# Patient Record
Sex: Female | Born: 1940
Health system: Southern US, Community
[De-identification: ages and names within clinical notes are randomized; demographics above are authoritative.]

## PROBLEM LIST (undated history)

## (undated) DIAGNOSIS — N952 Postmenopausal atrophic vaginitis: Secondary | ICD-10-CM

## (undated) DIAGNOSIS — C801 Malignant (primary) neoplasm, unspecified: Secondary | ICD-10-CM

## (undated) DIAGNOSIS — I341 Nonrheumatic mitral (valve) prolapse: Secondary | ICD-10-CM

## (undated) DIAGNOSIS — F419 Anxiety disorder, unspecified: Secondary | ICD-10-CM

## (undated) DIAGNOSIS — N816 Rectocele: Secondary | ICD-10-CM

## (undated) DIAGNOSIS — G43909 Migraine, unspecified, not intractable, without status migrainosus: Secondary | ICD-10-CM

## (undated) DIAGNOSIS — K2970 Gastritis, unspecified, without bleeding: Secondary | ICD-10-CM

## (undated) DIAGNOSIS — N6012 Diffuse cystic mastopathy of left breast: Secondary | ICD-10-CM

## (undated) DIAGNOSIS — K589 Irritable bowel syndrome without diarrhea: Secondary | ICD-10-CM

## (undated) DIAGNOSIS — D649 Anemia, unspecified: Secondary | ICD-10-CM

## (undated) DIAGNOSIS — N763 Subacute and chronic vulvitis: Secondary | ICD-10-CM

## (undated) DIAGNOSIS — Z8719 Personal history of other diseases of the digestive system: Secondary | ICD-10-CM

## (undated) DIAGNOSIS — K649 Unspecified hemorrhoids: Secondary | ICD-10-CM

## (undated) DIAGNOSIS — R0600 Dyspnea, unspecified: Secondary | ICD-10-CM

## (undated) DIAGNOSIS — Z923 Personal history of irradiation: Secondary | ICD-10-CM

## (undated) DIAGNOSIS — N6011 Diffuse cystic mastopathy of right breast: Secondary | ICD-10-CM

## (undated) DIAGNOSIS — M810 Age-related osteoporosis without current pathological fracture: Secondary | ICD-10-CM

## (undated) DIAGNOSIS — IMO0002 Reserved for concepts with insufficient information to code with codable children: Secondary | ICD-10-CM

## (undated) DIAGNOSIS — N941 Unspecified dyspareunia: Secondary | ICD-10-CM

## (undated) DIAGNOSIS — K5790 Diverticulosis of intestine, part unspecified, without perforation or abscess without bleeding: Secondary | ICD-10-CM

## (undated) DIAGNOSIS — I10 Essential (primary) hypertension: Secondary | ICD-10-CM

## (undated) DIAGNOSIS — N3010 Interstitial cystitis (chronic) without hematuria: Secondary | ICD-10-CM

## (undated) DIAGNOSIS — Z9221 Personal history of antineoplastic chemotherapy: Secondary | ICD-10-CM

## (undated) DIAGNOSIS — I499 Cardiac arrhythmia, unspecified: Secondary | ICD-10-CM

## (undated) DIAGNOSIS — E7800 Pure hypercholesterolemia, unspecified: Secondary | ICD-10-CM

## (undated) DIAGNOSIS — K5792 Diverticulitis of intestine, part unspecified, without perforation or abscess without bleeding: Secondary | ICD-10-CM

## (undated) DIAGNOSIS — C50919 Malignant neoplasm of unspecified site of unspecified female breast: Secondary | ICD-10-CM

## (undated) DIAGNOSIS — I639 Cerebral infarction, unspecified: Secondary | ICD-10-CM

## (undated) DIAGNOSIS — K635 Polyp of colon: Secondary | ICD-10-CM

## (undated) DIAGNOSIS — J45909 Unspecified asthma, uncomplicated: Secondary | ICD-10-CM

## (undated) DIAGNOSIS — K219 Gastro-esophageal reflux disease without esophagitis: Secondary | ICD-10-CM

## (undated) HISTORY — DX: Unspecified dyspareunia: N94.10

## (undated) HISTORY — DX: Interstitial cystitis (chronic) without hematuria: N30.10

## (undated) HISTORY — PX: VAGINAL HYSTERECTOMY: SUR661

## (undated) HISTORY — DX: Malignant neoplasm of unspecified site of unspecified female breast: C50.919

## (undated) HISTORY — DX: Postmenopausal atrophic vaginitis: N95.2

## (undated) HISTORY — DX: Reserved for concepts with insufficient information to code with codable children: IMO0002

## (undated) HISTORY — DX: Irritable bowel syndrome, unspecified: K58.9

## (undated) HISTORY — DX: Age-related osteoporosis without current pathological fracture: M81.0

## (undated) HISTORY — PX: TONSILLECTOMY: SUR1361

## (undated) HISTORY — PX: DILATION AND CURETTAGE OF UTERUS: SHX78

## (undated) HISTORY — PX: COLON SURGERY: SHX602

## (undated) HISTORY — PX: APPENDECTOMY: SHX54

## (undated) HISTORY — DX: Subacute and chronic vulvitis: N76.3

## (undated) HISTORY — PX: OOPHORECTOMY: SHX86

## (undated) HISTORY — PX: CORONARY ANGIOPLASTY: SHX604

## (undated) HISTORY — DX: Pure hypercholesterolemia, unspecified: E78.00

## (undated) HISTORY — DX: Gastro-esophageal reflux disease without esophagitis: K21.9

## (undated) HISTORY — PX: BREAST EXCISIONAL BIOPSY: SUR124

## (undated) HISTORY — DX: Rectocele: N81.6

---

## 1996-03-27 HISTORY — PX: BREAST BIOPSY: SHX20

## 2004-07-15 ENCOUNTER — Ambulatory Visit: Payer: Self-pay | Admitting: Surgery

## 2004-07-29 ENCOUNTER — Ambulatory Visit: Payer: Self-pay | Admitting: Internal Medicine

## 2004-07-29 ENCOUNTER — Ambulatory Visit: Payer: Self-pay | Admitting: Urology

## 2004-08-15 ENCOUNTER — Ambulatory Visit: Payer: Self-pay | Admitting: Internal Medicine

## 2004-08-25 ENCOUNTER — Ambulatory Visit: Payer: Self-pay | Admitting: Internal Medicine

## 2005-03-01 ENCOUNTER — Ambulatory Visit: Payer: Self-pay | Admitting: Surgery

## 2005-03-27 HISTORY — PX: BREAST BIOPSY: SHX20

## 2005-08-22 ENCOUNTER — Ambulatory Visit: Payer: Self-pay | Admitting: Internal Medicine

## 2005-08-25 ENCOUNTER — Ambulatory Visit: Payer: Self-pay | Admitting: Internal Medicine

## 2005-09-18 ENCOUNTER — Ambulatory Visit: Payer: Self-pay | Admitting: Surgery

## 2005-09-21 ENCOUNTER — Other Ambulatory Visit: Payer: Self-pay

## 2005-09-26 ENCOUNTER — Ambulatory Visit: Payer: Self-pay | Admitting: Surgery

## 2006-04-03 ENCOUNTER — Ambulatory Visit: Payer: Self-pay | Admitting: Cardiology

## 2006-08-24 ENCOUNTER — Ambulatory Visit: Payer: Self-pay | Admitting: Internal Medicine

## 2007-01-21 ENCOUNTER — Ambulatory Visit: Payer: Self-pay | Admitting: Surgery

## 2007-08-26 ENCOUNTER — Ambulatory Visit: Payer: Self-pay | Admitting: Surgery

## 2007-12-09 ENCOUNTER — Ambulatory Visit: Payer: Self-pay | Admitting: Internal Medicine

## 2008-07-21 ENCOUNTER — Ambulatory Visit: Payer: Self-pay | Admitting: Internal Medicine

## 2008-08-26 ENCOUNTER — Ambulatory Visit: Payer: Self-pay | Admitting: Surgery

## 2009-07-30 ENCOUNTER — Ambulatory Visit: Payer: Self-pay | Admitting: Internal Medicine

## 2009-09-06 ENCOUNTER — Ambulatory Visit: Payer: Self-pay | Admitting: Internal Medicine

## 2010-04-29 ENCOUNTER — Ambulatory Visit: Payer: Self-pay | Admitting: Surgery

## 2010-05-02 LAB — PATHOLOGY REPORT

## 2010-09-08 ENCOUNTER — Ambulatory Visit: Payer: Self-pay | Admitting: Internal Medicine

## 2011-06-12 ENCOUNTER — Observation Stay: Payer: Self-pay | Admitting: Internal Medicine

## 2011-06-12 LAB — URINALYSIS, COMPLETE
Bilirubin,UR: NEGATIVE
Glucose,UR: NEGATIVE mg/dL (ref 0–75)
Ketone: NEGATIVE
Leukocyte Esterase: NEGATIVE
Nitrite: NEGATIVE
Ph: 7 (ref 4.5–8.0)
Protein: NEGATIVE
RBC,UR: 1 /HPF (ref 0–5)
Specific Gravity: 1.004 (ref 1.003–1.030)
Squamous Epithelial: 2
WBC UR: <1 /HPF (ref 0–5)

## 2011-06-12 LAB — PROTIME-INR
INR: 0.9
Prothrombin Time: 12.4 secs (ref 11.5–14.7)

## 2011-06-12 LAB — COMPREHENSIVE METABOLIC PANEL
Albumin: 3.8 g/dL (ref 3.4–5.0)
Alkaline Phosphatase: 75 U/L (ref 50–136)
Anion Gap: 11 (ref 7–16)
BUN: 11 mg/dL (ref 7–18)
Bilirubin,Total: 0.4 mg/dL (ref 0.2–1.0)
Calcium, Total: 9 mg/dL (ref 8.5–10.1)
Chloride: 99 mmol/L (ref 98–107)
Co2: 28 mmol/L (ref 21–32)
Creatinine: 0.68 mg/dL (ref 0.60–1.30)
EGFR (African American): >60
EGFR (Non-African Amer.): >60
Glucose: 160 mg/dL — ABNORMAL HIGH (ref 65–99)
Osmolality: 278 (ref 275–301)
Potassium: 3.7 mmol/L (ref 3.5–5.1)
SGOT(AST): 47 U/L — ABNORMAL HIGH (ref 15–37)
SGPT (ALT): 40 U/L
Sodium: 138 mmol/L (ref 136–145)
Total Protein: 7.8 g/dL (ref 6.4–8.2)

## 2011-06-12 LAB — HEMOGLOBIN A1C: Hemoglobin A1C: 6.7 % — ABNORMAL HIGH (ref 4.2–6.3)

## 2011-06-12 LAB — CBC
HCT: 38.7 % (ref 35.0–47.0)
HGB: 12.9 g/dL (ref 12.0–16.0)
MCH: 28.5 pg (ref 26.0–34.0)
MCHC: 33.2 g/dL (ref 32.0–36.0)
MCV: 86 fL (ref 80–100)
Platelet: 236 10*3/uL (ref 150–440)
RBC: 4.51 10*6/uL (ref 3.80–5.20)
RDW: 14.2 % (ref 11.5–14.5)
WBC: 5.4 10*3/uL (ref 3.6–11.0)

## 2011-06-12 LAB — TROPONIN I
Troponin-I: <0.02 ng/mL
Troponin-I: <0.02 ng/mL

## 2011-06-12 LAB — CK TOTAL AND CKMB (NOT AT ARMC)
CK, Total: 98 U/L (ref 21–215)
CK, Total: 99 U/L (ref 21–215)
CK-MB: 1.5 ng/mL (ref 0.5–3.6)
CK-MB: 1.6 ng/mL (ref 0.5–3.6)

## 2011-06-13 LAB — CBC WITH DIFFERENTIAL/PLATELET
Basophil #: 0 10*3/uL (ref 0.0–0.1)
Basophil %: 0.5 %
Eosinophil #: 0.2 10*3/uL (ref 0.0–0.7)
Eosinophil %: 2.7 %
HCT: 38.4 % (ref 35.0–47.0)
HGB: 12.7 g/dL (ref 12.0–16.0)
Lymphocyte #: 2.2 10*3/uL (ref 1.0–3.6)
Lymphocyte %: 33.9 %
MCH: 28.5 pg (ref 26.0–34.0)
MCHC: 33 g/dL (ref 32.0–36.0)
MCV: 86 fL (ref 80–100)
Monocyte #: 0.6 10*3/uL (ref 0.0–0.7)
Monocyte %: 8.7 %
Neutrophil #: 3.6 10*3/uL (ref 1.4–6.5)
Neutrophil %: 54.2 %
Platelet: 226 10*3/uL (ref 150–440)
RBC: 4.45 10*6/uL (ref 3.80–5.20)
RDW: 14.3 % (ref 11.5–14.5)
WBC: 6.6 10*3/uL (ref 3.6–11.0)

## 2011-06-13 LAB — TSH: Thyroid Stimulating Horm: 2.46 u[IU]/mL

## 2011-06-13 LAB — BASIC METABOLIC PANEL
Anion Gap: 13 (ref 7–16)
BUN: 11 mg/dL (ref 7–18)
Calcium, Total: 9.2 mg/dL (ref 8.5–10.1)
Chloride: 101 mmol/L (ref 98–107)
Co2: 30 mmol/L (ref 21–32)
Creatinine: 0.7 mg/dL (ref 0.60–1.30)
EGFR (African American): >60
EGFR (Non-African Amer.): >60
Glucose: 97 mg/dL (ref 65–99)
Osmolality: 286 (ref 275–301)
Potassium: 4.1 mmol/L (ref 3.5–5.1)
Sodium: 144 mmol/L (ref 136–145)

## 2011-06-13 LAB — LIPID PANEL
Cholesterol: 196 mg/dL (ref 0–200)
HDL Cholesterol: 34 mg/dL — ABNORMAL LOW (ref 40–60)
Ldl Cholesterol, Calc: 109 mg/dL — ABNORMAL HIGH (ref 0–100)
Triglycerides: 265 mg/dL — ABNORMAL HIGH (ref 0–200)
VLDL Cholesterol, Calc: 53 mg/dL — ABNORMAL HIGH (ref 5–40)

## 2011-06-13 LAB — HEMOGLOBIN A1C: Hemoglobin A1C: 6.8 % — ABNORMAL HIGH (ref 4.2–6.3)

## 2011-06-13 LAB — MAGNESIUM: Magnesium: 1.9 mg/dL

## 2011-06-13 LAB — TROPONIN I: Troponin-I: <0.02 ng/mL

## 2011-06-13 LAB — CK TOTAL AND CKMB (NOT AT ARMC)
CK, Total: 94 U/L (ref 21–215)
CK-MB: 1.7 ng/mL (ref 0.5–3.6)

## 2011-09-19 ENCOUNTER — Ambulatory Visit: Payer: Self-pay | Admitting: Internal Medicine

## 2011-11-11 ENCOUNTER — Inpatient Hospital Stay: Payer: Self-pay | Admitting: Internal Medicine

## 2011-11-11 ENCOUNTER — Ambulatory Visit: Payer: Self-pay | Admitting: Neurology

## 2011-11-11 LAB — TROPONIN I: Troponin-I: <0.02 ng/mL

## 2011-11-11 LAB — SEDIMENTATION RATE: Erythrocyte Sed Rate: 19 mm/hr (ref 0–30)

## 2011-11-11 LAB — URINALYSIS, COMPLETE
Bacteria: NONE SEEN
Bilirubin,UR: NEGATIVE
Blood: NEGATIVE
Glucose,UR: NEGATIVE mg/dL (ref 0–75)
Ketone: NEGATIVE
Leukocyte Esterase: NEGATIVE
Nitrite: NEGATIVE
Ph: 7 (ref 4.5–8.0)
Protein: NEGATIVE
RBC,UR: 2 /HPF (ref 0–5)
Specific Gravity: 1.012 (ref 1.003–1.030)
Squamous Epithelial: 1
WBC UR: 1 /HPF (ref 0–5)

## 2011-11-11 LAB — COMPREHENSIVE METABOLIC PANEL
Albumin: 3.6 g/dL (ref 3.4–5.0)
Alkaline Phosphatase: 90 U/L (ref 50–136)
Anion Gap: 6 — ABNORMAL LOW (ref 7–16)
BUN: 11 mg/dL (ref 7–18)
Bilirubin,Total: 0.4 mg/dL (ref 0.2–1.0)
Calcium, Total: 9.2 mg/dL (ref 8.5–10.1)
Chloride: 103 mmol/L (ref 98–107)
Co2: 30 mmol/L (ref 21–32)
Creatinine: 0.79 mg/dL (ref 0.60–1.30)
EGFR (African American): >60
EGFR (Non-African Amer.): >60
Glucose: 119 mg/dL — ABNORMAL HIGH (ref 65–99)
Osmolality: 278 (ref 275–301)
Potassium: 3.9 mmol/L (ref 3.5–5.1)
SGOT(AST): 62 U/L — ABNORMAL HIGH (ref 15–37)
SGPT (ALT): 48 U/L (ref 12–78)
Sodium: 139 mmol/L (ref 136–145)
Total Protein: 7.7 g/dL (ref 6.4–8.2)

## 2011-11-11 LAB — CBC
HCT: 38.5 % (ref 35.0–47.0)
HGB: 12.7 g/dL (ref 12.0–16.0)
MCH: 27.7 pg (ref 26.0–34.0)
MCHC: 32.9 g/dL (ref 32.0–36.0)
MCV: 84 fL (ref 80–100)
Platelet: 244 10*3/uL (ref 150–440)
RBC: 4.57 10*6/uL (ref 3.80–5.20)
RDW: 14.3 % (ref 11.5–14.5)
WBC: 5.5 10*3/uL (ref 3.6–11.0)

## 2011-11-11 LAB — PROTIME-INR
INR: 0.9
Prothrombin Time: 12.3 secs (ref 11.5–14.7)

## 2011-11-11 LAB — TSH: Thyroid Stimulating Horm: 0.684 u[IU]/mL

## 2011-11-11 LAB — MAGNESIUM: Magnesium: 2 mg/dL

## 2011-11-11 LAB — APTT: Activated PTT: 31.2 secs (ref 23.6–35.9)

## 2011-11-12 LAB — CBC WITH DIFFERENTIAL/PLATELET
Basophil #: 0 10*3/uL (ref 0.0–0.1)
Basophil %: 0.3 %
Eosinophil #: 0.1 10*3/uL (ref 0.0–0.7)
Eosinophil %: 2.9 %
HCT: 34.3 % — ABNORMAL LOW (ref 35.0–47.0)
HGB: 11.1 g/dL — ABNORMAL LOW (ref 12.0–16.0)
Lymphocyte #: 1.6 10*3/uL (ref 1.0–3.6)
Lymphocyte %: 33.8 %
MCH: 27.6 pg (ref 26.0–34.0)
MCHC: 32.4 g/dL (ref 32.0–36.0)
MCV: 85 fL (ref 80–100)
Monocyte #: 0.4 x10 3/mm (ref 0.2–0.9)
Monocyte %: 8.9 %
Neutrophil #: 2.6 10*3/uL (ref 1.4–6.5)
Neutrophil %: 54.1 %
Platelet: 189 10*3/uL (ref 150–440)
RBC: 4.03 10*6/uL (ref 3.80–5.20)
RDW: 14 % (ref 11.5–14.5)
WBC: 4.7 10*3/uL (ref 3.6–11.0)

## 2011-11-12 LAB — BASIC METABOLIC PANEL
Anion Gap: 5 — ABNORMAL LOW (ref 7–16)
BUN: 12 mg/dL (ref 7–18)
Calcium, Total: 8.3 mg/dL — ABNORMAL LOW (ref 8.5–10.1)
Chloride: 109 mmol/L — ABNORMAL HIGH (ref 98–107)
Co2: 29 mmol/L (ref 21–32)
Creatinine: 0.86 mg/dL (ref 0.60–1.30)
EGFR (African American): >60
EGFR (Non-African Amer.): >60
Glucose: 112 mg/dL — ABNORMAL HIGH (ref 65–99)
Osmolality: 285 (ref 275–301)
Potassium: 3.9 mmol/L (ref 3.5–5.1)
Sodium: 143 mmol/L (ref 136–145)

## 2011-11-12 LAB — HEPATIC FUNCTION PANEL A (ARMC)
Albumin: 2.9 g/dL — ABNORMAL LOW (ref 3.4–5.0)
Alkaline Phosphatase: 78 U/L (ref 50–136)
Bilirubin, Direct: <0.1 mg/dL (ref 0.00–0.20)
Bilirubin,Total: 0.2 mg/dL (ref 0.2–1.0)
SGOT(AST): 39 U/L — ABNORMAL HIGH (ref 15–37)
SGPT (ALT): 36 U/L (ref 12–78)
Total Protein: 6.2 g/dL — ABNORMAL LOW (ref 6.4–8.2)

## 2011-11-12 LAB — LIPID PANEL
Cholesterol: 162 mg/dL (ref 0–200)
HDL Cholesterol: 34 mg/dL — ABNORMAL LOW (ref 40–60)
Ldl Cholesterol, Calc: 98 mg/dL (ref 0–100)
Triglycerides: 149 mg/dL (ref 0–200)
VLDL Cholesterol, Calc: 30 mg/dL (ref 5–40)

## 2011-11-23 ENCOUNTER — Inpatient Hospital Stay: Payer: Self-pay

## 2011-11-23 LAB — URINALYSIS, COMPLETE
Bilirubin,UR: NEGATIVE
Blood: NEGATIVE
Glucose,UR: NEGATIVE mg/dL (ref 0–75)
Ketone: NEGATIVE
Leukocyte Esterase: NEGATIVE
Nitrite: NEGATIVE
Ph: 8 (ref 4.5–8.0)
Protein: NEGATIVE
RBC,UR: 3 /HPF (ref 0–5)
Specific Gravity: 1.013 (ref 1.003–1.030)
Squamous Epithelial: 7
WBC UR: 4 /HPF (ref 0–5)

## 2011-11-23 LAB — CBC WITH DIFFERENTIAL/PLATELET
Basophil #: 0 10*3/uL (ref 0.0–0.1)
Basophil %: 0.3 %
Eosinophil #: 0.2 10*3/uL (ref 0.0–0.7)
Eosinophil %: 1.4 %
HCT: 37.4 % (ref 35.0–47.0)
HGB: 12.6 g/dL (ref 12.0–16.0)
Lymphocyte #: 1.7 10*3/uL (ref 1.0–3.6)
Lymphocyte %: 12.9 %
MCH: 28.1 pg (ref 26.0–34.0)
MCHC: 33.6 g/dL (ref 32.0–36.0)
MCV: 84 fL (ref 80–100)
Monocyte #: 1.1 x10 3/mm — ABNORMAL HIGH (ref 0.2–0.9)
Monocyte %: 8.3 %
Neutrophil #: 10.1 10*3/uL — ABNORMAL HIGH (ref 1.4–6.5)
Neutrophil %: 77.1 %
Platelet: 249 10*3/uL (ref 150–440)
RBC: 4.48 10*6/uL (ref 3.80–5.20)
RDW: 14.5 % (ref 11.5–14.5)
WBC: 13.1 10*3/uL — ABNORMAL HIGH (ref 3.6–11.0)

## 2011-11-23 LAB — COMPREHENSIVE METABOLIC PANEL
Albumin: 3.7 g/dL (ref 3.4–5.0)
Alkaline Phosphatase: 97 U/L (ref 50–136)
Anion Gap: 10 (ref 7–16)
BUN: 10 mg/dL (ref 7–18)
Bilirubin,Total: 0.5 mg/dL (ref 0.2–1.0)
Calcium, Total: 9.3 mg/dL (ref 8.5–10.1)
Chloride: 98 mmol/L (ref 98–107)
Co2: 27 mmol/L (ref 21–32)
Creatinine: 0.81 mg/dL (ref 0.60–1.30)
EGFR (African American): >60
EGFR (Non-African Amer.): >60
Glucose: 124 mg/dL — ABNORMAL HIGH (ref 65–99)
Osmolality: 271 (ref 275–301)
Potassium: 3.9 mmol/L (ref 3.5–5.1)
SGOT(AST): 31 U/L (ref 15–37)
SGPT (ALT): 37 U/L (ref 12–78)
Sodium: 135 mmol/L — ABNORMAL LOW (ref 136–145)
Total Protein: 7.7 g/dL (ref 6.4–8.2)

## 2011-11-23 LAB — LIPASE, BLOOD: Lipase: 171 U/L (ref 73–393)

## 2011-11-24 LAB — CBC WITH DIFFERENTIAL/PLATELET
Basophil #: 0 10*3/uL (ref 0.0–0.1)
Basophil %: 0.3 %
Eosinophil #: 0.2 10*3/uL (ref 0.0–0.7)
Eosinophil %: 2.4 %
HCT: 34.4 % — ABNORMAL LOW (ref 35.0–47.0)
HGB: 11.1 g/dL — ABNORMAL LOW (ref 12.0–16.0)
Lymphocyte #: 2 10*3/uL (ref 1.0–3.6)
Lymphocyte %: 25.5 %
MCH: 27.5 pg (ref 26.0–34.0)
MCHC: 32.3 g/dL (ref 32.0–36.0)
MCV: 85 fL (ref 80–100)
Monocyte #: 0.6 x10 3/mm (ref 0.2–0.9)
Monocyte %: 8.2 %
Neutrophil #: 5 10*3/uL (ref 1.4–6.5)
Neutrophil %: 63.6 %
Platelet: 187 10*3/uL (ref 150–440)
RBC: 4.04 10*6/uL (ref 3.80–5.20)
RDW: 14.2 % (ref 11.5–14.5)
WBC: 7.9 10*3/uL (ref 3.6–11.0)

## 2011-11-29 LAB — CULTURE, BLOOD (SINGLE)

## 2012-01-18 ENCOUNTER — Ambulatory Visit: Payer: Self-pay | Admitting: Cardiothoracic Surgery

## 2012-02-28 ENCOUNTER — Other Ambulatory Visit: Payer: Self-pay | Admitting: Gastroenterology

## 2012-02-28 LAB — CLOSTRIDIUM DIFFICILE BY PCR

## 2012-03-27 ENCOUNTER — Ambulatory Visit: Payer: Self-pay | Admitting: Cardiothoracic Surgery

## 2012-04-18 ENCOUNTER — Ambulatory Visit: Payer: Self-pay | Admitting: Cardiothoracic Surgery

## 2012-04-27 ENCOUNTER — Ambulatory Visit: Payer: Self-pay | Admitting: Cardiothoracic Surgery

## 2012-09-19 ENCOUNTER — Ambulatory Visit: Payer: Self-pay | Admitting: Internal Medicine

## 2012-10-10 ENCOUNTER — Ambulatory Visit: Payer: Self-pay | Admitting: Cardiothoracic Surgery

## 2012-10-15 ENCOUNTER — Ambulatory Visit: Payer: Self-pay | Admitting: Cardiothoracic Surgery

## 2013-04-10 ENCOUNTER — Ambulatory Visit: Payer: Self-pay | Admitting: Cardiothoracic Surgery

## 2013-09-24 ENCOUNTER — Ambulatory Visit: Payer: Self-pay | Admitting: Internal Medicine

## 2013-10-14 ENCOUNTER — Ambulatory Visit: Payer: Self-pay | Admitting: Internal Medicine

## 2014-02-02 DIAGNOSIS — K589 Irritable bowel syndrome without diarrhea: Secondary | ICD-10-CM | POA: Insufficient documentation

## 2014-05-05 DIAGNOSIS — I341 Nonrheumatic mitral (valve) prolapse: Secondary | ICD-10-CM | POA: Diagnosis not present

## 2014-05-05 DIAGNOSIS — M542 Cervicalgia: Secondary | ICD-10-CM | POA: Diagnosis not present

## 2014-05-05 DIAGNOSIS — E782 Mixed hyperlipidemia: Secondary | ICD-10-CM | POA: Diagnosis not present

## 2014-05-05 DIAGNOSIS — N816 Rectocele: Secondary | ICD-10-CM | POA: Diagnosis not present

## 2014-05-26 DIAGNOSIS — N8110 Cystocele, unspecified: Secondary | ICD-10-CM | POA: Diagnosis not present

## 2014-05-26 DIAGNOSIS — N3946 Mixed incontinence: Secondary | ICD-10-CM | POA: Diagnosis not present

## 2014-05-26 DIAGNOSIS — N816 Rectocele: Secondary | ICD-10-CM | POA: Diagnosis not present

## 2014-05-26 DIAGNOSIS — N941 Dyspareunia: Secondary | ICD-10-CM | POA: Diagnosis not present

## 2014-06-09 DIAGNOSIS — M62838 Other muscle spasm: Secondary | ICD-10-CM | POA: Diagnosis not present

## 2014-06-09 DIAGNOSIS — N952 Postmenopausal atrophic vaginitis: Secondary | ICD-10-CM | POA: Diagnosis not present

## 2014-06-09 DIAGNOSIS — N941 Dyspareunia: Secondary | ICD-10-CM | POA: Diagnosis not present

## 2014-06-15 DIAGNOSIS — Z79899 Other long term (current) drug therapy: Secondary | ICD-10-CM | POA: Diagnosis not present

## 2014-06-15 DIAGNOSIS — M47817 Spondylosis without myelopathy or radiculopathy, lumbosacral region: Secondary | ICD-10-CM | POA: Diagnosis not present

## 2014-06-15 DIAGNOSIS — M545 Low back pain: Secondary | ICD-10-CM | POA: Diagnosis not present

## 2014-06-15 DIAGNOSIS — E782 Mixed hyperlipidemia: Secondary | ICD-10-CM | POA: Diagnosis not present

## 2014-06-15 DIAGNOSIS — M533 Sacrococcygeal disorders, not elsewhere classified: Secondary | ICD-10-CM | POA: Diagnosis not present

## 2014-06-23 DIAGNOSIS — M62838 Other muscle spasm: Secondary | ICD-10-CM | POA: Diagnosis not present

## 2014-06-23 DIAGNOSIS — N941 Dyspareunia: Secondary | ICD-10-CM | POA: Diagnosis not present

## 2014-06-23 DIAGNOSIS — N952 Postmenopausal atrophic vaginitis: Secondary | ICD-10-CM | POA: Diagnosis not present

## 2014-06-23 DIAGNOSIS — M791 Myalgia: Secondary | ICD-10-CM | POA: Diagnosis not present

## 2014-07-14 NOTE — Discharge Summary (Signed)
PATIENT NAME:  Doris Barnes, Doris Barnes MR#:  [id number] DATE OF BIRTH:  [birth year]  DATE OF ADMISSION:  11/11/2011 DATE OF DISCHARGE:  11/12/2011  FINAL DIAGNOSES:  1. Benign paroxysmal positional vertigo.  2. Hypertension.  3. Gastroesophageal reflux disease.  4. Hyperlipidemia.  5. Anxiety.   HISTORY AND PHYSICAL: Please see dictated admission history and physical.   [place]: The patient was admitted with findings of vertigo and evidence on CT scan of right cerebellar cerebrovascular infarction. She had had evaluation including echocardiogram, carotid Doppler's, and MRA as well as MRI of the brain in March 2013 when she came in with similar symptoms, and these were all negative. She underwent MRI to further investigate the new findings on CT, and there was no evidence of acute stroke on the MRI. There was also no significant evidence of small vessel disease or any other potential transient ischemic attacks or stroke etiology. MRA of the neck was also performed, which showed no significant lesions in the carotids. She was followed on monitor, and no significant rhythm problems were noted.   Neurology saw the patient and re-evaluated her CT scan. They felt that this was likely artifact and that she had not had a stroke which was discussed further with the patient. Physical therapy saw the patient and did perform Dix-Hallpike maneuvers versus potential benign paroxysmal positional vertigo, and these were positive for vertiginous findings. The patient was ambulating with a walker and felt significantly improved with the use of meclizine as needed. We will plan for her to follow-up with physical therapy as an outpatient to do vestibular rehabilitation. At this time she is discharged home in stable condition with physical activity to be up with a walker as needed and as tolerated. She will follow up with Dr. Ginette Pitman within the next one week. Her diet can be regular, eventually will reduce this to  low sodium once it is clear that she is taking oral intake better.   DISCHARGE MEDICATIONS:  1. Aspirin 81 mg p.o. daily.  2. Atenolol 50 mg p.o. daily.  3. Simvastatin 40 mg p.o. at bedtime.  4. Prevacid 30 mg p.o. daily.     ____________________________ Adin Hector, MD bjk:ap D: 11/12/2011 15:35:22 ET T: 11/13/2011 11:32:10 ET JOB#: [id number]  cc: Tama High III, MD, <Dictator> Ramonita Lab MD ELECTRONICALLY SIGNED 11/14/2011 13:23

## 2014-07-14 NOTE — Consult Note (Signed)
PATIENT NAME:  AMANDEEP, HOGSTON MR#:  [id number] DATE OF BIRTH:  [birth year]  DATE OF CONSULTATION:  11/11/2011  REFERRING PHYSICIAN:   Dr. Tracie Harrier CONSULTING PHYSICIAN:  Vitor Overbaugh B. Jamari Moten, MD  REASON FOR CONSULTATION: Stroke.    HISTORY OF PRESENT ILLNESS: Ms. Kary is a 74 year old, right-handed female with a past medical history significant for hypertension and hyperlipidemia who presents this morning with the sudden onset of vertigo, nausea, and gait instability. She has had several episodes similar to this in the past. The episodes in the past were shorter and not as severe as the one today. Her first episode was in March 2013 and she was admitted here for work-up for that. At that time she had an MRI of her brain, MRA of the head, carotid Dopplers and echocardiogram, which were all unremarkable. Since that time she has had a few other episodes, but they lasted only a matter of minutes. The episode today was much more severe and has lasted all day. In the Emergency Department she received antiemetic and Antivert and this has been helpful. She denies any recent trauma or falls. Of note, she does experience frequent heart palpitations and heart fluttering. She did have an outpatient Holter monitor few months ago. The result of this is unclear. She said that she did not experience any of the palpitations during that recording period. Head CT in the Emergency Department showed a right cerebellar hypodensity and she was admitted for further work-up.   REVIEW OF SYSTEMS: A complete 14-point review of systems is negative other than what is mentioned in the history of present illness.   PAST MEDICAL HISTORY: Hypertension, hyperlipidemia, irritable bowel syndrome, interstitial cystitis, migraines, history of squamous cell carcinoma of the nose, history of osteoporosis, status post hysterectomy with bilateral salpingo-oophorectomy, history of appendectomy.   ALLERGIES: Sulfa, imipramine?, Lipitor,  Keflex, codeine, epinephrine, Reglan.   HOME MEDICATIONS:  1. Aspirin 81 mg  daily.  2. Calcium.  3. Atenolol.  4. Simvastatin 20 mg at bedtime.  5. Prevacid.   SOCIAL HISTORY: She quit smoking in 1989. She denies alcohol or illicits. She lives with her husband.   FAMILY HISTORY: Diabetes, colon cancer, and heart failure.   PHYSICAL EXAMINATION:  VITAL SIGNS: Temperature 96, pulse 81, blood pressure 160/85, weight 58.5 kg.   GENERAL: Well, no apparent distress.   CARDIOVASCULAR: Regular rate and rhythm.   RESPIRATORY: Clear to auscultation anteriorly.   ABDOMEN: Soft.   SKIN: No rashes or lesions.   EXTREMITIES: No edema. 2+ pulses throughout.   MENTAL STATUS: . She is alert and oriented times four, name repetition intact, speech is fluent, no dysarthria.   Cranial nerves: Pupils equal, round, and reactive to light. Full visual fields. Extraocular movements are intact. No nystagmus. No facial sensory deficits. Facial expressions are symmetric. Shoulder shrug is five out of five bilaterally. Tongue midline.   Motor: No pronator drift. Strength is five out of five throughout.   Deep tendon reflexes:  2+/4 throughout with toes downgoing bilaterally.   Coordination: Very mild ataxia with finger-to-nose on the right. Left finger-to-nose is normal. Heel-to-shin is normal bilaterally.  Sensation: No deficits to light touch or temperature throughout.   Gait: Her gait is mildly ataxic and unsteady at times. She is able to ambulate unassisted.   IMAGING: Noncontrast head CT shows two small right cerebellar hypodensities.   LABORATORY DATA:  BMP, LFTs, and CBC within normal limits.  Troponin is negative.   ASSESSMENT AND PLAN: Ms. Tanveer Dobberstein  is a 74 year old female with a past medical history significant for hypertension and hyperlipidemia who presents with an acute right cerebellar ischemic stroke. Her risk factors for stroke include hypertension and hyperlipidemia.    RECOMMENDATIONS:   1. Obtain MRI of the brain without contrast.  2. Obtain MRA of the neck with fat suppression to rule out dissection.  3. Check a lipid panel, homocystine, and lipoprotein A.  4. Continue on telemetry while an inpatient.  5. Recommend repeat outpatient Holter monitor since the last episode of Holter monitoring did not  include her episodes of palpitations or fluttering.  6. Physical and occupational therapy.  7. Bedside swallow evaluation.  8. Continue Antivert p.r.n.   9. She is probably at low risk to develop cerebral edema. However, patients with cerebellar strokes can decline rapidly if cerebral edema does develop. Recommend frequent neuro checks. Obtain stat head CT and page neurology for any change in mental status or any change in neuro exam.  10. Agree with changing aspirin to Plavix.  11. Increase simvastatin to 40 mg daily.   Thank you for this consultation. We will continue to follow.  ____________________________ Micheline Maze Aalaysia Liggins, MD cbs:bjt D: 11/11/2011 13:52:24 ET T: 11/11/2011 14:53:00 ET JOB#: [id number]  cc: Jiles Goya B. Steele Berg, MD, <Dictator> Arelia Sneddon MD ELECTRONICALLY SIGNED 11/12/2011 12:23

## 2014-07-14 NOTE — Discharge Summary (Signed)
PATIENT NAME:  Doris Barnes, Doris Barnes MR#:  [id number] DATE OF BIRTH:  09/09/1940  DATE OF ADMISSION:  11/23/2011 DATE OF DISCHARGE:  11/25/2011  PRIMARY CARE PHYSICIAN: Dr. Tracie Harrier    DISCHARGE DIAGNOSES:  1. Acute diverticulitis.  2. BPPV.  3. Pulmonary nodules 4. Hyperlipidemia.  5. History of irritable bowel syndrome.  6. Interstitial cystitis.  7. Migraine headache.  8. Osteoporosis.  9. Hypertension.  PAST SURGICAL HISTORY: Hysterectomy.  DISCHARGE MEDICATIONS:  1. Metronidazole 500 mg t.i.d.  2. Ciprofloxacin 500 mg b.i.d.  3. Zofran 4 mg q. 6 hours p.r.n.  4. Xanax 0.25 mg at bedtime p.r.n.  5. Atenolol 50 mg daily.  6. Fluoxetine 20 mg daily.  7. HCTZ 12.5 mg daily.  8. Aspirin 81 mg daily.  9. Simvastatin 40 mg daily.  10. Prevacid 30 mg daily.   HISTORY OF PRESENT ILLNESS: This is a 74 year old female who presented to the ED with lower abdominal pain for the prior 24 hours with associated nausea. She had had no fever or diarrhea. CT scan performed in the ED showed acute diverticulitis.   HOSPITAL COURSE:  1. Acute diverticulitis. The patient was admitted and started on IV antibiotics, IV fluids, and given as needed antiemetics and analgesics. Her pain improved in the first 24 hours and nausea also improved. IV antibiotics were stopped and she was placed on oral antibiotics and her diet was advanced. She tolerated this as well.  2. Dizziness. She had chronic dizziness with a history of BPPV. She had been following with neurology and ENT for this problem. She had ongoing dizziness throughout the admission. She expressed concern that she would not be able to return to work due to her ongoing dizziness and nausea. She had otherwise uneventful course.  3. Pulmonary nodules. Her CT scan showed some incidental pulmonary nodules, the largest of which measured 4 millimeters. It is recommended a repeat imaging study be performed in 6 to 12 months.     RADIOLOGY/LABORATORY  DATA:  CT abdomen/pelvis on 11/23/2011 showed incidental small 2 to 3-mm nodules in the right lower lobe of the lung as well as a 4-mm nodule in the lingula. Mild thickening of the gastric wall was likely secondary to underdistention. Diverticulosis was present in the sigmoid colon with mild adjacent inflammatory changes. Mild bowel wall thickening of the sigmoid colon was present. Findings were consistent with acute sigmoid diverticulitis.     08/30/32013 laboratory:  WBC 7.9, hemoglobin 11.1, hematocrit 34.4. Blood cultures negative times two. Urinalysis negative.    DISCHARGE INSTRUCTIONS:  1. Take antibiotics as written for the next nine days to complete a total of a 10-day course.  2. Zofran to be used intermittently as needed for nausea.  3. Xanax to be used intermittently as needed for poor sleep and anxiety.  4. Follow up with primary care provider for pulmonary nodules and ongoing dizziness as well as potential need for repeat colonoscopy. It should be noted that she had a colonoscopy within the last year by Dr. Tamala Julian, which did show diverticulosis.  5. Follow up with ENT as scheduled on 11/28/2011 for further treatment of BPPV.  6. Recommend she not return to work until after her next evaluation on 11/28/2011 with ENT.  ____________________________ A. Lavone Orn, MD ams:bjt D:  11/25/2011 11:05:40 ET          T: 11/28/2011 10:43:11 ET        JOB#: [id number]  cc: [facility] Tracie Harrier, MD Sherlon Handing MD ELECTRONICALLY  SIGNED 11/29/2011 17:22

## 2014-07-14 NOTE — H&P (Signed)
PATIENT NAME:  Doris Barnes, Doris Barnes MR#:  [id number] DATE OF BIRTH:  12-Jun-1940  DATE OF ADMISSION:  11/23/2011  REFERRING PHYSICIAN: ER physician, Dr. Owens Shark   PRIMARY CARE PHYSICIAN: Dr. Ginette Pitman   CHIEF COMPLAINT: Nausea, abdominal pain.   HISTORY OF PRESENT ILLNESS: The patient is a 74 year old female with past medical history of hypertension, hyperlipidemia, and IBS who had a colonoscopy about a year ago by Dr. Rochel Brome which showed diverticulosis. She recently had some nuts and popcorn and subsequently developed lower abdominal pain which got really worse last night described as stabbing pain in her left lower quadrant associated with some nausea. She denied any fever or diarrhea. She came to the ER for evaluation and was found to have acute diverticulitis on CAT scan. She is being admitted to the hospital for further management.   PAST MEDICAL HISTORY:  1. Hypertension. 2. Hyperlipidemia. 3. Irritable bowel syndrome.   4. Interstitial cystitis. 5. Migraine.  6. Squamous cell cancer of the nose.  7. Osteoporosis.  8. Diverticulosis per colonoscopy done a year ago by Dr. Tamala Julian.   PAST SURGICAL HISTORY:  1. Hysterectomy with bilateral salpingo-oophorectomy. 2. Appendectomy.   ALLERGIES: Sulfa, imipramine, Lipitor, Keflex, codeine, epinephrine, Reglan.   CURRENT MEDICATIONS:  1. Aspirin 81 mg daily.  2. Melatonin 2 to 3 mg at bedtime for insomnia.  3. Fluoxetine 20 mg daily.  4. HCTZ 12.5 mg daily. 5. Calcium 500 mg daily.  6. Atenolol 50 mg daily.  7. Simvastatin 20 mg daily.  8. Prevacid 30 mg daily.   SOCIAL HISTORY: The patient quit smoking several years ago. There is no history of alcohol or drug abuse. She is married, lives with her husband.   FAMILY HISTORY: Positive for diabetes, colon cancer, and heart failure.   REVIEW OF SYSTEMS: CONSTITUTIONAL: Denies any fever, fatigue, weakness. EYES: Denies any blurred or double vision. ENT: Denies any tinnitus, ear pain.  RESPIRATORY: Denies any cough, wheezing. CARDIOVASCULAR: Denies any chest pain, palpitations. GI: Reports nausea and left lower quadrant abdominal pain. Denies any diarrhea. GU: Denies any dysuria or hematuria. ENDOCRINE: Denies any polyuria or nocturia. HEME/LYMPH: Denies any anemia or easy bruisability. MUSCULOSKELETAL: Denies any swelling, gout. NEUROLOGICAL: Denies any numbness, weakness. PSYCH: Has history of anxiety, depression.   PHYSICAL EXAMINATION:   VITAL SIGNS: Temperature 97.8, heart rate 90, respiratory rate 18, blood pressure 134/69, pulse oximetry 95% on room air.   GENERAL: The patient is a 74 year old Caucasian female who is sitting in bed not in acute distress.   HEAD: Atraumatic, normocephalic.   EYES: There is no pallor, icterus, or cyanosis. Pupils equal, round, and reactive to light and accommodation. Extraocular movements intact.    ENT: Wet mucous membranes. No oropharyngeal erythema or thrush.   NECK: Supple. No masses. No JVD. No thyromegaly or lymphadenopathy.   CHEST WALL: No tenderness to palpation. Not using accessory muscles of respiration. No intercostal muscle retractions.   LUNGS: Bilaterally clear to auscultation. No wheezing, rales, or rhonchi.   CARDIOVASCULAR: S1, S2 regular. No murmurs, rubs, or gallops.    ABDOMEN: Soft with no guarding or rigidity. Normal bowel sounds. The patient has tenderness to palpation in her left lower quadrant.   SKIN: No rashes or lesions.   PERIPHERIES: No pedal edema. 2+ pedal pulses.   MUSCULOSKELETAL: No cyanosis, no clubbing.   NEUROLOGIC: Awake, alert, oriented x3. Nonfocal neurological exam. Cranial nerves grossly intact.   PSYCH: Normal mood and affect.   LABORATORY, DIAGNOSTIC, AND RADIOLOGICAL DATA: CT of  the abdomen shows acute sigmoid diverticulitis, multiple indeterminate pulmonary nodules the largest of which measures 4 mm.  Urinalysis shows no evidence of infection. White count of 13.1, hemoglobin  12.6, hematocrit 37.4, platelet count 249. Lipase 171. Glucose 124, BUN 10, creatinine 0.81, sodium 135, potassium 3.9. Rest of CMP is normal.   ASSESSMENT AND PLAN: This is a 74 year old female with past medical history of hypertension, hyperlipidemia, and migraines who presents with left lower quadrant pain and nausea.  1. Acute sigmoid diverticulosis colitis possibly precipitated by the patient's recent intake of popcorn and nuts. Will admit the patient to the hospital. Start her on a clear liquid diet. IV fluids provided with p.r.n. antiemetics and analgesic medications. Will start her on empiric antibiotics of Cipro and Flagyl. If the patient's condition does not improve, will consider GI or surgical evaluation.  2. Leukocytosis possibly due to above. Will monitor closely.  3. Hyperglycemia, possibly mild and reactive.  4. Lung nodules on CT of the abdomen. The patient will benefit from outpatient surveillance CT in 3 to 6 months.  5. Hypertension, appears to be controlled at present. Will continue the patient's HCTZ and atenolol.  6. Hyperlipidemia. Continue statin therapy.  7. History of gastroesophageal reflux disease. Continue PPI.  8. History of depression. Continue fluoxetine.   Reviewed all medical records, discussed with the patient the plan of care and management.   TIME SPENT: 75 minutes.   ____________________________ Cherre Huger, MD sp:drc D: 11/23/2011 11:21:33 ET T: 11/23/2011 11:41:43 ET JOB#: [id number]  cc: Cherre Huger, MD, <Dictator> Tracie Harrier, MD  Cherre Huger MD ELECTRONICALLY SIGNED 11/23/2011 12:19

## 2014-07-14 NOTE — H&P (Signed)
PATIENT NAME:  Doris Barnes, Doris Barnes MR#:  [id number] DATE OF BIRTH:  [birth year]  DATE OF ADMISSION:  11/11/2011  CHIEF COMPLAINT: Dizziness and vertigo.   HISTORY OF PRESENT ILLNESS: 74 year old female with a history of hypertension and hyperlipidemia who was hospitalized in March 2013 with complaints of dizziness. At that time she underwent MRI, MRA, carotid Dopplers, and echocardiogram, all of which were unremarkable. Over the last couple of days she has had some episodes of dizziness, worse when she bends forward, usually worse in the morning, which then improve over the course of the day. This morning she had severe dizziness which was associated with nausea, vomiting, vertigo, and presented to the Emergency Room. She underwent CT scan which reveals a new right cerebellar low attenuation lesion, consistent with acute versus subacute cerebral infarction. She was given Zofran and Antivert, with some improvement. She still feels somewhat dizzy. She is admitted now with acute cerebrovascular accident in a patient already on aspirin who has had extensive evaluation for prior transient ischemic attack symptoms.   PAST MEDICAL HISTORY:  1. Hypertension.  2. Hyperlipidemia.  3. Irritable bowel syndrome.  4. Interstitial cystitis.  5. Migraines.  6. History of squamous cell carcinoma of the nose.  7. History of osteoporosis.  8. Status post hysterectomy with bilateral salpingo-oophorectomy.  9. History of appendectomy.   ALLERGIES: Sulfa, imipramine, Lipitor, Keflex, codeine, epinephrine, Reglan.   MEDICATIONS:  1. Aspirin 81 mg p.o. daily.  2. Calcium 500 mg p.o. daily. 3. Atenolol 50 mg p.o. daily.  4. Simvastatin 20 mg p.o. at bedtime.  5. Prevacid 30 mg p.o. daily.   SOCIAL HISTORY: Remote tobacco. No alcohol.   FAMILY HISTORY: Diabetes, colon cancer, heart failure.   REVIEW OF SYSTEMS: Please see history of present illness. Felt chilled. No fevers. Generalized malaise. Some headache, better  now. No neck stiffness. Denies chest pain. Occasional palpitations. Remainder of complete review of systems is negative.   PHYSICAL EXAMINATION:  VITAL SIGNS: Temperature 96, pulse 81, blood pressure 160/85,  weight 58.5 kg.   GENERAL: Thin female, in no distress.   EYES: Pupils round and reactive to light. Lids and lids conjunctivae unremarkable. Extraocular motion is intact, without nystagmus.   EAR, NOSE, AND THROAT: TMs clear bilaterally. Oropharynx moist without lesions. No facial droop.   NECK: Supple. Trachea midline. No thyromegaly.   CARDIOVASCULAR: Regular rate and rhythm without murmurs, gallops, or rubs. Carotid and radial pulses 2+.   LUNGS: Clear bilaterally. No retractions.   ABDOMEN: Soft, nontender, nondistended. Positive bowel sounds. No guard or rebound.   SKIN: No significant rashes or nodules.   LYMPH NODES: No cervical or supraclavicular nodes.   MUSCULOSKELETAL: No clubbing or cyanosis. No edema.   NEUROLOGIC: Cranial nerves appear to be intact. May be minimal left pronator drift. Finger-to-nose intact. Heel-to-shin is also intact, but somewhat more unsteady with right heel to left leg. Motor strength appears symmetrical otherwise and gait is not tested.  LABORATORY, DIAGNOSTIC, AND RADIOLOGICAL DATA: Cardiac enzymes negative. CT reveals a low attenuation lesion within the right cerebellum, as noted above. Glucose 119, creatinine 0.79. AST 62. Remainder of liver enzymes normal. White count normal.   IMPRESSION AND PLAN:  1. Acute cerebellar stroke. Would not repeat her MRA, carotid Dopplers, or echocardiogram, given these were recently done. However, we will repeat MRI to further evaluate the lesion. I will ask neurology to see the patient as well.  Change aspirin to Plavix. Attempt to increase simvastatin to 40 mg to further lower  LDL, which has remained greater than 100, and discussed potential side effects similar to what she had with Lipitor, which was  gastrointestinal in nature, and she is comfortable trying this. Allow blood pressure to run slightly high for now. Continue her home atenolol dose. We will send TSH, ESR, and phospholipid syndrome looking for any other potential sources for her higher blood pressure, hypercoagulable state, etc.  2. Hyperglycemia. Following this for now. No history of diabetes.  3. Elevated liver enzymes. We will repeat in the a.m. and determine whether further work-up is needed.     ____________________________ Adin Hector, MD bjk:bjt D: 11/11/2011 11:26:33 ET T: 11/11/2011 11:58:27 ET JOB#: [id number]  cc: Adin Hector, MD, <Dictator> Ramonita Lab MD ELECTRONICALLY SIGNED 11/14/2011 13:23

## 2014-07-19 NOTE — H&P (Signed)
PATIENT NAME:  Doris Barnes, BOUTIN MR#:  [id number] DATE OF BIRTH:  [birth year]  DATE OF ADMISSION:  06/12/2011  PRIMARY CARE PHYSICIAN:  Dr Ginette Pitman   REFERRING PHYSICIAN: ER physician,  Dr Randie Heinz  CHIEF COMPLAINT: Difficulty walking.   HISTORY OF PRESENT ILLNESS: The patient is a 74 year old female with past medical history of hypertension, hyperlipidemia, gastroesophageal reflux disease, irritable bowel syndrome and occasional benign positional vertigo who was in her usual state of health until yesterday when she got up to get ready to go to church and felt funny. She reports that she does not feel funny but every time she tries to try to walk her head feel funny. She has been staggering. She did not fall because she uses the assistance of all wall. Her husband noticed that she was stumbling to the left side. She reports problems with coordination and balance. She denies any visual changes or physical upper extremity and lower extremity weakness. She does report increasing fatigue in the last few days. She also reports left-sided neck pain for the past few days.   ALLERGIES: Epinephrine. Reglan, sulfa.   PAST MEDICAL HISTORY:  1. Hypertension.  2. Hyperlipidemia.  3. Gastroesophageal reflux disease. 4. Irritable bowel syndrome. 5. Benign positional vertigo. 6. History of diverticulosis, internal hemorrhoids.   PAST SURGICAL HISTORY:  1. Hysterectomy.  2. Appendectomy. 3. Left breast mass status post excision. 4. Colonoscopy.  MEDICATIONS:  Aspirin 81 mg every day, atenolol 50 mg daily, Calcium with vitamin D 1 tablet b.i.d., fluoxetine 20 mg daily. HCTZ 12.5 mg daily. Prevacid 30 mg daily. Refresh  two drops each eye as needed. Simvastatin 20 mg daily. Tylenol p.r.n.   SOCIAL HISTORY: The patient quit smoking more than 20 years ago. She denies alcohol or drug abuse. She works as a [employer]arry manager. She is married and lives with her husband.   FAMILY HISTORY: Mother had some kind of interstitial  lung disease. Father had emphysema and died of an MI.   REVIEW OF SYSTEMS.   CONSTITUTIONAL: Patient reports fatigue and weakness. Denies any fever.   EYES: Denies any blurred or double vision.   ENT: Denies any tinnitus, ear pain.   RESPIRATORY: Denies any cough, painful respiration.   CARDIOVASCULAR:  Denies chest pain, palpitations.   GI: Denies any nausea, vomiting, diarrhea, or abdominal pain.   GU: Denies any dysuria or hematuria.  ENDOCRINE: Denies any polyuria or nocturia.   HEME/LYMPH: Denies any anemia or easy bruisability.   INTEGUMENT: Denies any acne, rash.   MUSCULOSKELETAL: Denies any swelling, gout, reports some musculoskeletal SI and neck pain.  NEUROLOGICAL: Denies any numbness, weakness, reports ataxia, difficulty with gait.  PSYCH:  Denies  history of anxiety or depression.   PHYSICAL EXAMINATION:  VITAL SIGNS: Temperature 98, heart rate 82, respiratory rate 18, blood pressure 177/85, pulse oximetry 100%  on room air.   GENERAL: The patient is a 74 year old Caucasian female thin built lying comfortably in bed, not in acute distress.   HEAD: Atraumatic, normocephalic.   EYES: No pallor, icterus, or cyanosis. PERRLA. Extraocular movements intact.   ENT: Wet mucous membranes. No oropharyngeal erythema or thrush.   NECK: Supple. No masses. No JVD. No thyromegaly or lymphadenopathy.   Chest wall: No tenderness to palpation. Not using accessory muscles of respiration, intercostal muscle retractions.   LUNGS: Clear to auscultation. No wheezing, rales, or rhonchi.  CARDIOVASCULAR:  S1, S1 regular. No murmur, rubs, or gallops.   ABDOMEN: Soft, nontender, nondistended. No guarding or rigidity. No  organomegaly. Normal bowel sounds.   SKIN: No rashes or lesions.   PERIPHERIES: No pedal edema. 2+ pedal pulses.   MUSCULOSKELETAL: No cyanosis or clubbing. The patient reports tenderness to palpation on the left side of her neck posteriorly.    MUSCULOSKELETAL: No cyanosis or clubbing.    PSYCH: Normal mood and affect   NEUROLOGIC:  Awake, alert, oriented x3. Motor strength five out of five in all extremities. Sensory grossly intact. No dysdidokinesia    LABORATORY, DIAGNOSTIC, AND RADIOLOGICAL DATA: Urinalysis showed no evidence of infection. CAT scan of the head showed no abnormalities CMP normal other than an glucose 160 and AST of 47. Normal CBC, normal cardiac enzymes.   ASSESSMENT AND PLAN: A 74 year old female with past medical history of hypertension, hyperlipidemia presents with sudden onset of ataxia.  1. Possible cerebrovascular accident/transient ischemic attack. The patient reports sudden onset of  staggering problems with balance and coordination. She reports that she has benign positional vertigo and occasionally when she gets up suddenly from lying to a sitting position she has not feel dizziness, however, the presenting symptoms are different from her BPD symptoms. Initial CAT scan of the head shows no abnormality. Will get MRI, MRA, to detect any problems with posterior circulation/cerebellum. We will also get a carotid ultrasound, echo a PT consult. PT evaluation and aspirin..  2. Hyperglycemia: The patient has no history of diabetes. Her glucose is 160, which will check a hemoglobin A1c.  3. Elevated AST, unclear etiology at present. Will monitor.  4. Hypertension. Appears to be slightly elevated at present. Possibly due to acute stress will continue atenolol and HCTZ for the time being and adjust medications as needed to achieve good hypertensive control.   5. Hyperlipidemia. Will continue statin therapy and check a fasting lipid profile.  6. History of gastroesophageal reflux disease/irritable, bowel: We will continue PPI and fluoxetine. Will place GI and  deep vein thrombosis prophylaxis.  Reviewed old medical records, discussed with the ED physician, discussed with the patient and husband the plan of care and  management. Spent 75 minutes.   ____________________________ Cherre Huger, MD [id number] D: 06/12/2011 11:34:00 ET T: 06/12/2011 12:37:11 ET JOB#: [id number]  cc: Cherre Huger, MD, <Dictator> Cherre Huger MD ELECTRONICALLY SIGNED 06/12/2011 14:40

## 2014-07-19 NOTE — Discharge Summary (Signed)
PATIENT NAME:  Doris Barnes, Doris Barnes MR#:  [id number] DATE OF BIRTH:  1941/02/10  DATE OF ADMISSION:  06/12/2011 DATE OF DISCHARGE:    DISCHARGE DIAGNOSES: 1. Transient ischemic attack.  2. Positional vertigo. 3. Hypertension.  4. Hyperlipidemia.   CHIEF COMPLAINT: Difficulty walking, unsteadiness, headache.   HISTORY OF PRESENT ILLNESS: Doris Barnes is a 74 year old female with a history of hypertension, hyperlipidemia, gastroesophageal reflux disease, irritable bowel syndrome, and positional vertigo who presents to the Emergency Room complaining of a sense of heaviness in her head associated with a tendency to stagger and fall to the left side. The patient denied visual changes. No weakness in her upper or lower body, or limbs. The patient has been complaining of left-sided neck pain and left posterior scapular pain for the last few days.   PAST MEDICAL HISTORY: Significant for hypertension, hyperlipidemia, gastroesophageal reflux disease, irritable bowel syndrome, benign positional vertigo, history of diverticulosis.   PAST SURGICAL HISTORY: Significant for hysterectomy, appendectomy, left breast mass status post excision, and colonoscopy.   PHYSICAL EXAMINATION: VITAL SIGNS: Temperature 98, heart rate 82, respirations 18, blood pressure 177/85, pulse oximetry 100% on room air.   GENERAL: She was not in distress.   HEENT: Normocephalic, atraumatic.   NECK: Supple.   HEART: S1, S2.   LUNGS: Clear.   ABDOMEN: Soft, nontender.   EXTREMITIES: No edema.   NEUROLOGIC: Alert and oriented x3. No obvious focal deficits.   LABS/STUDIES: Hemoglobin 12.9, WBC count 5.4, hematocrit 38.7, platelet 236,000. Magnesium 1.9. A1c 6.8. Cholesterol 196, triglycerides 265. TSH 2.46. Glucose 97, BUN 11, creatinine 0.7, sodium 144, potassium 4.1, chloride 101, CO2 of 30, calcium 9.2. CPK 94, MB 1.7. Troponin less than 0.02.  CT of the head without contrast did not show any acute intracranial process.  Chest x-ray did not show evidence of acute cardiopulmonary disease. MRI of the brain did not show any acute intracranial pathology. MRA did not show any intracranial aneurysm or stenosis. Ultrasound of the carotids did not show any evidence of hemodynamically significant stenosis in the right or left carotid systems. There was asymmetric calcific plaque in the carotid bulb demonstrating less than 50% visual stenosis.  During her stay in the hospital, the patient gradually improved, and she was seen by physical therapy. She did have a slightly elevated AST of 47 but essentially ruled out for a myocardial infarction and was ambulated. Felt well and was stable at the time of discharge. The patient was discharged on the following medications.   DISCHARGE MEDICATIONS: Atenolol 50 mg once a day. Simvastatin 20 mg once a day. Fluoxetine 20 mg once a day. Lansoprazole 30 mg once a day. Hydrochlorothiazide 12.5 mg a day. Aspirin 81 mg a day. Tylenol 1 to 2 tablets as needed. Refresh ophthalmic solution two drops into each eye as needed. Caltrate plus D 600 one tablet p.o. b.i.d.  The patient is advised a low-sodium diet and to call us or return to the clinic if she has any         further symptoms. The patient was also given a prescription for meclizine 12.5 mg p.o. b.i.d. p.r.n.     ____________________________ Tracie Harrier, MD vh:kma D: 06/13/2011 13:04:52 ET T: 06/13/2011 13:36:07 ET JOB#: [id number]  cc: Tracie Harrier, MD, <Dictator> Tracie Harrier MD ELECTRONICALLY SIGNED 06/16/2011 13:11

## 2014-09-17 ENCOUNTER — Emergency Department: Payer: Commercial Managed Care - HMO

## 2014-09-17 ENCOUNTER — Observation Stay
Admission: EM | Admit: 2014-09-17 | Discharge: 2014-09-21 | Disposition: A | Discharge status: Home / Self Care | Payer: Commercial Managed Care - HMO | Attending: Internal Medicine | Admitting: Internal Medicine

## 2014-09-17 ENCOUNTER — Encounter: Payer: Self-pay | Admitting: Emergency Medicine

## 2014-09-17 DIAGNOSIS — R531 Weakness: Secondary | ICD-10-CM | POA: Diagnosis not present

## 2014-09-17 DIAGNOSIS — R413 Other amnesia: Secondary | ICD-10-CM | POA: Insufficient documentation

## 2014-09-17 DIAGNOSIS — F419 Anxiety disorder, unspecified: Secondary | ICD-10-CM | POA: Insufficient documentation

## 2014-09-17 DIAGNOSIS — K219 Gastro-esophageal reflux disease without esophagitis: Secondary | ICD-10-CM | POA: Diagnosis not present

## 2014-09-17 DIAGNOSIS — H5500 Unspecified nystagmus: Secondary | ICD-10-CM | POA: Insufficient documentation

## 2014-09-17 DIAGNOSIS — R51 Headache: Secondary | ICD-10-CM | POA: Diagnosis not present

## 2014-09-17 DIAGNOSIS — M542 Cervicalgia: Secondary | ICD-10-CM

## 2014-09-17 DIAGNOSIS — E785 Hyperlipidemia, unspecified: Secondary | ICD-10-CM | POA: Insufficient documentation

## 2014-09-17 DIAGNOSIS — R270 Ataxia, unspecified: Principal | ICD-10-CM

## 2014-09-17 DIAGNOSIS — M5030 Other cervical disc degeneration, unspecified cervical region: Secondary | ICD-10-CM | POA: Diagnosis present

## 2014-09-17 DIAGNOSIS — Z87891 Personal history of nicotine dependence: Secondary | ICD-10-CM | POA: Diagnosis not present

## 2014-09-17 DIAGNOSIS — I341 Nonrheumatic mitral (valve) prolapse: Secondary | ICD-10-CM | POA: Insufficient documentation

## 2014-09-17 DIAGNOSIS — I10 Essential (primary) hypertension: Secondary | ICD-10-CM | POA: Diagnosis present

## 2014-09-17 DIAGNOSIS — I639 Cerebral infarction, unspecified: Secondary | ICD-10-CM

## 2014-09-17 DIAGNOSIS — F329 Major depressive disorder, single episode, unspecified: Secondary | ICD-10-CM | POA: Diagnosis not present

## 2014-09-17 HISTORY — DX: Essential (primary) hypertension: I10

## 2014-09-17 HISTORY — DX: Nonrheumatic mitral (valve) prolapse: I34.1

## 2014-09-17 LAB — CBC
HCT: 44.2 % (ref 35.0–47.0)
HCT: 41.5 % (ref 35.0–47.0)
Hemoglobin: 14.6 g/dL (ref 12.0–16.0)
Hemoglobin: 13.9 g/dL (ref 12.0–16.0)
MCH: 30.2 pg (ref 26.0–34.0)
MCH: 30.3 pg (ref 26.0–34.0)
MCHC: 33.1 g/dL (ref 32.0–36.0)
MCHC: 33.4 g/dL (ref 32.0–36.0)
MCV: 91.2 fL (ref 80.0–100.0)
MCV: 90.7 fL (ref 80.0–100.0)
Platelets: 220 10*3/uL (ref 150–440)
Platelets: 208 10*3/uL (ref 150–440)
RBC: 4.84 MIL/uL (ref 3.80–5.20)
RBC: 4.57 MIL/uL (ref 3.80–5.20)
RDW: 12.8 % (ref 11.5–14.5)
RDW: 12.9 % (ref 11.5–14.5)
WBC: 6.5 10*3/uL (ref 3.6–11.0)
WBC: 7 10*3/uL (ref 3.6–11.0)

## 2014-09-17 LAB — BASIC METABOLIC PANEL
Anion gap: 8 (ref 5–15)
BUN: 11 mg/dL (ref 6–20)
CO2: 30 mmol/L (ref 22–32)
Calcium: 10 mg/dL (ref 8.9–10.3)
Chloride: 101 mmol/L (ref 101–111)
Creatinine, Ser: 0.72 mg/dL (ref 0.44–1.00)
GFR calc Af Amer: >60 mL/min (ref >60)
GFR calc non Af Amer: >60 mL/min (ref >60)
Glucose, Bld: 85 mg/dL (ref 65–99)
Potassium: 4.2 mmol/L (ref 3.5–5.1)
Sodium: 139 mmol/L (ref 135–145)

## 2014-09-17 LAB — LIPID PANEL
Cholesterol: 183 mg/dL (ref 0–200)
HDL: 47 mg/dL (ref >40)
LDL Cholesterol: 103 mg/dL — ABNORMAL HIGH (ref 0–99)
Total CHOL/HDL Ratio: 3.9 RATIO
Triglycerides: 167 mg/dL — ABNORMAL HIGH (ref <150)
VLDL: 33 mg/dL (ref 0–40)

## 2014-09-17 LAB — CREATININE, SERUM
Creatinine, Ser: 0.72 mg/dL (ref 0.44–1.00)
GFR calc Af Amer: >60 mL/min (ref >60)
GFR calc non Af Amer: >60 mL/min (ref >60)

## 2014-09-17 LAB — TROPONIN I: Troponin I: <0.03 ng/mL (ref <0.031)

## 2014-09-17 LAB — GLUCOSE, CAPILLARY: Glucose-Capillary: 71 mg/dL (ref 65–99)

## 2014-09-17 MED ORDER — ONDANSETRON HCL 4 MG/2ML IJ SOLN: 4.0000 mg | Freq: Four times a day (QID) | INTRAMUSCULAR | Status: DC | PRN

## 2014-09-17 MED ORDER — FLUOXETINE HCL 20 MG PO CAPS
20.0000 mg | ORAL_CAPSULE | Freq: Every day | ORAL | Status: DC
  Administered 2014-09-17 – 2014-09-21 (×5): 20 mg via ORAL
  Filled 2014-09-17 (×5): qty 1

## 2014-09-17 MED ORDER — PANTOPRAZOLE SODIUM 40 MG PO TBEC
40.0000 mg | DELAYED_RELEASE_TABLET | Freq: Every day | ORAL | Status: DC
  Administered 2014-09-18 – 2014-09-21 (×4): 40 mg via ORAL
  Filled 2014-09-17 (×4): qty 1

## 2014-09-17 MED ORDER — ASPIRIN 81 MG PO CHEW
CHEWABLE_TABLET | ORAL | Status: AC
  Administered 2014-09-17: 324 mg via ORAL
  Filled 2014-09-17: qty 4

## 2014-09-17 MED ORDER — ONDANSETRON HCL 4 MG PO TABS: 4.0000 mg | ORAL_TABLET | Freq: Four times a day (QID) | ORAL | Status: DC | PRN

## 2014-09-17 MED ORDER — ASPIRIN 81 MG PO CHEW
324.0000 mg | CHEWABLE_TABLET | Freq: Once | ORAL | Status: AC
  Administered 2014-09-17: 324 mg via ORAL

## 2014-09-17 MED ORDER — LORATADINE 10 MG PO TABS
10.0000 mg | ORAL_TABLET | Freq: Every day | ORAL | Status: DC
  Administered 2014-09-17 – 2014-09-21 (×5): 10 mg via ORAL
  Filled 2014-09-17 (×5): qty 1

## 2014-09-17 MED ORDER — CLOPIDOGREL BISULFATE 75 MG PO TABS
75.0000 mg | ORAL_TABLET | Freq: Every day | ORAL | Status: DC
  Administered 2014-09-17 – 2014-09-21 (×5): 75 mg via ORAL
  Filled 2014-09-17 (×5): qty 1

## 2014-09-17 MED ORDER — HYDRALAZINE HCL 20 MG/ML IJ SOLN: 10.0000 mg | Freq: Four times a day (QID) | INTRAMUSCULAR | Status: DC | PRN

## 2014-09-17 MED ORDER — SENNOSIDES-DOCUSATE SODIUM 8.6-50 MG PO TABS: 1.0000 | ORAL_TABLET | Freq: Every evening | ORAL | Status: DC | PRN

## 2014-09-17 MED ORDER — ALPRAZOLAM 0.25 MG PO TABS
0.2500 mg | ORAL_TABLET | Freq: Every day | ORAL | Status: DC
  Administered 2014-09-17 – 2014-09-18 (×2): 0.25 mg via ORAL
  Filled 2014-09-17 (×2): qty 1

## 2014-09-17 MED ORDER — ACETAMINOPHEN 650 MG RE SUPP: 650.0000 mg | Freq: Four times a day (QID) | RECTAL | Status: DC | PRN

## 2014-09-17 MED ORDER — SODIUM CHLORIDE 0.9 % IV SOLN
INTRAVENOUS | Status: DC
  Administered 2014-09-17 – 2014-09-18 (×2): via INTRAVENOUS

## 2014-09-17 MED ORDER — ACETAMINOPHEN 325 MG PO TABS
650.0000 mg | ORAL_TABLET | Freq: Four times a day (QID) | ORAL | Status: DC | PRN
  Administered 2014-09-17 – 2014-09-19 (×4): 650 mg via ORAL
  Filled 2014-09-17 (×4): qty 2

## 2014-09-17 MED ORDER — ALUM & MAG HYDROXIDE-SIMETH 200-200-20 MG/5ML PO SUSP: 30.0000 mL | Freq: Four times a day (QID) | ORAL | Status: DC | PRN

## 2014-09-17 MED ORDER — HEPARIN SODIUM (PORCINE) 5000 UNIT/ML IJ SOLN
5000.0000 [IU] | Freq: Three times a day (TID) | INTRAMUSCULAR | Status: DC
  Administered 2014-09-17 – 2014-09-21 (×12): 5000 [IU] via SUBCUTANEOUS
  Filled 2014-09-17 (×12): qty 1

## 2014-09-17 MED ORDER — KETOROLAC TROMETHAMINE 30 MG/ML IJ SOLN
30.0000 mg | Freq: Once | INTRAMUSCULAR | Status: AC
  Administered 2014-09-17: 30 mg via INTRAVENOUS
  Filled 2014-09-17: qty 1

## 2014-09-17 MED ORDER — SIMVASTATIN 20 MG PO TABS
20.0000 mg | ORAL_TABLET | Freq: Every day | ORAL | Status: DC
  Administered 2014-09-17 – 2014-09-20 (×4): 20 mg via ORAL
  Filled 2014-09-17 (×4): qty 1

## 2014-09-17 NOTE — H&P (Signed)
[facility] NAME: Doris Barnes    MR#:  [id number]  DATE OF BIRTH:  September 18, 1940  DATE OF ADMISSION:  09/17/2014  PRIMARY CARE PHYSICIAN: Rusty Aus., MD   REQUESTING/REFERRING PHYSICIAN: Dr. Jimmye Norman  CHIEF COMPLAINT:  Ataxia HISTORY OF PRESENT ILLNESS:  Doris Barnes  is a 74 y.o. female with a known history of present general mitral valve prolapse who presents today with above complaint. Patient reports that she was at the beauty salon this morning. When she got up she was unable to walk. She felt like she was losing her balance and almost fell. She called her husband who brought her to the emergency department. In the emergency department she demonstrates ataxia. She denies dizziness, ringing in the ear, or any other neurological deficits. Her husband also notes that she is having difficulty finding her words. A CT head was performed in the emergency department was negative for an acute CVA. She was given aspirin.  PAST MEDICAL HISTORY:   Past Medical History  Diagnosis Date  . Hypertension   . Mitral valve prolapse     PAST SURGICAL HISTORY:  Appendectomy Hysterectomy  SOCIAL HISTORY:   History  Substance Use Topics  . Smoking status: Former Research scientist (life sciences)  . Smokeless tobacco: Not on file  . Alcohol Use: No    FAMILY HISTORY:  Positive for CVA and diabetes  DRUG ALLERGIES:   Allergies  Allergen Reactions  . Sulfa Antibiotics Rash     REVIEW OF SYSTEMS:  CONSTITUTIONAL: No fever, fatigue or weakness.  EYES: No blurred or double vision.  EARS, NOSE, AND THROAT: No tinnitus or ear pain.  RESPIRATORY: No cough, shortness of breath, wheezing or hemoptysis.  CARDIOVASCULAR: No chest pain, orthopnea, edema.  GASTROINTESTINAL: No nausea, vomiting, diarrhea or abdominal pain.  GENITOURINARY: No dysuria, hematuria.  ENDOCRINE: No polyuria, nocturia,  HEMATOLOGY: No anemia, easy bruising or bleeding SKIN: No  rash or lesion. MUSCULOSKELETAL: No joint pain or arthritis.   NEUROLOGIC: No tingling, numbness, positive loss of balance/ataxia  PSYCHIATRY: Positive for anxiety and depression  MEDICATIONS AT HOME:    Xanax 0.25 mg at bedtime Aspirin 81 mg daily Atenolol 50 mg daily Premarin daily Prozac 20 Mg daily HCTZ 12.5 mm daily Prevacid 30 daily next line  Mobile 15 mg daily Zocor 20 mg at bedtime Claritin 10 daily  VITAL SIGNS:  Blood pressure 161/81, pulse 74, temperature 98 F (36.7 C), temperature source Oral, resp. rate 23, height 5\' 3"  (1.6 m), weight 55.339 kg (122 lb), SpO2 97 %.  PHYSICAL EXAMINATION:  GENERAL:  74 y.o.-year-old patient lying in the bed with no acute distress.  EYES: Pupils equal, round, reactive to light and accommodation. No scleral icterus. Extraocular muscles intact.  HEENT: Head atraumatic, normocephalic. Oropharynx and nasopharynx clear.  NECK:  Supple, no jugular venous distention. No thyroid enlargement, no tenderness.  LUNGS: Normal breath sounds bilaterally, no wheezing, rales,rhonchi or crepitation. No use of accessory muscles of respiration.  CARDIOVASCULAR: S1, S2 normal. No murmurs, rubs, or gallops.  ABDOMEN: Soft, nontender, nondistended. Bowel sounds present. No organomegaly or mass.  EXTREMITIES: No pedal edema, cyanosis, or clubbing.  NEUROLOGIC: Patient has left-sided nystagmus and clonus. Patient has ataxia upon ambulation PSYCHIATRIC: The patient is alert and oriented x 3.  SKIN: No obvious rash, lesion, or ulcer.   LABORATORY PANEL:   CBC  Recent Labs Lab 09/17/14 1345  WBC 6.5  HGB 14.6  HCT 44.2  PLT 220   ------------------------------------------------------------------------------------------------------------------  Chemistries   Recent Labs Lab 09/17/14 1345  NA 139  K 4.2  CL 101  CO2 30  GLUCOSE 85  BUN 11  CREATININE 0.72  CALCIUM 10.0    ------------------------------------------------------------------------------------------------------------------  Cardiac Enzymes No results for input(s): TROPONINI in the last 168 hours. ------------------------------------------------------------------------------------------------------------------  RADIOLOGY:  CT head: No acute intracranial hemorrhage or CVA EKG:  Sinus rhythm no ST elevation or depression  IMPRESSION AND PLAN:  This is a 74 year old female with a history of hypertension and mitral valve prolapse who presents with ataxia.  1. Ataxia: Patient also has nystagmus and clonus which may be concerning for stroke. She demonstrates no other neurological deficits. Patient will undergo stroke workup including MRI of the brain, echocardiogram and carotid Doppler. Neuro checks have been ordered every 4 hours. Patient is already on aspirin to have changes to Plavix. Patient will continue on statin. Lipid panel will be checked. Patient will need physical therapy consultation as well. She is on Premarin I will hold this for now.   2. Hypertension: We will hold outpatient medications and allow brain perfusion due to the possibility of an acute stroke. 3. Depression/anxiety: I will continue Prozac and Xanax.  4. GERD: Patient will continue on PPI.  I. Hyperlipidemia: Patient is on Zocor which I'll continue. I will check fasting lipids.    All the records are reviewed and case discussed with ED provider. Management plans discussed with the patient and she is in agreement.  CODE STATUS: FULL  TOTAL TIME TAKING CARE OF THIS PATIENT: 50 minutes.    Dennise Bamber M.D on 09/17/2014 at 3:32 PM  Between 7am to 6pm - Pager - [phone] After 6pm go to [email or web address] - password [email or web address] [facility]  [facility]  Office  [phone]  CC: Primary care physician; Rusty Aus., MD

## 2014-09-17 NOTE — ED Provider Notes (Signed)
[facility] Emergency Department Provider Note     Time seen: ----------------------------------------- 2:31 PM on 09/17/2014 -----------------------------------------    I have reviewed the triage vital signs and the nursing notes.   HISTORY  Chief Complaint Blurred Vision; Weakness; and Headache    HPI Doris Barnes is a 74 y.o. female who presents ER for balance trouble. Patient states she's also had a left-sided headache, states she had the same type of episode happened earlier in the week and her symptoms improved but today she has not improved. She denies any vision trouble, any speech trouble, any swallowing trouble. Patient states she mainly just off balance, she does have history of vertigo but does not have room spinning sensation. Symptoms are moderate to severe this time, ambulation of any sort makes her symptoms worse   Past Medical History  Diagnosis Date  . Hypertension   . Mitral valve prolapse     There are no active problems to display for this patient.   No past surgical history on file.  Allergies Sulfa antibiotics  Social History History  Substance Use Topics  . Smoking status: Former Research scientist (life sciences)  . Smokeless tobacco: Not on file  . Alcohol Use: No   Review of Systems Constitutional: Negative for fever. Eyes: Negative for visual changes. ENT: Negative for sore throat. Cardiovascular: Negative for chest pain. Respiratory: Negative for shortness of breath. Gastrointestinal: Negative for abdominal pain, vomiting and diarrhea. Genitourinary: Negative for dysuria. Musculoskeletal: Negative for back pain. Skin: Negative for rash. Neurological: Positive for headache and balance trouble Psychiatric: Positive for anxiety, patient states is not out of the ordinary for her.  10-point ROS otherwise negative.  ____________________________________________   PHYSICAL EXAM:  VITAL SIGNS: ED Triage Vitals  Enc Vitals Group     BP 09/17/14 1334 171/92 mmHg     Pulse Rate 09/17/14 1334 68     Resp 09/17/14 1334 18     Temp 09/17/14 1334 98 F (36.7 C)     Temp Source 09/17/14 1334 Oral     SpO2 09/17/14 1334 97 %     Weight 09/17/14 1334 122 lb (55.339 kg)     Height 09/17/14 1334 5\' 3"  (1.6 m)     Head Cir --      Peak Flow --      Pain Score 09/17/14 1334 5     Pain Loc --      Pain Edu? --      Excl. in [place]? --     Constitutional: Alert and oriented. Well appearing and in no distress. Eyes: Conjunctivae are normal. PERRL. Normal extraocular movements. ENT   Head: Normocephalic and atraumatic.   Nose: No congestion/rhinnorhea.   Mouth/Throat: Mucous membranes are moist.   Neck: No stridor. Hematological/Lymphatic/Immunilogical: No cervical lymphadenopathy. Cardiovascular: Normal rate, regular rhythm. Normal and symmetric distal pulses are present in all extremities. No murmurs, rubs, or gallops. Respiratory: Normal respiratory effort without tachypnea nor retractions. Breath sounds are clear and equal bilaterally. No wheezes/rales/rhonchi. Gastrointestinal: Soft and nontender. No distention. No abdominal bruits. There is no CVA tenderness. Musculoskeletal: Nontender with normal range of motion in all extremities. No joint effusions.  No lower extremity tenderness nor edema. Neurologic:  Normal speech and language.  Speech is normal. There is markedly gait instability, she cannot walk on her heels or toes she cannot perform a tandem gait to the point where I'm having to catch her to keep from falling. Finger to nose is shaky but satisfactory. Main issue  seems to be ataxia Skin:  Skin is warm, dry and intact. No rash noted. Psychiatric: Mood and affect are normal. Speech and behavior are normal. Patient exhibits appropriate insight and judgment. ____________________________________________  EKG: Interpreted by me. Normal sinus rhythm, with LVH, normal axis, no evidence of acute infarction. Rate  is 75  ____________________________________________  ED COURSE:  Pertinent labs & imaging results that were available during my care of the patient were reviewed by me and considered in my medical decision making (see chart for details). Patient with markedly ataxia here in the ER, we'll need CT and labs. ____________________________________________    LABS (pertinent positives/negatives)  Labs Reviewed  CBC  BASIC METABOLIC PANEL  GLUCOSE, CAPILLARY  CBG MONITORING, ED    RADIOLOGY Images were viewed by me CT head  IMPRESSION: No evidence of acute intracranial abnormality.  ____________________________________________  FINAL ASSESSMENT AND PLAN  Ataxia  Plan: Patient with markedly ataxia here. He is a significant fall risk. We'll need MRI. We will give an adult aspirin and possibly have neurology see the patient in the hospital.   Earleen Newport, MD   Earleen Newport, MD 09/17/14 [phone]

## 2014-09-17 NOTE — ED Notes (Signed)
Pt states she was getting her hair done and when she got up from the chair she was unable to keep her balance and she started having a headache, states she "feels funny in my head", states she had the same type of episode earlier in the week and the symptoms improved but she felt tired for a couple of days

## 2014-09-18 ENCOUNTER — Inpatient Hospital Stay: Payer: Commercial Managed Care - HMO

## 2014-09-18 ENCOUNTER — Inpatient Hospital Stay
Admit: 2014-09-18 | Discharge: 2014-09-18 | Disposition: A | Discharge status: Home / Self Care | Payer: Commercial Managed Care - HMO | Attending: Internal Medicine | Admitting: Internal Medicine

## 2014-09-18 DIAGNOSIS — I639 Cerebral infarction, unspecified: Secondary | ICD-10-CM | POA: Diagnosis not present

## 2014-09-18 DIAGNOSIS — I638 Other cerebral infarction: Secondary | ICD-10-CM | POA: Diagnosis not present

## 2014-09-18 DIAGNOSIS — H5500 Unspecified nystagmus: Secondary | ICD-10-CM | POA: Diagnosis not present

## 2014-09-18 DIAGNOSIS — K219 Gastro-esophageal reflux disease without esophagitis: Secondary | ICD-10-CM | POA: Diagnosis not present

## 2014-09-18 DIAGNOSIS — R270 Ataxia, unspecified: Secondary | ICD-10-CM | POA: Diagnosis not present

## 2014-09-18 DIAGNOSIS — E785 Hyperlipidemia, unspecified: Secondary | ICD-10-CM | POA: Diagnosis not present

## 2014-09-18 DIAGNOSIS — R531 Weakness: Secondary | ICD-10-CM | POA: Diagnosis not present

## 2014-09-18 DIAGNOSIS — F419 Anxiety disorder, unspecified: Secondary | ICD-10-CM | POA: Diagnosis not present

## 2014-09-18 DIAGNOSIS — R413 Other amnesia: Secondary | ICD-10-CM | POA: Diagnosis not present

## 2014-09-18 DIAGNOSIS — I10 Essential (primary) hypertension: Secondary | ICD-10-CM | POA: Diagnosis not present

## 2014-09-18 DIAGNOSIS — M5030 Other cervical disc degeneration, unspecified cervical region: Secondary | ICD-10-CM | POA: Diagnosis present

## 2014-09-18 DIAGNOSIS — I6523 Occlusion and stenosis of bilateral carotid arteries: Secondary | ICD-10-CM | POA: Diagnosis not present

## 2014-09-18 DIAGNOSIS — I341 Nonrheumatic mitral (valve) prolapse: Secondary | ICD-10-CM | POA: Diagnosis not present

## 2014-09-18 LAB — TSH: TSH: 1.608 u[IU]/mL (ref 0.350–4.500)

## 2014-09-18 LAB — FOLATE: Folate: 25 ng/mL (ref >5.9)

## 2014-09-18 LAB — SEDIMENTATION RATE: Sed Rate: 12 mm/hr (ref 0–30)

## 2014-09-18 MED ORDER — LORAZEPAM 2 MG/ML IJ SOLN
INTRAMUSCULAR | Status: AC
  Filled 2014-09-18: qty 1

## 2014-09-18 MED ORDER — VALPROATE SODIUM 500 MG/5ML IV SOLN
500.0000 mg | Freq: Once | INTRAVENOUS | Status: AC
  Administered 2014-09-18: 500 mg via INTRAVENOUS
  Filled 2014-09-18: qty 5

## 2014-09-18 MED ORDER — LORAZEPAM 2 MG/ML IJ SOLN
1.0000 mg | INTRAMUSCULAR | Status: AC
  Administered 2014-09-18: 1 mg via INTRAVENOUS

## 2014-09-18 MED ORDER — SODIUM CHLORIDE 0.9 % IJ SOLN
INTRAMUSCULAR | Status: AC
  Administered 2014-09-18: 08:00:00
  Filled 2014-09-18: qty 10

## 2014-09-18 NOTE — Consult Note (Signed)
Reason for Consult: ataxia Referring Physician: [name] is an 74 y.o. female.  HPI: seen at request of Dr. Caryl Comes for possible ataxia;  74 yo RHD F who went to bathroom 5 days ago and presents with ataxia.  She states that she does not feel dizzy or have any N/V.  She reports having BPPV in the past and that this is different.  She did not come in for two days because she just did not feel right.  She reports having memory loss for the past 10 years but reports multiple episodes of forgetting things with headaches.  Pt denies focal weakness or numbness or vision changes.  She does have some neck pain.  She seems to fall more to the right now.  Past Medical History  Diagnosis Date  . Hypertension   . Mitral valve prolapse     No past surgical history on file.  No family history on file.  Social History:  reports that she has quit smoking. She does not have any smokeless tobacco history on file. She reports that she does not drink alcohol. Her drug history is not on file.  Allergies:  Allergies  Allergen Reactions  . Sulfa Antibiotics Rash    Medications: personally reviewed by me as per chart  Results for orders placed or performed during the hospital encounter of 09/17/14 (from the past 48 hour(s))  CBC     Status: None   Collection Time: 09/17/14  1:45 PM  Result Value Ref Range   WBC 6.5 3.6 - 11.0 K/uL   RBC 4.84 3.80 - 5.20 MIL/uL   Hemoglobin 14.6 12.0 - 16.0 g/dL   HCT 44.2 35.0 - 47.0 %   MCV 91.2 80.0 - 100.0 fL   MCH 30.2 26.0 - 34.0 pg   MCHC 33.1 32.0 - 36.0 g/dL   RDW 12.8 11.5 - 14.5 %   Platelets 220 150 - 440 K/uL  Basic metabolic panel     Status: None   Collection Time: 09/17/14  1:45 PM  Result Value Ref Range   Sodium 139 135 - 145 mmol/L   Potassium 4.2 3.5 - 5.1 mmol/L   Chloride 101 101 - 111 mmol/L   CO2 30 22 - 32 mmol/L   Glucose, Bld 85 65 - 99 mg/dL   BUN 11 6 - 20 mg/dL   Creatinine, Ser 0.72 0.44 - 1.00 mg/dL   Calcium  10.0 8.9 - 10.3 mg/dL   GFR calc non Af Amer >60 >60 mL/min   GFR calc Af Amer >60 >60 mL/min    Comment: (NOTE) The eGFR has been calculated using the CKD EPI equation. This calculation has not been validated in all clinical situations. eGFR's persistently <60 mL/min signify possible Chronic Kidney Disease.    Anion gap 8 5 - 15  Glucose, capillary     Status: None   Collection Time: 09/17/14  1:45 PM  Result Value Ref Range   Glucose-Capillary 71 65 - 99 mg/dL  Troponin I     Status: None   Collection Time: 09/17/14  5:18 PM  Result Value Ref Range   Troponin I <0.03 <0.031 ng/mL    Comment:        NO INDICATION OF MYOCARDIAL INJURY.   Lipid panel     Status: Abnormal   Collection Time: 09/17/14  5:18 PM  Result Value Ref Range   Cholesterol 183 0 - 200 mg/dL   Triglycerides 167 (H) <150 mg/dL  HDL 47 >40 mg/dL   Total CHOL/HDL Ratio 3.9 RATIO   VLDL 33 0 - 40 mg/dL   LDL Cholesterol 103 (H) 0 - 99 mg/dL    Comment:        Total Cholesterol/HDL:CHD Risk Coronary Heart Disease Risk Table                     Men   Women  1/2 Average Risk   3.4   3.3  Average Risk       5.0   4.4  2 X Average Risk   9.6   7.1  3 X Average Risk  23.4   11.0        Use the calculated Patient Ratio above and the CHD Risk Table to determine the patient's CHD Risk.        ATP III CLASSIFICATION (LDL):  <100     mg/dL   Optimal  100-129  mg/dL   Near or Above                    Optimal  130-159  mg/dL   Borderline  160-189  mg/dL   High  >190     mg/dL   Very High   CBC     Status: None   Collection Time: 09/17/14  5:18 PM  Result Value Ref Range   WBC 7.0 3.6 - 11.0 K/uL   RBC 4.57 3.80 - 5.20 MIL/uL   Hemoglobin 13.9 12.0 - 16.0 g/dL   HCT 41.5 35.0 - 47.0 %   MCV 90.7 80.0 - 100.0 fL   MCH 30.3 26.0 - 34.0 pg   MCHC 33.4 32.0 - 36.0 g/dL   RDW 12.9 11.5 - 14.5 %   Platelets 208 150 - 440 K/uL  Creatinine, serum     Status: None   Collection Time: 09/17/14  5:18 PM   Result Value Ref Range   Creatinine, Ser 0.72 0.44 - 1.00 mg/dL   GFR calc non Af Amer >60 >60 mL/min   GFR calc Af Amer >60 >60 mL/min    Comment: (NOTE) The eGFR has been calculated using the CKD EPI equation. This calculation has not been validated in all clinical situations. eGFR's persistently <60 mL/min signify possible Chronic Kidney Disease.     Ct Head Wo Contrast  09/17/2014   CLINICAL DATA:  Headache, blurred vision, and weakness.  EXAM: CT HEAD WITHOUT CONTRAST  TECHNIQUE: Contiguous axial images were obtained from the base of the skull through the vertex without intravenous contrast.  COMPARISON:  Brain MRI 11/11/2011.  Head CT 06/12/2011.  FINDINGS: There is mild generalized cerebral atrophy. There is no evidence of acute cortical infarct, intracranial hemorrhage, mass, midline shift, or extra-axial fluid collection.  Visualized orbits are unremarkable. Mastoid air cells are clear. Minimal left sphenoid sinus mucosal thickening is noted.  IMPRESSION: No evidence of acute intracranial abnormality.   Electronically Signed   By: Logan Bores   On: 09/17/2014 14:25   Mr Brain Wo Contrast  09/18/2014   CLINICAL DATA:  Stroke.  Headache.  EXAM: MRI HEAD WITHOUT CONTRAST  TECHNIQUE: Multiplanar, multiecho pulse sequences of the brain and surrounding structures were obtained without intravenous contrast.  COMPARISON:  CT head 09/17/2014  FINDINGS: Mild cortical atrophy, typical for age.  Negative for hydrocephalus.  Pituitary not enlarged. Cervical medullary junction normal. Calvarium intact. Normal orbit.  Paranasal sinuses clear.  Negative for acute infarct. Scattered small hyperintensities in the deep white  matter bilaterally consistent with mild chronic microvascular ischemia. Brainstem and cerebellum intact  Negative for hemorrhage or mass lesion.  IMPRESSION: Atrophy and mild chronic microvascular ischemic changes, typical for age. No acute abnormality.   Electronically Signed   By:  Franchot Gallo M.D.   On: 09/18/2014 09:45   US Carotid Bilateral  09/18/2014   CLINICAL DATA:  CVA.  EXAM: BILATERAL CAROTID DUPLEX ULTRASOUND  TECHNIQUE: Pearline Cables scale imaging, color Doppler and duplex ultrasound were performed of bilateral carotid and vertebral arteries in the neck.  COMPARISON:  None.  FINDINGS: Criteria: Quantification of carotid stenosis is based on velocity parameters that correlate the residual internal carotid diameter with NASCET-based stenosis levels, using the diameter of the distal internal carotid lumen as the denominator for stenosis measurement.  The following velocity measurements were obtained:  RIGHT  ICA:  72/26 cm/sec  CCA:  95/62 cm/sec  SYSTOLIC ICA/CCA RATIO:  1.1  DIASTOLIC ICA/CCA RATIO:  1.5  ECA:  67 cm/sec  LEFT  ICA:  80/27 cm/sec  CCA:  13/08 cm/sec  SYSTOLIC ICA/CCA RATIO:  1.7  DIASTOLIC ICA/CCA RATIO:  1.9  ECA:   cm/sec  RIGHT CAROTID ARTERY: Mild plaque, no flow limiting stenosis. Waveforms are unremarkable.  RIGHT VERTEBRAL ARTERY:  Patent antegrade flow.  LEFT CAROTID ARTERY: Mild plaque, no flow limiting stenosis. Waveforms are unremarkable.  LEFT VERTEBRAL ARTERY:  Patent antegrade flow.  IMPRESSION: 1. Mild bilateral carotid atherosclerotic vascular disease. No flow limiting stenosis.  2.  Vertebral arteries are patent with antegrade flow.   Electronically Signed   By: Marcello Moores  Register   On: 09/18/2014 11:04    Review of Systems  Constitutional: Negative.   Eyes: Negative.   Respiratory: Negative.   Cardiovascular: Negative.   Gastrointestinal: Negative.   Musculoskeletal: Positive for falls and neck pain. Negative for myalgias, back pain and joint pain.  Skin: Negative.   Neurological: Positive for dizziness and focal weakness. Negative for tingling, tremors, sensory change, speech change, seizures and loss of consciousness.  Psychiatric/Behavioral: Negative.    Blood pressure 144/70, pulse 71, temperature 97.5 F (36.4 C), temperature source  Oral, resp. rate 18, height '5\' 3"'  (1.6 m), weight 55.021 kg (121 lb 4.8 oz), SpO2 95 %. Physical Exam  Constitutional: She appears well-developed and well-nourished. No distress.  HENT:  Head: Normocephalic and atraumatic.  Nose: Nose normal.  Mouth/Throat: Oropharynx is clear and moist.  Eyes: Conjunctivae and EOM are normal. Pupils are equal, round, and reactive to light.  Neck: Normal range of motion. Neck supple.  Cardiovascular: Normal rate, regular rhythm, normal heart sounds and intact distal pulses.   No murmur heard. Respiratory: Effort normal and breath sounds normal.  GI: Soft. Bowel sounds are normal.  Musculoskeletal: Normal range of motion.  Lymphadenopathy:    She has no cervical adenopathy.  Neurological:  A+Ox3, nl speech and language PERRLA, EOMI with nystagmus on lateral gaze, nl VF, face symmetric, tongue midline 5/5 B, nl tone, mild tremor FTN and HTS WNL, nl RAM, pt falls to R with gait 3+/4 B UE, 2+/4 B LE, mild R hoffmann, - Plantars B NL temp and vibration   MRI of brain personally reviewed by me and shows moderate atrophy and no white matter changes  Labs personally reviewed by me  Assessment/Plan: 1.  Ataxia-  Pt physically shows this on exam but origin is difficult to figure out the source;  Due to memory loss and intermittent dizziness there is concern for epileptic phenomena;  Pt could  have a cervical myelopathy that could do this as well.  This is different from her previous episodes of benign positional vertigo but could be like Menieres or labyrithitis as well. 2.  Probable cervical myelopathy-  Symptomatic with pain but could be contributing to ataxia -  MRI of c-spine w/wo contrast -  MRI of brain w/ contrast -  Check B12/folate, ESR, TSH, Vit A and E -  Will give Depacon 521m IV x 1 to see if it helps with ataxia -  Continue PT -  Will follow  Doris Barnes 09/18/2014, 7:56 PM

## 2014-09-18 NOTE — Progress Notes (Signed)
*  PRELIMINARY RESULTS* Echocardiogram 2D Echocardiogram has been performed.  Doris Barnes 09/18/2014, 10:36 AM

## 2014-09-18 NOTE — Progress Notes (Signed)
PT Cancellation Note  Patient Details Name: Doris Barnes MRN: [id number] DOB: 1940-04-09   Cancelled Treatment:    Reason Eval/Treat Not Completed:  (See PT note for further details. ) PT attempted consultation this AM, however pt was not available due to medical testing. Will attempt later depending on time/schedule.    Janyth Contes 09/18/2014, 10:24 AM Janyth Contes, SPT. [phone]

## 2014-09-18 NOTE — Progress Notes (Signed)
Patient ID: Doris Barnes, female   DOB: 05-10-1940, 74 y.o.   MRN: [id number] SUBJECTIVE:  Pt with h/o htn, hld, cervical DDD, anxiety, presented after noting ataxia when getting up from beauty parlor.  No focal weakness noted.  Husband reported word finding difficulty, but pt reports some element of this for some time.  Has had prior injection for neck stiffness; noted left posterior neck pain at time of onset of symptoms as well  ______________________________________________________________________  ROS: Please see HPI; remainder of complete 10 point ROS is negative   Past Medical History  Diagnosis Date  . Hypertension   . Mitral valve prolapse     No past surgical history on file.   Current facility-administered medications:  .  acetaminophen (TYLENOL) tablet 650 mg, 650 mg, Oral, Q6H PRN, 650 mg at 09/18/14 0815 **OR** acetaminophen (TYLENOL) suppository 650 mg, 650 mg, Rectal, Q6H PRN, Bettey Costa, MD .  ALPRAZolam Duanne Moron) tablet 0.25 mg, 0.25 mg, Oral, QHS, Sital Mody, MD, 0.25 mg at 09/17/14 2200 .  alum & mag hydroxide-simeth (MAALOX/MYLANTA) 200-200-20 MG/5ML suspension 30 mL, 30 mL, Oral, Q6H PRN, Bettey Costa, MD .  clopidogrel (PLAVIX) tablet 75 mg, 75 mg, Oral, Daily, Bettey Costa, MD, 75 mg at 09/17/14 1746 .  FLUoxetine (PROZAC) capsule 20 mg, 20 mg, Oral, Daily, Sital Mody, MD, 20 mg at 09/17/14 1746 .  heparin injection 5,000 Units, 5,000 Units, Subcutaneous, 3 times per day, Bettey Costa, MD, 5,000 Units at 09/18/14 0525 .  hydrALAZINE (APRESOLINE) injection 10 mg, 10 mg, Intravenous, Q6H PRN, Bettey Costa, MD .  loratadine (CLARITIN) tablet 10 mg, 10 mg, Oral, Daily, Bettey Costa, MD, 10 mg at 09/17/14 1746 .  LORazepam (ATIVAN) 2 MG/ML injection, , , ,  .  ondansetron (ZOFRAN) tablet 4 mg, 4 mg, Oral, Q6H PRN **OR** ondansetron (ZOFRAN) injection 4 mg, 4 mg, Intravenous, Q6H PRN, Sital Mody, MD .  pantoprazole (PROTONIX) EC tablet 40 mg, 40 mg, Oral, Daily, Bettey Costa, MD,  40 mg at 09/17/14 1737 .  senna-docusate (Senokot-S) tablet 1 tablet, 1 tablet, Oral, QHS PRN, Bettey Costa, MD .  simvastatin (ZOCOR) tablet 20 mg, 20 mg, Oral, q1800, Bettey Costa, MD, 20 mg at 09/17/14 1746  PHYSICAL EXAM:  BP 161/63 mmHg  Pulse 78  Temp(Src) 97.6 F (36.4 C) (Oral)  Resp 18  Ht 5\' 3"  (1.6 m)  Wt 55.021 kg (121 lb 4.8 oz)  BMI 21.49 kg/m2  SpO2 95%  General: Well developed, well nourished female, in NAD HEENT: PERRL; OP moist without lesions. Neck: increased tension, trachea midline, no thyromegaly Chest: normal to palpation Lungs: clear bilaterally without retractions or wheezes Cardiovascular: RRR, no gallop; distal pulses 2+ Abdomen: soft, nontender, nondistended, positive bowel sounds Extremities: no clubbing, cyanosis, edema Neuro: alert and oriented, moves all extremities. No pronator drift.  FNF intact.  Gait not tested Derm: no significant rashes or nodules; good skin turgor Lymph: no cervical or supraclavicular lymphadenopathy  Labs and imaging studies were reviewed  ASSESSMENT/PLAN:   1. Ataxia- MRI, carotids, echo planned; neuro to see.  May be from cervical DDD with pt having neck hyperextension for extended time prior to onset of symptoms; carotid doppler to exclude dissection, given pain.  Kpad to area 2. HTN.  Allowing BP to run slightly high in face of possible acute neurologic event 3 . Anxiety- cont home meds 4. HLD- cont statin therapy

## 2014-09-18 NOTE — Care Management (Signed)
Presents from home with sx concerning for cva.  Brain MRI is negative. PT consult is pending.  Has health insurance.  No issues accessing medical care, obtaining medications, maintaining housing, utilities and food.

## 2014-09-18 NOTE — Evaluation (Signed)
Physical Therapy Evaluation Patient Details Name: Doris Barnes MRN: [id number] DOB: 03-18-1941 Today's Date: 09/18/2014   History of Present Illness  Pt has been having ataxia and staggering along with some word finding issues  Clinical Impression  Pt with new onset ataxia and unsteadiness along with some word finding/memory issues.  MRI appears to be (-) for CVA, but eager to figure it out and get home.  She is able to do some CGA ambulation w/o AD but would be much safer and steadier with a wheeled walker (has a non-wheeled walker at home).      Follow Up Recommendations Home health PT    Equipment Recommendations  Rolling walker with 5" wheels    Recommendations for Other Services       Precautions / Restrictions Precautions Precautions: Fall Restrictions Weight Bearing Restrictions: No      Mobility  Bed Mobility Overal bed mobility: Independent                Transfers Overall transfer level: Modified independent Equipment used: None             General transfer comment: Pt has some minimal unsteadiness on first standing w/o UEs, but is able to maintain balance w/o issue  Ambulation/Gait Ambulation/Gait assistance: Min guard;Min assist Ambulation Distance (Feet): 75 Feet Assistive device: None       General Gait Details: Pt has regular stagger steps (appears to be more to the L than R) but she is able to self arrest and has no signficant LOBs.  She will benefit from using a walker howerver, as she was inconsistent, ataxic and unsteady  Science writer    Modified Rankin (Stroke Patients Only)       Balance Overall balance assessment:  (able to maintain balance but shows inconsistent unsteadiness)                                           Pertinent Vitals/Pain      Home Living Family/patient expects to be discharged to:: Private residence Living Arrangements: Spouse/significant  other Available Help at Discharge: Family Type of Home: House Home Access: Stairs to enter   Technical brewer of Steps: 3          Prior Function Level of Independence: Independent         Comments: PT has not had any falls, but reports that she has had balance issues in the past     Hand Dominance        Extremity/Trunk Assessment   Upper Extremity Assessment: Overall WFL for tasks assessed           Lower Extremity Assessment: Overall WFL for tasks assessed         Communication      Cognition Arousal/Alertness: Awake/alert Behavior During Therapy: WFL for tasks assessed/performed Overall Cognitive Status: Impaired/Different from baseline (Pt has some minimal baseline expressive aphasia, now worse)                      General Comments      Exercises        Assessment/Plan    PT Assessment    PT Diagnosis Abnormality of gait   PT Problem List    PT Treatment Interventions     PT Goals (Current goals can  be found in the Care Plan section) Acute Rehab PT Goals Patient Stated Goal: "I want to get back home" PT Goal Formulation: With patient Time For Goal Achievement: 10/02/14 Potential to Achieve Goals: Good    Frequency     Barriers to discharge        Co-evaluation               End of Session Equipment Utilized During Treatment: Gait belt Activity Tolerance: Patient tolerated treatment well Patient left: with bed alarm set           Time: 7824-2353 PT Time Calculation (min) (ACUTE ONLY): 21 min   Charges:   PT Evaluation $Initial PT Evaluation Tier I: 1 Procedure     PT G Codes:       Doris Barnes, PT, DPT [phone]  Doris Barnes 09/18/2014, 5:58 PM

## 2014-09-19 ENCOUNTER — Observation Stay: Payer: Commercial Managed Care - HMO

## 2014-09-19 DIAGNOSIS — I10 Essential (primary) hypertension: Secondary | ICD-10-CM | POA: Diagnosis not present

## 2014-09-19 DIAGNOSIS — E785 Hyperlipidemia, unspecified: Secondary | ICD-10-CM | POA: Diagnosis not present

## 2014-09-19 DIAGNOSIS — K219 Gastro-esophageal reflux disease without esophagitis: Secondary | ICD-10-CM | POA: Diagnosis not present

## 2014-09-19 DIAGNOSIS — R531 Weakness: Secondary | ICD-10-CM | POA: Diagnosis not present

## 2014-09-19 DIAGNOSIS — I341 Nonrheumatic mitral (valve) prolapse: Secondary | ICD-10-CM | POA: Diagnosis not present

## 2014-09-19 DIAGNOSIS — M4802 Spinal stenosis, cervical region: Secondary | ICD-10-CM | POA: Diagnosis not present

## 2014-09-19 DIAGNOSIS — R51 Headache: Secondary | ICD-10-CM | POA: Diagnosis not present

## 2014-09-19 DIAGNOSIS — H5500 Unspecified nystagmus: Secondary | ICD-10-CM | POA: Diagnosis not present

## 2014-09-19 DIAGNOSIS — F419 Anxiety disorder, unspecified: Secondary | ICD-10-CM | POA: Diagnosis not present

## 2014-09-19 DIAGNOSIS — R413 Other amnesia: Secondary | ICD-10-CM | POA: Diagnosis not present

## 2014-09-19 DIAGNOSIS — R270 Ataxia, unspecified: Secondary | ICD-10-CM | POA: Diagnosis not present

## 2014-09-19 DIAGNOSIS — M5033 Other cervical disc degeneration, cervicothoracic region: Secondary | ICD-10-CM | POA: Diagnosis not present

## 2014-09-19 LAB — BASIC METABOLIC PANEL
Anion gap: 5 (ref 5–15)
BUN: 13 mg/dL (ref 6–20)
CO2: 29 mmol/L (ref 22–32)
Calcium: 8.9 mg/dL (ref 8.9–10.3)
Chloride: 106 mmol/L (ref 101–111)
Creatinine, Ser: 0.71 mg/dL (ref 0.44–1.00)
GFR calc Af Amer: >60 mL/min (ref >60)
GFR calc non Af Amer: >60 mL/min (ref >60)
Glucose, Bld: 102 mg/dL — ABNORMAL HIGH (ref 65–99)
Potassium: 3.8 mmol/L (ref 3.5–5.1)
Sodium: 140 mmol/L (ref 135–145)

## 2014-09-19 LAB — VITAMIN B12: Vitamin B-12: 315 pg/mL (ref 180–914)

## 2014-09-19 MED ORDER — CLONAZEPAM 0.5 MG PO TABS
0.5000 mg | ORAL_TABLET | Freq: Two times a day (BID) | ORAL | Status: DC
  Administered 2014-09-20 – 2014-09-21 (×3): 0.5 mg via ORAL
  Filled 2014-09-19 (×3): qty 1

## 2014-09-19 MED ORDER — GADOBENATE DIMEGLUMINE 529 MG/ML IV SOLN
15.0000 mL | Freq: Once | INTRAVENOUS | Status: AC | PRN
  Administered 2014-09-19: 11 mL via INTRAVENOUS

## 2014-09-19 MED ORDER — LORAZEPAM 2 MG/ML IJ SOLN
1.0000 mg | Freq: Once | INTRAMUSCULAR | Status: AC
  Administered 2014-09-19: 1 mg via INTRAVENOUS
  Filled 2014-09-19: qty 1

## 2014-09-19 NOTE — Progress Notes (Signed)
Neurology Note  S: Pt states that her dizziness is a little worse today especially when she gets up.  She feels like her head is full.  There is questionable hearing loss.  ROS neg x 8 systems except for neck pain  O: AFVSS NL weight, no distress Normocephalic, oropharynx clear, ears without effusion CTA B, no wheezing RRR, no murmur No C/C/E  A+Ox3, nl speech and langauge PERRLA, EOMI with mild nystagmus on lateral gaze, face symmetric;  Weber localizes to [employer] all ext equally 5/5, mild tremor FTN and HTS WNL, + Dix Hallpike  MRI of brain and c-spine personally reviewed by me and show only mild cervical stenosis  A/P: 1. Probable peripheral vertigo-  Pt is not the best historian but suspect possible labyrithitis or even Menieres as she appears to have some hearing loss.  No response to Depakote goes against epileptic phenomena 2.  Probable dementia-  Not sure which type at this time -  Start clonazepam 0.5mg  BID -  Continue PT -  Needs neuropsychiatric testing as outpatient -  Ok to d/c if pt feels better but pt will need to f/u with [facility] Neuro in 4-6 weeks

## 2014-09-19 NOTE — Progress Notes (Signed)
Spoke with Dr Tamala Julian confirming MRI of brain today.  MD confirmed wanting test done. Lynnda Shields, RN

## 2014-09-19 NOTE — Progress Notes (Signed)
PT Cancellation Note  Patient Details Name: VITA CURRIN MRN: [id number] DOB: November 18, 1940   Cancelled Treatment:    Reason Eval/Treat Not Completed:  (pt feeling v. dizzy today, wants to wait until tomorrow)   Kreg Shropshire 09/19/2014, 2:45 PM

## 2014-09-19 NOTE — Progress Notes (Signed)
Patient ID: Doris Barnes, female   DOB: 08-26-1940, 74 y.o.   MRN: [id number] SUBJECTIVE:  Pt with h/o htn, hld, cervical DDD, anxiety, presented after noting ataxia when getting up from beauty parlor.  No focal weakness noted.  Husband reported word finding difficulty, but pt reports some element of this for some time.  Has had prior injection for neck stiffness; noted left posterior neck pain at time of onset of symptoms as well Since admit still with ataxia but a little better. No HA, no fevers. No dizzyness  ______________________________________________________________________  ROS: Please see HPI; remainder of complete 10 point ROS is negative   Past Medical History  Diagnosis Date  . Hypertension   . Mitral valve prolapse     No past surgical history on file.   Current facility-administered medications:  .  acetaminophen (TYLENOL) tablet 650 mg, 650 mg, Oral, Q6H PRN, 650 mg at 09/18/14 0815 **OR** acetaminophen (TYLENOL) suppository 650 mg, 650 mg, Rectal, Q6H PRN, Bettey Costa, MD .  ALPRAZolam Duanne Moron) tablet 0.25 mg, 0.25 mg, Oral, QHS, Sital Mody, MD, 0.25 mg at 09/18/14 2120 .  alum & mag hydroxide-simeth (MAALOX/MYLANTA) 200-200-20 MG/5ML suspension 30 mL, 30 mL, Oral, Q6H PRN, Bettey Costa, MD .  clopidogrel (PLAVIX) tablet 75 mg, 75 mg, Oral, Daily, Sital Mody, MD, 75 mg at 09/19/14 1041 .  FLUoxetine (PROZAC) capsule 20 mg, 20 mg, Oral, Daily, Sital Mody, MD, 20 mg at 09/19/14 1041 .  heparin injection 5,000 Units, 5,000 Units, Subcutaneous, 3 times per day, Bettey Costa, MD, 5,000 Units at 09/19/14 0543 .  hydrALAZINE (APRESOLINE) injection 10 mg, 10 mg, Intravenous, Q6H PRN, Bettey Costa, MD .  loratadine (CLARITIN) tablet 10 mg, 10 mg, Oral, Daily, Sital Mody, MD, 10 mg at 09/19/14 1041 .  ondansetron (ZOFRAN) tablet 4 mg, 4 mg, Oral, Q6H PRN **OR** ondansetron (ZOFRAN) injection 4 mg, 4 mg, Intravenous, Q6H PRN, Sital Mody, MD .  pantoprazole (PROTONIX) EC tablet 40 mg, 40  mg, Oral, Daily, Sital Mody, MD, 40 mg at 09/19/14 1041 .  senna-docusate (Senokot-S) tablet 1 tablet, 1 tablet, Oral, QHS PRN, Bettey Costa, MD .  simvastatin (ZOCOR) tablet 20 mg, 20 mg, Oral, q1800, Bettey Costa, MD, 20 mg at 09/18/14 1715  PHYSICAL EXAM:  BP 162/72 mmHg  Pulse 78  Temp(Src) 98.5 F (36.9 C) (Oral)  Resp 18  Ht 5\' 3"  (1.6 m)  Wt 55.43 kg (122 lb 3.2 oz)  BMI 21.65 kg/m2  SpO2 96%  General: Well developed, well nourished female, in NAD HEENT: PERRL; OP moist without lesions. Neck: increased tension, trachea midline, no thyromegaly Chest: normal to palpation Lungs: clear bilaterally without retractions or wheezes Cardiovascular: RRR, no gallop; distal pulses 2+ Abdomen: soft, nontender, nondistended, positive bowel sounds Extremities: no clubbing, cyanosis, edema Neuro: alert and oriented, moves all extremities. No pronator drift.  FNF intact.  Gait still very unsteady Derm: no significant rashes or nodules; good skin turgor Lymph: no cervical or supraclavicular lymphadenopathy  Labs and imaging studies were reviewed Study Conclusions  - Left ventricle: There was moderate concentric hypertrophy. Systolic function was normal. The estimated ejection fraction was in the range of 55% to 65%. - Aortic valve: Valve area (Vmax): 1.46 cm^2. - Mitral valve: There was mild to moderate regurgitation. ASSESSMENT/PLAN:  1. Ataxia- MRI brain unimpressive except old CVID, carotids unimpressive, echo relatively nml -neuro consult appreciated.   Getting MRI c spine and MRI brain with contrast 2. HTN.  Allowing BP to run slightly high  in face of possible acute neurologic event 3 . Anxiety- cont home meds 4. HLD- cont statin therapy

## 2014-09-20 DIAGNOSIS — F419 Anxiety disorder, unspecified: Secondary | ICD-10-CM | POA: Diagnosis not present

## 2014-09-20 DIAGNOSIS — H5500 Unspecified nystagmus: Secondary | ICD-10-CM | POA: Diagnosis not present

## 2014-09-20 DIAGNOSIS — I341 Nonrheumatic mitral (valve) prolapse: Secondary | ICD-10-CM | POA: Diagnosis not present

## 2014-09-20 DIAGNOSIS — R531 Weakness: Secondary | ICD-10-CM | POA: Diagnosis not present

## 2014-09-20 DIAGNOSIS — E785 Hyperlipidemia, unspecified: Secondary | ICD-10-CM | POA: Diagnosis not present

## 2014-09-20 DIAGNOSIS — R413 Other amnesia: Secondary | ICD-10-CM | POA: Diagnosis not present

## 2014-09-20 DIAGNOSIS — K219 Gastro-esophageal reflux disease without esophagitis: Secondary | ICD-10-CM | POA: Diagnosis not present

## 2014-09-20 DIAGNOSIS — R270 Ataxia, unspecified: Secondary | ICD-10-CM | POA: Diagnosis not present

## 2014-09-20 DIAGNOSIS — I10 Essential (primary) hypertension: Secondary | ICD-10-CM | POA: Diagnosis not present

## 2014-09-20 MED ORDER — HYDROCHLOROTHIAZIDE 25 MG PO TABS
25.0000 mg | ORAL_TABLET | Freq: Every day | ORAL | Status: DC
  Administered 2014-09-20 – 2014-09-21 (×2): 25 mg via ORAL
  Filled 2014-09-20 (×2): qty 1

## 2014-09-20 MED ORDER — ATENOLOL 25 MG PO TABS
50.0000 mg | ORAL_TABLET | Freq: Every day | ORAL | Status: DC
  Administered 2014-09-20 – 2014-09-21 (×2): 50 mg via ORAL
  Filled 2014-09-20 (×2): qty 2

## 2014-09-20 NOTE — Progress Notes (Signed)
Patient ID: Doris Barnes, female   DOB: April 12, 1940, 74 y.o.   MRN: [id number] SUBJECTIVE:  Pt with h/o htn, hld, cervical DDD, anxiety, presented after noting ataxia when getting up from beauty parlor.  No focal weakness noted.  Husband reported word finding difficulty, but pt reports some element of this for some time.  Has had prior injection for neck stiffness; noted left posterior neck pain at time of onset of symptoms as well Since admit still with ataxia but a little better. MRIs unimpressive  ______________________________________________________________________  ROS: Please see HPI; remainder of complete 10 point ROS is negative   Past Medical History  Diagnosis Date  . Hypertension   . Mitral valve prolapse     No past surgical history on file.   Current facility-administered medications:  .  acetaminophen (TYLENOL) tablet 650 mg, 650 mg, Oral, Q6H PRN, 650 mg at 09/19/14 1422 **OR** acetaminophen (TYLENOL) suppository 650 mg, 650 mg, Rectal, Q6H PRN, Bettey Costa, MD .  alum & mag hydroxide-simeth (MAALOX/MYLANTA) 200-200-20 MG/5ML suspension 30 mL, 30 mL, Oral, Q6H PRN, Bettey Costa, MD .  clonazePAM (KLONOPIN) tablet 0.5 mg, 0.5 mg, Oral, BID, Valora Corporal, MD, 0.5 mg at 09/20/14 0906 .  clopidogrel (PLAVIX) tablet 75 mg, 75 mg, Oral, Daily, Bettey Costa, MD, 75 mg at 09/20/14 0906 .  FLUoxetine (PROZAC) capsule 20 mg, 20 mg, Oral, Daily, Sital Mody, MD, 20 mg at 09/20/14 0906 .  heparin injection 5,000 Units, 5,000 Units, Subcutaneous, 3 times per day, Bettey Costa, MD, 5,000 Units at 09/20/14 0545 .  hydrALAZINE (APRESOLINE) injection 10 mg, 10 mg, Intravenous, Q6H PRN, Bettey Costa, MD .  loratadine (CLARITIN) tablet 10 mg, 10 mg, Oral, Daily, Bettey Costa, MD, 10 mg at 09/20/14 0906 .  ondansetron (ZOFRAN) tablet 4 mg, 4 mg, Oral, Q6H PRN **OR** ondansetron (ZOFRAN) injection 4 mg, 4 mg, Intravenous, Q6H PRN, Sital Mody, MD .  pantoprazole (PROTONIX) EC tablet 40 mg, 40 mg, Oral,  Daily, Bettey Costa, MD, 40 mg at 09/20/14 0906 .  senna-docusate (Senokot-S) tablet 1 tablet, 1 tablet, Oral, QHS PRN, Bettey Costa, MD .  simvastatin (ZOCOR) tablet 20 mg, 20 mg, Oral, q1800, Bettey Costa, MD, 20 mg at 09/19/14 1742  PHYSICAL EXAM:  BP 161/83 mmHg  Pulse 83  Temp(Src) 97.7 F (36.5 C) (Oral)  Resp 18  Ht 5\' 3"  (1.6 m)  Wt 55.43 kg (122 lb 3.2 oz)  BMI 21.65 kg/m2  SpO2 94%  General: Well developed, well nourished female, in NAD HEENT: PERRL; OP moist without lesions. Neck: increased tension, trachea midline, no thyromegaly Chest: normal to palpation Lungs: clear bilaterally without retractions or wheezes Cardiovascular: RRR, no gallop; distal pulses 2+ Abdomen: soft, nontender, nondistended, positive bowel sounds Extremities: no clubbing, cyanosis, edema Neuro: alert and oriented, moves all extremities. No pronator drift.  FNF intact.  Gait still very unsteady Derm: no significant rashes or nodules; good skin turgor Lymph: no cervical or supraclavicular lymphadenopathy  Labs and imaging studies were reviewed Study Conclusions  - Left ventricle: There was moderate concentric hypertrophy. Systolic function was normal. The estimated ejection fraction was in the range of 55% to 65%. - Aortic valve: Valve area (Vmax): 1.46 cm^2. - Mitral valve: There was mild to moderate regurgitation. ASSESSMENT/PLAN:  1. Ataxia- MRI brain and C spine  unimpressive except old CVID, carotids unimpressive, echo relatively nml -neuro consult appreciated.   Likely peripheral vertigo  2. HTN.  Restart hctz and atenolol 3 . Anxiety- cont home meds -  neuro added clonazepam 4. HLD- cont statin therapy 5. Possible dementia - will need otpt Neuro eval  Dispo - needs PT eval for decision but was too dizzy yesterday to participate per note

## 2014-09-20 NOTE — Care Management Note (Addendum)
Case Management Note  Patient Details  Name: Doris Barnes MRN: [id number] Date of Birth: [birth year]  Subjective/Objective:                   Spoke with patient by phone to assist with discharge planning. Patient anticipates discharge back to home with her husband. She does not own a rolling walker. Her PCP is Dr. Emily Filbert. She agrees to home health and would like to use [facility].   Action/Plan:  List of home health providers shared with patient that is in-network with her insurance- she picked [facility]. She will need a rolling walker prior to discharge. Patient is unsure of discharge date. RNCM to continue to follow. MD please write home health PT orders along with Face-to-Face to assist with discharge needs.   Expected Discharge Date:                  Expected Discharge Plan:     In-House Referral:     Discharge planning Services  CM Consult  Post Acute Care Choice:    Choice offered to:  Patient  DME Arranged:  [facility] DME Agency:  [place]:  PT [facility] Agency:  [facility]  Status of Service:     Medicare Important Message Given:  Yes Date Medicare IM Given:  09/18/14 Medicare IM give by:  Joni Reining Date Additional Medicare IM Given:    Additional Medicare Important Message give by:     If discussed at [place] of Stay Meetings, dates discussed:    Additional Comments:  Marshell Garfinkel, RN 09/20/2014, 12:59 PM

## 2014-09-20 NOTE — Progress Notes (Signed)
VSS. Up to BR and tolerated it well. NSR. Room air. Takes meds ok. Pt has not reported any pain. A & O. Husband at the bedside. No seizure activity. Pt has no further concerns at this time.

## 2014-09-21 DIAGNOSIS — R413 Other amnesia: Secondary | ICD-10-CM | POA: Diagnosis not present

## 2014-09-21 DIAGNOSIS — E785 Hyperlipidemia, unspecified: Secondary | ICD-10-CM | POA: Diagnosis not present

## 2014-09-21 DIAGNOSIS — K219 Gastro-esophageal reflux disease without esophagitis: Secondary | ICD-10-CM | POA: Diagnosis not present

## 2014-09-21 DIAGNOSIS — H5500 Unspecified nystagmus: Secondary | ICD-10-CM | POA: Diagnosis not present

## 2014-09-21 DIAGNOSIS — I10 Essential (primary) hypertension: Secondary | ICD-10-CM | POA: Diagnosis not present

## 2014-09-21 DIAGNOSIS — R270 Ataxia, unspecified: Secondary | ICD-10-CM | POA: Diagnosis not present

## 2014-09-21 DIAGNOSIS — F419 Anxiety disorder, unspecified: Secondary | ICD-10-CM | POA: Diagnosis not present

## 2014-09-21 DIAGNOSIS — I341 Nonrheumatic mitral (valve) prolapse: Secondary | ICD-10-CM | POA: Diagnosis not present

## 2014-09-21 DIAGNOSIS — R531 Weakness: Secondary | ICD-10-CM | POA: Diagnosis not present

## 2014-09-21 MED ORDER — CLONAZEPAM 0.5 MG PO TABS: 0.5000 mg | ORAL_TABLET | Freq: Two times a day (BID) | ORAL | Status: DC

## 2014-09-21 MED ORDER — FLUTICASONE PROPIONATE 50 MCG/ACT NA SUSP
2.0000 | Freq: Every day | NASAL | Status: DC
  Administered 2014-09-21: 2 via NASAL
  Filled 2014-09-21: qty 16

## 2014-09-21 MED ORDER — MECLIZINE HCL 12.5 MG PO TABS
12.5000 mg | ORAL_TABLET | Freq: Three times a day (TID) | ORAL | Status: DC
  Filled 2014-09-21 (×2): qty 1

## 2014-09-21 MED ORDER — MECLIZINE HCL 12.5 MG PO TABS: 12.5000 mg | ORAL_TABLET | Freq: Three times a day (TID) | ORAL | Status: DC

## 2014-09-21 NOTE — Progress Notes (Signed)
Patient ID: Doris Barnes, female   DOB: 1940-11-26, 74 y.o.   MRN: [id number] Patient ID: Doris Barnes, female   DOB: [birth year], 74 y.o.   MRN: [id number] SUBJECTIVE:  Pt with h/o htn, hld, cervical DDD, anxiety, presented after noting ataxia when getting up from beauty parlor.  No focal weakness noted.  Husband reported word finding difficulty, but pt reports some element of this for some time. The brain MRI with contrast unremarkable, cervical MRI shows some arthritis but nothing of significance. Patient feels some off-balance feelings with still ataxia, slightly better since admission ______________________________________________________________________  ROS: Please see HPI; remainder of complete 10 point ROS is negative   Past Medical History  Diagnosis Date  . Hypertension   . Mitral valve prolapse     No past surgical history on file.   Current facility-administered medications:  .  acetaminophen (TYLENOL) tablet 650 mg, 650 mg, Oral, Q6H PRN, 650 mg at 09/19/14 1422 **OR** acetaminophen (TYLENOL) suppository 650 mg, 650 mg, Rectal, Q6H PRN, Bettey Costa, MD .  alum & mag hydroxide-simeth (MAALOX/MYLANTA) 200-200-20 MG/5ML suspension 30 mL, 30 mL, Oral, Q6H PRN, Bettey Costa, MD .  atenolol (TENORMIN) tablet 50 mg, 50 mg, Oral, Daily, Adrian Prows, MD, 50 mg at 09/20/14 1103 .  clonazePAM (KLONOPIN) tablet 0.5 mg, 0.5 mg, Oral, BID, Valora Corporal, MD, 0.5 mg at 09/20/14 2301 .  clopidogrel (PLAVIX) tablet 75 mg, 75 mg, Oral, Daily, Bettey Costa, MD, 75 mg at 09/20/14 0906 .  FLUoxetine (PROZAC) capsule 20 mg, 20 mg, Oral, Daily, Bettey Costa, MD, 20 mg at 09/20/14 0906 .  fluticasone (FLONASE) 50 MCG/ACT nasal spray 2 spray, 2 spray, Each Nare, Daily, Rusty Aus, MD .  heparin injection 5,000 Units, 5,000 Units, Subcutaneous, 3 times per day, Bettey Costa, MD, 5,000 Units at 09/21/14 0544 .  hydrALAZINE (APRESOLINE) injection 10 mg, 10 mg, Intravenous, Q6H PRN, Bettey Costa, MD .   hydrochlorothiazide (HYDRODIURIL) tablet 25 mg, 25 mg, Oral, Daily, Adrian Prows, MD, 25 mg at 09/20/14 1103 .  loratadine (CLARITIN) tablet 10 mg, 10 mg, Oral, Daily, Bettey Costa, MD, 10 mg at 09/20/14 0906 .  ondansetron (ZOFRAN) tablet 4 mg, 4 mg, Oral, Q6H PRN **OR** ondansetron (ZOFRAN) injection 4 mg, 4 mg, Intravenous, Q6H PRN, Sital Mody, MD .  pantoprazole (PROTONIX) EC tablet 40 mg, 40 mg, Oral, Daily, Bettey Costa, MD, 40 mg at 09/20/14 0906 .  senna-docusate (Senokot-S) tablet 1 tablet, 1 tablet, Oral, QHS PRN, Bettey Costa, MD .  simvastatin (ZOCOR) tablet 20 mg, 20 mg, Oral, q1800, Bettey Costa, MD, 20 mg at 09/20/14 1640  PHYSICAL EXAM:  BP 136/60 mmHg  Pulse 80  Temp(Src) 97.8 F (36.6 C) (Oral)  Resp 18  Ht 5\' 3"  (1.6 m)  Wt 55.43 kg (122 lb 3.2 oz)  BMI 21.65 kg/m2  SpO2 98%  General: Well developed, well nourished female, in NAD HEENT: PERRL; OP moist without lesions. Neck: increased tension, trachea midline, no thyromegaly Chest: normal to palpation Lungs: clear bilaterally without retractions or wheezes Cardiovascular: RRR, no gallop; distal pulses 2+ Abdomen: soft, nontender, nondistended, positive bowel sounds Extremities: no clubbing, cyanosis, edema Neuro: alert and oriented, moves all extremities. No pronator drift.  FNF intact.  Gait still very unsteady Derm: no significant rashes or nodules; good skin turgor Lymph: no cervical or supraclavicular lymphadenopathy  Labs and imaging studies were reviewed Study Conclusions  - Left ventricle: There was moderate concentric hypertrophy. Systolic function was normal. The estimated ejection fraction  was in the range of 55% to 65%. - Aortic valve: Valve area (Vmax): 1.46 cm^2. - Mitral valve: There was mild to moderate regurgitation. ASSESSMENT/PLAN:  1. Ataxia- MRI brain and C spine  unimpressive except old CVID, carotids unimpressive, echo relatively nml -neuro consult appreciated.   Likely peripheral  vertigo  2. HTN.  Restart hctz and atenolol 3 . Anxiety- cont home meds - neuro added clonazepam 4. HLD- cont statin therapy 5. Possible dementia - will need otpt Neuro eval  ENT evaluation today, and Flonase, plan home tomorrow, likely sinus related, central workup negative thus far

## 2014-09-21 NOTE — Progress Notes (Signed)
   09/18/14 1700  PT Visit Information  Last PT Received On 09/18/14  Assistance Needed +1  History of Present Illness Pt has been having ataxia and staggering along with some word finding issues  Precautions  Precautions Fall  Restrictions  Weight Bearing Restrictions No  Home Living  Family/patient expects to be discharged to: Private residence  Living Arrangements Spouse/significant other  Available Help at Discharge Family  Type of [place] to enter  Entrance Stairs-Number of Steps 3  Prior Function  Level of Independence Independent  Comments PT has not had any falls, but reports that she has had balance issues in the past  Cognition  Arousal/Alertness Awake/alert  Behavior During Therapy [facility] for tasks assessed/performed  Overall Cognitive Status Impaired/Different from baseline (Pt has some minimal baseline expressive aphasia, now worse)  Upper Extremity Assessment  Upper Extremity Assessment Overall WFL for tasks assessed  Lower Extremity Assessment  Lower Extremity Assessment Overall WFL for tasks assessed  Bed Mobility  Overal bed mobility Independent  Transfers  Overall transfer level Modified independent  Equipment used None  General transfer comment Pt has some minimal unsteadiness on first standing w/o UEs, but is able to maintain balance w/o issue  Ambulation/Gait  Ambulation/Gait assistance Min guard;Min assist  Ambulation Distance (Feet) 75 Feet  Assistive device None  General Gait Details Pt has regular stagger steps (appears to be more to the L than R) but she is able to self arrest and has no signficant LOBs.  She will benefit from using a walker howerver, as she was inconsistent, ataxic and unsteady  Balance  Overall balance assessment (able to maintain balance but shows inconsistent unsteadiness)  PT - End of Session  Equipment Utilized During Treatment Gait belt  Activity Tolerance Patient tolerated treatment well  Patient left  with bed alarm set  PT Assessment  PT Therapy Diagnosis  Abnormality of gait  PT Recommendation/Assessment Patient needs continued PT services  PT Problem List Decreased balance;Decreased mobility;Decreased coordination;Decreased cognition;Decreased strength;Decreased safety awareness  PT Plan  PT Frequency (ACUTE ONLY) Min 2X/week  PT Treatment/Interventions (ACUTE ONLY) Therapeutic exercise;Therapeutic activities;Functional mobility training;Balance training;Cognitive remediation  PT Recommendation  Follow Up Recommendations Home health PT  PT equipment Rolling walker with 5" wheels  Individuals Consulted  Consulted and Agree with Results and Recommendations Patient  Acute Rehab PT Goals  Patient Stated Goal "I want to get back home"  PT Goal Formulation With patient  Time For Goal Achievement 10/02/14  Potential to Achieve Goals Good  PT Time Calculation  PT Start Time (ACUTE ONLY) 1605  PT Stop Time (ACUTE ONLY) 1626  PT Time Calculation (min) (ACUTE ONLY) 21 min  PT G-Codes **NOT FOR INPATIENT CLASS**  Functional Assessment Tool Used clinical judgment  Functional Limitation Mobility: Walking and moving around  Mobility: Walking and Moving Around Current Status (O3785) CI  Mobility: Walking and Moving Around Goal Status (Y8502) CI  PT General Charges  $$ ACUTE PT VISIT 1 Procedure  PT Evaluation  $Initial PT Evaluation Tier I 1 Procedure   Late entry for missed G-code. Based on review of the evaluation and goals by Wayne Both, PT, DPT. Lady Deutscher PT, DPT 9:34 AM 09/21/2014

## 2014-09-21 NOTE — Discharge Summary (Signed)
                                                                                    Doris Barnes, is a 74 y.o. female  DOB Jun 28, 1940  MRN [id number].  Admission date:  09/17/2014  Admitting Physician  Bettey Costa, MD  Discharge Date:  09/21/2014    Admission Diagnosis  Ataxia [R27.0]  Discharge Diagnoses   Ataxia Hypertension   Past Medical History  Diagnosis Date  . Hypertension   . Mitral valve prolapse     No past surgical history on file.     History of present illness and  Hospital Course:     Kindly see H&P for history of present illness and admission details, please review complete Labs, Consult reports and Test reports for all details in brief  HPI  from the history and physical done on the day of admission    Hospital Course    Pt admitted and Brain MRI and cervical MRIs were unremarkable. Neurology thought that ataxia was peripheral, not central and added clonazepam and meclizine. Pt reports that she is some better over the weekend but still somewhat symptomatic,leaning to one side predominantly.   Discharge Condition: stable   Follow UP  Dr Josue Hector    Discharge Instructions  and  Discharge Medications  Clonazepam 0.5mg  bid Meclizine 12.5mg  tid Aspirin 81mg  daily Atenolol 50mg  daily Claritin 10mg  daily Prozac 20mg  daily HCTZ 12.5 mg daily Prevacid 30mg  daily Simvastatin 20mg  bedtime    Today   Subjective:   Doris Barnes today still reports some ataxia but moderately better from admission Objective:   Blood pressure 138/72, pulse 65, temperature 98.1 F (36.7 C), temperature source Oral, resp. rate 18, height 5\' 3"  (1.6 m), weight 55.43 kg (122 lb 3.2 oz), SpO2 97 %.    Total Time in preparing paper work, data evaluation and todays exam - 35 minutes  MILLER,MARK F. M.D on 09/21/2014 at 5:03 PM

## 2014-09-21 NOTE — Care Management (Signed)
ENT consult is pending.  Patient remains  Under observation.  Have notified attending of need for home health physical therapy order and script for front wheeled rolling walker

## 2014-09-21 NOTE — Progress Notes (Signed)
Physical Therapy Treatment Patient Details Name: Doris Barnes MRN: [id number] DOB: 08-07-1940 Today's Date: 09/21/2014    History of Present Illness Pt has been having ataxia and staggering along with some word finding issues    PT Comments    Pt progressing towards goals, evident by her self-reported improvement in balance with activities. Pt able to ambulate 300 ft with CGA and occasional LOB requiring min assist. Pt is independent with bed mobility and transfers mod independent with RW for stability. Pt still requires skilled PT for improvement of dynamic balance for prevention of fall. It is the PT's opinion that without the RW, pt would not be able to ambulate without frequent falls. This was discussed with the pt, and although she notes she has improved greatly, she agrees with this.  This entire session was guided, instructed, and directly supervised by Greggory Stallion, DPT.   Follow Up Recommendations  Home health PT     Equipment Recommendations  Rolling walker with 5" wheels    Recommendations for Other Services       Precautions / Restrictions Precautions Precautions: Fall Restrictions Weight Bearing Restrictions: No    Mobility  Bed Mobility Overal bed mobility: Independent             General bed mobility comments: Pt performs all bed mobility without assistance  Transfers Overall transfer level: Modified independent Equipment used: Rolling walker (2 wheeled)             General transfer comment: Pt able to transfer with modified independence with RW and no noted unsteadiness with rise or finish of transfer.   Ambulation/Gait Ambulation/Gait assistance: Min guard Ambulation Distance (Feet): 300 Feet Assistive device: Rolling walker (2 wheeled) Gait Pattern/deviations: Staggering left;Staggering right Gait velocity: decreased   General Gait Details: Pt able to ambulate 300 ft at [facility] for safety within the hospital with no noted feelings of  dizziness. Pt stated that she felt like she was stearing towards the right, however it was noted by PT that pt would tend to deviate to the left. She had lateral LOBs on multiple occasions but was able to self-correct without therapist helping. Pt stated that her walking felt much better this afternoon. She also stated that she felt more stable with the RW.   Stairs            Wheelchair Mobility    Modified Rankin (Stroke Patients Only)       Balance Overall balance assessment: Needs assistance Sitting-balance support: No upper extremity supported Sitting balance-Leahy Scale: Normal     Standing balance support: No upper extremity supported Standing balance-Leahy Scale: Good                 High Level Balance Comments: Balance deficits only apparent during ambulation and dynamic reaching activities    Cognition Arousal/Alertness: Awake/alert Behavior During Therapy: WFL for tasks assessed/performed Overall Cognitive Status: Within Functional Limits for tasks assessed                      Exercises Other Exercises Other Exercises: Pt performed 2 x 10 bilateral reaching with UE and without opposing UE support outside BOS in all planes. She required CGA for safety and only 2 instances of min assist for LOB correction. She appears to have the ability to self-correct herself  with assistance of the RW.   Other Exercises: Pt performed     General Comments        Pertinent Vitals/Pain Pain  Assessment: No/denies pain    Home Living                      Prior Function            PT Goals (current goals can now be found in the care plan section) Acute Rehab PT Goals Patient Stated Goal: Wishes to walk PT Goal Formulation: With patient Time For Goal Achievement: 10/02/14 Potential to Achieve Goals: Good Progress towards PT goals: Progressing toward goals    Frequency  Min 2X/week    PT Plan Current plan remains appropriate     Co-evaluation             End of Session Equipment Utilized During Treatment: Gait belt Activity Tolerance: Patient tolerated treatment well Patient left: in bed;with call bell/phone within reach     Time: 2951-8841 PT Time Calculation (min) (ACUTE ONLY): 23 min  Charges:                       G CodesJanyth Contes 10/06/14, 5:03 PM  Janyth Contes, SPT. [phone]

## 2014-09-21 NOTE — Progress Notes (Addendum)
Room air. A fib. Takes meds ok.. Ambulating in the hallway. Pt has not reported any pain. Neuro saw pt today. Pt has no further concerns at this time. IV and tele removed. Discharge instructions given to pt.

## 2014-09-21 NOTE — Progress Notes (Signed)
Neurology Note  S: Improved dizziness and was able to ambulate to bathroom for the first time today  ROS neg x 8 systems  O: AFVSS NL weight, no distress Normocephalic, oropharynx clear, ears without effusion CTA B, no wheezing RRR, no murmur No C/C/E  A+Ox3, nl speech and langauge PERRLA, EOMI with mild nystagmus on lateral gaze to L only, face symmetric Moves all ext equally 5/5, mild tremor FTN and HTS WNL   A/P: 1. Probable peripheral vertigo- improving, Pt is not the best historian but suspect possible labyrithitis or even [place] as she appears to have some hearing loss. No response to Depakote goes against epileptic phenomena 2. Probable dementia- Not sure which type at this time -  OK to get ENT consult -  Add meclizine 12.5mg  TID - continue clonazepam 0.5mg  BID - Continue PT - Needs neuropsychiatric testing as outpatient - Ok to d/c if pt feels better but pt will need to f/u with [facility] Neuro in 4-6 weeks

## 2014-09-21 NOTE — Progress Notes (Signed)
Pt rested well during evening. Denied pain on assessment. VSS. Ambulates to bathroom. Plan is d/c to home. No acute distress, will cont to monitor.

## 2014-09-23 DIAGNOSIS — R260 Ataxic gait: Secondary | ICD-10-CM | POA: Diagnosis not present

## 2014-09-23 DIAGNOSIS — M5430 Sciatica, unspecified side: Secondary | ICD-10-CM | POA: Diagnosis not present

## 2014-09-30 DIAGNOSIS — R42 Dizziness and giddiness: Secondary | ICD-10-CM | POA: Diagnosis not present

## 2014-09-30 DIAGNOSIS — R200 Anesthesia of skin: Secondary | ICD-10-CM | POA: Diagnosis not present

## 2014-09-30 DIAGNOSIS — R2689 Other abnormalities of gait and mobility: Secondary | ICD-10-CM | POA: Diagnosis not present

## 2014-10-02 DIAGNOSIS — K5792 Diverticulitis of intestine, part unspecified, without perforation or abscess without bleeding: Secondary | ICD-10-CM | POA: Diagnosis not present

## 2014-10-02 DIAGNOSIS — H8110 Benign paroxysmal vertigo, unspecified ear: Secondary | ICD-10-CM | POA: Diagnosis not present

## 2014-10-02 DIAGNOSIS — R270 Ataxia, unspecified: Secondary | ICD-10-CM | POA: Diagnosis not present

## 2014-10-14 ENCOUNTER — Ambulatory Visit: Payer: Commercial Managed Care - HMO | Admitting: Physical Therapy

## 2014-10-14 DIAGNOSIS — M65341 Trigger finger, right ring finger: Secondary | ICD-10-CM | POA: Diagnosis not present

## 2014-10-14 DIAGNOSIS — M79641 Pain in right hand: Secondary | ICD-10-CM | POA: Diagnosis not present

## 2014-10-19 ENCOUNTER — Ambulatory Visit: Payer: Commercial Managed Care - HMO | Admitting: Physical Therapy

## 2014-10-20 DIAGNOSIS — R42 Dizziness and giddiness: Secondary | ICD-10-CM | POA: Diagnosis not present

## 2014-10-20 DIAGNOSIS — H903 Sensorineural hearing loss, bilateral: Secondary | ICD-10-CM | POA: Diagnosis not present

## 2014-10-21 ENCOUNTER — Ambulatory Visit: Payer: Commercial Managed Care - HMO | Admitting: Physical Therapy

## 2014-10-22 DIAGNOSIS — H8149 Vertigo of central origin, unspecified ear: Secondary | ICD-10-CM | POA: Diagnosis not present

## 2014-10-27 ENCOUNTER — Ambulatory Visit: Payer: Commercial Managed Care - HMO | Admitting: Physical Therapy

## 2014-10-27 DIAGNOSIS — H2513 Age-related nuclear cataract, bilateral: Secondary | ICD-10-CM | POA: Diagnosis not present

## 2014-10-29 ENCOUNTER — Ambulatory Visit: Payer: Commercial Managed Care - HMO | Admitting: Physical Therapy

## 2014-11-02 ENCOUNTER — Ambulatory Visit: Payer: Commercial Managed Care - HMO | Admitting: Physical Therapy

## 2014-11-04 ENCOUNTER — Ambulatory Visit: Payer: Commercial Managed Care - HMO | Admitting: Physical Therapy

## 2014-11-09 ENCOUNTER — Ambulatory Visit: Payer: Commercial Managed Care - HMO | Admitting: Physical Therapy

## 2014-11-10 DIAGNOSIS — Z79899 Other long term (current) drug therapy: Secondary | ICD-10-CM | POA: Diagnosis not present

## 2014-11-10 DIAGNOSIS — M545 Low back pain: Secondary | ICD-10-CM | POA: Diagnosis not present

## 2014-11-10 DIAGNOSIS — E782 Mixed hyperlipidemia: Secondary | ICD-10-CM | POA: Diagnosis not present

## 2014-11-17 ENCOUNTER — Other Ambulatory Visit: Payer: Self-pay | Admitting: Internal Medicine

## 2014-11-17 DIAGNOSIS — Z1231 Encounter for screening mammogram for malignant neoplasm of breast: Secondary | ICD-10-CM

## 2014-11-17 DIAGNOSIS — E782 Mixed hyperlipidemia: Secondary | ICD-10-CM | POA: Diagnosis not present

## 2014-11-17 DIAGNOSIS — Z0000 Encounter for general adult medical examination without abnormal findings: Secondary | ICD-10-CM | POA: Diagnosis not present

## 2014-11-17 DIAGNOSIS — I10 Essential (primary) hypertension: Secondary | ICD-10-CM | POA: Diagnosis not present

## 2014-11-24 ENCOUNTER — Other Ambulatory Visit: Payer: Self-pay | Admitting: Internal Medicine

## 2014-11-24 ENCOUNTER — Ambulatory Visit
Admission: RE | Admit: 2014-11-24 | Discharge: 2014-11-24 | Disposition: A | Discharge status: Home / Self Care | Payer: Commercial Managed Care - HMO | Source: Ambulatory Visit | Attending: Internal Medicine | Admitting: Internal Medicine

## 2014-11-24 DIAGNOSIS — Z1231 Encounter for screening mammogram for malignant neoplasm of breast: Secondary | ICD-10-CM | POA: Insufficient documentation

## 2014-11-24 HISTORY — DX: Malignant (primary) neoplasm, unspecified: C80.1

## 2014-12-31 ENCOUNTER — Ambulatory Visit (INDEPENDENT_AMBULATORY_CARE_PROVIDER_SITE_OTHER): Payer: Commercial Managed Care - HMO | Admitting: Obstetrics and Gynecology

## 2014-12-31 ENCOUNTER — Encounter: Payer: Self-pay | Admitting: Obstetrics and Gynecology

## 2014-12-31 VITALS — BP 123/70 | HR 67 | Ht 63.0 in | Wt 125.3 lb

## 2014-12-31 DIAGNOSIS — N763 Subacute and chronic vulvitis: Secondary | ICD-10-CM

## 2014-12-31 DIAGNOSIS — E785 Hyperlipidemia, unspecified: Secondary | ICD-10-CM | POA: Insufficient documentation

## 2014-12-31 DIAGNOSIS — I341 Nonrheumatic mitral (valve) prolapse: Secondary | ICD-10-CM | POA: Insufficient documentation

## 2014-12-31 DIAGNOSIS — G43909 Migraine, unspecified, not intractable, without status migrainosus: Secondary | ICD-10-CM | POA: Insufficient documentation

## 2014-12-31 DIAGNOSIS — N6019 Diffuse cystic mastopathy of unspecified breast: Secondary | ICD-10-CM | POA: Insufficient documentation

## 2014-12-31 DIAGNOSIS — L259 Unspecified contact dermatitis, unspecified cause: Secondary | ICD-10-CM | POA: Diagnosis not present

## 2014-12-31 DIAGNOSIS — IMO0002 Reserved for concepts with insufficient information to code with codable children: Secondary | ICD-10-CM | POA: Insufficient documentation

## 2014-12-31 DIAGNOSIS — M810 Age-related osteoporosis without current pathological fracture: Secondary | ICD-10-CM | POA: Insufficient documentation

## 2014-12-31 MED ORDER — CLOBETASOL PROPIONATE 0.05 % EX OINT: 1.0000 "application " | TOPICAL_OINTMENT | Freq: Two times a day (BID) | CUTANEOUS | Status: DC

## 2014-12-31 MED ORDER — HYDROXYZINE HCL 25 MG PO TABS: 25.0000 mg | ORAL_TABLET | Freq: Every evening | ORAL | Status: DC | PRN

## 2014-12-31 NOTE — Patient Instructions (Signed)
1.  3 mm punch biopsy of vulvar is completed today. 2.  Keep biopsy site clean and dry.  May do sitz baths. 3.  Use hairdryer to blow dry perineum when wet. 4.  Tylenol or Advil as needed for pain. 5.  Return in 1 week for review of pathology and incision check.Marland Kitchen

## 2014-12-31 NOTE — Progress Notes (Signed)
GYN ENCOUNTER NOTE  Subjective:       Doris Barnes is a 74 y.o. No obstetric history on file. female is here for gynecologic evaluation of the following issues:  1. Chronic vulvitis.    Previous biopsy in  2015 was consistent with chronic inflammation; possible early lichen sclerosis.  Initial treatment was partially effective; symptoms, however, have progressively worsened. She is not experiencing any significant vaginal discharge.  She is havinifficulty sleeping at night because of the itching. Gynecologic History No LMP recorded. Patient has had a hysterectomy. Contraception: status post hysterectomy  Obstetric History OB History  No data available    Past Medical History  Diagnosis Date  . Hypertension   . Mitral valve prolapse   . Cancer ([place]) skin  . Hypothyroid   . IC (interstitial cystitis)   . GERD (gastroesophageal reflux disease)   . Dyspareunia, female   . IBS (irritable bowel syndrome)   . OP (osteoporosis)   . Chronic vulvitis   . Hypercholesteremia   . Vaginal atrophy   . Cystocele   . Rectocele     Past Surgical History  Procedure Laterality Date  . Breast biopsy Left 1998    core bx- neg  . Breast biopsy Left 2007    neg  . Appendectomy    . Vaginal hysterectomy    . Dilation and curettage of uterus    . Tonsillectomy    . Oophorectomy Bilateral     Current Outpatient Prescriptions on File Prior to Visit  Medication Sig Dispense Refill  . aspirin EC 81 MG tablet Take 81 mg by mouth daily.    Marland Kitchen atenolol (TENORMIN) 50 MG tablet Take 50 mg by mouth daily.     Marland Kitchen conjugated estrogens (PREMARIN) vaginal cream Place vaginally.    Marland Kitchen FLUoxetine (PROZAC) 20 MG capsule Take 20 mg by mouth daily.     . hydrochlorothiazide (MICROZIDE) 12.5 MG capsule Take 12.5 mg by mouth daily.     . lansoprazole (PREVACID) 30 MG capsule Take 30 mg by mouth daily at 12 noon.     . loratadine (CLARITIN) 10 MG tablet Take 10 mg by mouth daily.     . meclizine (ANTIVERT)  12.5 MG tablet Take 1 tablet (12.5 mg total) by mouth 3 (three) times daily. 30 tablet 0  . simvastatin (ZOCOR) 20 MG tablet Take 20 mg by mouth daily at 6 PM.      No current facility-administered medications on file prior to visit.    Allergies  Allergen Reactions  . Sulfa Antibiotics Rash    Social History   Social History  . Marital Status: Married    Spouse Name: N/A  . Number of Children: N/A  . Years of Education: N/A   Occupational History  . Not on file.   Social History Main Topics  . Smoking status: Former Research scientist (life sciences)  . Smokeless tobacco: Not on file  . Alcohol Use: No  . Drug Use: No  . Sexual Activity: Yes   Other Topics Concern  . Not on file   Social History Narrative    Family History  Problem Relation Age of Onset  . Diabetes Mother   . Diabetes Father   . Colon cancer Sister   . Diabetes Sister   . Diabetes Maternal Aunt   . Colon cancer Paternal Grandfather   . Breast cancer Neg Hx   . Ovarian cancer Neg Hx     The following portions of the patient's history were reviewed  and updated as appropriate: allergies, current medications, past family history, past medical history, past social history, past surgical history and problem list.  Review of Systems Review of Systems - General ROS: negative for - chills, fatigue, fever, hot flashes, malaise or night sweats Hematological and Lymphatic ROS: negative for - bleeding problems or swollen lymph nodes Gastrointestinal ROS: negative for - abdominal pain, blood in stools, change in bowel habits and nausea/vomiting Musculoskeletal ROS: negative for - joint pain, muscle pain or muscular weakness Genito-Urinary ROS: negative for - change in menstrual cycle, dysmenorrhea, dyspareunia, dysuria, genital discharge, genital ulcers, hematuria, incontinence. POSITIVE-vulvar itching  Objective:   BP 123/70 mmHg  Pulse 67  Ht 5\' 3"  (1.6 m)  Wt 125 lb 4.8 oz (56.836 kg)  BMI 22.20 kg/m2 CONSTITUTIONAL:  Well-developed, well-nourished female in no acute distress.  HENT:  Normocephalic, atraumatic.  rm and dry. No rash noted. Not diaphoretic. No erythema. No pallor. [place]: Alert and oriented to person, place, and time. PSYCHIATRIC: Normal mood and affect. Normal behavior. Normal judgment and thought content. CARDIOVASCULAR:Not Examined RESPIRATORY: Not Examined BREASTS: Not Examined ABDOMEN: Soft, non distended; Non tender.  No Organomegaly. PELVIC:  External Genitalia: Bilateral hyperemia of the labia minora and majora, with possible faint leukoplakia in the Periclitoral region; no epithelial skin breakdown  BUS: Normal  Vagina: Normal  RV: Normal External exam  Bladder: Nontender   PROCEDURE:  Vulvar biopsy.   Vulva was prepped with Betadine.  1% lidocaine without epinephrine was used to locally infiltrate left labia majora with 3 cc of anesthetic.  A 3 mm punch biopsy was performed.  Hemostasis was attained using silver nitrate stick.  Procedure was well tolerated.  Blood loss was minimal.  Specimen is sent to pathology for further evaluation.      Assessment:   1. Chronic vulvitis - Pathology     Plan:   1.  Vulvar biopsy. 2.  Wound precautions given. 3.  Start clobetasol 0.05% twice a day 4 weeks; then daily 4 weeks; then every other day 4 weeks. 4.  Return in 1 week for follow-up on biopsy results and wound check.  Brayton Mars, MD

## 2015-01-05 LAB — PATHOLOGY

## 2015-01-05 LAB — PLEASE NOTE

## 2015-01-07 ENCOUNTER — Ambulatory Visit (INDEPENDENT_AMBULATORY_CARE_PROVIDER_SITE_OTHER): Payer: Commercial Managed Care - HMO | Admitting: Obstetrics and Gynecology

## 2015-01-07 ENCOUNTER — Encounter: Payer: Self-pay | Admitting: Obstetrics and Gynecology

## 2015-01-07 ENCOUNTER — Ambulatory Visit: Payer: Commercial Managed Care - HMO | Admitting: Obstetrics and Gynecology

## 2015-01-07 VITALS — BP 128/74 | HR 71 | Ht 63.0 in | Wt 126.2 lb

## 2015-01-07 DIAGNOSIS — L408 Other psoriasis: Secondary | ICD-10-CM | POA: Diagnosis not present

## 2015-01-07 DIAGNOSIS — N763 Subacute and chronic vulvitis: Secondary | ICD-10-CM | POA: Diagnosis not present

## 2015-01-07 DIAGNOSIS — L308 Other specified dermatitis: Secondary | ICD-10-CM | POA: Insufficient documentation

## 2015-01-07 NOTE — Progress Notes (Signed)
Patient ID: Doris Barnes, female   DOB: 03/15/1941, 74 y.o.   MRN: [id number] Vulvar bx results   No issues with bx  using temovate - bid- itching is better  Chief complaint: 1.  Chronic vulvitis. 2.  Follow up on biopsy.  Patient reports no problems since vulvar biopsy.  She is using the Temovate ointment twice a day now for the past week.  Itching symptoms have diminished.  Pathology: (possible early lichen sclerosis) MILD PSORIASIFORM AND SPONGIOTIC DERMATITIS, CONSISTENT WITH MILD  SUBACUTE ECZEMATOUS DERMATITIS   OBJECTIVE: BP 128/74 mmHg  Pulse 71  Ht 5\' 3"  (1.6 m)  Wt 126 lb 3.2 oz (57.244 kg)  BMI 22.36 kg/m2 Pelvic exam: Vulva-left labia majora biopsy site healing without evidence of infection.  Chronic inflammation bilaterally extending from the clitoral hood to the introitus is noted without any epithelial skin breakdown or ulceration.  Perianal region appears unaffected.  IMPRESSION: 1.  Chronic vulvitis. 2.  Pathology demonstrates MILD PSORIASIFORM AND SPONGIOTIC DERMATITIS, CONSISTENT WITH MILD  SUBACUTE ECZEMATOUS DERMATITIS   PLAN: 1.  Local wound care. 2.  Temovate ointment twice a day for 1 month, followed by daily application for 1 month. 3.  Return in 2 months for follow-up.  A total of 15 minutes were spent face-to-face with the patient during this encounter and over half of that time dealt with counseling and coordination of care.  Brayton Mars, MD

## 2015-01-07 NOTE — Patient Instructions (Signed)
1.  The vulvar biopsy was benign.  The technical term is psoriasiform dermatitis. 2.  Continue with the Temovate ointment twice a day for a total of 4 weeks, followed by once a day application for 4 weeks. 3.  Return in 2 months for follow-up.

## 2015-01-08 DIAGNOSIS — R002 Palpitations: Secondary | ICD-10-CM | POA: Diagnosis not present

## 2015-01-08 DIAGNOSIS — R55 Syncope and collapse: Secondary | ICD-10-CM | POA: Diagnosis not present

## 2015-01-08 DIAGNOSIS — Z23 Encounter for immunization: Secondary | ICD-10-CM | POA: Diagnosis not present

## 2015-01-11 DIAGNOSIS — R55 Syncope and collapse: Secondary | ICD-10-CM | POA: Diagnosis not present

## 2015-01-11 DIAGNOSIS — R002 Palpitations: Secondary | ICD-10-CM | POA: Diagnosis not present

## 2015-01-15 ENCOUNTER — Encounter: Payer: Self-pay | Admitting: *Deleted

## 2015-01-15 DIAGNOSIS — K589 Irritable bowel syndrome without diarrhea: Secondary | ICD-10-CM | POA: Diagnosis not present

## 2015-01-15 DIAGNOSIS — I341 Nonrheumatic mitral (valve) prolapse: Secondary | ICD-10-CM | POA: Diagnosis not present

## 2015-01-15 DIAGNOSIS — R002 Palpitations: Secondary | ICD-10-CM | POA: Diagnosis not present

## 2015-01-15 DIAGNOSIS — E782 Mixed hyperlipidemia: Secondary | ICD-10-CM | POA: Diagnosis not present

## 2015-01-15 DIAGNOSIS — I10 Essential (primary) hypertension: Secondary | ICD-10-CM | POA: Diagnosis not present

## 2015-01-15 DIAGNOSIS — R55 Syncope and collapse: Secondary | ICD-10-CM | POA: Diagnosis not present

## 2015-01-18 ENCOUNTER — Encounter: Payer: Self-pay | Admitting: *Deleted

## 2015-01-18 ENCOUNTER — Encounter
Admission: RE | Disposition: A | Discharge status: Home / Self Care | Payer: Self-pay | Source: Ambulatory Visit | Attending: Cardiology

## 2015-01-18 ENCOUNTER — Ambulatory Visit
Admission: RE | Admit: 2015-01-18 | Discharge: 2015-01-18 | Disposition: A | Discharge status: Home / Self Care | Payer: Commercial Managed Care - HMO | Source: Ambulatory Visit | Attending: Cardiology | Admitting: Cardiology

## 2015-01-18 DIAGNOSIS — Z79899 Other long term (current) drug therapy: Secondary | ICD-10-CM | POA: Diagnosis not present

## 2015-01-18 DIAGNOSIS — I341 Nonrheumatic mitral (valve) prolapse: Secondary | ICD-10-CM | POA: Insufficient documentation

## 2015-01-18 DIAGNOSIS — K219 Gastro-esophageal reflux disease without esophagitis: Secondary | ICD-10-CM | POA: Insufficient documentation

## 2015-01-18 DIAGNOSIS — Z833 Family history of diabetes mellitus: Secondary | ICD-10-CM | POA: Insufficient documentation

## 2015-01-18 DIAGNOSIS — E785 Hyperlipidemia, unspecified: Secondary | ICD-10-CM | POA: Insufficient documentation

## 2015-01-18 DIAGNOSIS — R9439 Abnormal result of other cardiovascular function study: Secondary | ICD-10-CM | POA: Insufficient documentation

## 2015-01-18 DIAGNOSIS — I10 Essential (primary) hypertension: Secondary | ICD-10-CM | POA: Diagnosis not present

## 2015-01-18 DIAGNOSIS — Z87891 Personal history of nicotine dependence: Secondary | ICD-10-CM | POA: Insufficient documentation

## 2015-01-18 DIAGNOSIS — M810 Age-related osteoporosis without current pathological fracture: Secondary | ICD-10-CM | POA: Diagnosis not present

## 2015-01-18 DIAGNOSIS — F419 Anxiety disorder, unspecified: Secondary | ICD-10-CM | POA: Insufficient documentation

## 2015-01-18 DIAGNOSIS — Z7982 Long term (current) use of aspirin: Secondary | ICD-10-CM | POA: Insufficient documentation

## 2015-01-18 DIAGNOSIS — R079 Chest pain, unspecified: Secondary | ICD-10-CM | POA: Diagnosis not present

## 2015-01-18 DIAGNOSIS — I2510 Atherosclerotic heart disease of native coronary artery without angina pectoris: Secondary | ICD-10-CM | POA: Insufficient documentation

## 2015-01-18 DIAGNOSIS — Z882 Allergy status to sulfonamides status: Secondary | ICD-10-CM | POA: Insufficient documentation

## 2015-01-18 DIAGNOSIS — K5790 Diverticulosis of intestine, part unspecified, without perforation or abscess without bleeding: Secondary | ICD-10-CM | POA: Insufficient documentation

## 2015-01-18 DIAGNOSIS — G43909 Migraine, unspecified, not intractable, without status migrainosus: Secondary | ICD-10-CM | POA: Diagnosis not present

## 2015-01-18 DIAGNOSIS — Z825 Family history of asthma and other chronic lower respiratory diseases: Secondary | ICD-10-CM | POA: Insufficient documentation

## 2015-01-18 DIAGNOSIS — D649 Anemia, unspecified: Secondary | ICD-10-CM | POA: Insufficient documentation

## 2015-01-18 DIAGNOSIS — H8110 Benign paroxysmal vertigo, unspecified ear: Secondary | ICD-10-CM | POA: Insufficient documentation

## 2015-01-18 DIAGNOSIS — Z90710 Acquired absence of both cervix and uterus: Secondary | ICD-10-CM | POA: Insufficient documentation

## 2015-01-18 DIAGNOSIS — K589 Irritable bowel syndrome without diarrhea: Secondary | ICD-10-CM | POA: Diagnosis not present

## 2015-01-18 DIAGNOSIS — I25719 Atherosclerosis of autologous vein coronary artery bypass graft(s) with unspecified angina pectoris: Secondary | ICD-10-CM | POA: Diagnosis not present

## 2015-01-18 DIAGNOSIS — N6019 Diffuse cystic mastopathy of unspecified breast: Secondary | ICD-10-CM | POA: Insufficient documentation

## 2015-01-18 DIAGNOSIS — Z800 Family history of malignant neoplasm of digestive organs: Secondary | ICD-10-CM | POA: Insufficient documentation

## 2015-01-18 DIAGNOSIS — Z86010 Personal history of colonic polyps: Secondary | ICD-10-CM | POA: Insufficient documentation

## 2015-01-18 DIAGNOSIS — Z8249 Family history of ischemic heart disease and other diseases of the circulatory system: Secondary | ICD-10-CM | POA: Diagnosis not present

## 2015-01-18 DIAGNOSIS — Z85828 Personal history of other malignant neoplasm of skin: Secondary | ICD-10-CM | POA: Insufficient documentation

## 2015-01-18 DIAGNOSIS — Z801 Family history of malignant neoplasm of trachea, bronchus and lung: Secondary | ICD-10-CM | POA: Diagnosis not present

## 2015-01-18 DIAGNOSIS — K2970 Gastritis, unspecified, without bleeding: Secondary | ICD-10-CM | POA: Insufficient documentation

## 2015-01-18 HISTORY — DX: Gastritis, unspecified, without bleeding: K29.70

## 2015-01-18 HISTORY — DX: Diffuse cystic mastopathy of right breast: N60.11

## 2015-01-18 HISTORY — DX: Migraine, unspecified, not intractable, without status migrainosus: G43.909

## 2015-01-18 HISTORY — DX: Diverticulitis of intestine, part unspecified, without perforation or abscess without bleeding: K57.92

## 2015-01-18 HISTORY — DX: Reserved for concepts with insufficient information to code with codable children: IMO0002

## 2015-01-18 HISTORY — DX: Diffuse cystic mastopathy of left breast: N60.12

## 2015-01-18 HISTORY — DX: Unspecified hemorrhoids: K64.9

## 2015-01-18 HISTORY — DX: Diverticulosis of intestine, part unspecified, without perforation or abscess without bleeding: K57.90

## 2015-01-18 HISTORY — DX: Polyp of colon: K63.5

## 2015-01-18 HISTORY — DX: Anxiety disorder, unspecified: F41.9

## 2015-01-18 HISTORY — DX: Anemia, unspecified: D64.9

## 2015-01-18 HISTORY — PX: CARDIAC CATHETERIZATION: SHX172

## 2015-01-18 SURGERY — LEFT HEART CATH AND CORONARY ANGIOGRAPHY

## 2015-01-18 MED ORDER — SODIUM CHLORIDE 0.9 % IJ SOLN: 3.0000 mL | INTRAMUSCULAR | Status: DC | PRN

## 2015-01-18 MED ORDER — MIDAZOLAM HCL 2 MG/2ML IJ SOLN
INTRAMUSCULAR | Status: AC
  Filled 2015-01-18: qty 2

## 2015-01-18 MED ORDER — FENTANYL CITRATE (PF) 100 MCG/2ML IJ SOLN
INTRAMUSCULAR | Status: AC
  Filled 2015-01-18: qty 2

## 2015-01-18 MED ORDER — SODIUM CHLORIDE 0.9 % IJ SOLN: 3.0000 mL | Freq: Two times a day (BID) | INTRAMUSCULAR | Status: DC

## 2015-01-18 MED ORDER — SODIUM CHLORIDE 0.9 % WEIGHT BASED INFUSION: 3.0000 mL/kg/h | INTRAVENOUS | Status: DC

## 2015-01-18 MED ORDER — SODIUM CHLORIDE 0.9 % IV SOLN
INTRAVENOUS | Status: DC
Start: 2015-01-18 — End: 2015-01-18
  Administered 2015-01-18: 13:00:00 via INTRAVENOUS

## 2015-01-18 MED ORDER — FENTANYL CITRATE (PF) 100 MCG/2ML IJ SOLN
INTRAMUSCULAR | Status: DC | PRN
  Administered 2015-01-18: 50 ug via INTRAVENOUS
  Administered 2015-01-18: 25 ug via INTRAVENOUS

## 2015-01-18 MED ORDER — IOHEXOL 300 MG/ML  SOLN
INTRAMUSCULAR | Status: DC | PRN
Start: 2015-01-18 — End: 2015-01-18
  Administered 2015-01-18: 90 mL via INTRAVENOUS

## 2015-01-18 MED ORDER — MIDAZOLAM HCL 2 MG/2ML IJ SOLN
INTRAMUSCULAR | Status: DC | PRN
Start: 2015-01-18 — End: 2015-01-18
  Administered 2015-01-18: 0.5 mg via INTRAVENOUS
  Administered 2015-01-18: 1 mg via INTRAVENOUS

## 2015-01-18 MED ORDER — SODIUM CHLORIDE 0.9 % IV SOLN: 250.0000 mL | INTRAVENOUS | Status: DC | PRN

## 2015-01-18 MED ORDER — HEPARIN (PORCINE) IN NACL 2-0.9 UNIT/ML-% IJ SOLN
INTRAMUSCULAR | Status: AC
  Filled 2015-01-18: qty 1000

## 2015-01-18 SURGICAL SUPPLY — 11 items
CATH INFINITI 5 FR 3DRC (CATHETERS) ×2 IMPLANT
CATH INFINITI 5FR ANG PIGTAIL (CATHETERS) ×3 IMPLANT
CATH INFINITI 5FR JL4 (CATHETERS) ×3 IMPLANT
CATH INFINITI JR4 5F (CATHETERS) ×3 IMPLANT
DEVICE CLOSURE MYNXGRIP 5F (Vascular Products) ×2 IMPLANT
KIT MANI 3VAL PERCEP (MISCELLANEOUS) ×3 IMPLANT
NDL PERC 18GX7CM (NEEDLE) ×1 IMPLANT
NEEDLE PERC 18GX7CM (NEEDLE) ×3 IMPLANT
PACK CARDIAC CATH (CUSTOM PROCEDURE TRAY) ×3 IMPLANT
SHEATH AVANTI 5FR X 11CM (SHEATH) ×3 IMPLANT
WIRE EMERALD 3MM-J .035X150CM (WIRE) ×3 IMPLANT

## 2015-01-18 NOTE — H&P (Signed)
Chief Complaint: Chief Complaint  Patient presents with  . Establish Care  abnormal stress test ( pt was giving nitro down stairs by dr. Sabra Heck)  Date of Service: 01/15/2015 Date of Birth: [birth year] PCP: Rusty Aus, MD  History of Present Illness: Doris Barnes is a 74 y.o.female patient who is referred by her primary care provider after an abnormal stress test. Patient is had some dizziness and lightheadedness as well as some chest discomfort. The she had a cardiac catheterization approximately 9 years ago showing mild disease. She has noted progressive symptoms recently. She underwent an ETT per her primary care provider today which revealed inferior ischemia. Patient had some fatigue and was given nitroglycerin at the end of the study with resolution of her ST changes. Risk factors include hypertension and family history. She is currently treated with aspirin and atenolol. She is on simvastatin for hyperlipidemia.  Past Medical and Surgical History  Past Medical History Past Medical History  Diagnosis Date  . Anemia  . Anxiety  . Colon polyp  . Diverticulitis  . Diverticulosis  . Fibrocystic breast disease  . Gastritis  . GERD (gastroesophageal reflux disease)  . Hemorrhoids  . Hyperlipidemia  . Hypertension  . Irritable bowel syndrome  . Migraine headache  . Mitral valve prolapse  . Osteoporosis, post-menopausal  . Positional vertigo  . Squamous cell carcinoma   Past Surgical History She has a past surgical history that includes Appendectomy; Colonoscopy (03/12/2012); Hysterectomy Vaginal; Colon surgery; Polypectomy; Upper gastrointestinal endoscopy; egd (01/09/2012); Hysterectomy; and Oophorectomy.   Medications and Allergies  Current Medications  Current Outpatient Prescriptions  Medication Sig Dispense Refill  . aspirin 81 MG EC tablet Take 81 mg by mouth once daily.  Marland Kitchen atenolol (TENORMIN) 50 MG tablet Take 1 tablet (50 mg total) by mouth once daily. 90 tablet 1  .  CALCIUM CITRATE-VITAMIN D2 ORAL Take by mouth 2 (two) times daily.  Marland Kitchen CLARITIN 10 mg tablet Take 10 mg by mouth once daily.  . Compound Medication Place 5 mg vaginally nightly as needed (for pelvic pain or pelvic muscle spasm.). Med Name: Diazepam 5mg  vaginal suppository. 30 each 3  . conjugated estrogens (PREMARIN) 0.625 mg/gram vaginal cream Place 0.5 g vaginally twice a week. 42.5 g 12  . FLUoxetine (PROZAC) 20 MG capsule Take 1 capsule (20 mg total) by mouth once daily. 90 capsule 1  . fluticasone (FLONASE) 50 mcg/actuation nasal spray Place 2 sprays into both nostrils once daily.  . hydrochlorothiazide (MICROZIDE) 12.5 mg capsule Take 1 capsule (12.5 mg total) by mouth once daily. 90 capsule 1  . lansoprazole (PREVACID) 30 MG DR capsule Take 1 capsule (30 mg total) by mouth once daily. 30 capsule 11  . simvastatin (ZOCOR) 20 MG tablet Take 1 tablet (20 mg total) by mouth nightly. 90 tablet 1   No current facility-administered medications for this visit.   Allergies: Sulfa (sulfonamide antibiotics)  Social and Family History  Social History reports that she quit smoking about 27 years ago. Her smoking use included Cigarettes. She has never used smokeless tobacco. She reports that she does not drink alcohol or use illicit drugs.  Family History Family History  Problem Relation Age of Onset  . Emphysema Mother  . Lung disease Mother  . Diabetes type II Mother  . Heart failure Mother  . Emphysema Father  . Heart attack Father  . Diabetes type II Father  . COPD Father  . Colon cancer Sister  . COPD Brother  . Lung  cancer Brother  . Colon cancer Brother  . No Known Problems Sister  . Heart disease Sister  . Stomach cancer Maternal Aunt  . Colon cancer Paternal Grandfather   Review of Systems  Review of Systems  Constitutional: Negative for chills, diaphoresis, fever, malaise/fatigue and weight loss.  HENT: Negative for congestion, ear discharge, hearing loss and tinnitus.   Eyes: Negative for blurred vision.  Respiratory: Negative for cough, hemoptysis, sputum production, shortness of breath and wheezing.  Cardiovascular: Positive for chest pain. Negative for palpitations, orthopnea, claudication, leg swelling and PND.  Gastrointestinal: Negative for abdominal pain, blood in stool, constipation, diarrhea, heartburn, melena, nausea and vomiting.  Genitourinary: Negative for dysuria, frequency, hematuria and urgency.  Musculoskeletal: Negative for back pain, falls, joint pain and myalgias.  Skin: Negative for itching and rash.  Neurological: Negative for dizziness, tingling, focal weakness, loss of consciousness, weakness and headaches.  Endo/Heme/Allergies: Negative for polydipsia. Does not bruise/bleed easily.  Psychiatric/Behavioral: Negative for depression, memory loss and substance abuse. The patient is not nervous/anxious.   Physical Examination   Vitals: Visit Vitals  . BP 130/82 (BP Location: Left upper arm, Patient Position: Sitting, BP Cuff Size: Adult)  . Pulse 76  . Ht 160 cm (5\' 3" )  . Wt 56.2 kg (124 lb)  . BMI 21.97 kg/m2   Ht:160 cm (5\' 3" ) Wt:56.2 kg (124 lb) FAO:ZHYQ surface area is 1.58 meters squared. Body mass index is 21.97 kg/(m^2).  Wt Readings from Last 3 Encounters:  01/15/15 56.2 kg (124 lb)  01/08/15 56.2 kg (124 lb)  11/17/14 56.2 kg (124 lb)   BP Readings from Last 3 Encounters:  01/15/15 130/82  01/08/15 134/70  11/17/14 132/68   General appearance appears in no acute distress  Head Mouth and Eye exam Normocephalic, without obvious abnormality, atraumatic Dentition is good Eyes appear anicteric   Neck exam Thyroid: normal  Nodes: no obvious adenopathy  LUNGS Breath Sounds: Normal Percussion: Normal  CARDIOVASCULAR JVP CV wave: no HJR: no Elevation at 90 degrees: None Carotid Pulse: normal pulsation bilaterally Bruit: None Apex: apical impulse normal  Auscultation Rhythm: normal sinus  rhythm S1: normal S2: normal Clicks: no Rub: no Murmurs: no murmurs  Gallop: None ABDOMEN Liver enlargement: no Pulsatile aorta: no Ascites: no Bruits: no  EXTREMITIES Clubbing: no Edema: trace to 1+ bilateral pedal edema Pulses: peripheral pulses symmetrical Femoral Bruits: no Amputation: no SKIN Rash: no Cyanosis: no Embolic phemonenon: no Bruising: no NEURO Alert and Oriented to person, place and time: yes Non focal: yes  PSYCH: Pt appears to have normal affect  LABS REVIEWED Last 3 CBC results: Lab Results  Component Value Date  WBC 6.0 11/10/2014  WBC 6.4 05/05/2014  WBC 7.1 10/16/2013   Lab Results  Component Value Date  HGB 14.3 11/10/2014  HGB 14.0 05/05/2014  HGB 14.0 11/10/2013   Lab Results  Component Value Date  HCT 43.5 11/10/2014  HCT 40.6 05/05/2014  HCT 42.1 10/16/2013   Lab Results  Component Value Date  PLT 223 11/10/2014  PLT 226 05/05/2014  PLT 230 10/16/2013   Lab Results  Component Value Date  CREATININE 0.8 11/10/2014  BUN 12 11/10/2014  NA 141 11/10/2014  K 4.5 11/10/2014  CL 101 11/10/2014  CO2 33.8 (H) 11/10/2014   No results found for: HGBA1C  Lab Results  Component Value Date  HDL 52.6 11/10/2014  HDL 43.0 05/05/2014  HDL 40.8 11/10/2013   Lab Results  Component Value Date  LDLCALC 115 11/10/2014  LDLCALC 76  05/05/2014  LDLCALC 107 11/10/2013   Lab Results  Component Value Date  TRIG 183 11/10/2014  TRIG 272 (H) 05/05/2014  TRIG 172 11/10/2013   Lab Results  Component Value Date  ALT 16 11/10/2014  AST 21 11/10/2014  ALKPHOS 68 11/10/2014   Lab Results  Component Value Date  TSH 1.515 11/10/2014   Diagnostic Studies Reviewed:  EKG EKG demonstrated normal sinus rhythm, nonspecific ST and T waves changes.  Assessment and Plan   74 y.o. female with  ICD-10-CM ICD-9-CM  1. Mitral valve prolapse-currently stable with no significant murmur. I34.1 424.0  2. Essential hypertension-continue  with current regimen for blood pressure control including dash diet and atenolol I10 978.4 Basic Metabolic Panel (BMP)  CBC w/auto Differential (5 Part)  Prothrombin Time (INR)  3. Mixed hyperlipidemia-continue with simvastatin with low-fat diet E78.2 272.2  4. Irritable bowel syndrome without diarrhea K58.9 564.1   5. Chest pain-abnormal exercise treadmill test. Will proceed with cardiac catheterization in a patient with multiple risk factors for heart disease, exertional chest pain and 2-3 mm of ST depression the inferior leads with stress. Further recommendations after catheterization  Return in about 1 week (around 01/22/2015).  These notes generated with voice recognition software. I apologize for typographical errors.  Sydnee Levans, MD

## 2015-01-18 NOTE — Discharge Instructions (Signed)
The drugs you were given will stay in your system until tomorrow, so for the next 24 hours you should not.  Drive an automobile. Make any legal decisions.  Drink any alcoholic beverages.  Today you should start with liquids and gradually work up to solid foods as your are able to tolerate them  Resume your regular medications as prescribed by your doctor.  Avoid aspirin for 24 hours.    Change the Band-Aid or dressing as needed.  After one day no dressing is needed.  Avoid strenuous activity for the remainder of the day.  Please notify your primary physician immediately if you have any unusual bleeding, trouble breathing, fever >100 degrees or pain not relieved by the medication your doctor prescribed for your doctor prescribed for you physician

## 2015-01-25 DIAGNOSIS — E782 Mixed hyperlipidemia: Secondary | ICD-10-CM | POA: Diagnosis not present

## 2015-01-25 DIAGNOSIS — I10 Essential (primary) hypertension: Secondary | ICD-10-CM | POA: Diagnosis not present

## 2015-01-25 DIAGNOSIS — I341 Nonrheumatic mitral (valve) prolapse: Secondary | ICD-10-CM | POA: Diagnosis not present

## 2015-01-25 DIAGNOSIS — I25119 Atherosclerotic heart disease of native coronary artery with unspecified angina pectoris: Secondary | ICD-10-CM | POA: Diagnosis not present

## 2015-01-27 DIAGNOSIS — R002 Palpitations: Secondary | ICD-10-CM | POA: Diagnosis not present

## 2015-01-27 DIAGNOSIS — R5382 Chronic fatigue, unspecified: Secondary | ICD-10-CM | POA: Diagnosis not present

## 2015-02-26 DIAGNOSIS — R42 Dizziness and giddiness: Secondary | ICD-10-CM | POA: Diagnosis not present

## 2015-02-26 DIAGNOSIS — I10 Essential (primary) hypertension: Secondary | ICD-10-CM | POA: Diagnosis not present

## 2015-03-07 DIAGNOSIS — K5289 Other specified noninfective gastroenteritis and colitis: Secondary | ICD-10-CM | POA: Diagnosis not present

## 2015-03-07 DIAGNOSIS — R109 Unspecified abdominal pain: Secondary | ICD-10-CM | POA: Diagnosis not present

## 2015-03-07 DIAGNOSIS — Z882 Allergy status to sulfonamides status: Secondary | ICD-10-CM | POA: Diagnosis not present

## 2015-03-07 DIAGNOSIS — I341 Nonrheumatic mitral (valve) prolapse: Secondary | ICD-10-CM | POA: Diagnosis not present

## 2015-03-07 DIAGNOSIS — I10 Essential (primary) hypertension: Secondary | ICD-10-CM | POA: Diagnosis not present

## 2015-03-07 DIAGNOSIS — Z888 Allergy status to other drugs, medicaments and biological substances status: Secondary | ICD-10-CM | POA: Diagnosis not present

## 2015-03-07 DIAGNOSIS — K119 Disease of salivary gland, unspecified: Secondary | ICD-10-CM | POA: Diagnosis not present

## 2015-03-07 DIAGNOSIS — E785 Hyperlipidemia, unspecified: Secondary | ICD-10-CM | POA: Diagnosis not present

## 2015-03-07 DIAGNOSIS — K921 Melena: Secondary | ICD-10-CM | POA: Diagnosis not present

## 2015-03-07 DIAGNOSIS — K625 Hemorrhage of anus and rectum: Secondary | ICD-10-CM | POA: Diagnosis not present

## 2015-03-07 DIAGNOSIS — K529 Noninfective gastroenteritis and colitis, unspecified: Secondary | ICD-10-CM | POA: Diagnosis not present

## 2015-03-07 DIAGNOSIS — D72829 Elevated white blood cell count, unspecified: Secondary | ICD-10-CM | POA: Diagnosis not present

## 2015-03-07 DIAGNOSIS — Z79899 Other long term (current) drug therapy: Secondary | ICD-10-CM | POA: Diagnosis not present

## 2015-03-07 DIAGNOSIS — R1032 Left lower quadrant pain: Secondary | ICD-10-CM | POA: Diagnosis not present

## 2015-03-07 DIAGNOSIS — K5731 Diverticulosis of large intestine without perforation or abscess with bleeding: Secondary | ICD-10-CM | POA: Diagnosis not present

## 2015-03-07 DIAGNOSIS — K5730 Diverticulosis of large intestine without perforation or abscess without bleeding: Secondary | ICD-10-CM | POA: Diagnosis not present

## 2015-03-07 DIAGNOSIS — K111 Hypertrophy of salivary gland: Secondary | ICD-10-CM | POA: Diagnosis not present

## 2015-03-07 DIAGNOSIS — K5909 Other constipation: Secondary | ICD-10-CM | POA: Diagnosis not present

## 2015-03-09 ENCOUNTER — Ambulatory Visit: Payer: Commercial Managed Care - HMO | Admitting: Obstetrics and Gynecology

## 2015-03-15 DIAGNOSIS — I10 Essential (primary) hypertension: Secondary | ICD-10-CM | POA: Diagnosis not present

## 2015-03-15 DIAGNOSIS — K5792 Diverticulitis of intestine, part unspecified, without perforation or abscess without bleeding: Secondary | ICD-10-CM | POA: Diagnosis not present

## 2015-03-23 DIAGNOSIS — N763 Subacute and chronic vulvitis: Secondary | ICD-10-CM | POA: Diagnosis not present

## 2015-03-23 DIAGNOSIS — N816 Rectocele: Secondary | ICD-10-CM | POA: Diagnosis not present

## 2015-04-07 DIAGNOSIS — I25119 Atherosclerotic heart disease of native coronary artery with unspecified angina pectoris: Secondary | ICD-10-CM | POA: Diagnosis not present

## 2015-04-07 DIAGNOSIS — J40 Bronchitis, not specified as acute or chronic: Secondary | ICD-10-CM | POA: Diagnosis not present

## 2015-04-07 DIAGNOSIS — J029 Acute pharyngitis, unspecified: Secondary | ICD-10-CM | POA: Diagnosis not present

## 2015-04-28 DIAGNOSIS — M65341 Trigger finger, right ring finger: Secondary | ICD-10-CM | POA: Diagnosis not present

## 2015-04-30 DIAGNOSIS — N763 Subacute and chronic vulvitis: Secondary | ICD-10-CM | POA: Diagnosis not present

## 2015-05-14 DIAGNOSIS — Z0000 Encounter for general adult medical examination without abnormal findings: Secondary | ICD-10-CM | POA: Diagnosis not present

## 2015-05-14 DIAGNOSIS — E782 Mixed hyperlipidemia: Secondary | ICD-10-CM | POA: Diagnosis not present

## 2015-05-21 DIAGNOSIS — E782 Mixed hyperlipidemia: Secondary | ICD-10-CM | POA: Diagnosis not present

## 2015-05-21 DIAGNOSIS — M810 Age-related osteoporosis without current pathological fracture: Secondary | ICD-10-CM | POA: Diagnosis not present

## 2015-05-21 DIAGNOSIS — I10 Essential (primary) hypertension: Secondary | ICD-10-CM | POA: Diagnosis not present

## 2015-06-25 DIAGNOSIS — D692 Other nonthrombocytopenic purpura: Secondary | ICD-10-CM | POA: Diagnosis not present

## 2015-06-25 DIAGNOSIS — B359 Dermatophytosis, unspecified: Secondary | ICD-10-CM | POA: Diagnosis not present

## 2015-06-25 DIAGNOSIS — L820 Inflamed seborrheic keratosis: Secondary | ICD-10-CM | POA: Diagnosis not present

## 2015-06-25 DIAGNOSIS — L821 Other seborrheic keratosis: Secondary | ICD-10-CM | POA: Diagnosis not present

## 2015-08-31 DIAGNOSIS — B353 Tinea pedis: Secondary | ICD-10-CM | POA: Diagnosis not present

## 2015-08-31 DIAGNOSIS — L820 Inflamed seborrheic keratosis: Secondary | ICD-10-CM | POA: Diagnosis not present

## 2015-10-01 ENCOUNTER — Other Ambulatory Visit: Payer: Self-pay | Admitting: Internal Medicine

## 2015-10-01 DIAGNOSIS — Z1231 Encounter for screening mammogram for malignant neoplasm of breast: Secondary | ICD-10-CM

## 2015-11-12 DIAGNOSIS — I10 Essential (primary) hypertension: Secondary | ICD-10-CM | POA: Diagnosis not present

## 2015-11-12 DIAGNOSIS — E782 Mixed hyperlipidemia: Secondary | ICD-10-CM | POA: Diagnosis not present

## 2015-11-12 DIAGNOSIS — M810 Age-related osteoporosis without current pathological fracture: Secondary | ICD-10-CM | POA: Diagnosis not present

## 2015-11-19 DIAGNOSIS — K625 Hemorrhage of anus and rectum: Secondary | ICD-10-CM | POA: Diagnosis not present

## 2015-11-19 DIAGNOSIS — Z0000 Encounter for general adult medical examination without abnormal findings: Secondary | ICD-10-CM | POA: Diagnosis not present

## 2015-11-19 DIAGNOSIS — M810 Age-related osteoporosis without current pathological fracture: Secondary | ICD-10-CM | POA: Diagnosis not present

## 2015-11-19 DIAGNOSIS — G5702 Lesion of sciatic nerve, left lower limb: Secondary | ICD-10-CM | POA: Diagnosis not present

## 2015-11-25 DIAGNOSIS — J4522 Mild intermittent asthma with status asthmaticus: Secondary | ICD-10-CM | POA: Diagnosis not present

## 2015-11-26 ENCOUNTER — Other Ambulatory Visit: Payer: Self-pay | Admitting: Internal Medicine

## 2015-11-26 ENCOUNTER — Ambulatory Visit
Admission: RE | Admit: 2015-11-26 | Discharge: 2015-11-26 | Disposition: A | Discharge status: Home / Self Care | Payer: Commercial Managed Care - HMO | Source: Ambulatory Visit | Attending: Internal Medicine | Admitting: Internal Medicine

## 2015-11-26 DIAGNOSIS — Z1231 Encounter for screening mammogram for malignant neoplasm of breast: Secondary | ICD-10-CM | POA: Diagnosis not present

## 2015-12-03 DIAGNOSIS — H353131 Nonexudative age-related macular degeneration, bilateral, early dry stage: Secondary | ICD-10-CM | POA: Diagnosis not present

## 2015-12-03 DIAGNOSIS — H2513 Age-related nuclear cataract, bilateral: Secondary | ICD-10-CM | POA: Diagnosis not present

## 2015-12-06 DIAGNOSIS — Z86010 Personal history of colonic polyps: Secondary | ICD-10-CM | POA: Diagnosis not present

## 2015-12-06 DIAGNOSIS — Z8719 Personal history of other diseases of the digestive system: Secondary | ICD-10-CM | POA: Diagnosis not present

## 2015-12-07 DIAGNOSIS — M810 Age-related osteoporosis without current pathological fracture: Secondary | ICD-10-CM | POA: Diagnosis not present

## 2015-12-07 DIAGNOSIS — I10 Essential (primary) hypertension: Secondary | ICD-10-CM | POA: Diagnosis not present

## 2015-12-07 DIAGNOSIS — R002 Palpitations: Secondary | ICD-10-CM | POA: Diagnosis not present

## 2015-12-09 DIAGNOSIS — H2513 Age-related nuclear cataract, bilateral: Secondary | ICD-10-CM | POA: Diagnosis not present

## 2015-12-13 ENCOUNTER — Encounter: Payer: Self-pay | Admitting: *Deleted

## 2015-12-14 DIAGNOSIS — M810 Age-related osteoporosis without current pathological fracture: Secondary | ICD-10-CM | POA: Diagnosis not present

## 2015-12-14 DIAGNOSIS — I10 Essential (primary) hypertension: Secondary | ICD-10-CM | POA: Diagnosis not present

## 2015-12-14 DIAGNOSIS — Z23 Encounter for immunization: Secondary | ICD-10-CM | POA: Diagnosis not present

## 2015-12-14 NOTE — H&P (Signed)
See scanned note.

## 2015-12-15 ENCOUNTER — Ambulatory Visit
Admission: RE | Admit: 2015-12-15 | Discharge: 2015-12-15 | Disposition: A | Discharge status: Home / Self Care | Payer: Commercial Managed Care - HMO | Source: Ambulatory Visit | Attending: Ophthalmology | Admitting: Ophthalmology

## 2015-12-15 ENCOUNTER — Ambulatory Visit: Payer: Commercial Managed Care - HMO | Admitting: Anesthesiology

## 2015-12-15 ENCOUNTER — Encounter
Admission: RE | Disposition: A | Discharge status: Home / Self Care | Payer: Self-pay | Source: Ambulatory Visit | Attending: Ophthalmology

## 2015-12-15 ENCOUNTER — Encounter: Payer: Self-pay | Admitting: *Deleted

## 2015-12-15 DIAGNOSIS — F419 Anxiety disorder, unspecified: Secondary | ICD-10-CM | POA: Insufficient documentation

## 2015-12-15 DIAGNOSIS — K219 Gastro-esophageal reflux disease without esophagitis: Secondary | ICD-10-CM | POA: Insufficient documentation

## 2015-12-15 DIAGNOSIS — I341 Nonrheumatic mitral (valve) prolapse: Secondary | ICD-10-CM | POA: Insufficient documentation

## 2015-12-15 DIAGNOSIS — E7800 Pure hypercholesterolemia, unspecified: Secondary | ICD-10-CM | POA: Insufficient documentation

## 2015-12-15 DIAGNOSIS — Z90710 Acquired absence of both cervix and uterus: Secondary | ICD-10-CM | POA: Insufficient documentation

## 2015-12-15 DIAGNOSIS — Z8673 Personal history of transient ischemic attack (TIA), and cerebral infarction without residual deficits: Secondary | ICD-10-CM | POA: Insufficient documentation

## 2015-12-15 DIAGNOSIS — K449 Diaphragmatic hernia without obstruction or gangrene: Secondary | ICD-10-CM | POA: Diagnosis not present

## 2015-12-15 DIAGNOSIS — Z7982 Long term (current) use of aspirin: Secondary | ICD-10-CM | POA: Insufficient documentation

## 2015-12-15 DIAGNOSIS — H2511 Age-related nuclear cataract, right eye: Secondary | ICD-10-CM | POA: Insufficient documentation

## 2015-12-15 DIAGNOSIS — E039 Hypothyroidism, unspecified: Secondary | ICD-10-CM | POA: Diagnosis not present

## 2015-12-15 DIAGNOSIS — Z87891 Personal history of nicotine dependence: Secondary | ICD-10-CM | POA: Insufficient documentation

## 2015-12-15 DIAGNOSIS — I10 Essential (primary) hypertension: Secondary | ICD-10-CM | POA: Insufficient documentation

## 2015-12-15 DIAGNOSIS — Z955 Presence of coronary angioplasty implant and graft: Secondary | ICD-10-CM | POA: Diagnosis not present

## 2015-12-15 DIAGNOSIS — Z85828 Personal history of other malignant neoplasm of skin: Secondary | ICD-10-CM | POA: Diagnosis not present

## 2015-12-15 DIAGNOSIS — D649 Anemia, unspecified: Secondary | ICD-10-CM | POA: Insufficient documentation

## 2015-12-15 DIAGNOSIS — J45909 Unspecified asthma, uncomplicated: Secondary | ICD-10-CM | POA: Insufficient documentation

## 2015-12-15 DIAGNOSIS — H2513 Age-related nuclear cataract, bilateral: Secondary | ICD-10-CM | POA: Diagnosis not present

## 2015-12-15 HISTORY — DX: Personal history of other diseases of the digestive system: Z87.19

## 2015-12-15 HISTORY — PX: CATARACT EXTRACTION W/PHACO: SHX586

## 2015-12-15 SURGERY — PHACOEMULSIFICATION, CATARACT, WITH IOL INSERTION
Anesthesia: Monitor Anesthesia Care | Site: Eye | Laterality: Right | Wound class: Clean

## 2015-12-15 MED ORDER — NA CHONDROIT SULF-NA HYALURON 40-17 MG/ML IO SOLN
INTRAOCULAR | Status: AC
  Filled 2015-12-15: qty 1

## 2015-12-15 MED ORDER — MIDAZOLAM HCL 2 MG/2ML IJ SOLN
INTRAMUSCULAR | Status: DC | PRN
  Administered 2015-12-15: 1 mg via INTRAVENOUS

## 2015-12-15 MED ORDER — SODIUM CHLORIDE 0.9 % IV SOLN
INTRAVENOUS | Status: DC
  Administered 2015-12-15 (×2): via INTRAVENOUS

## 2015-12-15 MED ORDER — ONDANSETRON HCL 4 MG/2ML IJ SOLN: 4.0000 mg | Freq: Once | INTRAMUSCULAR | Status: DC | PRN

## 2015-12-15 MED ORDER — ALFENTANIL 500 MCG/ML IJ INJ
INJECTION | INTRAMUSCULAR | Status: DC | PRN
  Administered 2015-12-15: 500 ug via INTRAVENOUS

## 2015-12-15 MED ORDER — ONDANSETRON HCL 4 MG/2ML IJ SOLN: 4.0000 mg | Freq: Once | INTRAMUSCULAR | 0 refills | Status: DC | PRN

## 2015-12-15 MED ORDER — HYALURONIDASE HUMAN 150 UNIT/ML IJ SOLN
INTRAMUSCULAR | Status: AC
  Filled 2015-12-15: qty 1

## 2015-12-15 MED ORDER — FENTANYL CITRATE (PF) 100 MCG/2ML IJ SOLN: 25.0000 ug | INTRAMUSCULAR | Status: DC | PRN

## 2015-12-15 MED ORDER — LIDOCAINE HCL (PF) 4 % IJ SOLN
INTRAMUSCULAR | Status: DC | PRN
  Administered 2015-12-15: 4 mL via OPHTHALMIC

## 2015-12-15 MED ORDER — PHENYLEPHRINE HCL 10 % OP SOLN
1.0000 [drp] | OPHTHALMIC | Status: AC
  Administered 2015-12-15 (×3): 1 [drp] via OPHTHALMIC

## 2015-12-15 MED ORDER — PHENYLEPHRINE HCL 10 % OP SOLN: 1.0000 [drp] | OPHTHALMIC | 0 refills | Status: DC

## 2015-12-15 MED ORDER — EPINEPHRINE HCL 1 MG/ML IJ SOLN
INTRAOCULAR | Status: DC | PRN
  Administered 2015-12-15: 1 mL via OPHTHALMIC

## 2015-12-15 MED ORDER — LIDOCAINE HCL (PF) 4 % IJ SOLN
INTRAOCULAR | Status: DC | PRN
  Administered 2015-12-15: .5 mL via OPHTHALMIC

## 2015-12-15 MED ORDER — MOXIFLOXACIN HCL 0.5 % OP SOLN
OPHTHALMIC | Status: DC | PRN
Start: 2015-12-15 — End: 2015-12-15
  Administered 2015-12-15: 1 [drp] via OPHTHALMIC

## 2015-12-15 MED ORDER — POVIDONE-IODINE 5 % OP SOLN
OPHTHALMIC | Status: DC | PRN
  Administered 2015-12-15: 1 via OPHTHALMIC

## 2015-12-15 MED ORDER — CYCLOPENTOLATE HCL 2 % OP SOLN
1.0000 [drp] | OPHTHALMIC | Status: AC
  Administered 2015-12-15 (×3): 1 [drp] via OPHTHALMIC

## 2015-12-15 MED ORDER — CEFUROXIME OPHTHALMIC INJECTION 1 MG/0.1 ML
INJECTION | OPHTHALMIC | Status: AC
  Filled 2015-12-15: qty 0.1

## 2015-12-15 MED ORDER — TETRACAINE HCL 0.5 % OP SOLN
OPHTHALMIC | Status: AC
  Filled 2015-12-15: qty 2

## 2015-12-15 MED ORDER — EPINEPHRINE HCL 1 MG/ML IJ SOLN
INTRAMUSCULAR | Status: AC
Start: 2015-12-15 — End: 2015-12-15
  Filled 2015-12-15: qty 2

## 2015-12-15 MED ORDER — TETRACAINE HCL 0.5 % OP SOLN
OPHTHALMIC | Status: DC | PRN
  Administered 2015-12-15: 1 [drp] via OPHTHALMIC

## 2015-12-15 MED ORDER — MOXIFLOXACIN HCL 0.5 % OP SOLN
1.0000 [drp] | OPHTHALMIC | Status: AC
  Administered 2015-12-15 (×3): 1 [drp] via OPHTHALMIC

## 2015-12-15 MED ORDER — NA CHONDROIT SULF-NA HYALURON 40-17 MG/ML IO SOLN
INTRAOCULAR | Status: DC | PRN
  Administered 2015-12-15: 1 mL via INTRAOCULAR

## 2015-12-15 MED ORDER — MOXIFLOXACIN HCL 0.5 % OP SOLN
OPHTHALMIC | Status: AC
  Administered 2015-12-15: 1 [drp] via OPHTHALMIC
  Filled 2015-12-15: qty 3

## 2015-12-15 MED ORDER — CARBACHOL 0.01 % IO SOLN
INTRAOCULAR | Status: DC | PRN
  Administered 2015-12-15: .5 mL via INTRAOCULAR

## 2015-12-15 MED ORDER — CYCLOPENTOLATE HCL 2 % OP SOLN
OPHTHALMIC | Status: AC
  Administered 2015-12-15: 1 [drp] via OPHTHALMIC
  Filled 2015-12-15: qty 2

## 2015-12-15 MED ORDER — LIDOCAINE HCL (PF) 4 % IJ SOLN
INTRAMUSCULAR | Status: AC
  Filled 2015-12-15: qty 5

## 2015-12-15 MED ORDER — BUPIVACAINE HCL (PF) 0.75 % IJ SOLN
INTRAMUSCULAR | Status: AC
  Filled 2015-12-15: qty 10

## 2015-12-15 MED ORDER — POVIDONE-IODINE 5 % OP SOLN
OPHTHALMIC | Status: AC
  Filled 2015-12-15: qty 30

## 2015-12-15 MED ORDER — PHENYLEPHRINE HCL 10 % OP SOLN
OPHTHALMIC | Status: AC
  Administered 2015-12-15: 1 [drp] via OPHTHALMIC
  Filled 2015-12-15: qty 5

## 2015-12-15 MED ORDER — CEFUROXIME OPHTHALMIC INJECTION 1 MG/0.1 ML
INJECTION | OPHTHALMIC | Status: DC | PRN
  Administered 2015-12-15: .1 mL via INTRACAMERAL

## 2015-12-15 SURGICAL SUPPLY — 30 items
CANNULA ANT/CHMB 27G (MISCELLANEOUS) ×1 IMPLANT
CANNULA ANT/CHMB 27GA (MISCELLANEOUS) ×3 IMPLANT
CORD BIP STRL DISP 12FT (MISCELLANEOUS) ×3 IMPLANT
CUP MEDICINE 2OZ PLAST GRAD ST (MISCELLANEOUS) ×3 IMPLANT
DRAPE XRAY CASSETTE 23X24 (DRAPES) ×3 IMPLANT
ERASER HMR WETFIELD 18G (MISCELLANEOUS) ×3 IMPLANT
GLOVE BIO SURGEON STRL SZ8 (GLOVE) ×3 IMPLANT
GLOVE SURG LX 6.5 MICRO (GLOVE) ×2
GLOVE SURG LX 8.0 MICRO (GLOVE) ×2
GLOVE SURG LX STRL 6.5 MICRO (GLOVE) ×1 IMPLANT
GLOVE SURG LX STRL 8.0 MICRO (GLOVE) ×1 IMPLANT
GOWN STRL REUS W/ TWL LRG LVL3 (GOWN DISPOSABLE) ×1 IMPLANT
GOWN STRL REUS W/ TWL XL LVL3 (GOWN DISPOSABLE) ×1 IMPLANT
GOWN STRL REUS W/TWL LRG LVL3 (GOWN DISPOSABLE) ×3
GOWN STRL REUS W/TWL XL LVL3 (GOWN DISPOSABLE) ×3
LENS IOL ACRYSOF IQ 21.0 (Intraocular Lens) ×2 IMPLANT
PACK CATARACT (MISCELLANEOUS) ×3 IMPLANT
PACK CATARACT DINGLEDEIN LX (MISCELLANEOUS) ×3 IMPLANT
PACK EYE AFTER SURG (MISCELLANEOUS) ×3 IMPLANT
SHLD EYE VISITEC  UNIV (MISCELLANEOUS) ×3 IMPLANT
SOL BSS BAG (MISCELLANEOUS) ×3
SOL PREP PVP 2OZ (MISCELLANEOUS) ×3
SOLUTION BSS BAG (MISCELLANEOUS) ×1 IMPLANT
SOLUTION PREP PVP 2OZ (MISCELLANEOUS) ×1 IMPLANT
SUT SILK 5-0 (SUTURE) ×3 IMPLANT
SYR 3ML LL SCALE MARK (SYRINGE) ×3 IMPLANT
SYR 5ML LL (SYRINGE) ×3 IMPLANT
SYR TB 1ML 27GX1/2 LL (SYRINGE) ×3 IMPLANT
WATER STERILE IRR 250ML POUR (IV SOLUTION) ×3 IMPLANT
WIPE NON LINTING 3.25X3.25 (MISCELLANEOUS) ×3 IMPLANT

## 2015-12-15 NOTE — Discharge Instructions (Signed)
Eye Surgery Discharge Instructions  Expect mild scratchy sensation or mild soreness. DO NOT RUB YOUR EYE!  The day of surgery:  Minimal physical activity, but bed rest is not required  No reading, computer work, or close hand work  No bending, lifting, or straining.  May watch TV  For 24 hours:  No driving, legal decisions, or alcoholic beverages  Safety precautions  Eat anything you prefer: It is better to start with liquids, then soup then solid foods.  _____ Eye patch should be worn until postoperative exam tomorrow.  ____ Solar shield eyeglasses should be worn for comfort in the sunlight/patch while sleeping  Resume all regular medications including aspirin or Coumadin if these were discontinued prior to surgery. You may shower, bathe, shave, or wash your hair. Tylenol may be taken for mild discomfort.  Call your doctor if you experience significant pain, nausea, or vomiting, fever > 101 or other signs of infection. [phone] or [phone] Specific instructions:  Follow-up Information    Orene Abbasi, MD .   Specialty:  Ophthalmology Why:  September 21 at 9:40am Contact information: [address]   [place] [address] [phone]

## 2015-12-15 NOTE — Anesthesia Postprocedure Evaluation (Signed)
Anesthesia Post Note  Patient: Doris Barnes  Procedure(s) Performed: Procedure(s) (LRB): CATARACT EXTRACTION PHACO AND INTRAOCULAR LENS PLACEMENT (IOC) (Right)  Patient location during evaluation: PACU Anesthesia Type: MAC Level of consciousness: awake Pain management: satisfactory to patient Vital Signs Assessment: post-procedure vital signs reviewed and stable Respiratory status: spontaneous breathing Cardiovascular status: stable Anesthetic complications: no    Last Vitals:  Vitals:   12/15/15 1201 12/15/15 1206  BP: (!) 157/71 (!) 124/47  Pulse: 71   Resp: 18   Temp: 36.9 C     Last Pain:  Vitals:   12/15/15 1014  TempSrc: Oral                 VAN STAVEREN,Bronislaw Switzer

## 2015-12-15 NOTE — Op Note (Signed)
Date of Surgery: 12/15/2015 Date of Dictation: 12/15/2015 12:01 PM Pre-operative Diagnosis:  Nuclear Sclerotic Cataract right Eye Post-operative Diagnosis: same Procedure performed: Extra-capsular Cataract Extraction (ECCE) with placement of a posterior chamber intraocular lens (IOL) right Eye IOL:  Implant Name Type Inv. Item Serial No. Manufacturer Lot No. LRB No. Used  LENS IOL ACRYSOF IQ 21.0 - [id number] Intraocular Lens LENS IOL ACRYSOF IQ 21.0 [id number] ALCON   Right 1   Anesthesia: 2% Lidocaine and 4% Marcaine in a 50/50 mixture with 10 unites/ml of Hylenex given as a peribulbar Anesthesiologist: Anesthesiologist: Gijsbertus Lonia Mad, MD CRNA: Marsh Dolly, CRNA Complications: none Estimated Blood Loss: less than 1 ml  Description of procedure:  The patient was given anesthesia and sedation via intravenous access. The patient was then prepped and draped in the usual fashion. A 25-gauge needle was bent for initiating the capsulorhexis. A 5-0 silk suture was placed through the conjunctiva superior and inferiorly to serve as bridle sutures. Hemostasis was obtained at the superior limbus using an eraser cautery. A partial thickness groove was made at the anterior surgical limbus with a 64 Beaver blade and this was dissected anteriorly with an [employer]aya. The anterior chamber was entered at 10 o'clock with a 1.0 mm paracentesis knife and through the lamellar dissection with a 2.6 mm Alcon keratome. Epi-Shugarcaine 0.5 CC [9 cc BSS Plus (Alcon), 3 cc 4% preservative-free lidocaine (Hospira) and 4 cc 1:1000 preservative-free, bisulfite-free epinephrine] was injected into the anterior chamber via the paracentesis tract. Epi-Shugarcaine 0.5 CC [9 cc BSS Plus (Alcon), 3 cc 4% preservative-free lidocaine (Hospira) and 4 cc 1:1000 preservative-free, bisulfite-free epinephrine] was injected into the anterior chamber via the paracentesis tract. DiscoVisc was injected to replace the  aqueous and a continuous tear curvilinear capsulorhexis was performed using a bent 25-gauge needle.  Balance salt on a syringe was used to perform hydro-dissection and phacoemulsification was carried out using a divide and conquer technique. Procedure(s) with comments: CATARACT EXTRACTION PHACO AND INTRAOCULAR LENS PLACEMENT (IOC) (Right) - [place] 01:20 AP% 23.9 CDE 35.49 Fluid pack lot # [id number]. Irrigation/aspiration was used to remove the residual cortex and the capsular bag was inflated with DiscoVisc. The intraocular lens was inserted into the capsular bag using a pre-loaded UltraSert Delivery System. Irrigation/aspiration was used to remove the residual DiscoVisc. The wound was inflated with balanced salt and checked for leaks. None were found. Miostat was injected via the paracentesis track and 0.1 ml of cefuroxime containing 1 mg of drug  was injected via the paracentesis track. The wound was checked for leaks again and none were found.   The bridal sutures were removed and two drops of Vigamox were placed on the eye. An eye shield was placed to protect the eye and the patient was discharged to the recovery area in good condition.   Lakelyn Straus MD

## 2015-12-15 NOTE — Transfer of Care (Signed)
Immediate Anesthesia Transfer of Care Note  Patient: Doris Barnes  Procedure(s) Performed: Procedure(s) with comments: CATARACT EXTRACTION PHACO AND INTRAOCULAR LENS PLACEMENT ([place]) (Right) - [place] 01:20 AP% 23.9 CDE 35.49 Fluid pack lot # [id number]  Patient Location: PACU  Anesthesia Type:MAC  Level of Consciousness: awake, alert  and oriented  Airway & Oxygen Therapy: Patient Spontanous Breathing  Post-op Assessment: Report given to RN and Post -op Vital signs reviewed and stable  Post vital signs: Reviewed and stable  Last Vitals:  Vitals:   12/15/15 1014  BP: (!) 144/70  Pulse: (!) 59  Resp: 16  Temp: 36.7 C    Last Pain:  Vitals:   12/15/15 1014  TempSrc: Oral         Complications: No apparent anesthesia complications

## 2015-12-15 NOTE — Anesthesia Preprocedure Evaluation (Signed)
Anesthesia Evaluation  Patient identified by MRN, date of birth, ID band Patient awake    Reviewed: Allergy & Precautions, NPO status   Airway Mallampati: II       Dental  (+) Teeth Intact, Upper Dentures   Pulmonary neg pulmonary ROS, former smoker,    breath sounds clear to auscultation       Cardiovascular Exercise Tolerance: Good hypertension, Pt. on home beta blockers  Rhythm:Regular     Neuro/Psych  Headaches, TIA   GI/Hepatic Neg liver ROS, hiatal hernia, GERD  Medicated,  Endo/Other  Hypothyroidism   Renal/GU negative Renal ROS     Musculoskeletal   Abdominal Normal abdominal exam  (+)   Peds  Hematology  (+) anemia ,   Anesthesia Other Findings   Reproductive/Obstetrics                             Anesthesia Physical Anesthesia Plan  ASA: II  Anesthesia Plan: MAC   Post-op Pain Management:    Induction: Intravenous  Airway Management Planned: Natural Airway and Nasal Cannula  Additional Equipment:   Intra-op Plan:   Post-operative Plan:   Informed Consent: I have reviewed the patients History and Physical, chart, labs and discussed the procedure including the risks, benefits and alternatives for the proposed anesthesia with the patient or authorized representative who has indicated his/her understanding and acceptance.     Plan Discussed with: CRNA  Anesthesia Plan Comments:         Anesthesia Quick Evaluation

## 2015-12-15 NOTE — Interval H&P Note (Signed)
History and Physical Interval Note:  12/15/2015 11:18 AM  Doris Barnes  has presented today for surgery, with the diagnosis of CATARACT  The various methods of treatment have been discussed with the patient and family. After consideration of risks, benefits and other options for treatment, the patient has consented to  Procedure(s): CATARACT EXTRACTION PHACO AND INTRAOCULAR LENS PLACEMENT ([place]) (Right) as a surgical intervention .  The patient's history has been reviewed, patient examined, no change in status, stable for surgery.  I have reviewed the patient's chart and labs.  Questions were answered to the patient's satisfaction.     Zavian Slowey

## 2015-12-18 ENCOUNTER — Emergency Department: Payer: Commercial Managed Care - HMO

## 2015-12-18 ENCOUNTER — Encounter: Payer: Self-pay | Admitting: Emergency Medicine

## 2015-12-18 ENCOUNTER — Emergency Department
Admission: EM | Admit: 2015-12-18 | Discharge: 2015-12-18 | Disposition: A | Discharge status: Home / Self Care | Payer: Commercial Managed Care - HMO | Attending: Emergency Medicine | Admitting: Emergency Medicine

## 2015-12-18 DIAGNOSIS — Z79899 Other long term (current) drug therapy: Secondary | ICD-10-CM | POA: Insufficient documentation

## 2015-12-18 DIAGNOSIS — I493 Ventricular premature depolarization: Secondary | ICD-10-CM | POA: Diagnosis not present

## 2015-12-18 DIAGNOSIS — F410 Panic disorder [episodic paroxysmal anxiety] without agoraphobia: Secondary | ICD-10-CM | POA: Diagnosis not present

## 2015-12-18 DIAGNOSIS — I10 Essential (primary) hypertension: Secondary | ICD-10-CM | POA: Diagnosis not present

## 2015-12-18 DIAGNOSIS — Z7982 Long term (current) use of aspirin: Secondary | ICD-10-CM | POA: Insufficient documentation

## 2015-12-18 DIAGNOSIS — E039 Hypothyroidism, unspecified: Secondary | ICD-10-CM | POA: Insufficient documentation

## 2015-12-18 DIAGNOSIS — Z87891 Personal history of nicotine dependence: Secondary | ICD-10-CM | POA: Insufficient documentation

## 2015-12-18 DIAGNOSIS — R0789 Other chest pain: Secondary | ICD-10-CM | POA: Diagnosis not present

## 2015-12-18 DIAGNOSIS — R000 Tachycardia, unspecified: Secondary | ICD-10-CM | POA: Diagnosis not present

## 2015-12-18 DIAGNOSIS — R079 Chest pain, unspecified: Secondary | ICD-10-CM | POA: Diagnosis not present

## 2015-12-18 LAB — URINALYSIS COMPLETE WITH MICROSCOPIC (ARMC ONLY)
Bacteria, UA: NONE SEEN
Bilirubin Urine: NEGATIVE
Glucose, UA: NEGATIVE mg/dL
Hgb urine dipstick: NEGATIVE
Ketones, ur: NEGATIVE mg/dL
Leukocytes, UA: NEGATIVE
Nitrite: NEGATIVE
Protein, ur: NEGATIVE mg/dL
RBC / HPF: NONE SEEN RBC/hpf (ref 0–5)
Specific Gravity, Urine: 1.003 — ABNORMAL LOW (ref 1.005–1.030)
pH: 6 (ref 5.0–8.0)

## 2015-12-18 LAB — CBC WITH DIFFERENTIAL/PLATELET
Basophils Absolute: 0 10*3/uL (ref 0–0.1)
Basophils Relative: 0 %
Eosinophils Absolute: 0.3 10*3/uL (ref 0–0.7)
Eosinophils Relative: 4 %
HCT: 43 % (ref 35.0–47.0)
Hemoglobin: 14.9 g/dL (ref 12.0–16.0)
Lymphocytes Relative: 17 %
Lymphs Abs: 1.2 10*3/uL (ref 1.0–3.6)
MCH: 30 pg (ref 26.0–34.0)
MCHC: 34.6 g/dL (ref 32.0–36.0)
MCV: 86.7 fL (ref 80.0–100.0)
Monocytes Absolute: 0.6 10*3/uL (ref 0.2–0.9)
Monocytes Relative: 8 %
Neutro Abs: 5.2 10*3/uL (ref 1.4–6.5)
Neutrophils Relative %: 71 %
Platelets: 211 10*3/uL (ref 150–440)
RBC: 4.96 MIL/uL (ref 3.80–5.20)
RDW: 14.9 % — ABNORMAL HIGH (ref 11.5–14.5)
WBC: 7.2 10*3/uL (ref 3.6–11.0)

## 2015-12-18 LAB — BASIC METABOLIC PANEL
Anion gap: 6 (ref 5–15)
BUN: 10 mg/dL (ref 6–20)
CO2: 31 mmol/L (ref 22–32)
Calcium: 9.6 mg/dL (ref 8.9–10.3)
Chloride: 97 mmol/L — ABNORMAL LOW (ref 101–111)
Creatinine, Ser: 0.76 mg/dL (ref 0.44–1.00)
GFR calc Af Amer: >60 mL/min (ref >60)
GFR calc non Af Amer: >60 mL/min (ref >60)
Glucose, Bld: 150 mg/dL — ABNORMAL HIGH (ref 65–99)
Potassium: 3.4 mmol/L — ABNORMAL LOW (ref 3.5–5.1)
Sodium: 134 mmol/L — ABNORMAL LOW (ref 135–145)

## 2015-12-18 LAB — TROPONIN I
Troponin I: <0.03 ng/mL (ref <0.03)
Troponin I: <0.03 ng/mL (ref <0.03)

## 2015-12-18 MED ORDER — ALPRAZOLAM 0.5 MG PO TABS: 0.5000 mg | ORAL_TABLET | Freq: Three times a day (TID) | ORAL | 0 refills | Status: DC | PRN

## 2015-12-18 MED ORDER — ATENOLOL 25 MG PO TABS
50.0000 mg | ORAL_TABLET | Freq: Once | ORAL | Status: AC
  Administered 2015-12-18: 50 mg via ORAL
  Filled 2015-12-18: qty 2

## 2015-12-18 MED ORDER — DILTIAZEM HCL 25 MG/5ML IV SOLN
10.0000 mg | Freq: Once | INTRAVENOUS | Status: AC
  Administered 2015-12-18: 10 mg via INTRAVENOUS

## 2015-12-18 MED ORDER — DILTIAZEM HCL 25 MG/5ML IV SOLN
INTRAVENOUS | Status: AC
  Administered 2015-12-18: 10 mg via INTRAVENOUS
  Filled 2015-12-18: qty 5

## 2015-12-18 MED ORDER — IOPAMIDOL (ISOVUE-370) INJECTION 76%
60.0000 mL | Freq: Once | INTRAVENOUS | Status: AC | PRN
  Administered 2015-12-18: 60 mL via INTRAVENOUS

## 2015-12-18 MED ORDER — LORAZEPAM 2 MG/ML IJ SOLN
1.0000 mg | Freq: Once | INTRAMUSCULAR | Status: AC
  Administered 2015-12-18: 1 mg via INTRAVENOUS
  Filled 2015-12-18: qty 1

## 2015-12-18 NOTE — ED Triage Notes (Signed)
Pt presents with tachycardia started this am acute onset. Pt denies any shortness of breath and or chest pain .

## 2015-12-18 NOTE — ED Provider Notes (Signed)
Patient is in no distress, does appear very anxious, this improved after IV Ativan. I will consider increasing her Xanax and having her follow-up as an outpatient with cardiology. She is much improved after the Ativan in stable for discharge at this time. EKG interpreted by me prior to Ativan reveals sinus tachycardia with a rate of 143 bpm, normal PR interval, normal QRS size, normal QT interval. ST depressions are noted.   Earleen Newport, MD 12/18/15 [phone]

## 2015-12-18 NOTE — ED Notes (Signed)
Reports cataract surgery 4 days ago, today after taking eye drops she felt her hear beating "funny".  Skin w/d.  Sinus tach on monitor with PVCs. Right pupil constricted.  States it has been that way since the surgery and was told to expect that.  Small dime sized bruise noted to corner of right eye.  No resp distress.

## 2015-12-18 NOTE — ED Provider Notes (Signed)
[facility] Emergency Department Provider Note ____________________________________________   I have reviewed the triage vital signs and the triage nursing note.  HISTORY  Chief Complaint Tachycardia   Historian Patient  HPI Doris Barnes is a 75 y.o. female with history of anxiety, difficult to control hypertension, and recent right eye cataract surgery on Thursday, presenting today after waking up with chest pressure and heart racing, as well as palpitations. She states that her primary doctor has been changing her blood pressure medications in order to get better control, but she cannot exactly remember what she was taken off of in the last couple weeks. She does take atenolol. She denies leg pain or swelling. She denies shortness of breath or pleuritic chest pain. She denies history of blood clots. She denies recent cough, congestion, or fevers. Symptoms are moderate. Nothing makes it worse or better. Denies new anxiety or trigger symptoms.   Past Medical History:  Diagnosis Date  . Anemia   . Anxiety   . Cancer ([place]) skin  . Chronic vulvitis   . Colon polyp   . Cystocele   . Diverticulitis   . Diverticulosis   . Dyspareunia, female   . Fibrocystic disease of both breasts   . Gastritis   . GERD (gastroesophageal reflux disease)    gastritis also  . Hemorrhoids   . History of hiatal hernia   . Hypercholesteremia   . Hypertension   . Hypothyroid   . IBS (irritable bowel syndrome)   . IC (interstitial cystitis)   . Migraine   . Mitral valve prolapse   . OP (osteoporosis)   . Positional vertigo   . Rectocele   . Squamous cell carcinoma ([place])   . Vaginal atrophy     Patient Active Problem List   Diagnosis Date Noted  . Psoriasiform dermatitis 01/07/2015  . Bloodgood disease 12/31/2014  . HLD (hyperlipidemia) 12/31/2014  . Headache, migraine 12/31/2014  . Billowing mitral valve 12/31/2014  . Osteoporosis, post-menopausal 12/31/2014  .  Squamous cell carcinoma ([place]) 12/31/2014  . Essential hypertension 09/18/2014  . Degeneration of cervical intervertebral disc 09/18/2014  . Ataxia 09/17/2014  . Adaptive colitis 02/02/2014    Past Surgical History:  Procedure Laterality Date  . APPENDECTOMY    . BREAST BIOPSY Left 1998   core bx- neg  . BREAST BIOPSY Left 2007   neg  . CARDIAC CATHETERIZATION N/A 01/18/2015   Procedure: Left Heart Cath and Coronary Angiography;  Surgeon: Teodoro Spray, MD;  Location: [place] [facility];  Service: Cardiovascular;  Laterality: N/A;  . CATARACT EXTRACTION W/PHACO Right 12/15/2015   Procedure: CATARACT EXTRACTION PHACO AND INTRAOCULAR LENS PLACEMENT (I[facility]);  Surgeon: Estill Cotta, MD;  Location: [facility] ORS;  Service: Ophthalmology;  Laterality: Right;  [place] 01:20 AP% 23.9 CDE 35.49 Fluid pack lot # [id number]  . COLON SURGERY    . DILATION AND CURETTAGE OF UTERUS    . OOPHORECTOMY Bilateral   . TONSILLECTOMY    . VAGINAL HYSTERECTOMY      Prior to Admission medications   Medication Sig Start Date End Date Taking? Authorizing Provider  acetaminophen (TYLENOL) 325 MG tablet Take 650 mg by mouth every 6 (six) hours as needed for mild pain.   Yes Historical Provider, MD  ALPRAZolam (XANAX) 0.25 MG tablet 0.25 mg 2 (two) times daily.  11/17/14  Yes Historical Provider, MD  aspirin EC 325 MG tablet Take 325 mg by mouth daily.    Yes Historical Provider, MD  atenolol (  TENORMIN) 50 MG tablet Take 50 mg by mouth daily.  07/24/14  Yes Historical Provider, MD  calcium citrate-vitamin D (CITRACAL+D) 315-200 MG-UNIT tablet Take 1 tablet by mouth 2 (two) times daily.   Yes Historical Provider, MD  Difluprednate (DUREZOL) 0.05 % EMUL Place 1 drop into the right eye 2 (two) times daily. Breakfast, bedtime    Yes Historical Provider, MD  FLUoxetine (PROZAC) 20 MG capsule Take 20 mg by mouth at bedtime.  07/24/14 12/18/15 Yes Historical Provider, MD  hydrochlorothiazide (HYDRODIURIL) 25 MG tablet Take  12.5 mg by mouth daily.  07/24/14  Yes Historical Provider, MD  lansoprazole (PREVACID) 30 MG capsule Take 30 mg by mouth daily.  07/24/14  Yes Historical Provider, MD  moxifloxacin (VIGAMOX) 0.5 % ophthalmic solution Place 1 drop into the right eye 4 (four) times daily. Breakfast,lunch,supper,bedtime    Yes Historical Provider, MD  Multiple Vitamins-Minerals (PRESERVISION AREDS 2) CAPS Take 2 capsules by mouth daily.   Yes Historical Provider, MD  nepafenac (ILEVRO) 0.3 % ophthalmic suspension 1 drop daily. bedtime   Yes Historical Provider, MD  simvastatin (ZOCOR) 20 MG tablet Take 20 mg by mouth daily at 6 PM.  07/24/14  Yes Historical Provider, MD  telmisartan (MICARDIS) 40 MG tablet Take 40 mg by mouth daily.   Yes Historical Provider, MD  clobetasol ointment (TEMOVATE) [id number] % Apply 1 application topically 2 (two) times daily. Use twice a day (morning and night) for 1 month. Then daily. Patient not taking: Reported on 12/15/2015 12/31/14   Alanda Slim Defrancesco, MD  fluticasone (FLONASE) 50 MCG/ACT nasal spray Place 2 sprays into both nostrils daily as needed.     Historical Provider, MD  hydrOXYzine (ATARAX/VISTARIL) 25 MG tablet Take 1 tablet (25 mg total) by mouth at bedtime as needed. Patient not taking: Reported on 12/15/2015 12/31/14   Alanda Slim Defrancesco, MD  loratadine (CLARITIN) 10 MG tablet Take 10 mg by mouth daily.  03/19/14   Historical Provider, MD  meclizine (ANTIVERT) 12.5 MG tablet Take 1 tablet (12.5 mg total) by mouth 3 (three) times daily. Patient not taking: Reported on 12/15/2015 09/21/14   Rusty Aus, MD  ondansetron [facility]) 4 MG/2ML SOLN injection Inject 2 mLs (4 mg total) into the vein once as needed for nausea. Patient not taking: Reported on 12/18/2015 12/15/15   Estill Cotta, MD  phenylephrine (NEO-SYNEPHRINE) 10 % ophthalmic solution Place 1 drop into the right eye every 15 (fifteen) minutes. Patient not taking: Reported on 12/18/2015 12/15/15   Estill Cotta, MD     Allergies  Allergen Reactions  . Epinephrine Hcl   . Imipramine Pamoate   . Sulfa Antibiotics Rash    Face turns red and stings    Family History  Problem Relation Age of Onset  . Diabetes Mother   . Diabetes Father   . Colon cancer Sister   . Diabetes Sister   . Diabetes Maternal Aunt   . Colon cancer Paternal Grandfather   . Breast cancer Neg Hx   . Ovarian cancer Neg Hx     Social History Social History  Substance Use Topics  . Smoking status: Former Smoker    Packs/day: 1.00    Years: 33.00  . Smokeless tobacco: Never Used  . Alcohol use No    Review of Systems  Constitutional: Negative for fever. Eyes: Negative for visual changes. ENT: Negative for sore throat. Cardiovascular: Negative for chest pain.Positive for chest pressure and palpitations. Respiratory: Negative for shortness of breath. Gastrointestinal: Negative  for abdominal pain, vomiting and diarrhea. Genitourinary: Negative for dysuria. Musculoskeletal: Negative for back pain. Skin: Negative for rash. Neurological: Negative for headache. 10 point Review of Systems otherwise negative ____________________________________________   PHYSICAL EXAM:  VITAL SIGNS: ED Triage Vitals [12/18/15 1130]  Enc Vitals Group     BP (!) 167/76     Pulse Rate (!) 103     Resp (!) 21     Temp      Temp src      SpO2      Weight      Height      Head Circumference      Peak Flow      Pain Score      Pain Loc      Pain Edu?      Excl. in [place]?      Constitutional: Alert and oriented. Well appearing and in no distress. Somewhat anxious. HEENT   Head: Normocephalic and atraumatic.      Eyes: Conjunctivae are normal. PERRL. Normal extraocular movements. Right eye periorbital area bruised      Ears:         Nose: No congestion/rhinnorhea.   Mouth/Throat: Mucous membranes are moist.   Neck: No stridor. Cardiovascular/Chest: Tachycardic and irregularly irregular.  No murmurs, rubs, or  gallops. Respiratory: Normal respiratory effort without tachypnea nor retractions. Breath sounds are clear and equal bilaterally. No wheezes/rales/rhonchi. Gastrointestinal: Soft. No distention, no guarding, no rebound. Nontender.    Genitourinary/rectal:Deferred Musculoskeletal: Nontender with normal range of motion in all extremities. No joint effusions.  No lower extremity tenderness.  No edema. Neurologic:  Normal speech and language. No gross or focal neurologic deficits are appreciated. Skin:  Skin is warm, dry and intact. No rash noted. Psychiatric: Mood and affect are normal. Speech and behavior are normal. Patient exhibits appropriate insight and judgment.   ____________________________________________  LABS (pertinent positives/negatives)  Labs Reviewed  BASIC METABOLIC PANEL - Abnormal; Notable for the following:       Result Value   Sodium 134 (*)    Potassium 3.4 (*)    Chloride 97 (*)    Glucose, Bld 150 (*)    All other components within normal limits  CBC WITH DIFFERENTIAL/PLATELET - Abnormal; Notable for the following:    RDW 14.9 (*)    All other components within normal limits  URINALYSIS COMPLETEWITH MICROSCOPIC ([facility] ONLY) - Abnormal; Notable for the following:    Color, Urine STRAW (*)    APPearance CLEAR (*)    Specific Gravity, Urine 1.003 (*)    Squamous Epithelial / LPF 0-5 (*)    All other components within normal limits  TROPONIN I  TROPONIN I    ____________________________________________    EKG I, Lisa Roca, MD, the attending physician have personally viewed and interpreted all ECGs.  126 beats per minute. Sinus tachycardia with occasional PVC. Narrow QRS. Normal axis. Some ST segment depression laterally which appears to be LVH with repolarization changes. ____________________________________________  RADIOLOGY All Xrays were viewed by me. Imaging interpreted by Radiologist.  Chest x-ray two-view:IMPRESSION: 1.  No acute  cardiopulmonary disease. 2. Mild peribronchial thickening which may relate to chronic bronchitis or smoking. __________________________________________  PROCEDURES  Procedure(s) performed: None  Critical Care performed: None  ____________________________________________   ED COURSE / ASSESSMENT AND PLAN  Pertinent labs & imaging results that were available during my care of the patient were reviewed by me and considered in my medical decision making (see chart for details).  Ms. Cericola is here for mostly palpitations and heart racing along with some chest discomfort although it seems like the palpitations is more significant than the chest pain per se.  Initially her heart rate was down into the 90s when I was examining her and then went up into the 1 teens with PVCs.  Laboratory studies are reassuring. I think her symptoms are less likely be related to ACS. She did have a recent cataract surgery, in terms of immobilization and surgery as a risk factor for PE, she does state that she did lay around the house for 2 days. She's not really short of breath, but with the persistent and uncomfortable tachycardia, I discussed with her obtaining CT to rule out PE. I am going to send a repeat troponin.  After few hours her heart rate was remaining elevated around 115 with frequent PVCs. I am going to increase her atenolol, and give her an extra dose of 50 mg atenolol now.  Patient care transferred to Dr. Jimmye Norman 4pm at shift change, pending CT PE, troponin and recheck heart rate.    CONSULTATIONS: None Patient / Family / Caregiver informed of clinical course, medical decision-making process, and agree with plan.   I discussed return precautions, follow-up instructions, and discharge instructions with patient and/or family.   ___________________________________________   FINAL CLINICAL IMPRESSION(S) / ED DIAGNOSES   Final diagnoses:  Frequent PVCs  Sinus tachycardia ([place])               Note: This dictation was prepared with Dragon dictation. Any transcriptional errors that result from this process are unintentional    Lisa Roca, MD 12/18/15 1554

## 2015-12-21 DIAGNOSIS — R0789 Other chest pain: Secondary | ICD-10-CM | POA: Diagnosis not present

## 2015-12-21 DIAGNOSIS — I471 Supraventricular tachycardia: Secondary | ICD-10-CM | POA: Diagnosis not present

## 2015-12-24 DIAGNOSIS — H2512 Age-related nuclear cataract, left eye: Secondary | ICD-10-CM | POA: Diagnosis not present

## 2015-12-27 ENCOUNTER — Encounter: Payer: Self-pay | Admitting: *Deleted

## 2015-12-28 NOTE — H&P (Signed)
See scanned note.

## 2015-12-29 ENCOUNTER — Ambulatory Visit: Payer: Commercial Managed Care - HMO | Admitting: Registered Nurse

## 2015-12-29 ENCOUNTER — Encounter: Payer: Self-pay | Admitting: *Deleted

## 2015-12-29 ENCOUNTER — Encounter
Admission: RE | Disposition: A | Discharge status: Home / Self Care | Payer: Self-pay | Source: Ambulatory Visit | Attending: Ophthalmology

## 2015-12-29 ENCOUNTER — Ambulatory Visit
Admission: RE | Admit: 2015-12-29 | Discharge: 2015-12-29 | Disposition: A | Discharge status: Home / Self Care | Payer: Commercial Managed Care - HMO | Source: Ambulatory Visit | Attending: Ophthalmology | Admitting: Ophthalmology

## 2015-12-29 DIAGNOSIS — I10 Essential (primary) hypertension: Secondary | ICD-10-CM | POA: Diagnosis not present

## 2015-12-29 DIAGNOSIS — D649 Anemia, unspecified: Secondary | ICD-10-CM | POA: Insufficient documentation

## 2015-12-29 DIAGNOSIS — Z7982 Long term (current) use of aspirin: Secondary | ICD-10-CM | POA: Diagnosis not present

## 2015-12-29 DIAGNOSIS — E7800 Pure hypercholesterolemia, unspecified: Secondary | ICD-10-CM | POA: Insufficient documentation

## 2015-12-29 DIAGNOSIS — K449 Diaphragmatic hernia without obstruction or gangrene: Secondary | ICD-10-CM | POA: Diagnosis not present

## 2015-12-29 DIAGNOSIS — Z79899 Other long term (current) drug therapy: Secondary | ICD-10-CM | POA: Insufficient documentation

## 2015-12-29 DIAGNOSIS — K219 Gastro-esophageal reflux disease without esophagitis: Secondary | ICD-10-CM | POA: Diagnosis not present

## 2015-12-29 DIAGNOSIS — F419 Anxiety disorder, unspecified: Secondary | ICD-10-CM | POA: Diagnosis not present

## 2015-12-29 DIAGNOSIS — Z87891 Personal history of nicotine dependence: Secondary | ICD-10-CM | POA: Diagnosis not present

## 2015-12-29 DIAGNOSIS — J45909 Unspecified asthma, uncomplicated: Secondary | ICD-10-CM | POA: Insufficient documentation

## 2015-12-29 DIAGNOSIS — H2512 Age-related nuclear cataract, left eye: Secondary | ICD-10-CM | POA: Diagnosis not present

## 2015-12-29 DIAGNOSIS — E039 Hypothyroidism, unspecified: Secondary | ICD-10-CM | POA: Insufficient documentation

## 2015-12-29 HISTORY — DX: Unspecified asthma, uncomplicated: J45.909

## 2015-12-29 HISTORY — DX: Cerebral infarction, unspecified: I63.9

## 2015-12-29 HISTORY — PX: CATARACT EXTRACTION W/PHACO: SHX586

## 2015-12-29 SURGERY — PHACOEMULSIFICATION, CATARACT, WITH IOL INSERTION
Anesthesia: Monitor Anesthesia Care | Site: Eye | Laterality: Left | Wound class: Clean

## 2015-12-29 MED ORDER — LIDOCAINE HCL (PF) 4 % IJ SOLN
INTRAOCULAR | Status: DC | PRN
  Administered 2015-12-29: .5 mL via OPHTHALMIC

## 2015-12-29 MED ORDER — SODIUM CHLORIDE 0.9 % IV SOLN
INTRAVENOUS | Status: DC
  Administered 2015-12-29: 07:00:00 via INTRAVENOUS

## 2015-12-29 MED ORDER — CEFUROXIME OPHTHALMIC INJECTION 1 MG/0.1 ML
INJECTION | OPHTHALMIC | Status: DC | PRN
  Administered 2015-12-29: .1 mL via INTRACAMERAL

## 2015-12-29 MED ORDER — PHENYLEPHRINE HCL 10 % OP SOLN
1.0000 [drp] | OPHTHALMIC | Status: AC
  Administered 2015-12-29 (×3): 1 [drp] via OPHTHALMIC

## 2015-12-29 MED ORDER — NA CHONDROIT SULF-NA HYALURON 40-17 MG/ML IO SOLN
INTRAOCULAR | Status: DC | PRN
  Administered 2015-12-29: 1 mL via INTRAOCULAR

## 2015-12-29 MED ORDER — BUPIVACAINE HCL (PF) 0.75 % IJ SOLN
INTRAMUSCULAR | Status: DC | PRN
  Administered 2015-12-29: 4 mL via OPHTHALMIC

## 2015-12-29 MED ORDER — CYCLOPENTOLATE HCL 2 % OP SOLN
OPHTHALMIC | Status: AC
  Administered 2015-12-29: 1 [drp] via OPHTHALMIC
  Filled 2015-12-29: qty 2

## 2015-12-29 MED ORDER — PHENYLEPHRINE HCL 10 % OP SOLN
OPHTHALMIC | Status: AC
  Administered 2015-12-29: 1 [drp] via OPHTHALMIC
  Filled 2015-12-29: qty 5

## 2015-12-29 MED ORDER — CYCLOPENTOLATE HCL 2 % OP SOLN
1.0000 [drp] | OPHTHALMIC | Status: DC
  Administered 2015-12-29: 1 [drp] via OPHTHALMIC

## 2015-12-29 MED ORDER — POVIDONE-IODINE 5 % OP SOLN
OPHTHALMIC | Status: AC
  Filled 2015-12-29: qty 30

## 2015-12-29 MED ORDER — EPINEPHRINE HCL 1 MG/ML IJ SOLN
INTRAMUSCULAR | Status: AC
  Filled 2015-12-29: qty 2

## 2015-12-29 MED ORDER — BUPIVACAINE HCL (PF) 0.75 % IJ SOLN
INTRAMUSCULAR | Status: AC
Start: 2015-12-29 — End: 2015-12-29
  Filled 2015-12-29: qty 10

## 2015-12-29 MED ORDER — ONDANSETRON HCL 4 MG/2ML IJ SOLN
INTRAMUSCULAR | Status: DC | PRN
Start: 2015-12-29 — End: 2015-12-29
  Administered 2015-12-29: 4 mg via INTRAVENOUS

## 2015-12-29 MED ORDER — MOXIFLOXACIN HCL 0.5 % OP SOLN
1.0000 [drp] | OPHTHALMIC | Status: AC
  Administered 2015-12-29 (×2): 1 [drp] via OPHTHALMIC

## 2015-12-29 MED ORDER — HYALURONIDASE HUMAN 150 UNIT/ML IJ SOLN
INTRAMUSCULAR | Status: AC
  Filled 2015-12-29: qty 1

## 2015-12-29 MED ORDER — MOXIFLOXACIN HCL 0.5 % OP SOLN
OPHTHALMIC | Status: AC
  Administered 2015-12-29: 1 [drp] via OPHTHALMIC
  Filled 2015-12-29: qty 3

## 2015-12-29 MED ORDER — ALFENTANIL 500 MCG/ML IJ INJ
INJECTION | INTRAMUSCULAR | Status: DC | PRN
  Administered 2015-12-29: 500 ug via INTRAVENOUS

## 2015-12-29 MED ORDER — TETRACAINE HCL 0.5 % OP SOLN
OPHTHALMIC | Status: DC | PRN
  Administered 2015-12-29: 1 [drp] via OPHTHALMIC

## 2015-12-29 MED ORDER — CYCLOPENTOLATE HCL 2 % OP SOLN
1.0000 [drp] | OPHTHALMIC | Status: AC
  Administered 2015-12-29 (×3): 1 [drp] via OPHTHALMIC

## 2015-12-29 MED ORDER — CEFUROXIME OPHTHALMIC INJECTION 1 MG/0.1 ML
INJECTION | OPHTHALMIC | Status: AC
  Filled 2015-12-29: qty 0.1

## 2015-12-29 MED ORDER — CARBACHOL 0.01 % IO SOLN
INTRAOCULAR | Status: DC | PRN
  Administered 2015-12-29: .5 mL via INTRAOCULAR

## 2015-12-29 MED ORDER — POVIDONE-IODINE 5 % OP SOLN
OPHTHALMIC | Status: DC | PRN
  Administered 2015-12-29: 1 via OPHTHALMIC

## 2015-12-29 MED ORDER — MOXIFLOXACIN HCL 0.5 % OP SOLN
OPHTHALMIC | Status: DC | PRN
  Administered 2015-12-29: 1 [drp] via OPHTHALMIC

## 2015-12-29 MED ORDER — MIDAZOLAM HCL 2 MG/2ML IJ SOLN
INTRAMUSCULAR | Status: DC | PRN
  Administered 2015-12-29: 1 mg via INTRAVENOUS

## 2015-12-29 MED ORDER — MOXIFLOXACIN HCL 0.5 % OP SOLN
1.0000 [drp] | OPHTHALMIC | Status: DC
  Administered 2015-12-29: 1 [drp] via OPHTHALMIC

## 2015-12-29 MED ORDER — LIDOCAINE HCL (PF) 4 % IJ SOLN
INTRAMUSCULAR | Status: AC
  Filled 2015-12-29: qty 5

## 2015-12-29 MED ORDER — EPINEPHRINE HCL 1 MG/ML IJ SOLN
INTRAOCULAR | Status: DC | PRN
  Administered 2015-12-29: 1 mL via OPHTHALMIC

## 2015-12-29 MED ORDER — TETRACAINE HCL 0.5 % OP SOLN
OPHTHALMIC | Status: AC
  Filled 2015-12-29: qty 2

## 2015-12-29 MED ORDER — PHENYLEPHRINE HCL 10 % OP SOLN
1.0000 [drp] | OPHTHALMIC | Status: DC
  Administered 2015-12-29: 1 [drp] via OPHTHALMIC

## 2015-12-29 SURGICAL SUPPLY — 30 items
CANNULA ANT/CHMB 27G (MISCELLANEOUS) ×1 IMPLANT
CANNULA ANT/CHMB 27GA (MISCELLANEOUS) ×3 IMPLANT
CORD BIP STRL DISP 12FT (MISCELLANEOUS) ×3 IMPLANT
CUP MEDICINE 2OZ PLAST GRAD ST (MISCELLANEOUS) ×3 IMPLANT
DRAPE XRAY CASSETTE 23X24 (DRAPES) ×3 IMPLANT
ERASER HMR WETFIELD 18G (MISCELLANEOUS) ×3 IMPLANT
GLOVE BIO SURGEON STRL SZ8 (GLOVE) ×3 IMPLANT
GLOVE SURG LX 6.5 MICRO (GLOVE) ×2
GLOVE SURG LX 8.0 MICRO (GLOVE) ×2
GLOVE SURG LX STRL 6.5 MICRO (GLOVE) ×1 IMPLANT
GLOVE SURG LX STRL 8.0 MICRO (GLOVE) ×1 IMPLANT
GOWN STRL REUS W/ TWL LRG LVL3 (GOWN DISPOSABLE) ×1 IMPLANT
GOWN STRL REUS W/ TWL XL LVL3 (GOWN DISPOSABLE) ×1 IMPLANT
GOWN STRL REUS W/TWL LRG LVL3 (GOWN DISPOSABLE) ×3
GOWN STRL REUS W/TWL XL LVL3 (GOWN DISPOSABLE) ×3
LENS IOL ACRYSOF IQ 21.0 (Intraocular Lens) ×2 IMPLANT
PACK CATARACT (MISCELLANEOUS) ×3 IMPLANT
PACK CATARACT DINGLEDEIN LX (MISCELLANEOUS) ×3 IMPLANT
PACK EYE AFTER SURG (MISCELLANEOUS) ×3 IMPLANT
SHLD EYE VISITEC  UNIV (MISCELLANEOUS) ×3 IMPLANT
SOL BSS BAG (MISCELLANEOUS) ×3
SOL PREP PVP 2OZ (MISCELLANEOUS) ×3
SOLUTION BSS BAG (MISCELLANEOUS) ×1 IMPLANT
SOLUTION PREP PVP 2OZ (MISCELLANEOUS) ×1 IMPLANT
SUT SILK 5-0 (SUTURE) ×3 IMPLANT
SYR 3ML LL SCALE MARK (SYRINGE) ×3 IMPLANT
SYR 5ML LL (SYRINGE) ×3 IMPLANT
SYR TB 1ML 27GX1/2 LL (SYRINGE) ×3 IMPLANT
WATER STERILE IRR 250ML POUR (IV SOLUTION) ×3 IMPLANT
WIPE NON LINTING 3.25X3.25 (MISCELLANEOUS) ×3 IMPLANT

## 2015-12-29 NOTE — Interval H&P Note (Signed)
History and Physical Interval Note:  12/29/2015 7:26 AM  [name]  has presented today for surgery, with the diagnosis of cataract  The various methods of treatment have been discussed with the patient and family. After consideration of risks, benefits and other options for treatment, the patient has consented to  Procedure(s): CATARACT EXTRACTION PHACO AND INTRAOCULAR LENS PLACEMENT ([place]) (Left) as a surgical intervention .  The patient's history has been reviewed, patient examined, no change in status, stable for surgery.  I have reviewed the patient's chart and labs.  Questions were answered to the patient's satisfaction.     Doris Barnes

## 2015-12-29 NOTE — Discharge Instructions (Signed)

## 2015-12-29 NOTE — Transfer of Care (Signed)
Immediate Anesthesia Transfer of Care Note  Patient: Doris Barnes  Procedure(s) Performed: Procedure(s) with comments: CATARACT EXTRACTION PHACO AND INTRAOCULAR LENS PLACEMENT ([place]) (Left) - [place]  01:20 AP% 25.1 CDE 32.86 Fluid pack lot # [id number]  Patient Location: PHASE II  Anesthesia Type:MAC  Level of Consciousness: Awake, Alert, Oriented  Airway & Oxygen Therapy: Patient Spontanous Breathing and Patient on room air   Post-op Assessment: Report given to RN and Post -op Vital signs reviewed and stable  Post vital signs: Reviewed and stable  Last Vitals:  Vitals:   12/29/15 0619 12/29/15 0812  BP: (!) 150/61 (!) 156/64  Pulse: (!) 59 65  Resp: 16 16  Temp: (!) 35.8 C [id number] C    Complications: No apparent anesthesia complications

## 2015-12-29 NOTE — Op Note (Signed)
Date of Surgery: 12/29/2015 Date of Dictation: 12/29/2015 8:10 AM Pre-operative Diagnosis:  Nuclear Sclerotic Cataract left Eye Post-operative Diagnosis: same Procedure performed: Extra-capsular Cataract Extraction (ECCE) with placement of a posterior chamber intraocular lens (IOL) left Eye IOL:  Implant Name Type Inv. Item Serial No. Manufacturer Lot No. LRB No. Used  LENS IOL ACRYSOF IQ 21.0 - [id number] Intraocular Lens LENS IOL ACRYSOF IQ 21.0 [id number] ALCON [id number] Left 1   Anesthesia: 2% Lidocaine and 4% Marcaine in a 50/50 mixture with 10 unites/ml of Hylenex given as a peribulbar Anesthesiologist: Anesthesiologist: Alvin Critchley, MD CRNA: Doreen Salvage, CRNA Complications: none Estimated Blood Loss: less than 1 ml  Description of procedure:  The patient was given anesthesia and sedation via intravenous access. The patient was then prepped and draped in the usual fashion. A 25-gauge needle was bent for initiating the capsulorhexis. A 5-0 silk suture was placed through the conjunctiva superior and inferiorly to serve as bridle sutures. Hemostasis was obtained at the superior limbus using an eraser cautery. A partial thickness groove was made at the anterior surgical limbus with a 64 Beaver blade and this was dissected anteriorly with an [employer]. The anterior chamber was entered at 10 o'clock with a 1.0 mm paracentesis knife and through the lamellar dissection with a 2.6 mm Alcon keratome. Epi-Shugarcaine 0.5 CC [9 cc BSS Plus (Alcon), 3 cc 4% preservative-free lidocaine (Hospira) and 4 cc 1:1000 preservative-free, bisulfite-free epinephrine] was injected into the anterior chamber via the paracentesis tract. Epi-Shugarcaine 0.5 CC [9 cc BSS Plus (Alcon), 3 cc 4% preservative-free lidocaine (Hospira) and 4 cc 1:1000 preservative-free, bisulfite-free epinephrine] was injected into the anterior chamber via the paracentesis tract. DiscoVisc was injected to replace the aqueous  and a continuous tear curvilinear capsulorhexis was performed using a bent 25-gauge needle.  Balance salt on a syringe was used to perform hydro-dissection and phacoemulsification was carried out using a divide and conquer technique. Procedure(s) with comments: CATARACT EXTRACTION PHACO AND INTRAOCULAR LENS PLACEMENT ([facility]) (Left) - [place]  01:20 AP% 25.1 CDE 32.86 Fluid pack lot # [id number]. Irrigation/aspiration was used to remove the residual cortex and the capsular bag was inflated with DiscoVisc. The intraocular lens was inserted into the capsular bag using a pre-loaded UltraSert Delivery System. Irrigation/aspiration was used to remove the residual DiscoVisc. The wound was inflated with balanced salt and checked for leaks. None were found. Miostat was injected via the paracentesis track and 0.1 ml of cefuroxime containing 1 mg of drug  was injected via the paracentesis track. The wound was checked for leaks again and none were found.   The bridal sutures were removed and two drops of Vigamox were placed on the eye. An eye shield was placed to protect the eye and the patient was discharged to the recovery area in good condition.   Akaya Proffit MD

## 2015-12-29 NOTE — Anesthesia Procedure Notes (Signed)
Procedure Name: MAC Date/Time: 12/29/2015 7:30 AM Performed by: Doreen Salvage Pre-anesthesia Checklist: Patient identified, Emergency Drugs available, Suction available and Patient being monitored Patient Re-evaluated:Patient Re-evaluated prior to inductionOxygen Delivery Method: Nasal cannula

## 2015-12-29 NOTE — Anesthesia Preprocedure Evaluation (Signed)
Anesthesia Evaluation  Patient identified by MRN, date of birth, ID band Patient awake    Reviewed: Allergy & Precautions, NPO status , Patient's Chart, lab work & pertinent test results  Airway Mallampati: II       Dental  (+) Teeth Intact, Upper Dentures   Pulmonary neg pulmonary ROS, asthma , former smoker,    breath sounds clear to auscultation       Cardiovascular Exercise Tolerance: Good hypertension, Pt. on home beta blockers  Rhythm:Regular     Neuro/Psych  Headaches, Anxiety TIA   GI/Hepatic Neg liver ROS, hiatal hernia, GERD  Medicated,  Endo/Other  Hypothyroidism   Renal/GU negative Renal ROS  negative genitourinary   Musculoskeletal  (+) Arthritis , Osteoarthritis,    Abdominal Normal abdominal exam  (+)   Peds negative pediatric ROS (+)  Hematology  (+) anemia ,   Anesthesia Other Findings   Reproductive/Obstetrics                             Anesthesia Physical  Anesthesia Plan  ASA: III  Anesthesia Plan: MAC and General   Post-op Pain Management:    Induction: Intravenous  Airway Management Planned: Natural Airway and Nasal Cannula  Additional Equipment:   Intra-op Plan:   Post-operative Plan:   Informed Consent: I have reviewed the patients History and Physical, chart, labs and discussed the procedure including the risks, benefits and alternatives for the proposed anesthesia with the patient or authorized representative who has indicated his/her understanding and acceptance.     Plan Discussed with: CRNA and Surgeon  Anesthesia Plan Comments:         Anesthesia Quick Evaluation

## 2015-12-30 NOTE — Anesthesia Postprocedure Evaluation (Signed)
Anesthesia Post Note  Patient: Doris Barnes  Procedure(s) Performed: Procedure(s) (LRB): CATARACT EXTRACTION PHACO AND INTRAOCULAR LENS PLACEMENT ([place]) (Left)  Patient location during evaluation: PACU Anesthesia Type: MAC Level of consciousness: awake and alert and oriented Pain management: pain level controlled Vital Signs Assessment: post-procedure vital signs reviewed and stable Respiratory status: spontaneous breathing Cardiovascular status: blood pressure returned to baseline Anesthetic complications: no    Last Vitals:  Vitals:   12/29/15 0812 12/29/15 0814  BP: (!) 156/64 (!) 142/63  Pulse: 65   Resp: 16   Temp: 36.8 C     Last Pain:  Vitals:   12/30/15 0846  TempSrc:   PainSc: 6                  Catarino Vold

## 2016-01-20 DIAGNOSIS — Z961 Presence of intraocular lens: Secondary | ICD-10-CM | POA: Diagnosis not present

## 2016-02-16 ENCOUNTER — Encounter: Payer: Self-pay | Admitting: *Deleted

## 2016-02-21 ENCOUNTER — Ambulatory Visit
Admission: RE | Admit: 2016-02-21 | Discharge: 2016-02-21 | Disposition: A | Discharge status: Home / Self Care | Payer: Commercial Managed Care - HMO | Source: Ambulatory Visit | Attending: Unknown Physician Specialty | Admitting: Unknown Physician Specialty

## 2016-02-21 ENCOUNTER — Encounter: Payer: Self-pay | Admitting: *Deleted

## 2016-02-21 ENCOUNTER — Encounter
Admission: RE | Disposition: A | Discharge status: Home / Self Care | Payer: Self-pay | Source: Ambulatory Visit | Attending: Unknown Physician Specialty

## 2016-02-21 ENCOUNTER — Ambulatory Visit: Payer: Commercial Managed Care - HMO | Admitting: Anesthesiology

## 2016-02-21 DIAGNOSIS — Z1211 Encounter for screening for malignant neoplasm of colon: Secondary | ICD-10-CM | POA: Diagnosis not present

## 2016-02-21 DIAGNOSIS — E039 Hypothyroidism, unspecified: Secondary | ICD-10-CM | POA: Diagnosis not present

## 2016-02-21 DIAGNOSIS — E7800 Pure hypercholesterolemia, unspecified: Secondary | ICD-10-CM | POA: Insufficient documentation

## 2016-02-21 DIAGNOSIS — K5730 Diverticulosis of large intestine without perforation or abscess without bleeding: Secondary | ICD-10-CM | POA: Diagnosis not present

## 2016-02-21 DIAGNOSIS — K219 Gastro-esophageal reflux disease without esophagitis: Secondary | ICD-10-CM | POA: Insufficient documentation

## 2016-02-21 DIAGNOSIS — Z9049 Acquired absence of other specified parts of digestive tract: Secondary | ICD-10-CM | POA: Diagnosis not present

## 2016-02-21 DIAGNOSIS — K640 First degree hemorrhoids: Secondary | ICD-10-CM | POA: Diagnosis not present

## 2016-02-21 DIAGNOSIS — K635 Polyp of colon: Secondary | ICD-10-CM | POA: Insufficient documentation

## 2016-02-21 DIAGNOSIS — D123 Benign neoplasm of transverse colon: Secondary | ICD-10-CM | POA: Insufficient documentation

## 2016-02-21 DIAGNOSIS — Z86010 Personal history of colonic polyps: Secondary | ICD-10-CM | POA: Insufficient documentation

## 2016-02-21 DIAGNOSIS — I10 Essential (primary) hypertension: Secondary | ICD-10-CM | POA: Diagnosis not present

## 2016-02-21 DIAGNOSIS — Z79899 Other long term (current) drug therapy: Secondary | ICD-10-CM | POA: Diagnosis not present

## 2016-02-21 DIAGNOSIS — D128 Benign neoplasm of rectum: Secondary | ICD-10-CM | POA: Diagnosis not present

## 2016-02-21 DIAGNOSIS — Z7982 Long term (current) use of aspirin: Secondary | ICD-10-CM | POA: Insufficient documentation

## 2016-02-21 DIAGNOSIS — Z8673 Personal history of transient ischemic attack (TIA), and cerebral infarction without residual deficits: Secondary | ICD-10-CM | POA: Insufficient documentation

## 2016-02-21 DIAGNOSIS — K5790 Diverticulosis of intestine, part unspecified, without perforation or abscess without bleeding: Secondary | ICD-10-CM | POA: Diagnosis not present

## 2016-02-21 DIAGNOSIS — K621 Rectal polyp: Secondary | ICD-10-CM | POA: Diagnosis not present

## 2016-02-21 DIAGNOSIS — D122 Benign neoplasm of ascending colon: Secondary | ICD-10-CM | POA: Diagnosis not present

## 2016-02-21 DIAGNOSIS — K649 Unspecified hemorrhoids: Secondary | ICD-10-CM | POA: Diagnosis not present

## 2016-02-21 DIAGNOSIS — F419 Anxiety disorder, unspecified: Secondary | ICD-10-CM | POA: Diagnosis not present

## 2016-02-21 DIAGNOSIS — Z87891 Personal history of nicotine dependence: Secondary | ICD-10-CM | POA: Insufficient documentation

## 2016-02-21 HISTORY — PX: COLONOSCOPY WITH PROPOFOL: SHX5780

## 2016-02-21 SURGERY — COLONOSCOPY WITH PROPOFOL
Anesthesia: General

## 2016-02-21 MED ORDER — LIDOCAINE 2% (20 MG/ML) 5 ML SYRINGE
INTRAMUSCULAR | Status: DC | PRN
  Administered 2016-02-21: 40 mg via INTRAVENOUS

## 2016-02-21 MED ORDER — SODIUM CHLORIDE 0.9 % IV SOLN: INTRAVENOUS | Status: DC

## 2016-02-21 MED ORDER — PROPOFOL 10 MG/ML IV BOLUS
INTRAVENOUS | Status: DC | PRN
  Administered 2016-02-21: 100 mg via INTRAVENOUS

## 2016-02-21 MED ORDER — PHENYLEPHRINE HCL 10 MG/ML IJ SOLN
INTRAMUSCULAR | Status: DC | PRN
  Administered 2016-02-21 (×2): 100 ug via INTRAVENOUS

## 2016-02-21 MED ORDER — FENTANYL CITRATE (PF) 100 MCG/2ML IJ SOLN
INTRAMUSCULAR | Status: DC | PRN
  Administered 2016-02-21: 50 ug via INTRAVENOUS

## 2016-02-21 MED ORDER — SODIUM CHLORIDE 0.9 % IV SOLN
INTRAVENOUS | Status: DC
  Administered 2016-02-21 (×2): via INTRAVENOUS

## 2016-02-21 MED ORDER — PROPOFOL 500 MG/50ML IV EMUL
INTRAVENOUS | Status: DC | PRN
  Administered 2016-02-21: 140 ug/kg/min via INTRAVENOUS

## 2016-02-21 MED ORDER — GLYCOPYRROLATE 0.2 MG/ML IJ SOLN
INTRAMUSCULAR | Status: DC | PRN
  Administered 2016-02-21: 0.1 mg via INTRAVENOUS

## 2016-02-21 MED ORDER — MIDAZOLAM HCL 5 MG/5ML IJ SOLN
INTRAMUSCULAR | Status: DC | PRN
  Administered 2016-02-21: 1 mg via INTRAVENOUS

## 2016-02-21 NOTE — Op Note (Signed)
[facility] Gastroenterology Patient Name: Doris Barnes Procedure Date: 02/21/2016 1:19 PM MRN: [id number] Account #: [id number] Date of Birth: [birth year] Admit Type: Outpatient Age: 75 Room: [facility] ENDO ROOM 4 Gender: Female Note Status: Finalized Procedure:            Colonoscopy Indications:          High risk colon cancer surveillance: Personal history                        of colonic polyps Providers:            Manya Silvas, MD Referring MD:         Rusty Aus, MD (Referring MD) Medicines:            Propofol per Anesthesia Complications:        No immediate complications. Procedure:            Pre-Anesthesia Assessment:                       - After reviewing the risks and benefits, the patient                        was deemed in satisfactory condition to undergo the                        procedure.                       After obtaining informed consent, the colonoscope was                        passed under direct vision. Throughout the procedure,                        the patient's blood pressure, pulse, and oxygen                        saturations were monitored continuously. The                        Colonoscope was introduced through the anus and                        advanced to the the cecum, identified by appendiceal                        orifice and ileocecal valve. The colonoscopy was                        performed without difficulty. The patient tolerated the                        procedure well. The quality of the bowel preparation                        was excellent. Findings:      A small polyp was found in the proximal ascending colon. The polyp was       sessile. The polyp was removed with a hot snare. Resection and retrieval       were complete. To prevent bleeding after the polypectomy, one hemostatic  clip was successfully placed. There was no bleeding during, or at the       end, of the procedure.      A  small polyp was found in the transverse colon. The polyp was sessile.       The polyp was removed with a hot snare. Resection and retrieval were       complete. To prevent bleeding after the polypectomy, one hemostatic clip       was successfully placed. There was no bleeding during, or at the end, of       the procedure.      A small polyp was found in the transverse colon. The polyp was sessile.       The polyp was removed with a hot snare. Resection and retrieval were       complete.      A diminutive polyp was found in the transverse colon. The polyp was       sessile. The polyp was removed with a jumbo cold forceps. Resection and       retrieval were complete.      A diminutive polyp was found in the rectum. The polyp was sessile. The       polyp was removed with a jumbo cold forceps. Resection and retrieval       were complete.      Many small-mouthed diverticula were found in the sigmoid colon and       distal sigmoid colon.      Internal hemorrhoids were found during endoscopy. The hemorrhoids were       small and Grade I (internal hemorrhoids that do not prolapse).      The exam was otherwise without abnormality. Impression:           - One small polyp in the proximal ascending colon,                        removed with a hot snare. Resected and retrieved. Clip                        was placed.                       - One small polyp in the transverse colon, removed with                        a hot snare. Resected and retrieved. Clip was placed.                       - One small polyp in the transverse colon, removed with                        a hot snare. Resected and retrieved.                       - One diminutive polyp in the transverse colon, removed                        with a jumbo cold forceps. Resected and retrieved.                       - One diminutive polyp in the rectum, removed with a  jumbo cold forceps. Resected and retrieved.                        - Diverticulosis in the sigmoid colon and in the distal                        sigmoid colon.                       - Internal hemorrhoids.                       - The examination was otherwise normal. Recommendation:       - Await pathology results. Manya Silvas, MD 02/21/2016 1:56:24 PM This report has been signed electronically. Number of Addenda: 0 Note Initiated On: 02/21/2016 1:19 PM Scope Withdrawal Time: 0 hours 1 minute 45 seconds  Total Procedure Duration: 0 hours 8 minutes 52 seconds       [facility]

## 2016-02-21 NOTE — Transfer of Care (Signed)
Immediate Anesthesia Transfer of Care Note  Patient: Doris Barnes  Procedure(s) Performed: Procedure(s): COLONOSCOPY WITH PROPOFOL (N/A)  Patient Location: PACU and Endoscopy Unit  Anesthesia Type:General  Level of Consciousness: awake, oriented and patient cooperative  Airway & Oxygen Therapy: Patient Spontanous Breathing and Patient connected to nasal cannula oxygen  Post-op Assessment: Report given to RN and Post -op Vital signs reviewed and stable  Post vital signs: Reviewed and stable  Last Vitals:  Vitals:   02/21/16 1217  BP: (!) 146/48  Pulse: (!) 47  Resp: 16  Temp: 36.1 C    Last Pain:  Vitals:   02/21/16 1217  TempSrc: Tympanic         Complications: No apparent anesthesia complications

## 2016-02-21 NOTE — H&P (Signed)
Primary Care Physician:  Rusty Aus, MD Primary Gastroenterologist:  Dr. Vira Agar  Pre-Procedure History & Physical: HPI:  Doris Barnes is a 75 y.o. female is here for an colonoscopy.   Past Medical History:  Diagnosis Date  . Anemia   . Anxiety   . Asthma   . Cancer ([place]) skin  . Chronic vulvitis   . Colon polyp   . Cystocele   . Diverticulitis   . Diverticulitis   . Diverticulosis   . Diverticulosis   . Dyspareunia, female   . Fibrocystic disease of both breasts   . Gastritis   . GERD (gastroesophageal reflux disease)    gastritis also  . Hemorrhoids   . History of hiatal hernia   . Hypercholesteremia   . Hypertension   . Hypothyroid   . IBS (irritable bowel syndrome)   . IC (interstitial cystitis)   . Migraine   . Mitral valve prolapse   . OP (osteoporosis)   . Positional vertigo   . Rectocele   . Squamous cell carcinoma   . Stroke [facility])    TIA  . Vaginal atrophy     Past Surgical History:  Procedure Laterality Date  . APPENDECTOMY    . BREAST BIOPSY Left 1998   core bx- neg  . BREAST BIOPSY Left 2007   neg  . CARDIAC CATHETERIZATION N/A 01/18/2015   Procedure: Left Heart Cath and Coronary Angiography;  Surgeon: Teodoro Spray, MD;  Location: [facility];  Service: Cardiovascular;  Laterality: N/A;  . CATARACT EXTRACTION W/PHACO Right 12/15/2015   Procedure: CATARACT EXTRACTION PHACO AND INTRAOCULAR LENS PLACEMENT (I[facility]);  Surgeon: Estill Cotta, MD;  Location: [facility] ORS;  Service: Ophthalmology;  Laterality: Right;  [place] 01:20 AP% 23.9 CDE 35.49 Fluid pack lot # [id number]  . CATARACT EXTRACTION W/PHACO Left 12/29/2015   Procedure: CATARACT EXTRACTION PHACO AND INTRAOCULAR LENS PLACEMENT ([place]);  Surgeon: Estill Cotta, MD;  Location: [facility] ORS;  Service: Ophthalmology;  Laterality: Left;  [place]  01:20 AP% 25.1 CDE 32.86 Fluid pack lot # [id number]  . COLON SURGERY    . CORONARY ANGIOPLASTY    . DILATION AND CURETTAGE OF UTERUS    .  OOPHORECTOMY Bilateral   . TONSILLECTOMY    . VAGINAL HYSTERECTOMY      Prior to Admission medications   Medication Sig Start Date End Date Taking? Authorizing Provider  ALPRAZolam (XANAX) 0.25 MG tablet 0.25 mg 2 (two) times daily.  11/17/14  Yes Historical Provider, MD  aspirin EC 325 MG tablet Take 325 mg by mouth daily.    Yes Historical Provider, MD  atenolol (TENORMIN) 50 MG tablet Take 50 mg by mouth daily.  07/24/14  Yes Historical Provider, MD  calcium citrate-vitamin D (CITRACAL+D) 315-200 MG-UNIT tablet Take 1 tablet by mouth 2 (two) times daily.   Yes Historical Provider, MD  FLUoxetine (PROZAC) 20 MG capsule Take 20 mg by mouth at bedtime.  07/24/14 02/21/16 Yes Historical Provider, MD  hydrochlorothiazide (HYDRODIURIL) 25 MG tablet Take 12.5 mg by mouth daily.  07/24/14  Yes Historical Provider, MD  lansoprazole (PREVACID) 30 MG capsule Take 30 mg by mouth daily.  07/24/14  Yes Historical Provider, MD  loratadine (CLARITIN) 10 MG tablet Take 10 mg by mouth daily.  03/19/14  Yes Historical Provider, MD  Multiple Vitamins-Minerals (PRESERVISION AREDS 2) CAPS Take 2 capsules by mouth daily.   Yes Historical Provider, MD  simvastatin (ZOCOR) 20 MG tablet Take 20 mg by mouth daily at  6 PM.  07/24/14  Yes Historical Provider, MD  telmisartan (MICARDIS) 40 MG tablet Take 40 mg by mouth daily.   Yes Historical Provider, MD  acetaminophen (TYLENOL) 325 MG tablet Take 650 mg by mouth every 6 (six) hours as needed for mild pain.    Historical Provider, MD  ALPRAZolam Duanne Moron) 0.5 MG tablet Take 1 tablet (0.5 mg total) by mouth 3 (three) times daily as needed for sleep or anxiety. 12/18/15 12/17/16  Earleen Newport, MD  Difluprednate (DUREZOL) 0.05 % EMUL Place 1 drop into the right eye 2 (two) times daily. Breakfast, bedtime     Historical Provider, MD  doxazosin (CARDURA) 2 MG tablet Take 2 mg by mouth daily.    Historical Provider, MD  fluticasone (FLONASE) 50 MCG/ACT nasal spray Place 2 sprays  into both nostrils daily as needed.     Historical Provider, MD  LORazepam (ATIVAN) 0.5 MG tablet Take 0.5 mg by mouth every 8 (eight) hours.    Historical Provider, MD  moxifloxacin (VIGAMOX) 0.5 % ophthalmic solution Place 1 drop into the right eye 4 (four) times daily. Breakfast,lunch,supper,bedtime     Historical Provider, MD  nepafenac (ILEVRO) 0.3 % ophthalmic suspension 1 drop daily. bedtime    Historical Provider, MD    Allergies as of 01/06/2016 - Review Complete 12/29/2015  Allergen Reaction Noted  . Epinephrine  12/13/2015  . Imipramine pamoate  12/13/2015  . Sulfa antibiotics Rash 09/17/2014    Family History  Problem Relation Age of Onset  . Diabetes Mother   . Diabetes Father   . Colon cancer Sister   . Diabetes Sister   . Diabetes Maternal Aunt   . Colon cancer Paternal Grandfather   . Breast cancer Neg Hx   . Ovarian cancer Neg Hx     Social History   Social History  . Marital status: Married    Spouse name: N/A  . Number of children: N/A  . Years of education: N/A   Occupational History  . Not on file.   Social History Main Topics  . Smoking status: Former Smoker    Packs/day: 1.00    Years: 33.00    Quit date: 10/17/1987  . Smokeless tobacco: Never Used  . Alcohol use No  . Drug use: No  . Sexual activity: Yes   Other Topics Concern  . Not on file   Social History Narrative  . No narrative on file    Review of Systems: See HPI, otherwise negative ROS  Physical Exam: BP (!) 146/48   Pulse (!) 47   Temp 97 F (36.1 C) (Tympanic)   Resp 16   Ht 5\' 2"  (1.575 m)   Wt 54 kg (119 lb)   SpO2 98%   BMI 21.77 kg/m  General:   Alert,  pleasant and cooperative in NAD Head:  Normocephalic and atraumatic. Neck:  Supple; no masses or thyromegaly. Lungs:  Clear throughout to auscultation.    Heart:  Regular rate and rhythm. Abdomen:  Soft, nontender and nondistended. Normal bowel sounds, without guarding, and without rebound.   Neurologic:   Alert and  oriented x4;  grossly normal neurologically.  Impression/Plan: Doris Barnes is here for an colonoscopy to be performed for [facility] colon polyps  Risks, benefits, limitations, and alternatives regarding  colonoscopy have been reviewed with the patient.  Questions have been answered.  All parties agreeable.   Gaylyn Cheers, MD  02/21/2016, 1:18 PM

## 2016-02-21 NOTE — Anesthesia Preprocedure Evaluation (Signed)
Anesthesia Evaluation  Patient identified by MRN, date of birth, ID band Patient awake    Reviewed: Allergy & Precautions, NPO status , Patient's Chart, lab work & pertinent test results  History of Anesthesia Complications Negative for: history of anesthetic complications  Airway Mallampati: II       Dental  (+) Upper Dentures   Pulmonary asthma , former smoker,           Cardiovascular hypertension, Pt. on medications      Neuro/Psych Anxiety TIA (dizziness)   GI/Hepatic Neg liver ROS, hiatal hernia, GERD  Medicated,  Endo/Other  negative endocrine ROSHypothyroidism   Renal/GU negative Renal ROS     Musculoskeletal  (+) Arthritis ,   Abdominal   Peds  Hematology  (+) anemia ,   Anesthesia Other Findings   Reproductive/Obstetrics                            Anesthesia Physical Anesthesia Plan  ASA: II  Anesthesia Plan: General   Post-op Pain Management:    Induction: Intravenous  Airway Management Planned: Nasal Cannula  Additional Equipment:   Intra-op Plan:   Post-operative Plan:   Informed Consent: I have reviewed the patients History and Physical, chart, labs and discussed the procedure including the risks, benefits and alternatives for the proposed anesthesia with the patient or authorized representative who has indicated his/her understanding and acceptance.     Plan Discussed with:   Anesthesia Plan Comments:         Anesthesia Quick Evaluation

## 2016-02-21 NOTE — Anesthesia Postprocedure Evaluation (Signed)
Anesthesia Post Note  Patient: Doris Barnes  Procedure(s) Performed: Procedure(s) (LRB): COLONOSCOPY WITH PROPOFOL (N/A)  Patient location during evaluation: Endoscopy Anesthesia Type: General Level of consciousness: awake and alert Pain management: pain level controlled Vital Signs Assessment: post-procedure vital signs reviewed and stable Respiratory status: spontaneous breathing and respiratory function stable Cardiovascular status: stable Anesthetic complications: no    Last Vitals:  Vitals:   02/21/16 1358 02/21/16 1400  BP: (!) 105/50 (!) 106/50  Pulse: 72 73  Resp: 16 14  Temp: (!) 35.9 C (!) 35.8 C    Last Pain:  Vitals:   02/21/16 1400  TempSrc: Tympanic                 KEPHART,WILLIAM K

## 2016-02-22 ENCOUNTER — Encounter (INDEPENDENT_AMBULATORY_CARE_PROVIDER_SITE_OTHER): Payer: Self-pay | Admitting: Vascular Surgery

## 2016-02-22 ENCOUNTER — Ambulatory Visit (INDEPENDENT_AMBULATORY_CARE_PROVIDER_SITE_OTHER): Payer: Commercial Managed Care - HMO | Admitting: Vascular Surgery

## 2016-02-22 VITALS — BP 139/67 | HR 72 | Resp 16 | Ht 62.0 in | Wt 126.0 lb

## 2016-02-22 DIAGNOSIS — K559 Vascular disorder of intestine, unspecified: Secondary | ICD-10-CM | POA: Insufficient documentation

## 2016-02-22 DIAGNOSIS — I10 Essential (primary) hypertension: Secondary | ICD-10-CM

## 2016-02-22 DIAGNOSIS — E785 Hyperlipidemia, unspecified: Secondary | ICD-10-CM

## 2016-02-22 DIAGNOSIS — G459 Transient cerebral ischemic attack, unspecified: Secondary | ICD-10-CM | POA: Diagnosis not present

## 2016-02-22 LAB — SURGICAL PATHOLOGY

## 2016-02-22 NOTE — Progress Notes (Signed)
Patient ID: Doris Barnes, female   DOB: 1940-05-10, 75 y.o.   MRN: [id number]  Chief Complaint  Patient presents with  . New Patient (Initial Visit)    HPI Doris Barnes is a 75 y.o. female.  I am asked to see the patient by Dr. Vira Agar for evaluation of ischemic colitis.  The patient reports admission several months ago while she was in [place] for severe abdominal pain. She reports having problems with constipation and taking MiraLAX and an enema and then developing some of the worst pain she has ever had. She says the pain was very severe and debilitating. She was in the hospital for 3-4 days and received IV antibiotics which improved her situation. She was discharged home on oral antibiotics and did well. Since that time, she has had a colonoscopy with polypectomy but no recurrent ischemic symptoms. She does have multiple atherosclerotic risk factors including hypertension and hyperlipidemia. She had a previous CT scan of the chest which goes down and shows a patent celiac artery, but the SMA and IMA are not evaluated in any detail.   Past Medical History:  Diagnosis Date  . Anemia   . Anxiety   . Asthma   . Cancer ([place]) skin  . Chronic vulvitis   . Colon polyp   . Cystocele   . Diverticulitis   . Diverticulitis   . Diverticulosis   . Diverticulosis   . Dyspareunia, female   . Fibrocystic disease of both breasts   . Gastritis   . GERD (gastroesophageal reflux disease)    gastritis also  . Hemorrhoids   . History of hiatal hernia   . Hypercholesteremia   . Hypertension   . Hypothyroid   . IBS (irritable bowel syndrome)   . IC (interstitial cystitis)   . Migraine   . Mitral valve prolapse   . OP (osteoporosis)   . Positional vertigo   . Rectocele   . Squamous cell carcinoma   . Stroke [facility])    TIA  . Vaginal atrophy     Past Surgical History:  Procedure Laterality Date  . APPENDECTOMY    . BREAST BIOPSY Left 1998   core bx- neg  . BREAST BIOPSY Left 2007    neg  . CARDIAC CATHETERIZATION N/A 01/18/2015   Procedure: Left Heart Cath and Coronary Angiography;  Surgeon: Teodoro Spray, MD;  Location: [place] [facility];  Service: Cardiovascular;  Laterality: N/A;  . CATARACT EXTRACTION W/PHACO Right 12/15/2015   Procedure: CATARACT EXTRACTION PHACO AND INTRAOCULAR LENS PLACEMENT (IOC);  Surgeon: Estill Cotta, MD;  Location: [facility] ORS;  Service: Ophthalmology;  Laterality: Right;  [place] 01:20 AP% 23.9 CDE 35.49 Fluid pack lot # [id number]  . CATARACT EXTRACTION W/PHACO Left 12/29/2015   Procedure: CATARACT EXTRACTION PHACO AND INTRAOCULAR LENS PLACEMENT ([place]);  Surgeon: Estill Cotta, MD;  Location: [facility] ORS;  Service: Ophthalmology;  Laterality: Left;  [place]  01:20 AP% 25.1 CDE 32.86 Fluid pack lot # [id number]  . COLON SURGERY    . CORONARY ANGIOPLASTY    . DILATION AND CURETTAGE OF UTERUS    . OOPHORECTOMY Bilateral   . TONSILLECTOMY    . VAGINAL HYSTERECTOMY      Family History  Problem Relation Age of Onset  . Diabetes Mother   . Diabetes Father   . Colon cancer Sister   . Diabetes Sister   . Diabetes Maternal Aunt   . Colon cancer Paternal Grandfather   . Breast cancer Neg Hx   .  Ovarian cancer Neg Hx     Social History Social History  Substance Use Topics  . Smoking status: Former Smoker    Packs/day: 1.00    Years: 33.00    Quit date: 10/17/1987  . Smokeless tobacco: Never Used  . Alcohol use No   No IV drug use  Allergies  Allergen Reactions  . Epinephrine   . Imipramine Pamoate   . Sulfa Antibiotics Rash    Face turns red and stings    Current Outpatient Prescriptions  Medication Sig Dispense Refill  . acetaminophen (TYLENOL) 325 MG tablet Take 650 mg by mouth every 6 (six) hours as needed for mild pain.    Marland Kitchen ALPRAZolam (XANAX) 0.25 MG tablet 0.25 mg 2 (two) times daily.     Mar[place] Kitchen aspirin EC 325 MG tablet Take 325 mg by mouth daily.     Marland Kitchen atenolol (TENORMIN) 50 MG tablet Take 50 mg by mouth daily.     .  calcium citrate-vitamin D (CITRACAL+D) 315-200 MG-UNIT tablet Take 1 tablet by mouth 2 (two) times daily.    . fluticasone (FLONASE) 50 MCG/ACT nasal spray Place 2 sprays into both nostrils daily as needed.     . hydrochlorothiazide (HYDRODIURIL) 25 MG tablet Take 12.5 mg by mouth daily.     . lansoprazole (PREVACID) 30 MG capsule Take 30 mg by mouth daily.     Marland Kitchen loratadine (CLARITIN) 10 MG tablet Take 10 mg by mouth daily.     Marland Kitchen moxifloxacin (VIGAMOX) 0.5 % ophthalmic solution Place 1 drop into the right eye 4 (four) times daily. Breakfast,lunch,supper,bedtime     . Multiple Vitamins-Minerals (PRESERVISION AREDS 2) CAPS Take 2 capsules by mouth daily.    . simvastatin (ZOCOR) 20 MG tablet Take 20 mg by mouth daily at 6 PM.     . telmisartan (MICARDIS) 40 MG tablet Take 40 mg by mouth daily.    Marland Kitchen ALPRAZolam (XANAX) 0.5 MG tablet Take 1 tablet (0.5 mg total) by mouth 3 (three) times daily as needed for sleep or anxiety. (Patient not taking: Reported on 02/22/2016) 30 tablet 0  . Difluprednate (DUREZOL) 0.05 % EMUL Place 1 drop into the right eye 2 (two) times daily. Breakfast, bedtime     . doxazosin (CARDURA) 2 MG tablet Take 2 mg by mouth daily.    Marland Kitchen FLUoxetine (PROZAC) 20 MG capsule Take 20 mg by mouth at bedtime.     Marland Kitchen LORazepam (ATIVAN) 0.5 MG tablet Take 0.5 mg by mouth every 8 (eight) hours.    . nepafenac (ILEVRO) 0.3 % ophthalmic suspension 1 drop daily. bedtime     No current facility-administered medications for this visit.       REVIEW OF SYSTEMS (Negative unless checked)  Constitutional: '[]' Weight loss  '[]' Fever  '[]' Chills Cardiac: '[]' Chest pain   '[]' Chest pressure   '[]' Palpitations   '[]' Shortness of breath when laying flat   '[]' Shortness of breath at rest   '[]' Shortness of breath with exertion. Vascular:  '[]' Pain in legs with walking   '[]' Pain in legs at rest   '[]' Pain in legs when laying flat   '[]' Claudication   '[]' Pain in feet when walking  '[]' Pain in feet at rest  '[]' Pain in feet when laying  flat   '[]' History of DVT   '[]' Phlebitis   '[]' Swelling in legs   '[]' Varicose veins   '[]' Non-healing ulcers Pulmonary:   '[]' Uses home oxygen   '[]' Productive cough   '[]' Hemoptysis   '[]' Wheeze  '[]' COPD   '[]' Asthma Neurologic:  '[]' Dizziness  '[]'   Blackouts   '[]' Seizures   '[]' History of stroke   '[x]' History of TIA  '[]' Aphasia   '[]' Temporary blindness   '[]' Dysphagia   '[]' Weakness or numbness in arms   '[]' Weakness or numbness in legs Musculoskeletal:  '[x]' Arthritis   '[]' Joint swelling   '[]' Joint pain   '[]' Low back pain Hematologic:  '[]' Easy bruising  '[]' Easy bleeding   '[]' Hypercoagulable state   '[]' Anemic  '[]' Hepatitis Gastrointestinal:  '[]' Blood in stool   '[]' Vomiting blood  '[]' Gastroesophageal reflux/heartburn   '[x]' Abdominal pain Genitourinary:  '[]' Chronic kidney disease   '[]' Difficult urination  '[]' Frequent urination  '[]' Burning with urination   '[]' Hematuria Skin:  '[]' Rashes   '[]' Ulcers   '[]' Wounds Psychological:  '[]' History of anxiety   '[]'  History of major depression.    Physical Exam BP 139/67   Pulse 72   Resp 16   Ht '5\' 2"'  (1.575 m)   Wt 126 lb (57.2 kg)   BMI 23.05 kg/m  Gen:  WD/WN, NAD Head: [place]/AT, No temporalis wasting. Prominent temp pulse not noted. Ear/Nose/Throat: Hearing grossly intact, nares w/o erythema or drainage, oropharynx w/o Erythema/Exudate Eyes: Conjunctiva clear, sclera non-icteric  Neck: trachea midline.  No bruit or JVD.  Pulmonary:  Good air movement, clear to auscultation bilaterally.  Cardiac: RRR, normal S1, S2, no Murmurs, rubs or gallops. Vascular:  Vessel Right Left  Radial Palpable Palpable  Ulnar Palpable Palpable  Brachial Palpable Palpable  Carotid Palpable, without bruit Palpable, without bruit  Aorta Not palpable N/A  Femoral Palpable Palpable  Popliteal Palpable Palpable  PT Palpable Palpable  DP Palpable 1+ Palpable   Gastrointestinal: soft, non-tender/non-distended. No guarding/reflex. No masses, surgical incisions, or scars. Musculoskeletal: M/S 5/5 throughout.  Extremities without  ischemic changes.  No deformity or atrophy. No edema. Neurologic: Sensation grossly intact in extremities.  Symmetrical.  Speech is fluent. Motor exam as listed above. Psychiatric: Judgment intact, Mood & affect appropriate for pt's clinical situation. Dermatologic: No rashes or ulcers noted.  No cellulitis or open wounds. Lymph : No Cervical, Axillary, or Inguinal lymphadenopathy.   Radiology No results found.  Labs Recent Results (from the past 2160 hour(s))  Basic metabolic panel     Status: Abnormal   Collection Time: 12/18/15 11:22 AM  Result Value Ref Range   Sodium 134 (L) 135 - 145 mmol/L   Potassium 3.4 (L) 3.5 - 5.1 mmol/L   Chloride 97 (L) 101 - 111 mmol/L   CO2 31 22 - 32 mmol/L   Glucose, Bld 150 (H) 65 - 99 mg/dL   BUN 10 6 - 20 mg/dL   Creatinine, Ser 0.76 0.44 - 1.00 mg/dL   Calcium 9.6 8.9 - 10.3 mg/dL   GFR calc non Af Amer >60 >60 mL/min   GFR calc Af Amer >60 >60 mL/min    Comment: (NOTE) The eGFR has been calculated using the CKD EPI equation. This calculation has not been validated in all clinical situations. eGFR's persistently <60 mL/min signify possible Chronic Kidney Disease.    Anion gap 6 5 - 15  Troponin I     Status: None   Collection Time: 12/18/15 11:22 AM  Result Value Ref Range   Troponin I <0.03 <0.03 ng/mL  CBC with Differential     Status: Abnormal   Collection Time: 12/18/15 11:22 AM  Result Value Ref Range   WBC 7.2 3.6 - 11.0 K/uL   RBC 4.96 3.80 - 5.20 MIL/uL   Hemoglobin 14.9 12.0 - 16.0 g/dL   HCT 43.0 35.0 - 47.0 %   MCV  86.7 80.0 - 100.0 fL   MCH 30.0 26.0 - 34.0 pg   MCHC 34.6 32.0 - 36.0 g/dL   RDW 14.9 (H) 11.5 - 14.5 %   Platelets 211 150 - 440 K/uL   Neutrophils Relative % 71 %   Neutro Abs 5.2 1.4 - 6.5 K/uL   Lymphocytes Relative 17 %   Lymphs Abs 1.2 1.0 - 3.6 K/uL   Monocytes Relative 8 %   Monocytes Absolute 0.6 0.2 - 0.9 K/uL   Eosinophils Relative 4 %   Eosinophils Absolute 0.3 0 - 0.7 K/uL   Basophils  Relative 0 %   Basophils Absolute 0.0 0 - 0.1 K/uL  Urinalysis complete, with microscopic     Status: Abnormal   Collection Time: 12/18/15  1:54 PM  Result Value Ref Range   Color, Urine STRAW (A) YELLOW   APPearance CLEAR (A) CLEAR   Glucose, UA NEGATIVE NEGATIVE mg/dL   Bilirubin Urine NEGATIVE NEGATIVE   Ketones, ur NEGATIVE NEGATIVE mg/dL   Specific Gravity, Urine 1.003 (L) 1.005 - 1.030   Hgb urine dipstick NEGATIVE NEGATIVE   pH 6.0 5.0 - 8.0   Protein, ur NEGATIVE NEGATIVE mg/dL   Nitrite NEGATIVE NEGATIVE   Leukocytes, UA NEGATIVE NEGATIVE   RBC / HPF NONE SEEN 0 - 5 RBC/hpf   WBC, UA 0-5 0 - 5 WBC/hpf   Bacteria, UA NONE SEEN NONE SEEN   Squamous Epithelial / LPF 0-5 (A) NONE SEEN  Troponin I     Status: None   Collection Time: 12/18/15  3:53 PM  Result Value Ref Range   Troponin I <0.03 <0.03 ng/mL  Surgical pathology     Status: None   Collection Time: 02/21/16  1:35 PM  Result Value Ref Range   SURGICAL PATHOLOGY      Surgical Pathology CASE: [id number] PATIENT: Kalanie Giaimo Surgical Pathology Report     SPECIMEN SUBMITTED: A. Colon polyp, proximal ascending; hot snare B. Colon polyp x2, transverse; hot snare C. Colon polyp, transverse; cbx D. Rectum polyp; cbx  CLINICAL HISTORY: None provided  PRE-OPERATIVE DIAGNOSIS: P HX colon polyp  POST-OPERATIVE DIAGNOSIS: Colon polyps, diverticulosis, hemorrhoids     DIAGNOSIS: A. COLON POLYP, PROXIMAL ASCENDING; HOT SNARE: - SESSILE SERRATED ADENOMA WITH LOW-GRADE CYTOLOGIC AND ARCHITECTURAL DYSPLASIA. - NEGATIVE FOR HIGH-GRADE DYSPLASIA AND MALIGNANCY.  B. COLON POLYP 2, TRANSVERSE; HOT SNARE: - TUBULAR ADENOMA (MULTIPLE FRAGMENTS). - NEGATIVE FOR HIGH-GRADE DYSPLASIA AND MALIGNANCY.  C. COLON POLYP, TRANSVERSE; COLD BIOPSY: - HYPERPLASTIC POLYP. - NEGATIVE FOR DYSPLASIA AND MALIGNANCY.  D. RECTUM POLYP; COLD BIOPSY: - HYPERPLASTIC POLYP. - NEGATIVE FOR DYSPLASIA AND  MALIGNANCY.  Comment: Sessile serrated ad enomas (SSAs) are often difficult to detect endoscopically as they are typically broad flat lesions found in the proximal colon. By morphologic definition, SSAs demonstrate architectural (low grade) dysplasia, with or without concurrent cytologic dysplasia. SSAs are thought to be precursor lesions to a subset of colonic adenocarcinomas that arise through the serrated neoplastic pathway rather than the classical neoplasia pathway. Lesions of the serrated pathway have a high frequency of BRAF mutation and are more likely to be microsatellite unstable compared to tubular / villous adenomas of the classical pathway.  The recommended surveillance interval for a SSA <10 mm with no cytologic dysplasia is 5 years.  A SSA ?10 mm and a sessile serrated polyp with cytological dysplasia should be managed like a high risk adenoma (recommended surveillance interval of 3 years).  Serrated polyposis syndrome, as defined by WHO, should  be considered with one of the following criteri a: (1) at least 5 serrated polyps proximal to sigmoid, with 2 or more ?10 mm; (2) any serrated polyps proximal to sigmoid with family history of serrated polyposis syndrome; and (3) >20 serrated polyps of any size throughout the colon.  References: Guidelines for Colonoscopy Surveillance After Screening and Polypectomy: A Consensus Update by the [place] Multi-Society Task Force on Colorectal Cancer. [place] [facility] [employer], 2012.    GROSS DESCRIPTION:  A. Labeled: proximal ascending colon polyp hot snare  Tissue fragment(s): multiple  Size: aggregate, 0.8 x 0.5 x 0.1 cm  Description: tan fragments and fecal material  Entirely submitted in 1 cassette(s).   B. Labeled: transverse colon polyp hot snare 2  Tissue fragment(s): multiple  Size: aggregate, 0.9 x 0.3 x 0.1 cm  Description:  tan fragments  Entirely submitted in 1 cassette(s).  C. Labeled:  transverse colon polyp C BX  Tissue fragment(s): 3  Size: less than 0.1-0.4 cm  Description : pink-tan  Entirely submitted in 1 cassette(s).  D. Labeled: rectal polyp C BX  Tissue fragment(s): 1  Size: 0.25 cm  Description: pink-tan  Entirely submitted in 1 cassette(s).    Final Diagnosis performed by Quay Burow, MD.  Electronically signed 02/22/2016 10:13:52AM    The electronic signature indicates that the named Attending Pathologist has evaluated the specimen  Technical component performed at [facility], [address] [place], [place] [address] Lab: [phone] Dir: Darrick Penna. Evette Doffing, MD  Professional component performed at [facility], [facility], [place], [place], [place] [address] Lab: [phone] Dir: Dellia Nims. Rubinas, MD      Assessment/Plan:  HLD (hyperlipidemia) lipid control important in reducing the progression of atherosclerotic disease. Continue statin therapy   Essential hypertension blood pressure control important in reducing the progression of atherosclerotic disease. On appropriate oral medications.   TIA (transient ischemic attack) Remote. No recent symptoms  Ischemic colitis [facility]) The patient has a history of ischemic colitis that required hospitalization and sounded reasonably severe. We had a long discussion today regarding the natural history and pathophysiology of ischemic colitis. We discussed that most patients with ischemic colitis have small vessel disease that is not amenable to endovascular revascularization, but a not insignificant majority have large vessel disease of the superior mesenteric artery or inferior mesenteric artery that puts them at risk for subsequent episodes. Revascularization in these instances is generally indicated to avoid recurrence. I have recommended further evaluation of her visceral vessels with mesenteric duplex. This will be performed at her convenience. We will see her back  following this study to discuss the results and determine further treatment options.      Leotis Pain 02/22/2016, 10:58 AM   This note was created with Dragon medical transcription system.  Any errors from dictation are unintentional.

## 2016-02-22 NOTE — Assessment & Plan Note (Signed)
lipid control important in reducing the progression of atherosclerotic disease. Continue statin therapy  

## 2016-02-22 NOTE — Assessment & Plan Note (Signed)
The patient has a history of ischemic colitis that required hospitalization and sounded reasonably severe. We had a long discussion today regarding the natural history and pathophysiology of ischemic colitis. We discussed that most patients with ischemic colitis have small vessel disease that is not amenable to endovascular revascularization, but a not insignificant majority have large vessel disease of the superior mesenteric artery or inferior mesenteric artery that puts them at risk for subsequent episodes. Revascularization in these instances is generally indicated to avoid recurrence. I have recommended further evaluation of Doris Barnes visceral vessels with mesenteric duplex. This will be performed at Doris Barnes convenience. We will see Doris Barnes back following this study to discuss the results and determine further treatment options.

## 2016-02-22 NOTE — Patient Instructions (Signed)
Chronic Mesenteric Ischemia Mesenteric ischemia is a deficiency of blood in an area of the intestine supplied by an artery that supports the intestine. Chronic mesenteric ischemia, also called intestinal angina, is a long-term condition. It happens when an artery or vein that supports the intestine gradually becomes blocked or narrow, restricting the blood supply to the intestine. When the blood supply to the intestine is severely restricted, the intestines cannot function properly because needed oxygen cannot reach them.  CAUSES   Fatty deposits that build up in an artery or vein but have not yet restricted blood flow entirely.  Differences in some people's anatomy.  Rapid weight loss.  Weakened areas in blood vessel walls (aneurysms).  Swelling and inflammation of blood vessels (such as from fibromuscular dysplasia and arteritis).  Disorders of blood clotting.  Scarring and fibrosis of blood vessels after radiation therapy.  Blood vessel problems after drug use, such as use of cocaine. RISK FACTORS  Being female.  Being over age 50 with a history of coronary or vascular disease.  Smoking.  Congestive heart failure.  Diabetes.  High cholesterol.  High blood pressure (hypertension). SIGNS AND SYMPTOMS   Severe stomachache. Some people become fearful of eating because of pain.   Abdominal pain or cramps that develop about 30 minutes after a meal.   Abdominal pain after eating that becomes worse over time.   Diarrhea.   Nausea.   Vomiting.   Bloating.   Weight loss. DIAGNOSIS  Chronic mesenteric ischemia is often diagnosed after the person's history is taken, a physical exam is done, and tests are taken. Tests may include:  Ultrasounds.  CT scans.  Angiography. This is an imaging test that uses a dye to obtain a picture of blood flow to the intestine.  Endoscopy. This involves putting a scope through the mouth, down the throat, and into the stomach and  intestine to view the intestinal wall and take small tissue samples (biopsies).  Tonometry. In this test a tiny probe is passed through the mouth and into the stomach or intestine and left in place for 24 hours or more. It measures the output of carbon dioxide by the affected tissues. TREATMENT  Treatment may include:   Medicines to reduce blood clotting and increase blood flow.   Surgery to remove the blockage, repair arteries or veins, and restore blood flow. This may involve:   Angioplasty. This is surgery to widen the affected artery, reduce the blockage, and sometimes insert a small, mesh tube (stent).   Bypass surgery. This may be performed to bypass the blockage and reconnect healthy arteries or veins.   A stent in the affected area to help keep blocked arteries open. HOME CARE INSTRUCTIONS  Only take over-the-counter or prescription medicines as directed by your health care provider.   Keep all follow-up appointments as directed by your health care provider.   Prevent the condition from occurring by:  Doing regular exercise.  Keeping a healthy weight.  Keeping a healthy diet.  Managing cholesterol levels.  Keeping blood pressure and heart rhythm problems under control.  Not smoking. SEEK IMMEDIATE MEDICAL CARE IF:  You have severe abdominal pain.   You notice blood in your stool.   You have nausea, vomiting, or diarrhea.   You have a fever. MAKE SURE YOU:  Understand these instructions.  Will watch your condition.  Will get help right away if you are not doing well or get worse. This information is not intended to replace advice given to   you by your health care provider. Make sure you discuss any questions you have with your health care provider. Document Released: 10/31/2010 Document Revised: 11/13/2012 Document Reviewed: 09/11/2012 Elsevier Interactive Patient Education  2017 [employer]  

## 2016-02-22 NOTE — Assessment & Plan Note (Signed)
Remote. No recent symptoms. 

## 2016-02-22 NOTE — Assessment & Plan Note (Signed)
blood pressure control important in reducing the progression of atherosclerotic disease. On appropriate oral medications.  

## 2016-03-02 DIAGNOSIS — R42 Dizziness and giddiness: Secondary | ICD-10-CM | POA: Diagnosis not present

## 2016-03-22 ENCOUNTER — Ambulatory Visit (INDEPENDENT_AMBULATORY_CARE_PROVIDER_SITE_OTHER): Payer: Commercial Managed Care - HMO

## 2016-03-22 DIAGNOSIS — K559 Vascular disorder of intestine, unspecified: Secondary | ICD-10-CM

## 2016-03-29 ENCOUNTER — Telehealth (INDEPENDENT_AMBULATORY_CARE_PROVIDER_SITE_OTHER): Payer: Self-pay | Admitting: Vascular Surgery

## 2016-03-29 NOTE — Telephone Encounter (Signed)
Patient called by Dr. Lucky Cowboy. Mesenteric results given. All questions answered. Patient is to follow up PRN.

## 2016-04-04 DIAGNOSIS — Z961 Presence of intraocular lens: Secondary | ICD-10-CM | POA: Diagnosis not present

## 2016-05-01 DIAGNOSIS — L820 Inflamed seborrheic keratosis: Secondary | ICD-10-CM | POA: Diagnosis not present

## 2016-05-01 DIAGNOSIS — D692 Other nonthrombocytopenic purpura: Secondary | ICD-10-CM | POA: Diagnosis not present

## 2016-05-01 DIAGNOSIS — L821 Other seborrheic keratosis: Secondary | ICD-10-CM | POA: Diagnosis not present

## 2016-05-08 ENCOUNTER — Emergency Department
Admission: EM | Admit: 2016-05-08 | Discharge: 2016-05-08 | Disposition: A | Discharge status: Home / Self Care | Payer: Medicare HMO | Attending: Emergency Medicine | Admitting: Emergency Medicine

## 2016-05-08 ENCOUNTER — Encounter: Payer: Self-pay | Admitting: Emergency Medicine

## 2016-05-08 DIAGNOSIS — I10 Essential (primary) hypertension: Secondary | ICD-10-CM | POA: Diagnosis not present

## 2016-05-08 DIAGNOSIS — J45909 Unspecified asthma, uncomplicated: Secondary | ICD-10-CM | POA: Diagnosis not present

## 2016-05-08 DIAGNOSIS — Z87891 Personal history of nicotine dependence: Secondary | ICD-10-CM | POA: Insufficient documentation

## 2016-05-08 DIAGNOSIS — R002 Palpitations: Secondary | ICD-10-CM | POA: Insufficient documentation

## 2016-05-08 DIAGNOSIS — Z7982 Long term (current) use of aspirin: Secondary | ICD-10-CM | POA: Insufficient documentation

## 2016-05-08 DIAGNOSIS — E039 Hypothyroidism, unspecified: Secondary | ICD-10-CM | POA: Insufficient documentation

## 2016-05-08 DIAGNOSIS — Z79899 Other long term (current) drug therapy: Secondary | ICD-10-CM | POA: Insufficient documentation

## 2016-05-08 LAB — CBC
HCT: 38 % (ref 35.0–47.0)
Hemoglobin: 13.2 g/dL (ref 12.0–16.0)
MCH: 30.2 pg (ref 26.0–34.0)
MCHC: 34.9 g/dL (ref 32.0–36.0)
MCV: 86.6 fL (ref 80.0–100.0)
Platelets: 213 10*3/uL (ref 150–440)
RBC: 4.38 MIL/uL (ref 3.80–5.20)
RDW: 12.9 % (ref 11.5–14.5)
WBC: 6.1 10*3/uL (ref 3.6–11.0)

## 2016-05-08 LAB — COMPREHENSIVE METABOLIC PANEL
ALT: 17 U/L (ref 14–54)
AST: 24 U/L (ref 15–41)
Albumin: 3.9 g/dL (ref 3.5–5.0)
Alkaline Phosphatase: 76 U/L (ref 38–126)
Anion gap: 6 (ref 5–15)
BUN: 12 mg/dL (ref 6–20)
CO2: 29 mmol/L (ref 22–32)
Calcium: 9.1 mg/dL (ref 8.9–10.3)
Chloride: 100 mmol/L — ABNORMAL LOW (ref 101–111)
Creatinine, Ser: 0.66 mg/dL (ref 0.44–1.00)
GFR calc Af Amer: >60 mL/min (ref >60)
GFR calc non Af Amer: >60 mL/min (ref >60)
Glucose, Bld: 180 mg/dL — ABNORMAL HIGH (ref 65–99)
Potassium: 4 mmol/L (ref 3.5–5.1)
Sodium: 135 mmol/L (ref 135–145)
Total Bilirubin: 0.5 mg/dL (ref 0.3–1.2)
Total Protein: 7.2 g/dL (ref 6.5–8.1)

## 2016-05-08 LAB — MAGNESIUM: Magnesium: 1.8 mg/dL (ref 1.7–2.4)

## 2016-05-08 LAB — TROPONIN I
Troponin I: <0.03 ng/mL (ref <0.03)
Troponin I: <0.03 ng/mL (ref <0.03)

## 2016-05-08 LAB — TSH: TSH: 1.83 u[IU]/mL (ref 0.350–4.500)

## 2016-05-08 MED ORDER — MAGNESIUM SULFATE 2 GM/50ML IV SOLN
2.0000 g | Freq: Once | INTRAVENOUS | Status: AC
  Administered 2016-05-08: 2 g via INTRAVENOUS
  Filled 2016-05-08: qty 50

## 2016-05-08 NOTE — ED Triage Notes (Signed)
States approx 9am felt like heart beating fast. Checked her pulse and was 100 at that time. Denies chest pain.

## 2016-05-08 NOTE — ED Provider Notes (Signed)
[facility] Emergency Department Provider Note  ____________________________________________  Time seen: Approximately 11:57 AM  I have reviewed the triage vital signs and the nursing notes.   HISTORY  Chief Complaint Palpitations   HPI Doris Barnes is a 76 y.o. female with a history of anemia, anxiety, CAD, mitral prolapse, TIA, hypertension, hypothyroidism who presents for evaluation of palpitations. Patient reports that she's been having these episodes for years. She has worn 24-hour Holter monitors that were uneventful. She is followed by Dr. Ubaldo Glassing. This morning she reports that she was doing her daily activities around the house when she started feeling palpitations. She sat down and noted that her blood pressure was [id number] systolic and her heart rate was 100. She became very anxious and took a Xanax. She reports that that sensation lasted until she arrived into the emergency room. She now feels back to her baseline. She denies chest pain, dizziness, diaphoresis, nausea, vomiting, shortness of breath. She endorses compliance with her medications and took her morning doses. She denies any new recent changes on her medications. She says been on atenolol for many years for tachycardia.  Past Medical History:  Diagnosis Date  . Anemia   . Anxiety   . Asthma   . Cancer ([place]) skin  . Chronic vulvitis   . Colon polyp   . Cystocele   . Diverticulitis   . Diverticulitis   . Diverticulosis   . Diverticulosis   . Dyspareunia, female   . Fibrocystic disease of both breasts   . Gastritis   . GERD (gastroesophageal reflux disease)    gastritis also  . Hemorrhoids   . History of hiatal hernia   . Hypercholesteremia   . Hypertension   . Hypothyroid   . IBS (irritable bowel syndrome)   . IC (interstitial cystitis)   . Migraine   . Mitral valve prolapse   . OP (osteoporosis)   . Positional vertigo   . Rectocele   . Squamous cell carcinoma   . Stroke [facility])     TIA  . Vaginal atrophy     Patient Active Problem List   Diagnosis Date Noted  . TIA (transient ischemic attack) 02/22/2016  . Ischemic colitis ([place]) 02/22/2016  . Psoriasiform dermatitis 01/07/2015  . Bloodgood disease 12/31/2014  . HLD (hyperlipidemia) 12/31/2014  . Headache, migraine 12/31/2014  . Billowing mitral valve 12/31/2014  . Osteoporosis, post-menopausal 12/31/2014  . Squamous cell carcinoma 12/31/2014  . Essential hypertension 09/18/2014  . Degeneration of cervical intervertebral disc 09/18/2014  . Ataxia 09/17/2014  . Adaptive colitis 02/02/2014    Past Surgical History:  Procedure Laterality Date  . APPENDECTOMY    . BREAST BIOPSY Left 1998   core bx- neg  . BREAST BIOPSY Left 2007   neg  . CARDIAC CATHETERIZATION N/A 01/18/2015   Procedure: Left Heart Cath and Coronary Angiography;  Surgeon: Teodoro Spray, MD;  Location: [facility];  Service: Cardiovascular;  Laterality: N/A;  . CATARACT EXTRACTION W/PHACO Right 12/15/2015   Procedure: CATARACT EXTRACTION PHACO AND INTRAOCULAR LENS PLACEMENT (IOC);  Surgeon: Estill Cotta, MD;  Location: [facility] ORS;  Service: Ophthalmology;  Laterality: Right;  [place] 01:20 AP% 23.9 CDE 35.49 Fluid pack lot # [id number]  . CATARACT EXTRACTION W/PHACO Left 12/29/2015   Procedure: CATARACT EXTRACTION PHACO AND INTRAOCULAR LENS PLACEMENT ([place]);  Surgeon: Estill Cotta, MD;  Location: [facility] ORS;  Service: Ophthalmology;  Laterality: Left;  [place]  01:20 AP% 25.1 CDE 32.86 Fluid pack lot #  [id number] [place]    . COLONOSCOPY WITH PROPOFOL N/A 02/21/2016   Procedure: COLONOSCOPY WITH PROPOFOL;  Surgeon: Manya Silvas, MD;  Location[facility] ENDOSCOPY;  Service: Endoscopy;  Laterality: N/A;  . CORONARY ANGIOPLASTY    . DILATION AND CURETTAGE OF UTERUS    . OOPHORECTOMY Bilateral   . TONSILLECTOMY    . VAGINAL HYSTERECTOMY      Prior to Admission medications   Medication Sig Start Date End Date Taking?  Authorizing Provider  acetaminophen (TYLENOL) 325 MG tablet Take 650 mg by mouth every 6 (six) hours as needed for mild pain.   Yes Historical Provider, MD  ALPRAZolam (XANAX) 0.25 MG tablet 0.25 mg 2 (two) times daily.  11/17/14  Yes Historical Provider, MD  aspirin EC 325 MG tablet Take 325 mg by mouth daily.    Yes Historical Provider, MD  atenolol (TENORMIN) 50 MG tablet Take 50 mg by mouth daily.  07/24/14  Yes Historical Provider, MD  calcium citrate-vitamin D (CITRACAL+D) 315-200 MG-UNIT tablet Take 1 tablet by mouth 2 (two) times daily.   Yes Historical Provider, MD  fluticasone (FLONASE) 50 MCG/ACT nasal spray Place 2 sprays into both nostrils daily as needed.    Yes Historical Provider, MD  hydrochlorothiazide (HYDRODIURIL) 25 MG tablet Take 12.5 mg by mouth daily.  07/24/14  Yes Historical Provider, MD  lansoprazole (PREVACID) 30 MG capsule Take 30 mg by mouth daily.  07/24/14  Yes Historical Provider, MD  loratadine (CLARITIN) 10 MG tablet Take 10 mg by mouth daily.  03/19/14  Yes Historical Provider, MD  Multiple Vitamins-Minerals (PRESERVISION AREDS 2) CAPS Take 1 capsule by mouth 2 (two) times daily.    Yes Historical Provider, MD  simvastatin (ZOCOR) 40 MG tablet Take 40 mg by mouth daily at 6 PM.  07/24/14  Yes Historical Provider, MD  telmisartan (MICARDIS) 40 MG tablet Take 40 mg by mouth daily.   Yes Historical Provider, MD  FLUoxetine (PROZAC) 20 MG capsule Take 20 mg by mouth at bedtime.  07/24/14 02/21/16  Historical Provider, MD    Allergies Imipramine pamoate; Epinephrine; and Sulfa antibiotics  Family History  Problem Relation Age of Onset  . Diabetes Mother   . Diabetes Father   . Colon cancer Sister   . Diabetes Sister   . Diabetes Maternal Aunt   . Colon cancer Paternal Grandfather   . Breast cancer Neg Hx   . Ovarian cancer Neg Hx     Social History Social History  Substance Use Topics  . Smoking status: Former Smoker    Packs/day: 1.00    Years: 33.00     Quit date: 10/17/1987  . Smokeless tobacco: Never Used  . Alcohol use No    Review of Systems  Constitutional: Negative for fever. Eyes: Negative for visual changes. ENT: Negative for sore throat. Neck: No neck pain  Cardiovascular: Negative for chest pain. + palpitation Respiratory: Negative for shortness of breath. Gastrointestinal: Negative for abdominal pain, vomiting or diarrhea. Genitourinary: Negative for dysuria. Musculoskeletal: Negative for back pain. Skin: Negative for rash. Neurological: Negative for headaches, weakness or numbness. Psych: No SI or HI  ____________________________________________   PHYSICAL EXAM:  VITAL SIGNS: ED Triage Vitals  Enc Vitals Group     BP 05/08/16 1007 (!) 172/77     Pulse Rate 05/08/16 1007 92     Resp 05/08/16 1007 18     Temp 05/08/16 1007 98.1 F (36.7 C)     Temp Source 05/08/16 1007  Oral     SpO2 05/08/16 1007 98 %     Weight 05/08/16 1008 122 lb (55.3 kg)     Height 05/08/16 1008 5\' 2"  (1.575 m)     Head Circumference --      Peak Flow --      Pain Score --      Pain Loc --      Pain Edu? --      Excl. in [place]? --     Constitutional: Alert and oriented. Well appearing and in no apparent distress. HEENT:      Head: Normocephalic and atraumatic.         Eyes: Conjunctivae are normal. Sclera is non-icteric. EOMI. PERRL      Mouth/Throat: Mucous membranes are moist.       Neck: Supple with no signs of meningismus. Cardiovascular: Regular rate and rhythm. No murmurs, gallops, or rubs. 2+ symmetrical distal pulses are present in all extremities. No JVD. Respiratory: Normal respiratory effort. Lungs are clear to auscultation bilaterally. No wheezes, crackles, or rhonchi.  Gastrointestinal: Soft, non tender, and non distended with positive bowel sounds. No rebound or guarding. Musculoskeletal: Nontender with normal range of motion in all extremities. No edema, cyanosis, or erythema of extremities. Neurologic: Normal speech  and language. Face is symmetric. Moving all extremities. No gross focal neurologic deficits are appreciated. Skin: Skin is warm, dry and intact. No rash noted. Psychiatric: Mood and affect are normal. Speech and behavior are normal.  ____________________________________________   LABS (all labs ordered are listed, but only abnormal results are displayed)  Labs Reviewed  COMPREHENSIVE METABOLIC PANEL - Abnormal; Notable for the following:       Result Value   Chloride 100 (*)    Glucose, Bld 180 (*)    All other components within normal limits  CBC  TROPONIN I  MAGNESIUM  TSH  TROPONIN I   ____________________________________________  EKG  ED ECG REPORT I, Rudene Re, the attending physician, personally viewed and interpreted this ECG.  10:05 AM - EKG performed with inverted arm leads. Repeat ordered  12:11 - NSR, frequent PACs, rate 59, normal intervals, normal axis, LVH, slight ST depressions on inferior and lateral leads which are unchanged from prior. No STE. ____________________________________________  RADIOLOGY  none ____________________________________________   PROCEDURES  Procedure(s) performed: None Procedures Critical Care performed:  None ____________________________________________   INITIAL IMPRESSION / ASSESSMENT AND PLAN / ED COURSE   76 y.o. female with a history of anemia, anxiety, CAD, mitral prolapse, TIA, hypertension, hypothyroidism who presents for evaluation of palpitations. Patient has been having daily episodes for multiple months. The one today was associated with elevated blood pressure which made her concerned. She is now back to her baseline. She does have a frequent PACs on telemetry which she is able to feel. Her EKG shows no changes from her baseline. Will check labs for etiology including anemia, electrolyte abnormalities, thyroid abnormalities. Will monitor on telemetry.  Clinical Course as of May 08 1422  Mon May 08, 2016    1419 Workup negative . No Arrhythmias on telemetry other than frequent PAC. Mag 1.8, patient was given 2gm of magnesium. Troponin x 2 negative. Discussed presentation, workup, and EKG with Dr. Saralyn Pilar, cardiology on call who recommended f/u with Dr. Ubaldo Glassing this week for possible Holter monitoring. Patient will be discharged home. Discuss return precautions for CP, SOB, dizziness, syncope or other new symptoms.   [CV]    Clinical Course User Index [CV] Rudene Re, MD  Pertinent labs & imaging results that were available during my care of the patient were reviewed by me and considered in my medical decision making (see chart for details).    ____________________________________________   FINAL CLINICAL IMPRESSION(S) / ED DIAGNOSES  Final diagnoses:  Palpitations      NEW MEDICATIONS STARTED DURING THIS VISIT:  New Prescriptions   No medications on file     Note:  This document was prepared using Dragon voice recognition software and may include unintentional dictation errors.    Rudene Re, MD 05/08/16 [phone]

## 2016-05-15 DIAGNOSIS — Z0000 Encounter for general adult medical examination without abnormal findings: Secondary | ICD-10-CM | POA: Diagnosis not present

## 2016-05-15 DIAGNOSIS — E782 Mixed hyperlipidemia: Secondary | ICD-10-CM | POA: Diagnosis not present

## 2016-05-22 DIAGNOSIS — Z0000 Encounter for general adult medical examination without abnormal findings: Secondary | ICD-10-CM | POA: Diagnosis not present

## 2016-05-22 DIAGNOSIS — E782 Mixed hyperlipidemia: Secondary | ICD-10-CM | POA: Diagnosis not present

## 2016-05-22 DIAGNOSIS — I25119 Atherosclerotic heart disease of native coronary artery with unspecified angina pectoris: Secondary | ICD-10-CM | POA: Diagnosis not present

## 2016-05-22 DIAGNOSIS — I10 Essential (primary) hypertension: Secondary | ICD-10-CM | POA: Diagnosis not present

## 2016-05-22 DIAGNOSIS — M810 Age-related osteoporosis without current pathological fracture: Secondary | ICD-10-CM | POA: Diagnosis not present

## 2016-05-23 DIAGNOSIS — E782 Mixed hyperlipidemia: Secondary | ICD-10-CM | POA: Diagnosis not present

## 2016-05-23 DIAGNOSIS — I25119 Atherosclerotic heart disease of native coronary artery with unspecified angina pectoris: Secondary | ICD-10-CM | POA: Diagnosis not present

## 2016-05-23 DIAGNOSIS — I341 Nonrheumatic mitral (valve) prolapse: Secondary | ICD-10-CM | POA: Diagnosis not present

## 2016-05-23 DIAGNOSIS — I479 Paroxysmal tachycardia, unspecified: Secondary | ICD-10-CM | POA: Diagnosis not present

## 2016-05-23 DIAGNOSIS — I10 Essential (primary) hypertension: Secondary | ICD-10-CM | POA: Diagnosis not present

## 2016-06-05 DIAGNOSIS — M810 Age-related osteoporosis without current pathological fracture: Secondary | ICD-10-CM | POA: Diagnosis not present

## 2016-06-14 DIAGNOSIS — I341 Nonrheumatic mitral (valve) prolapse: Secondary | ICD-10-CM | POA: Diagnosis not present

## 2016-06-19 DIAGNOSIS — E782 Mixed hyperlipidemia: Secondary | ICD-10-CM | POA: Diagnosis not present

## 2016-06-19 DIAGNOSIS — I10 Essential (primary) hypertension: Secondary | ICD-10-CM | POA: Diagnosis not present

## 2016-06-19 DIAGNOSIS — I341 Nonrheumatic mitral (valve) prolapse: Secondary | ICD-10-CM | POA: Diagnosis not present

## 2016-06-19 DIAGNOSIS — I25119 Atherosclerotic heart disease of native coronary artery with unspecified angina pectoris: Secondary | ICD-10-CM | POA: Diagnosis not present

## 2016-06-24 IMAGING — MG MM DIGITAL SCREENING BILAT W/ CAD
1 series · 4 of 4 positions shown · non-contrast
Comparison: Previous exam(s).

CLINICAL DATA: Screening.

EXAM:
DIGITAL SCREENING BILATERAL MAMMOGRAM WITH CAD

[R CC · right · 4 of 4 slices shown]
[im 1/4]
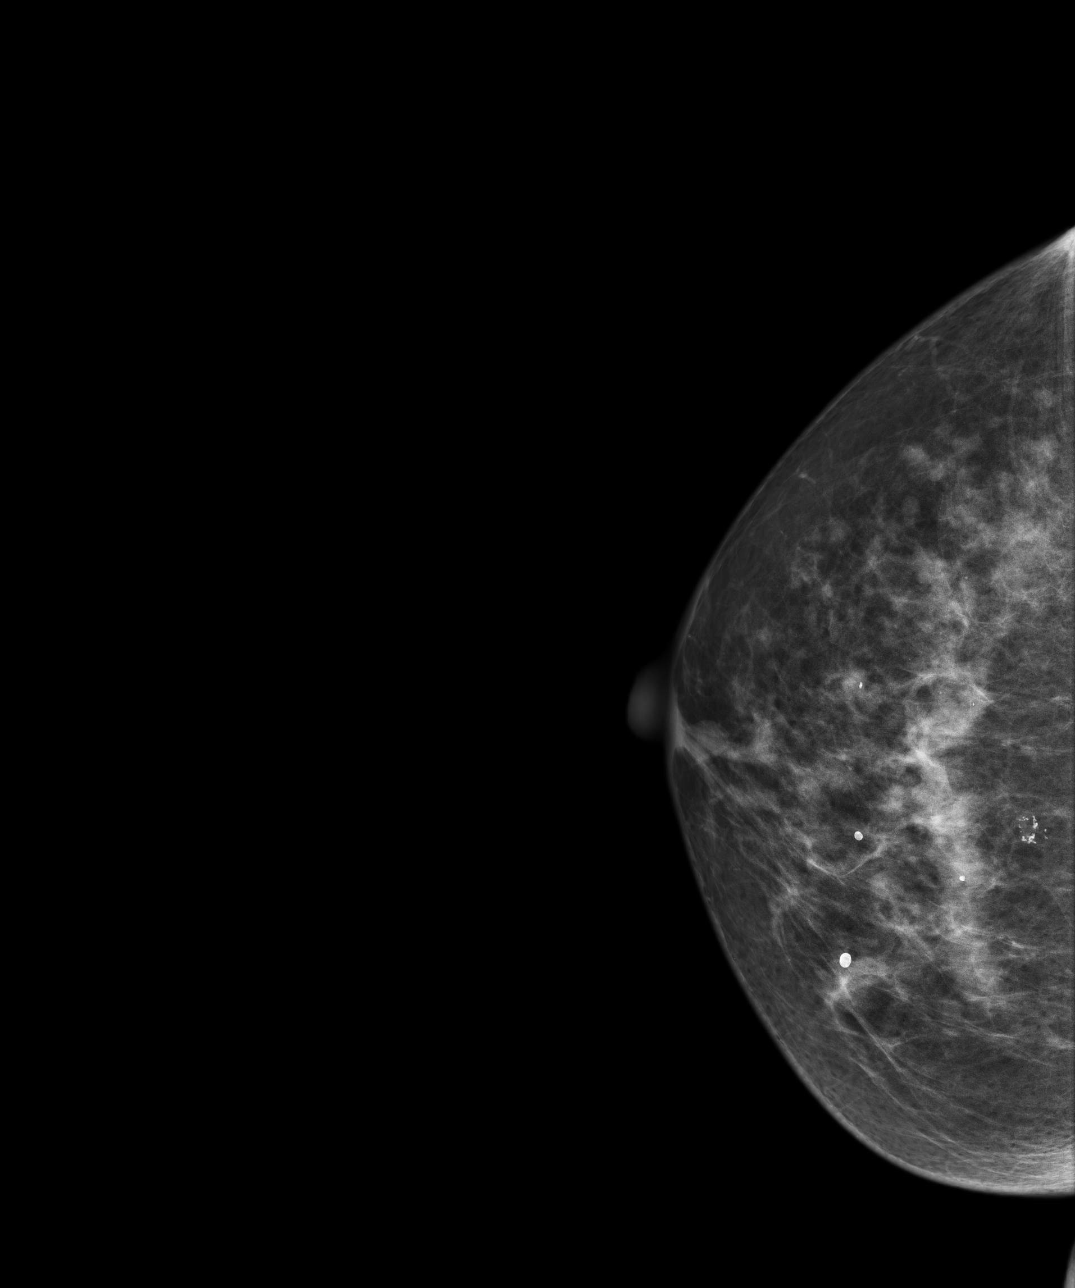
[im 2/4]
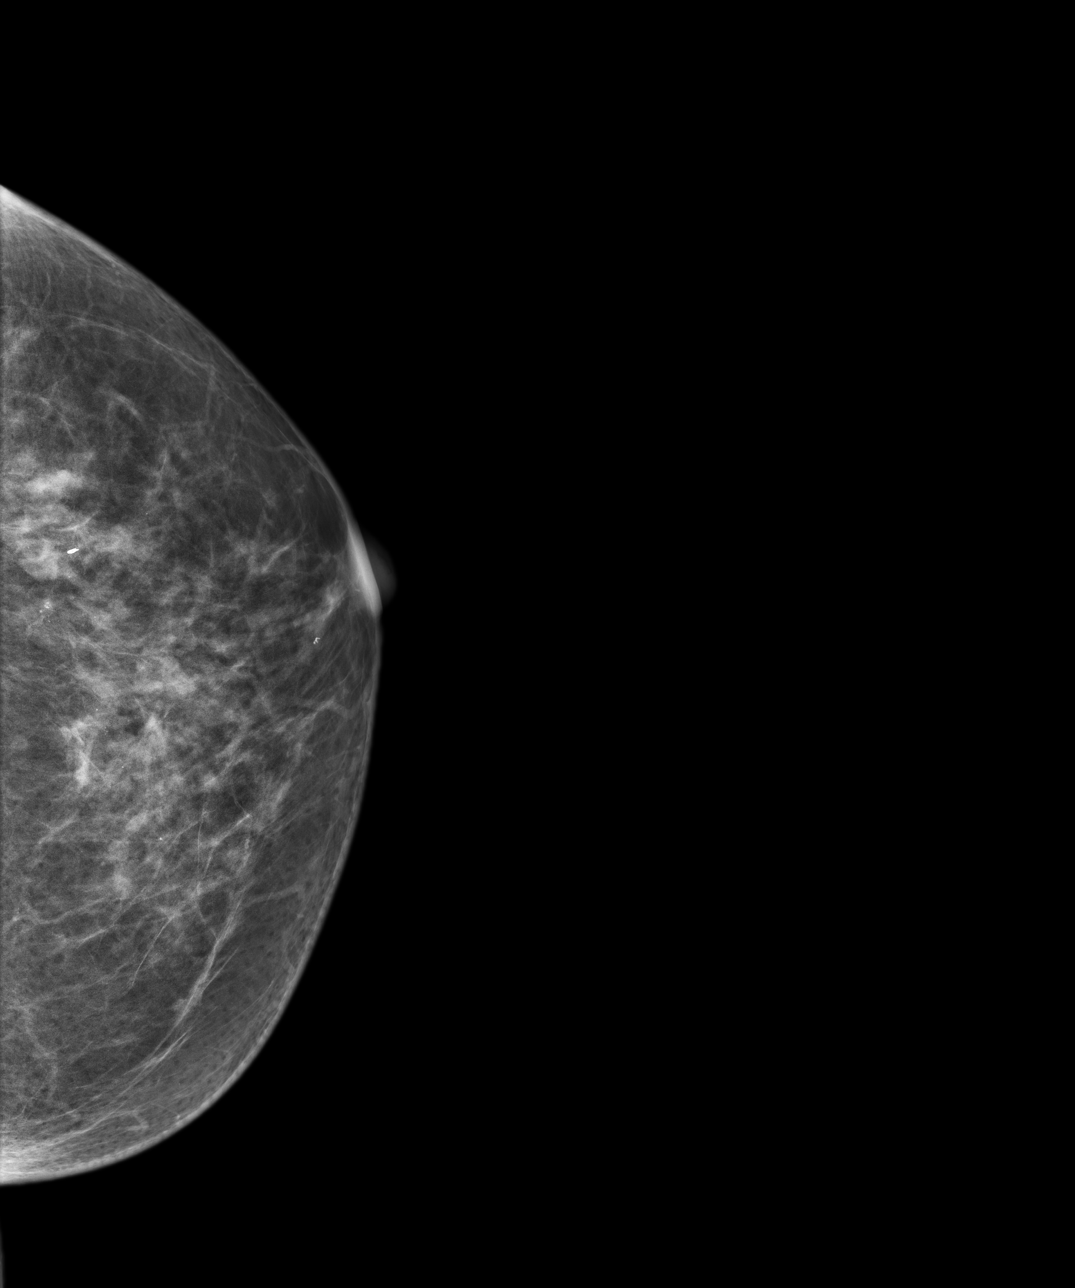
[im 3/4]
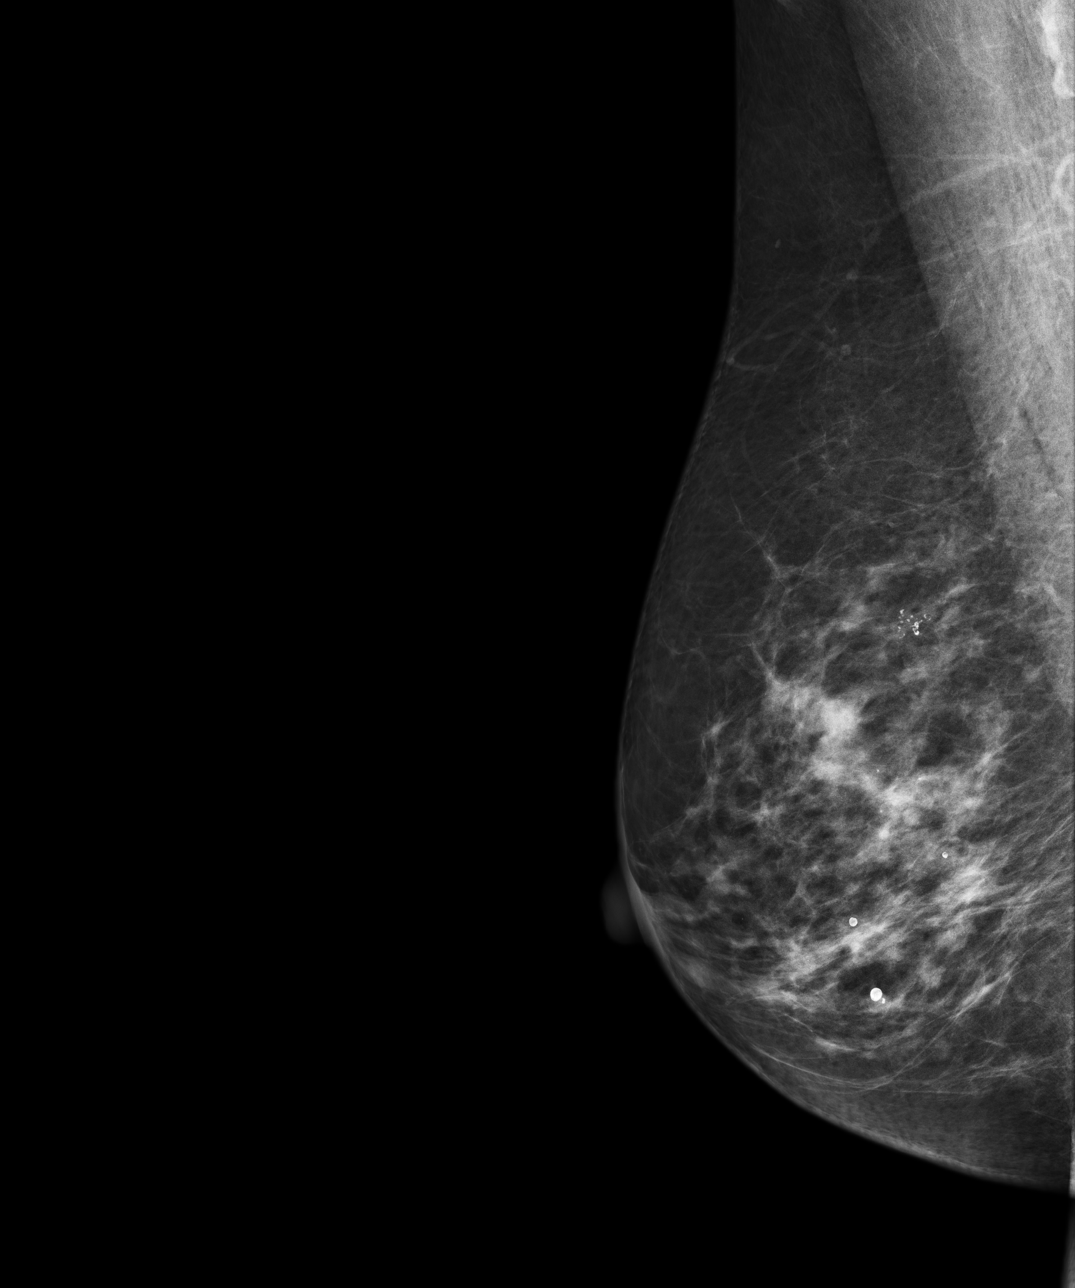
[im 4/4]
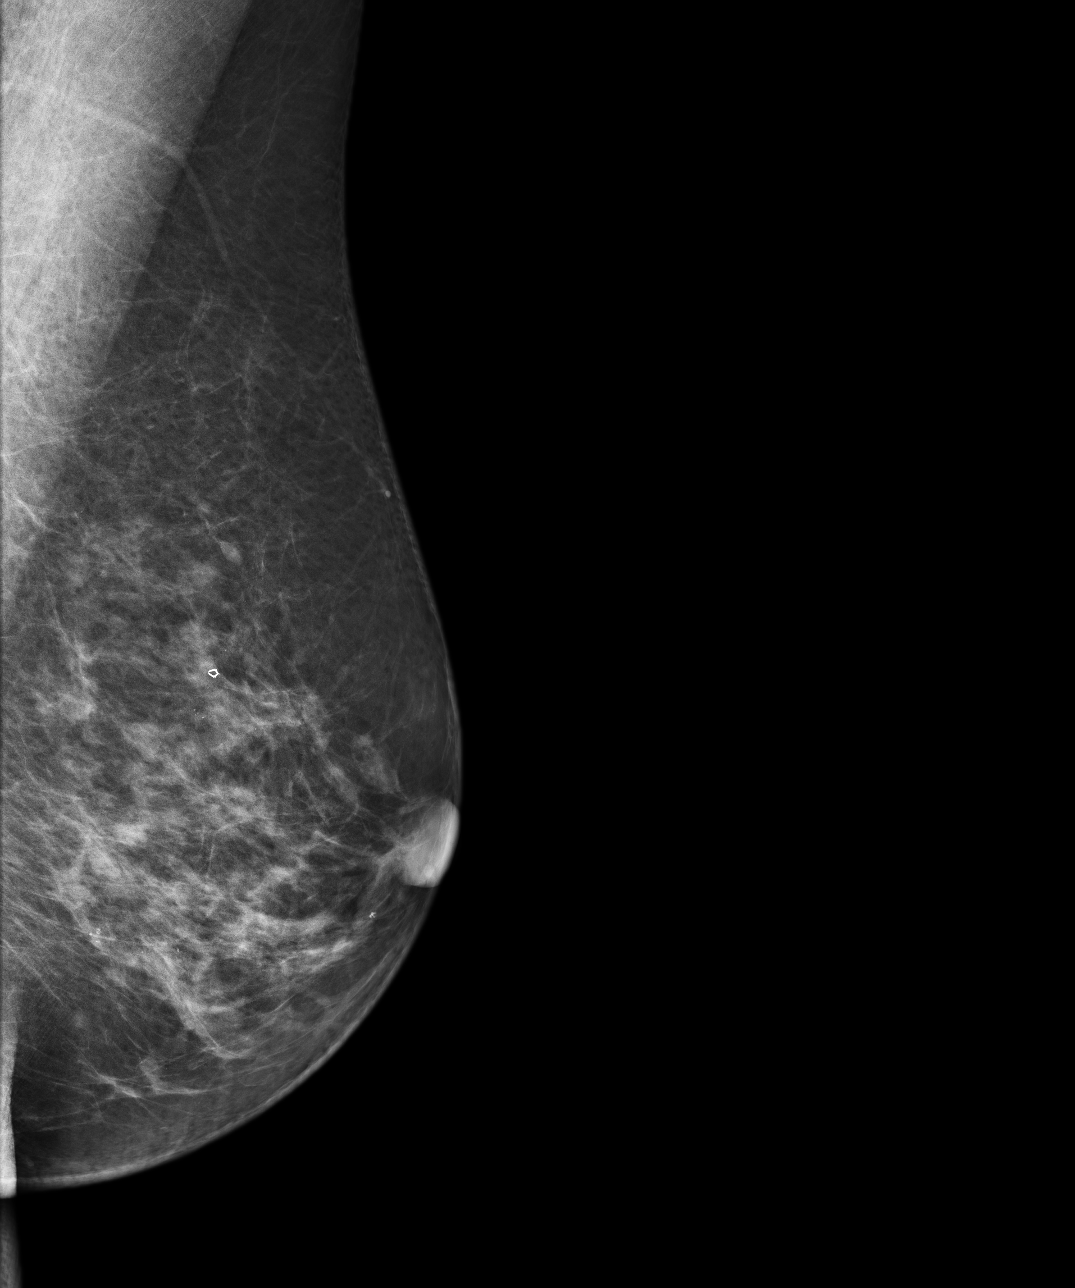

[4 of 4 positions shown; findings below may reference images not displayed]

ACR Breast Density Category c: The breast tissue is heterogeneously
dense, which may obscure small masses.
FINDINGS: There are no findings suspicious for malignancy. Images were
processed with CAD.
IMPRESSION: No mammographic evidence of malignancy. A result letter of this
screening mammogram will be mailed directly to the patient.

RECOMMENDATION:
Screening mammogram in one year. (Code:YJ-[id number])

BI-RADS CATEGORY  1: Negative.

## 2016-07-17 DIAGNOSIS — I10 Essential (primary) hypertension: Secondary | ICD-10-CM | POA: Diagnosis not present

## 2016-07-17 DIAGNOSIS — R002 Palpitations: Secondary | ICD-10-CM | POA: Diagnosis not present

## 2016-07-17 DIAGNOSIS — I25119 Atherosclerotic heart disease of native coronary artery with unspecified angina pectoris: Secondary | ICD-10-CM | POA: Diagnosis not present

## 2016-07-17 DIAGNOSIS — E782 Mixed hyperlipidemia: Secondary | ICD-10-CM | POA: Diagnosis not present

## 2016-07-17 DIAGNOSIS — I341 Nonrheumatic mitral (valve) prolapse: Secondary | ICD-10-CM | POA: Diagnosis not present

## 2016-07-18 DIAGNOSIS — L300 Nummular dermatitis: Secondary | ICD-10-CM | POA: Diagnosis not present

## 2016-07-18 DIAGNOSIS — L820 Inflamed seborrheic keratosis: Secondary | ICD-10-CM | POA: Diagnosis not present

## 2016-07-18 DIAGNOSIS — L739 Follicular disorder, unspecified: Secondary | ICD-10-CM | POA: Diagnosis not present

## 2016-08-15 DIAGNOSIS — J40 Bronchitis, not specified as acute or chronic: Secondary | ICD-10-CM | POA: Diagnosis not present

## 2016-08-15 DIAGNOSIS — J019 Acute sinusitis, unspecified: Secondary | ICD-10-CM | POA: Diagnosis not present

## 2016-10-17 DIAGNOSIS — H524 Presbyopia: Secondary | ICD-10-CM | POA: Diagnosis not present

## 2016-10-17 DIAGNOSIS — H353132 Nonexudative age-related macular degeneration, bilateral, intermediate dry stage: Secondary | ICD-10-CM | POA: Diagnosis not present

## 2016-10-17 DIAGNOSIS — H04123 Dry eye syndrome of bilateral lacrimal glands: Secondary | ICD-10-CM | POA: Diagnosis not present

## 2016-10-26 ENCOUNTER — Other Ambulatory Visit: Payer: Self-pay | Admitting: Internal Medicine

## 2016-10-26 DIAGNOSIS — Z1231 Encounter for screening mammogram for malignant neoplasm of breast: Secondary | ICD-10-CM

## 2016-11-21 DIAGNOSIS — E782 Mixed hyperlipidemia: Secondary | ICD-10-CM | POA: Diagnosis not present

## 2016-11-21 DIAGNOSIS — I10 Essential (primary) hypertension: Secondary | ICD-10-CM | POA: Diagnosis not present

## 2016-11-21 DIAGNOSIS — I25119 Atherosclerotic heart disease of native coronary artery with unspecified angina pectoris: Secondary | ICD-10-CM | POA: Diagnosis not present

## 2016-11-28 ENCOUNTER — Ambulatory Visit
Admission: RE | Admit: 2016-11-28 | Discharge: 2016-11-28 | Disposition: A | Discharge status: Home / Self Care | Payer: Medicare HMO | Source: Ambulatory Visit | Attending: Internal Medicine | Admitting: Internal Medicine

## 2016-11-28 DIAGNOSIS — Z1231 Encounter for screening mammogram for malignant neoplasm of breast: Secondary | ICD-10-CM

## 2016-11-28 DIAGNOSIS — I25119 Atherosclerotic heart disease of native coronary artery with unspecified angina pectoris: Secondary | ICD-10-CM | POA: Diagnosis not present

## 2016-11-28 DIAGNOSIS — E782 Mixed hyperlipidemia: Secondary | ICD-10-CM | POA: Diagnosis not present

## 2016-11-28 DIAGNOSIS — M810 Age-related osteoporosis without current pathological fracture: Secondary | ICD-10-CM | POA: Diagnosis not present

## 2016-11-28 DIAGNOSIS — Z0000 Encounter for general adult medical examination without abnormal findings: Secondary | ICD-10-CM | POA: Diagnosis not present

## 2016-11-29 ENCOUNTER — Other Ambulatory Visit: Payer: Self-pay | Admitting: Internal Medicine

## 2016-11-29 DIAGNOSIS — R921 Mammographic calcification found on diagnostic imaging of breast: Secondary | ICD-10-CM

## 2016-11-29 DIAGNOSIS — R928 Other abnormal and inconclusive findings on diagnostic imaging of breast: Secondary | ICD-10-CM

## 2016-11-29 DIAGNOSIS — N6320 Unspecified lump in the left breast, unspecified quadrant: Secondary | ICD-10-CM

## 2016-11-30 DIAGNOSIS — H04123 Dry eye syndrome of bilateral lacrimal glands: Secondary | ICD-10-CM | POA: Diagnosis not present

## 2016-12-07 ENCOUNTER — Ambulatory Visit
Admission: RE | Admit: 2016-12-07 | Discharge: 2016-12-07 | Disposition: A | Discharge status: Home / Self Care | Payer: Medicare HMO | Source: Ambulatory Visit | Attending: Internal Medicine | Admitting: Internal Medicine

## 2016-12-07 DIAGNOSIS — R928 Other abnormal and inconclusive findings on diagnostic imaging of breast: Secondary | ICD-10-CM

## 2016-12-07 DIAGNOSIS — N6320 Unspecified lump in the left breast, unspecified quadrant: Secondary | ICD-10-CM

## 2016-12-07 DIAGNOSIS — R921 Mammographic calcification found on diagnostic imaging of breast: Secondary | ICD-10-CM

## 2016-12-07 DIAGNOSIS — N6489 Other specified disorders of breast: Secondary | ICD-10-CM | POA: Diagnosis not present

## 2016-12-08 ENCOUNTER — Other Ambulatory Visit: Payer: Self-pay | Admitting: Internal Medicine

## 2016-12-08 DIAGNOSIS — R928 Other abnormal and inconclusive findings on diagnostic imaging of breast: Secondary | ICD-10-CM

## 2016-12-14 ENCOUNTER — Ambulatory Visit
Admission: RE | Admit: 2016-12-14 | Discharge: 2016-12-14 | Disposition: A | Discharge status: Home / Self Care | Payer: Medicare HMO | Source: Ambulatory Visit | Attending: Internal Medicine | Admitting: Internal Medicine

## 2016-12-14 DIAGNOSIS — R928 Other abnormal and inconclusive findings on diagnostic imaging of breast: Secondary | ICD-10-CM

## 2016-12-14 DIAGNOSIS — N6324 Unspecified lump in the left breast, lower inner quadrant: Secondary | ICD-10-CM | POA: Diagnosis not present

## 2016-12-14 DIAGNOSIS — C50919 Malignant neoplasm of unspecified site of unspecified female breast: Secondary | ICD-10-CM

## 2016-12-14 DIAGNOSIS — C50312 Malignant neoplasm of lower-inner quadrant of left female breast: Secondary | ICD-10-CM | POA: Diagnosis not present

## 2016-12-14 HISTORY — DX: Malignant neoplasm of unspecified site of unspecified female breast: C50.919

## 2016-12-14 HISTORY — PX: BREAST BIOPSY: SHX20

## 2016-12-19 DIAGNOSIS — R897 Abnormal histological findings in specimens from other organs, systems and tissues: Secondary | ICD-10-CM | POA: Diagnosis not present

## 2016-12-19 LAB — SURGICAL PATHOLOGY

## 2016-12-20 DIAGNOSIS — C50312 Malignant neoplasm of lower-inner quadrant of left female breast: Secondary | ICD-10-CM | POA: Insufficient documentation

## 2016-12-21 ENCOUNTER — Telehealth: Payer: Self-pay | Admitting: Oncology

## 2016-12-21 ENCOUNTER — Ambulatory Visit: Payer: Self-pay | Admitting: General Surgery

## 2016-12-21 ENCOUNTER — Other Ambulatory Visit: Payer: Self-pay | Admitting: *Deleted

## 2016-12-21 ENCOUNTER — Encounter: Payer: Self-pay | Admitting: Oncology

## 2016-12-21 ENCOUNTER — Inpatient Hospital Stay: Payer: Medicare HMO

## 2016-12-21 ENCOUNTER — Inpatient Hospital Stay: Payer: Medicare HMO | Attending: Oncology | Admitting: Oncology

## 2016-12-21 VITALS — BP 154/84 | HR 85 | Temp 97.2°F | Ht 63.0 in | Wt 124.6 lb

## 2016-12-21 DIAGNOSIS — E7800 Pure hypercholesterolemia, unspecified: Secondary | ICD-10-CM | POA: Diagnosis not present

## 2016-12-21 DIAGNOSIS — N3010 Interstitial cystitis (chronic) without hematuria: Secondary | ICD-10-CM | POA: Diagnosis not present

## 2016-12-21 DIAGNOSIS — Z7982 Long term (current) use of aspirin: Secondary | ICD-10-CM

## 2016-12-21 DIAGNOSIS — E785 Hyperlipidemia, unspecified: Secondary | ICD-10-CM | POA: Diagnosis not present

## 2016-12-21 DIAGNOSIS — Z85828 Personal history of other malignant neoplasm of skin: Secondary | ICD-10-CM

## 2016-12-21 DIAGNOSIS — K449 Diaphragmatic hernia without obstruction or gangrene: Secondary | ICD-10-CM | POA: Diagnosis not present

## 2016-12-21 DIAGNOSIS — J45909 Unspecified asthma, uncomplicated: Secondary | ICD-10-CM

## 2016-12-21 DIAGNOSIS — M5030 Other cervical disc degeneration, unspecified cervical region: Secondary | ICD-10-CM

## 2016-12-21 DIAGNOSIS — K219 Gastro-esophageal reflux disease without esophagitis: Secondary | ICD-10-CM | POA: Diagnosis not present

## 2016-12-21 DIAGNOSIS — Z171 Estrogen receptor negative status [ER-]: Secondary | ICD-10-CM

## 2016-12-21 DIAGNOSIS — F419 Anxiety disorder, unspecified: Secondary | ICD-10-CM | POA: Diagnosis not present

## 2016-12-21 DIAGNOSIS — E039 Hypothyroidism, unspecified: Secondary | ICD-10-CM

## 2016-12-21 DIAGNOSIS — Z800 Family history of malignant neoplasm of digestive organs: Secondary | ICD-10-CM

## 2016-12-21 DIAGNOSIS — D649 Anemia, unspecified: Secondary | ICD-10-CM

## 2016-12-21 DIAGNOSIS — Z23 Encounter for immunization: Secondary | ICD-10-CM

## 2016-12-21 DIAGNOSIS — C50919 Malignant neoplasm of unspecified site of unspecified female breast: Secondary | ICD-10-CM | POA: Insufficient documentation

## 2016-12-21 DIAGNOSIS — K589 Irritable bowel syndrome without diarrhea: Secondary | ICD-10-CM

## 2016-12-21 DIAGNOSIS — N816 Rectocele: Secondary | ICD-10-CM

## 2016-12-21 DIAGNOSIS — R270 Ataxia, unspecified: Secondary | ICD-10-CM | POA: Diagnosis not present

## 2016-12-21 DIAGNOSIS — Z79899 Other long term (current) drug therapy: Secondary | ICD-10-CM

## 2016-12-21 DIAGNOSIS — C50312 Malignant neoplasm of lower-inner quadrant of left female breast: Secondary | ICD-10-CM

## 2016-12-21 DIAGNOSIS — N6019 Diffuse cystic mastopathy of unspecified breast: Secondary | ICD-10-CM | POA: Diagnosis not present

## 2016-12-21 DIAGNOSIS — R42 Dizziness and giddiness: Secondary | ICD-10-CM | POA: Diagnosis not present

## 2016-12-21 DIAGNOSIS — Z8673 Personal history of transient ischemic attack (TIA), and cerebral infarction without residual deficits: Secondary | ICD-10-CM | POA: Diagnosis not present

## 2016-12-21 DIAGNOSIS — M810 Age-related osteoporosis without current pathological fracture: Secondary | ICD-10-CM | POA: Diagnosis not present

## 2016-12-21 DIAGNOSIS — Z86010 Personal history of colonic polyps: Secondary | ICD-10-CM | POA: Diagnosis not present

## 2016-12-21 DIAGNOSIS — Z8719 Personal history of other diseases of the digestive system: Secondary | ICD-10-CM | POA: Diagnosis not present

## 2016-12-21 DIAGNOSIS — I10 Essential (primary) hypertension: Secondary | ICD-10-CM | POA: Diagnosis not present

## 2016-12-21 DIAGNOSIS — R0602 Shortness of breath: Secondary | ICD-10-CM | POA: Diagnosis not present

## 2016-12-21 DIAGNOSIS — I341 Nonrheumatic mitral (valve) prolapse: Secondary | ICD-10-CM

## 2016-12-21 DIAGNOSIS — Z87891 Personal history of nicotine dependence: Secondary | ICD-10-CM

## 2016-12-21 DIAGNOSIS — R0600 Dyspnea, unspecified: Secondary | ICD-10-CM | POA: Diagnosis not present

## 2016-12-21 DIAGNOSIS — Z01818 Encounter for other preprocedural examination: Secondary | ICD-10-CM | POA: Diagnosis not present

## 2016-12-21 MED ORDER — INFLUENZA VAC SPLIT QUAD 0.5 ML IM SUSY: 0.5000 mL | PREFILLED_SYRINGE | Freq: Once | INTRAMUSCULAR | Status: DC

## 2016-12-21 NOTE — Progress Notes (Signed)
Hematology/Oncology Consult note [facility] Telephone:([phone] [phone]  Patient Care Team: Rusty Aus, MD as PCP - General (Internal Medicine)   Name of the patient: Doris Barnes  [id number]  01-26-1941    Reason for referral- left breast cancer   Referring physician- Dr. Sabra Heck  Date of visit: 12/21/16   History of presenting illness- 1. Patient is a 76 year old female who underwent bilateral screening mammogram on 11/28/2016 which showed a possible mass and calcifications in her left breast. This was followed by diagnostic mammogram and an ultrasound of the left breast which showed: 1.4 x 1.2 x 1.3 cm irregular mass in the left breast at the 6:30 position 3 cm from the nipple. On magnification views the overall extent of the mass and calcifications measures 1.9 cm. Left axilla was evaluated with ultrasound and showed no enlarged and morphologically abnormal lymph nodes.   2. She underwent core biopsy of the left breast mass which showed: Invasive mammary carcinoma, grade 3. No DCIS or lymphovascular invasion. ER was negative and PR was 11-50% positive and HER-2 was +3 on IHC  3 [name] who plans for elective lumpectomy with sentinel lymph node biopsy pending oncology opinion.  4. Menarche at the age of 71 years. G5 P5 L5. She did have two prior biopsies of her left breast which did not reveal any malignancy. No family history of breast or ovarian cancer. Her sister and maternal grandfather had colon cancer. She is considerably anxious today at the thought of getting chemotherapy. She does have a history of mitral valve prolapse and states that she has "irregular heartbeat"for which she is on atenolol. She had a Holter monitor in the past but was not found to have any arrhythmia.  ECOG PS- 0  Pain scale- 0   Review of systems- Review of Systems  Constitutional: Negative for chills, fever, malaise/fatigue  and weight loss.  HENT: Negative for congestion, ear discharge and nosebleeds.   Eyes: Negative for blurred vision.  Respiratory: Negative for cough, hemoptysis, sputum production, shortness of breath and wheezing.   Cardiovascular: Negative for chest pain, palpitations, orthopnea and claudication.  Gastrointestinal: Negative for abdominal pain, blood in stool, constipation, diarrhea, heartburn, melena, nausea and vomiting.  Genitourinary: Negative for dysuria, flank pain, frequency, hematuria and urgency.  Musculoskeletal: Negative for back pain, joint pain and myalgias.  Skin: Negative for rash.  Neurological: Negative for dizziness, tingling, focal weakness, seizures, weakness and headaches.  Endo/Heme/Allergies: Does not bruise/bleed easily.  Psychiatric/Behavioral: Negative for depression and suicidal ideas. The patient is nervous/anxious. The patient does not have insomnia.     Allergies  Allergen Reactions  . Imipramine Pamoate Shortness Of Breath  . Epinephrine   . Sulfa Antibiotics Rash    Face turns red and stings    Patient Active Problem List   Diagnosis Date Noted  . TIA (transient ischemic attack) 02/22/2016  . Ischemic colitis ([place]) 02/22/2016  . Psoriasiform dermatitis 01/07/2015  . Bloodgood disease 12/31/2014  . HLD (hyperlipidemia) 12/31/2014  . Headache, migraine 12/31/2014  . Billowing mitral valve 12/31/2014  . Osteoporosis, post-menopausal 12/31/2014  . Squamous cell carcinoma 12/31/2014  . Essential hypertension 09/18/2014  . Degeneration of cervical intervertebral disc 09/18/2014  . Ataxia 09/17/2014  . Adaptive colitis 02/02/2014     Past Medical History:  Diagnosis Date  . Anemia   . Anxiety   . Asthma   . Cancer ([place]) skin  . Chronic vulvitis   .  Colon polyp   . Cystocele   . Diverticulitis   . Diverticulitis   . Diverticulosis   . Diverticulosis   . Dyspareunia, female   . Fibrocystic disease of both breasts   . Gastritis   . GERD  (gastroesophageal reflux disease)    gastritis also  . Hemorrhoids   . History of hiatal hernia   . Hypercholesteremia   . Hypertension   . Hypothyroid   . IBS (irritable bowel syndrome)   . IC (interstitial cystitis)   . Migraine   . Mitral valve prolapse   . OP (osteoporosis)   . Positional vertigo   . Rectocele   . Squamous cell carcinoma   . Stroke [facility])    TIA  . Vaginal atrophy      Past Surgical History:  Procedure Laterality Date  . APPENDECTOMY    . BREAST BIOPSY Left 1998   core bx- neg  . BREAST BIOPSY Left 2007   neg  . BREAST BIOPSY Left 12/14/2016   left breast US core path pending  . BREAST EXCISIONAL BIOPSY Left   . CARDIAC CATHETERIZATION N/A 01/18/2015   Procedure: Left Heart Cath and Coronary Angiography;  Surgeon: Teodoro Spray, MD;  Location: [facility];  Service: Cardiovascular;  Laterality: N/A;  . CATARACT EXTRACTION W/PHACO Right 12/15/2015   Procedure: CATARACT EXTRACTION PHACO AND INTRAOCULAR LENS PLACEMENT (IOC);  Surgeon: Estill Cotta, MD;  Location: [facility] ORS;  Service: Ophthalmology;  Laterality: Right;  [place] 01:20 AP% 23.9 CDE 35.49 Fluid pack lot # [id number]  . CATARACT EXTRACTION W/PHACO Left 12/29/2015   Procedure: CATARACT EXTRACTION PHACO AND INTRAOCULAR LENS PLACEMENT ([place]);  Surgeon: Estill Cotta, MD;  Location: [facility] ORS;  Service: Ophthalmology;  Laterality: Left;  [place]  01:20 AP% 25.1 CDE 32.86 Fluid pack lot # [id number]  . COLON SURGERY    . COLONOSCOPY WITH PROPOFOL N/A 02/21/2016   Procedure: COLONOSCOPY WITH PROPOFOL;  Surgeon: Manya Silvas, MD;  Location: [facility] ENDOSCOPY;  Service: Endoscopy;  Laterality: N/A;  . CORONARY ANGIOPLASTY    . DILATION AND CURETTAGE OF UTERUS    . OOPHORECTOMY Bilateral   . TONSILLECTOMY    . VAGINAL HYSTERECTOMY      Social History   Social History  . Marital status: Married    Spouse name: N/A  . Number of children: N/A  . Years of education: N/A   Occupational  History  . Not on file.   Social History Main Topics  . Smoking status: Former Smoker    Packs/day: 1.00    Years: 33.00    Quit date: 10/17/1987  . Smokeless tobacco: Never Used  . Alcohol use No  . Drug use: No  . Sexual activity: Yes   Other Topics Concern  . Not on file   Social History Narrative  . No narrative on file     Family History  Problem Relation Age of Onset  . Diabetes Mother   . Diabetes Father   . Colon cancer Sister   . Diabetes Sister   . Diabetes Maternal Aunt   . Colon cancer Paternal Grandfather   . Breast cancer Neg Hx   . Ovarian cancer Neg Hx      Current Outpatient Prescriptions:  .  acetaminophen (TYLENOL) 325 MG tablet, Take 650 mg by mouth every 6 (six) hours as needed for mild pain., Disp: , Rfl:  .  ALPRAZolam (XANAX) 0.25 MG tablet, 0.25 mg 2 (two) times daily as needed for anxiety  or sleep. , Disp: , Rfl:  .  aspirin EC 325 MG tablet, Take 325 mg by mouth daily. , Disp: , Rfl:  .  atenolol (TENORMIN) 50 MG tablet, Take 50 mg by mouth daily. , Disp: , Rfl:  .  calcium citrate-vitamin D (CITRACAL+D) 315-200 MG-UNIT tablet, Take 1 tablet by mouth 2 (two) times daily., Disp: , Rfl:  .  FLUoxetine (PROZAC) 20 MG capsule, Take 20 mg by mouth at bedtime. , Disp: , Rfl:  .  fluticasone (FLONASE) 50 MCG/ACT nasal spray, Place 2 sprays into both nostrils daily as needed. , Disp: , Rfl:  .  hydrochlorothiazide (HYDRODIURIL) 25 MG tablet, Take 12.5 mg by mouth daily. , Disp: , Rfl:  .  lansoprazole (PREVACID) 30 MG capsule, Take 30 mg by mouth daily. , Disp: , Rfl:  .  loratadine (CLARITIN) 10 MG tablet, Take 10 mg by mouth daily. , Disp: , Rfl:  .  magnesium oxide (MAG-OX) 400 MG tablet, Take 250 mg by mouth daily., Disp: , Rfl:  .  Multiple Vitamins-Minerals (PRESERVISION AREDS 2) CAPS, Take 1 capsule by mouth 2 (two) times daily. , Disp: , Rfl:  .  simvastatin (ZOCOR) 40 MG tablet, Take 40 mg by mouth daily at 6 PM. , Disp: , Rfl:  .  telmisartan  (MICARDIS) 40 MG tablet, Take 40 mg by mouth daily., Disp: , Rfl:   Current Facility-Administered Medications:  .  Influenza vac split quadrivalent PF (FLUARIX) injection 0.5 mL, 0.5 mL, Intramuscular, Once, Sindy Guadeloupe, MD   Physical exam:  Vitals:   12/21/16 0845  BP: (!) 154/84  Pulse: 85  Temp: (!) 97.2 F (36.2 C)  TempSrc: Tympanic  Weight: 124 lb 9.6 oz (56.5 kg)  Height: _0  (1.6 m)   Physical Exam  Constitutional: She is oriented to person, place, and time and well-developed, well-nourished, and in no distress.  HENT:  Head: Normocephalic and atraumatic.  Eyes: Pupils are equal, round, and reactive to light. EOM are normal.  Neck: Normal range of motion.  Cardiovascular: Normal rate, regular rhythm and normal heart sounds.   Pulmonary/Chest: Effort normal and breath sounds normal.  Abdominal: Soft. Bowel sounds are normal.  Neurological: She is alert and oriented to person, place, and time.  Skin: Skin is warm and dry.   Breast exam was performed in sitting and lying down position. No evidence of back palpable bilateral axillary adenopathy. No palpable masses in the right breast. Difficult to a certain the size of the left breast masses as well as considerable bruising at the site of the biopsy   CMP Latest Ref Rng & Units 05/08/2016  Glucose 65 - 99 mg/dL 180(H)  BUN 6 - 20 mg/dL 12  Creatinine 0.44 - 1.00 mg/dL 0.66  Sodium 135 - 145 mmol/L 135  Potassium 3.5 - 5.1 mmol/L 4.0  Chloride 101 - 111 mmol/L 100(L)  CO2 22 - 32 mmol/L 29  Calcium 8.9 - 10.3 mg/dL 9.1  Total Protein 6.5 - 8.1 g/dL 7.2  Total Bilirubin 0.3 - 1.2 mg/dL 0.5  Alkaline Phos 38 - 126 U/L 76  AST 15 - 41 U/L 24  ALT 14 - 54 U/L 17   CBC Latest Ref Rng & Units 05/08/2016  WBC 3.6 - 11.0 K/uL 6.1  Hemoglobin 12.0 - 16.0 g/dL 13.2  Hematocrit 35.0 - 47.0 % 38.0  Platelets 150 - 440 K/uL 213    No images are attached to the encounter.  US Breast Ltd Uni  Left Inc Axilla  Result  Date: 12/07/2016 CLINICAL DATA:  Patient returns today to evaluate a possible left breast mass and calcifications identified on recent screening mammogram. EXAM: 2D DIGITAL DIAGNOSTIC LEFT MAMMOGRAM WITH CAD AND ADJUNCT TOMO ULTRASOUND LEFT BREAST COMPARISON:  Previous exams including recent screening mammogram dated 11/28/2016. ACR Breast Density Category b: There are scattered areas of fibroglandular density. FINDINGS: On today's additional views with spot compression and 3D tomosynthesis, an irregular mass is confirmed within the lower inner quadrant of the left breast, with associated architectural distortion, measuring approximately 1.3 cm greatest dimension, with associated microcalcifications. On magnification views, the overall extent of mass and calcifications measures 1.9 cm. Biopsy clip within the upper left breast is stable in position. Mammographic images were processed with CAD. Targeted ultrasound is performed, showing an irregular mass within the left breast at the 6:30 o'clock axis, 3 cm from the nipple, measuring 1.4 x 1.2 x 1.3 cm, with internal vascularity, corresponding to the mammographic finding. Left axilla was evaluated with ultrasound showing no enlarged or morphologically abnormal lymph nodes. IMPRESSION: Irregular mass within the left breast at the 6:30 o'clock axis, 3 cm from the nipple, measuring 1.4 cm, corresponding to the mammographic finding. This is a highly suspicious finding for which ultrasound-guided biopsy is recommended. Of note, there are associated suspicious microcalcifications within and immediately adjacent to the mass. The combination of mass and surrounding calcifications spans 1.9 cm. RECOMMENDATION: Ultrasound-guided biopsy of the left breast mass at the 6:30 o'clock axis, 3 cm from the nipple, measuring 1.4 cm. Ordering physician will be contacted with today's results and patient will then be scheduled for ultrasound-guided biopsy at her earliest convenience. I have  discussed the findings and recommendations with the patient. Results were also provided in writing at the conclusion of the visit. If applicable, a reminder letter will be sent to the patient regarding the next appointment. BI-RADS CATEGORY  5: Highly suggestive of malignancy. Electronically Signed   By: Franki Cabot M.D.   On: 12/07/2016 09:56   Mm Diag Breast Tomo Uni Left  Result Date: 12/07/2016 CLINICAL DATA:  Patient returns today to evaluate a possible left breast mass and calcifications identified on recent screening mammogram. EXAM: 2D DIGITAL DIAGNOSTIC LEFT MAMMOGRAM WITH CAD AND ADJUNCT TOMO ULTRASOUND LEFT BREAST COMPARISON:  Previous exams including recent screening mammogram dated 11/28/2016. ACR Breast Density Category b: There are scattered areas of fibroglandular density. FINDINGS: On today's additional views with spot compression and 3D tomosynthesis, an irregular mass is confirmed within the lower inner quadrant of the left breast, with associated architectural distortion, measuring approximately 1.3 cm greatest dimension, with associated microcalcifications. On magnification views, the overall extent of mass and calcifications measures 1.9 cm. Biopsy clip within the upper left breast is stable in position. Mammographic images were processed with CAD. Targeted ultrasound is performed, showing an irregular mass within the left breast at the 6:30 o'clock axis, 3 cm from the nipple, measuring 1.4 x 1.2 x 1.3 cm, with internal vascularity, corresponding to the mammographic finding. Left axilla was evaluated with ultrasound showing no enlarged or morphologically abnormal lymph nodes. IMPRESSION: Irregular mass within the left breast at the 6:30 o'clock axis, 3 cm from the nipple, measuring 1.4 cm, corresponding to the mammographic finding. This is a highly suspicious finding for which ultrasound-guided biopsy is recommended. Of note, there are associated suspicious microcalcifications within and  immediately adjacent to the mass. The combination of mass and surrounding calcifications spans 1.9 cm. RECOMMENDATION: Ultrasound-guided biopsy of the  left breast mass at the 6:30 o'clock axis, 3 cm from the nipple, measuring 1.4 cm. Ordering physician will be contacted with today's results and patient will then be scheduled for ultrasound-guided biopsy at her earliest convenience. I have discussed the findings and recommendations with the patient. Results were also provided in writing at the conclusion of the visit. If applicable, a reminder letter will be sent to the patient regarding the next appointment. BI-RADS CATEGORY  5: Highly suggestive of malignancy. Electronically Signed   By: Franki Cabot M.D.   On: 12/07/2016 09:56   Mm Screening Breast Tomo Bilateral  Result Date: 11/28/2016 CLINICAL DATA:  Screening. EXAM: 2D DIGITAL SCREENING BILATERAL MAMMOGRAM WITH CAD AND ADJUNCT TOMO COMPARISON:  Previous exam(s). ACR Breast Density Category b: There are scattered areas of fibroglandular density. FINDINGS: In the left breast, a possible mass and calcifications warrant further evaluation. In the right breast, no findings suspicious for malignancy. Images were processed with CAD. IMPRESSION: Further evaluation is suggested for possible mass and calcifications in the left breast. RECOMMENDATION: Diagnostic mammogram and possibly ultrasound of the left breast. (Code:[id number]-L-[id number]) The patient will be contacted regarding the findings, and additional imaging will be scheduled. BI-RADS CATEGORY  0: Incomplete. Need additional imaging evaluation and/or prior mammograms for comparison. Electronically Signed   By: Curlene Dolphin M.D.   On: 11/28/2016 17:15   Mm Clip Placement Left  Result Date: 12/14/2016 CLINICAL DATA:  Status post ultrasound-guided left breast biopsy EXAM: DIAGNOSTIC LEFT MAMMOGRAM POST ULTRASOUND BIOPSY COMPARISON:  Previous exam(s). FINDINGS: Mammographic images were obtained following ultrasound  guided biopsy of a left breast mass at 6:30, 3 cm from the nipple. Post biopsy mammogram demonstrates the coil shaped biopsy marker in the expected location of the mass in the lower, inner left breast. IMPRESSION: Appropriate marker position as above. Final Assessment: Post Procedure Mammograms for Marker Placement Electronically Signed   By: Pamelia Hoit M.D.   On: 12/14/2016 09:13   [place] Lt Breast Bx W Loc Dev 1st Lesion Img Bx Spec US Guide  Addendum Date: 12/18/2016   ADDENDUM REPORT: 12/18/2016 09:13 ADDENDUM: Pathology of the left breast biopsy revealed A. BREAST, LEFT, 6:30, 3 CM FROM NIPPLE; ULTRASOUND-GUIDED CORE BIOPSY: INVASIVE MAMMARY CARCINOMA, NO SPECIAL TYPE. Size of invasive carcinoma: 10 mm in this sample. Histologic grade of invasive carcinoma: Grade 3. Ductal carcinoma in situ: Not identified. Lymphovascular invasion: Not identified. Calcifications: Not identified. ER/PR/HER2: Immunohistochemistry will be performed on block A1, with reflex to [place] for HER2 2+. The results will be reported in an addendum. Comment: The definitive grade will be assigned on the excisional specimen. These findings were communicated to Evette Georges in the [facility] on 12/15/2016 at 1:58 PM. Read-back procedure was performed. This was found to be concordant by Dr. Brigitte Pulse. Recommendation: Surgical and oncology referrals. Also recommend a stereotactic guided biopsy of the anterior extent of calcifications seen mammographically for documentation of extent of disease. This was discussed with the patient at the time of biopsy by Dr. Brigitte Pulse. Results and recommendations were relayed to [facility], Dr. Ammie Ferrier nurse by Jetta Lout, [place] on 12/18/16 at 9:00 AM by phone. She will relay the recommendation to Dr. Sabra Heck regarding the additional biopsy. They will contact the patient with results and make the referrals. Addendum by Jetta Lout, RRA on 12/18/16. Electronically Signed   By: Pamelia Hoit M.D.   On: 12/18/2016  09:13   Result Date: 12/18/2016 CLINICAL DATA:  76 year old female for ultrasound-guided biopsy of a suspicious  left breast mass at 6:30, 3 cm from the nipple EXAM: ULTRASOUND GUIDED LEFT BREAST CORE NEEDLE BIOPSY COMPARISON:  Previous exam(s). FINDINGS: I met with the patient and we discussed the procedure of ultrasound-guided biopsy, including benefits and alternatives. We discussed the high likelihood of a successful procedure. We discussed the risks of the procedure, including infection, bleeding, tissue injury, clip migration, and inadequate sampling. Informed written consent was given. The usual time-out protocol was performed immediately prior to the procedure. Lesion quadrant: Lower inner quadrant Using sterile technique and 1% Lidocaine as local anesthetic, under direct ultrasound visualization, a 12 gauge spring-loaded device was used to perform biopsy of a suspicious left breast mass at 6:30, 3 cm from the nipple using a medial to lateral approach. At the conclusion of the procedure a coil shaped tissue marker clip was deployed into the biopsy cavity. Follow up 2 view mammogram was performed and dictated separately. IMPRESSION: Ultrasound guided biopsy of a suspicious left breast mass at 6:30. No apparent complications. Electronically Signed: By: Pamelia Hoit M.D. On: 12/14/2016 09:12    Assessment and plan- Patient is a 76 y.o. female with newly diagnosed clinical prognostic stage IA [id number] invasive mammary carcinoma of the left breast ER negative PR positive and HER-2 +3 on IHC  Discuss results of mammogram and ultrasound as well as pathology with the patient in detail. Patient noted to have a 1.4 - 1.9 mass on mammogram and ultrasound. No evidence of axillary adenopathy clinically. It was a grade 3 tumor ER negative PR 11-50% positive and HER-2 +3 on IHC.   Given that she had a tumor less than 2 cm on imaging I would recommend upfront surgery at this time - lumpectomy and sentinel lymph node  biopsy.   She will however need adjuvant chemotherapy given that she has an ER negative and HER-2 positive tumor. She will therefore need port placement at the time of surgery   If her tumor is found to be less than 2 cm and node negative on final path, I will consider weekly taxol and herceptin X 12 cycles for her adjuvantly. If she is found to have a larger tumor or node positive, I might consider TCH regimen for her and I will discuss the details of chemotherapy and side effects of chemotherapy when I see her on 01/05/2017 post surgery and discuss her final pathology   Patient will also need adjuvant radiation upon completion of chemotherapy and I will make referral to Dr. Donella Stade at that time  Patient may not derive significant benefit from hormonal therapy given that she is ER negative and PR positive but may potentially derive some benefit and I will discuss this in greater detail down the line upon completion of chemotherapy and radiation  I will obtain baseline MUGA scan in anticipation of giving her herceptin adjuvantly.    Thank you for this kind referral and the opportunity to participate in the care of this patient   Visit Diagnosis 1. Need for prophylactic vaccination and inoculation against influenza     Dr. Randa Evens, MD, MPH [facility] Pager- [phone] 12/21/2016  9:39 AM

## 2016-12-21 NOTE — Telephone Encounter (Signed)
MUGA appt schd and confirmed with patient.  Appointment details given to patient.

## 2016-12-21 NOTE — Progress Notes (Signed)
New patient with breast cancer diagnosis, very anxious about her diagnosis and treatment. She reports being sore from her biopsy last Thursday.

## 2016-12-21 NOTE — H&P (Signed)
PATIENT PROFILE: Doris Barnes is a 76 y.o. female who presents to the Clinic for consultation at the request of Dr. Sabra Barnes for evaluation of malignancy of the left breast.  PCP:  Doris Pax, MD  HISTORY OF PRESENT ILLNESS: Doris Barnes reports that she had her usual screening mammography which showed a suspicious lesion on the inner lower quadrant of the left breast. After complete workup, the lesion showed to be invasive Carcinoma. The patient denies breast pain, palpable mass, nipple changes or discharge and no skin changes. Patient refers first menstrual period was at 76 years old and last was at 76 years of age. Patient refers having 5 pregancies, the first one at 59. Denies chest radiation. Refers one previous biopsy on left breast which is not on chart is patient refers it was found a benign disease.     PROBLEM LIST:        Problem List  Date Reviewed: 12/20/2016         Noted   Malignant neoplasm of lower-inner quadrant of left female breast (CMS-HCC) 12/20/2016   Medicare annual wellness visit, initial 05/22/2016   Overview    2/18      History of adenomatous polyp of colon 12/06/2015   Overview    1/123 ,11/173      Hyperlipidemia, mixed 05/21/2015   Essential hypertension 02/26/2015   Coronary artery disease involving native coronary artery of native heart with angina pectoris (CMS-HCC) 01/26/2015   Overview    Cardiac catheterization in 2008 showed a 40% proximal LAD stenosis with normal LV function Cardiac catheterization in 2016 revealed a probable non dominant right coronary artery occlusion.  The left system was without significant disease.  Ejection fraction normal.      Irritable bowel syndrome without diarrhea 02/02/2014   Mitral valve prolapse Unknown   Overview    PVCs, MR      Osteoporosis, post-menopausal Unknown   Overview    IV Reclast 3/18, intolerant Vitamin D 55 2017, BD 9/17      Fibrocystic breast  disease Unknown   Squamous cell carcinoma Unknown   Migraine headache Unknown      GENERAL REVIEW OF SYSTEMS:   General ROS: negative for - chills, fatigue, fever, weight gain or weight loss Allergy and Immunology ROS: negative for - hives  Hematological and Lymphatic ROS: Positive for - easy bruising, negative for palpable nodes Endocrine ROS: negative for - heat or cold intolerance, hair changes Respiratory ROS: negative for - cough, shortness of breath or wheezing Cardiovascular ROS: no chest pain. Positive for palpitations GI ROS: negative for nausea, vomiting, abdominal pain, diarrhea, constipation Musculoskeletal ROS: negative for - joint swelling or muscle pain Neurological ROS: negative for - confusion, syncope Dermatological ROS: negative for pruritus and rash  MEDICATIONS: CurrentMedications        Current Outpatient Prescriptions  Medication Sig Dispense Refill  . ACETAMINOPHEN (TYLENOL ORAL) Take by mouth. 526m prn    . ALPRAZolam (XANAX) 0.25 MG tablet Take 1 tablet (0.25 mg total) by mouth once daily as needed. Patient takes 1/2 tab bid 30 tablet 5  . aspirin 325 MG EC tablet Take 325 mg by mouth once daily.    .Marland Kitchenatenolol (TENORMIN) 50 MG tablet Take 1 tablet (50 mg total) by mouth once daily. 90 tablet 3  . CALCIUM CITRATE-VITAMIN D2 ORAL Take by mouth 2 (two) times daily.     .Marland KitchenCLARITIN 10 mg tablet Take 10 mg by mouth once daily. As needed     .  FLUoxetine (PROZAC) 40 MG capsule Take 1 capsule (40 mg total) by mouth once daily. 90 capsule 3  . hydroCHLOROthiazide (HYDRODIURIL) 25 MG tablet TAKE 1/2 TO 1 TABLET ONCE A DAY. 30 tablet 6  . lansoprazole (PREVACID) 30 MG DR capsule TAKE ONE (1) CAPSULE EACH DAY 30 capsule 11  . magnesium 250 mg Tab Take by mouth. One by mouth daily    . simvastatin (ZOCOR) 20 MG tablet Take 1 tablet (20 mg total) by mouth nightly. 90 tablet 3  . telmisartan (MICARDIS) 80 MG tablet Take 0.5 tablets (40 mg total) by  mouth once daily. 30 tablet 11  . vit C/E/Zn/coppr/lutein/zeaxan (PRESERVISION AREDS 2 ORAL) Take by mouth. One twice a day     No current facility-administered medications for this visit.       ALLERGIES: Bisphosphonates; Fosamax [alendronate]; and Sulfa (sulfonamide antibiotics)  PAST MEDICAL HISTORY:     Past Medical History:  Diagnosis Date  . Anxiety   . Diverticulosis   . Fibrocystic breast disease   . History of adenomatous polyp of colon 12/06/2015  . Hyperlipidemia   . Hypertension   . Mitral valve prolapse   . Osteoporosis, post-menopausal   . Squamous cell carcinoma     PAST SURGICAL HISTORY:      Past Surgical History:  Procedure Laterality Date  . APPENDECTOMY    . CARDIAC CATHETERIZATION     Revealed normal left system.  There appeared to be a small non dominant right coronary artery which was probably occluded proximally.  Ejection fraction was normal.  . COLON SURGERY    . COLONOSCOPY  03/12/2012   04/29/2010, 08/23/2001, 07/14/1998, 02/07/1991; Adenomatous Polyps, [facility] (Sister/Brother): CBF 02/2017  . COLONOSCOPY  02/21/2016   Adenomatous Polyps, [facility] (Sister/Brother): CBF 01/2019  . EGD  01/09/2012   03/15/1990  . HYSTERECTOMY    . HYSTERECTOMY VAGINAL    . OOPHORECTOMY    . POLYPECTOMY       FAMILY HISTORY:      Family History  Problem Relation Age of Onset  . Emphysema Mother   . Lung disease Mother   . Diabetes type II Mother   . Heart failure Mother   . Emphysema Father   . Myocardial Infarction (Heart attack) Father   . Diabetes type II Father   . COPD Father   . Colon cancer Sister   . COPD Brother   . Lung cancer Brother   . Colon cancer Brother   . No Known Problems Sister   . Heart disease Sister   . Stomach cancer Maternal Aunt   . Colon cancer Paternal Grandfather      SOCIAL HISTORY: Social History        Social History  . Marital status: Married    Spouse name:  N/A  . Number of children: N/A  . Years of education: N/A         Social History Main Topics  . Smoking status: Former Smoker    Types: Cigarettes    Quit date: 10/17/1987  . Smokeless tobacco: Never Used  . Alcohol use No  . Drug use: No  . Sexual activity: Yes     Comment: Married       Other Topics Concern  . None      Social History Narrative  . None    PHYSICAL EXAM: Vitals:   12/20/16 1324  BP: 112/63  Pulse: 68  Temp: 36.6 C (97.8 F)   Body mass index is 22.32 kg/m.  Weight: 57.2 kg (126 lb)   General Appearance:    Alert, cooperative, no distress, appears stated age  Head:     Atraumatic, normocephalic  Eyes:   Anciteric, no erythema, no secretions  Neck:   Supple, symmetrical, no JVD, no palpable lymph nodes  Mouth:   Lips, mucosa, and tongue normal;   Lungs:     Clear to auscultation bilaterally, respirations unlabored   Heart:    Regular rate and rhythm, S1 and S2 normal, no murmur, rub   or gallop  Abdomen:     Soft, non-tender, bowel sounds active all four quadrants,    no masses, no organomegaly  Extremities:   Extremities normal, atraumatic, no cyanosis or edema  Skin:   Skin color, texture, turgor normal, no rashes or lesions   Neurologic: Breast:    Grossly intact. Bilateral breast examined. No palpable mass, no palpable nodule on axilla. No skin changes, no nipple retraction or secretions.     REVIEW OF DATA: I have reviewed the following data today:      Appointment on 11/21/2016  Component Date Value  . WBC (White Blood Cell Co* 11/21/2016 6.4   . RBC (Red Blood Cell Coun* 11/21/2016 4.39   . Hemoglobin 11/21/2016 13.3   . Hematocrit 11/21/2016 39.3   . MCV (Mean Corpuscular Vo* 11/21/2016 89.5   . MCH (Mean Corpuscular He* 11/21/2016 30.3   . MCHC (Mean Corpuscular H* 11/21/2016 33.8   . Platelet Count 11/21/2016 225   . RDW-CV (Red Cell Distrib* 11/21/2016 12.6   . MPV (Mean Platelet Volum* 11/21/2016 10.2   .  Neutrophils 11/21/2016 3.68   . Lymphocytes 11/21/2016 1.84   . Monocytes 11/21/2016 0.56   . Eosinophils 11/21/2016 0.30   . Basophils 11/21/2016 0.04   . Neutrophil % 11/21/2016 57.2   . Lymphocyte % 11/21/2016 28.6   . Monocyte % 11/21/2016 8.7   . Eosinophil % 11/21/2016 4.7   . Basophil% 11/21/2016 0.6   . Immature Granulocyte % 11/21/2016 0.2   . Immature Granulocyte Cou* 11/21/2016 0.01   . Glucose 11/21/2016 110   . Sodium 11/21/2016 141   . Potassium 11/21/2016 4.2   . Chloride 11/21/2016 101   . Carbon Dioxide (CO2) 11/21/2016 31.4   . Urea Nitrogen (BUN) 11/21/2016 12   . Creatinine 11/21/2016 0.7   . Glomerular Filtration Ra* 11/21/2016 81   . Calcium 11/21/2016 9.3   . AST  11/21/2016 23   . ALT  11/21/2016 24   . Alk Phos (alkaline Phosp* 11/21/2016 61   . Albumin 11/21/2016 4.0   . Bilirubin, Total 11/21/2016 0.5   . Protein, Total 11/21/2016 6.6   . A/G Ratio 11/21/2016 1.5   . Magnesium 11/21/2016 2.1   . Color 11/21/2016 Yellow   . Clarity 11/21/2016 Clear   . Specific Gravity 11/21/2016 1.010   . pH, Urine 11/21/2016 7.0   . Protein, Urinalysis 11/21/2016 Negative   . Glucose, Urinalysis 11/21/2016 Negative   . Ketones, Urinalysis 11/21/2016 Negative   . Blood, Urinalysis 11/21/2016 Negative   . Nitrite, Urinalysis 11/21/2016 Negative   . Leukocyte Esterase, Urin* 11/21/2016 Negative   . White Blood Cells, Urina* 11/21/2016 0-3   . Red Blood Cells, Urinaly* 11/21/2016 0-3   . Bacteria, Urinalysis 11/21/2016 Few*  . Squamous Epithelial Cell* 11/21/2016 Rare   . Cholesterol, Total 11/21/2016 189   . Triglyceride 11/21/2016 196   . HDL (High Density Lipopr* 11/21/2016 45.5   . LDL (Low Density Lipopro*  11/21/2016 104   . VLDL Cholesterol 11/21/2016 39   . Cholesterol/HDL Ratio 11/21/2016 4.2   . Thyroid Stimulating Horm* 11/21/2016 1.898      ASSESSMENT: Ms. Polanco is a 76 y.o. female presenting for consultation for malignancy of left breast.  Patient currently found with inner lower quadrant left breast lesion ~1.2 cm with associated microcalcification that were biopsied and showed invasive mammary carcinoma. Patient has no clinically palpable axillary lymph nodes. ER/PR negative, HER 2 neu positive.     PLAN: 1. Internal Medicine Clearance 2. Needle guided partial mastectomy with sentinel lymph node biopsy 3. Port a Cath insertion  Patient and husband verbalized understanding, all questions were answered, and were agreeable with the plan outlined above.     Herbert Pun, MD  Electronically signed by Herbert Pun, MD

## 2016-12-22 ENCOUNTER — Telehealth: Payer: Self-pay | Admitting: *Deleted

## 2016-12-22 NOTE — Telephone Encounter (Signed)
Called the surgeon office to see if the patient could have port placed when she has breast surgery because pt will need chemo after surgery.  Secretary spoke to nurse for Dr. Peyton Najjar and they will add the port on for same day.  I sent order for port to their office

## 2016-12-25 ENCOUNTER — Other Ambulatory Visit: Payer: Self-pay | Admitting: General Surgery

## 2016-12-25 ENCOUNTER — Encounter
Admission: RE | Admit: 2016-12-25 | Discharge: 2016-12-25 | Disposition: A | Discharge status: Home / Self Care | Payer: Medicare HMO | Source: Ambulatory Visit | Attending: General Surgery | Admitting: General Surgery

## 2016-12-25 DIAGNOSIS — C50312 Malignant neoplasm of lower-inner quadrant of left female breast: Secondary | ICD-10-CM

## 2016-12-25 DIAGNOSIS — R928 Other abnormal and inconclusive findings on diagnostic imaging of breast: Secondary | ICD-10-CM | POA: Diagnosis not present

## 2016-12-25 DIAGNOSIS — Z23 Encounter for immunization: Secondary | ICD-10-CM | POA: Diagnosis not present

## 2016-12-25 DIAGNOSIS — Z171 Estrogen receptor negative status [ER-]: Principal | ICD-10-CM

## 2016-12-25 HISTORY — DX: Dyspnea, unspecified: R06.00

## 2016-12-25 HISTORY — DX: Cardiac arrhythmia, unspecified: I49.9

## 2016-12-25 NOTE — Pre-Procedure Instructions (Signed)
Component Name Value Ref Range  LV Ejection Fraction (%) 50   Aortic Valve Stenosis Grade none   Aortic Valve Regurgitation Grade mild   Aortic Valve Max Velocity (m/s) 1.9 m/sec   Mitral Valve Stenosis Grade none   Mitral Valve Regurgitation Grade moderate   Tricuspid Valve Regurgitation Grade trivial   LV End Diastolic Diameter (cm) 4.9 cm  LV End Systolic Diameter (cm) 3.8 cm  LV Septum Wall Thickness (cm) 0.96 cm  LV Posterior Wall Thickness (cm) 0.95 cm  Left Atrium Diameter (cm) 4.6 cm  Result Narrative   CARDIOLOGY DEPARTMENT MARIGNY, BORRE [id number] [facility] Acct #: [id number] [address] [place], [place] [address] Date: 06/14/2016 08:05 AM  Adult Female Age: 76 yrs  ECHOCARDIOGRAM REPORT Outpatient  KC::KCWC STUDY:CHEST WALL TAPE: MD1:FATH, KENNETH ALAN  ECHO:Yes DOPPLER:YesFILE: BP: 136/68 mmHg COLOR:YesCONTRAST:NoMACHINE:PhilipsHeight: 70 in RV BIOPSY:No 3D:No SOUND QLTY:Moderate Weight: 124 lb  MEDIUM:None BSA: 1.6 m2  ___________________________________________________________________________________________ HISTORY:DOE  REASON:Assess, LV function  INDICATION:Mitral valve prolapse [I34.1 (ICD-10-CM)] ___________________________________________________________________________________________  ECHOCARDIOGRAPHIC MEASUREMENTS 2D DIMENSIONS AORTA ValuesNormal RangeMAIN PAValuesNormal Range Annulus:1.8 cm[2.1 -  2.5]PA Main:nm* [1.5 - 2.1] Aorta Sin:2.4 cm[2.7 - 3.3] RIGHT VENTRICLE ST Junction:nm* [2.3 - 2.9]RV Base:nm* [ < 4.2] Asc.Aorta:nm* [2.3 - 3.1] RV Mid:nm* [ < 3.5]  LEFT VENTRICLERV Length:2.1 cm[ < 8.6] LVIDd:4.9 cm[3.9 - 5.3] INFERIOR VENA CAVA LVIDs:3.8 cmMax. IVC:nm* [ <= 2.1]  FS:23.1 %[> 25]Min. IVC:nm* SWT:0.96 cm [0.5 - 0.9] ------------------ PWT:0.95 cm [0.5 - 0.9] nm* - not measured  LEFT ATRIUM LA Diam:4.6 cm[2.7 - 3.8] LA A4C Area:nm* [ < 20] LA Volume:nm* [22 - 52] ___________________________________________________________________________________________  ECHOCARDIOGRAPHIC DESCRIPTIONS  AORTIC ROOT Size:Normal Dissection:INDETERM FOR DISSECTION  AORTIC VALVE Leaflets:TricuspidMorphology:Normal Mobility:Fully mobile  LEFT VENTRICLE Size:Normal Anterior:Normal  Contraction:NormalLateral:Normal Closest EF:50% (Estimated)Septal:Normal  LV Masses:No MassesApical:Normal  TMH:DQQI Inferior:Normal  Posterior:Normal Dias.FxClass:N/A  MITRAL VALVE Leaflets:Normal Mobility:Fully mobile Morphology:Normal  LEFT ATRIUM Size:MILDLY ENLARGED LA Masses:No masses  IA Septum:Normal IAS  MAIN PA Size:Normal  PULMONIC VALVE Morphology:Normal Mobility:Fully mobile  RIGHT VENTRICLE  RV Masses:No MassesSize:Normal  Free Wall:NormalContraction:Normal  TRICUSPID  VALVE Leaflets:Normal Mobility:Fully mobile Morphology:Normal  RIGHT ATRIUM Size:Normal RA Other:None  RA Mass:No masses  PERICARDIUM  Fluid:No effusion  INFERIOR VENACAVA Size:Normal Normal respiratory collapse  _____________________________________________________________________  DOPPLER ECHO and OTHER SPECIAL PROCEDURES  Aortic:MILD ARNo AS 188.6 cm/sec peak vel14.2 mmHg peak grad   Mitral:MODERATE MRNo MS  5.8 cm^2 by DOPPLER MV Inflow E Vel=141.0 cm/sec MV Annulus E'Vel=6.6 cm/sec E/E'Ratio=21.4  Tricuspid:TRIVIAL TR No TS  Pulmonary:MILD PRNo PS 89.6 cm/sec peak vel 3.2 mmHg peak grad    ___________________________________________________________________________________________  INTERPRETATION NORMAL LEFT VENTRICULAR SYSTOLIC FUNCTION NORMAL RIGHT VENTRICULAR SYSTOLIC FUNCTION MODERATE VALVULAR REGURGITATION (See above) NO VALVULAR STENOSIS EF 50% Closest EF: 50% (Estimated) Aortic: MILD AR Mitral: MODERATE MR Tricuspid: TRIVIAL TR  ___________________________________________________________________________________________  Electronically signed by: MD Jordan Hawks on 06/14/2016 02:37 PM Performed By: Maurilio Lovely, RDCS Ordering Physician: Bartholome Bill ___________________________________________________________________________________________  Status Results Details    Appointment on 06/14/2016 [place]")' [email or web address]/">Encounter Summary

## 2016-12-25 NOTE — Patient Instructions (Signed)
Your procedure is scheduled on: 01/01/17 Mon Report to Same Day Surgery 2nd floor medical mall [facility] Entrance-take elevator on left to 2nd floor.  Check in with surgery information desk.) To find out your arrival time please call [phone] between 1PM - 3PM on 12/29/16 Fri  Remember: Instructions that are not followed completely may result in serious medical risk, up to and including death, or upon the discretion of your surgeon and anesthesiologist your surgery may need to be rescheduled.    _x___ 1. Do not eat food after midnight the night before your procedure. You may drink clear liquids up to 2 hours before you are scheduled to arrive at the hospital for your procedure.  Do not drink clear liquids within 2 hours of your scheduled arrival to the hospital.  Clear liquids include  --Water or Apple juice without pulp  --Clear carbohydrate beverage such as ClearFast or Gatorade  --Black Coffee or Clear Tea (No milk, no creamers, do not add anything to                  the coffee or Tea Type 1 and type 2 diabetics should only drink water.  No gum chewing or hard candies.     __x__ 2. No Alcohol for 24 hours before or after surgery.   __x__3. No Smoking for 24 prior to surgery.   ____  4. Bring all medications with you on the day of surgery if instructed.    __x__ 5. Notify your doctor if there is any change in your medical condition     (cold, fever, infections).     Do not wear jewelry, make-up, hairpins, clips or nail polish.  Do not wear lotions, powders, or perfumes. You may wear deodorant.  Do not shave 48 hours prior to surgery. Men may shave face and neck.  Do not bring valuables to the hospital.    [facility] is not responsible for any belongings or valuables.               Contacts, dentures or bridgework may not be worn into surgery.  Leave your suitcase in the car. After surgery it may be brought to your room.  For patients admitted to the hospital, discharge  time is determined by your                       treatment team.   Patients discharged the day of surgery will not be allowed to drive home.  You will need someone to drive you home and stay with you the night of your procedure.    Please read over the following fact sheets that you were given:   [facility] Preparing for Surgery and or MRSA Information   _x___ Take anti-hypertensive listed below, cardiac, seizure, asthma,     anti-reflux and psychiatric medicines. These include:  1. atenolol (TENORMIN) 50 MG tablet  2.lansoprazole (PREVACID) 30 MG capsule  3.telmisartan (MICARDIS) 80 MG tablet  4.  5.  6.  ____Fleets enema or Magnesium Citrate as directed.   _x___ Use CHG Soap or sage wipes as directed on instruction sheet   ____ Use inhalers on the day of surgery and bring to hospital day of surgery  ____ Stop Metformin and Janumet 2 days prior to surgery.    ____ Take 1/2 of usual insulin dose the night before surgery and none on the morning     surgery.   _x___ Follow recommendations from Cardiologist,  Pulmonologist or PCP regarding          stopping Aspirin, Coumadin, Plavix ,Eliquis, Effient, or Pradaxa, and Pletal.  X____Stop Anti-inflammatories such as Advil, Aleve, Ibuprofen, Motrin, Naproxen, Naprosyn, Goodies powders or aspirin products. OK to take Tylenol and                          Celebrex.   _x___ Stop supplements until after surgery.  But may continue Vitamin D, Vitamin B,       and multivitamin.   ____ Bring C-Pap to the hospital.

## 2016-12-27 ENCOUNTER — Ambulatory Visit
Admission: RE | Admit: 2016-12-27 | Discharge: 2016-12-27 | Disposition: A | Discharge status: Home / Self Care | Payer: Medicare HMO | Source: Ambulatory Visit | Attending: Oncology | Admitting: Oncology

## 2016-12-27 DIAGNOSIS — C50919 Malignant neoplasm of unspecified site of unspecified female breast: Secondary | ICD-10-CM | POA: Diagnosis not present

## 2016-12-27 DIAGNOSIS — Z171 Estrogen receptor negative status [ER-]: Secondary | ICD-10-CM | POA: Insufficient documentation

## 2016-12-27 DIAGNOSIS — C50312 Malignant neoplasm of lower-inner quadrant of left female breast: Secondary | ICD-10-CM | POA: Diagnosis not present

## 2016-12-27 MED ORDER — TECHNETIUM TC 99M-LABELED RED BLOOD CELLS IV KIT
22.0000 | PACK | Freq: Once | INTRAVENOUS | Status: AC | PRN
  Administered 2016-12-27: 22 via INTRAVENOUS

## 2016-12-28 ENCOUNTER — Other Ambulatory Visit: Payer: Self-pay | Admitting: *Deleted

## 2016-12-28 ENCOUNTER — Encounter: Payer: Self-pay | Admitting: *Deleted

## 2016-12-28 NOTE — Progress Notes (Signed)
  Oncology Nurse Navigator Documentation  Navigator Location: [facility] (12/28/16 1000)   )Navigator Encounter Type: Introductory phone call (12/28/16 1000)   Abnormal Finding Date: 12/07/16 (12/28/16 1000) Confirmed Diagnosis Date: 12/15/16 (12/28/16 1000) Surgery Date: 01/01/17 (12/28/16 1000)                 Barriers/Navigation Needs: Education (12/28/16 1000) Education: Newly Diagnosed Cancer Education (12/28/16 1000)          Support Groups/Services: Breast Support Group (12/28/16 1000)             Time Spent with Patient: 61 (12/28/16 1000)   Called patient to establish navigation services.  She has already met with Dr. Janese Banks last week and is scheduled for surgery on Monday, October 8th.  Reviewed plan of care per Dr. Elroy Channel notes and answered questions.  Will leave educational  literature, "My Breast Cancer Treatment Handbook" by Josephine Igo, RN at the breast center for the patient to pick up on Monday.  She is agreeable to the plan.  She is to call with any questions or needs.

## 2016-12-31 MED ORDER — CEFAZOLIN SODIUM-DEXTROSE 2-4 GM/100ML-% IV SOLN
2.0000 g | INTRAVENOUS | Status: AC
  Administered 2017-01-01: 2 g via INTRAVENOUS

## 2017-01-01 ENCOUNTER — Ambulatory Visit
Admission: RE | Admit: 2017-01-01 | Discharge: 2017-01-01 | Disposition: A | Discharge status: Home / Self Care | Payer: Medicare HMO | Source: Ambulatory Visit | Attending: General Surgery | Admitting: General Surgery

## 2017-01-01 ENCOUNTER — Encounter: Payer: Self-pay | Admitting: *Deleted

## 2017-01-01 ENCOUNTER — Ambulatory Visit: Payer: Medicare HMO | Admitting: Anesthesiology

## 2017-01-01 ENCOUNTER — Ambulatory Visit: Payer: Medicare HMO

## 2017-01-01 ENCOUNTER — Encounter
Admission: RE | Disposition: A | Discharge status: Home / Self Care | Payer: Self-pay | Source: Ambulatory Visit | Attending: General Surgery

## 2017-01-01 DIAGNOSIS — I10 Essential (primary) hypertension: Secondary | ICD-10-CM | POA: Insufficient documentation

## 2017-01-01 DIAGNOSIS — Z86010 Personal history of colonic polyps: Secondary | ICD-10-CM | POA: Insufficient documentation

## 2017-01-01 DIAGNOSIS — C50312 Malignant neoplasm of lower-inner quadrant of left female breast: Secondary | ICD-10-CM

## 2017-01-01 DIAGNOSIS — K589 Irritable bowel syndrome without diarrhea: Secondary | ICD-10-CM | POA: Diagnosis not present

## 2017-01-01 DIAGNOSIS — I341 Nonrheumatic mitral (valve) prolapse: Secondary | ICD-10-CM | POA: Insufficient documentation

## 2017-01-01 DIAGNOSIS — Z87891 Personal history of nicotine dependence: Secondary | ICD-10-CM | POA: Diagnosis not present

## 2017-01-01 DIAGNOSIS — Z882 Allergy status to sulfonamides status: Secondary | ICD-10-CM | POA: Insufficient documentation

## 2017-01-01 DIAGNOSIS — Z8589 Personal history of malignant neoplasm of other organs and systems: Secondary | ICD-10-CM | POA: Diagnosis not present

## 2017-01-01 DIAGNOSIS — Z79899 Other long term (current) drug therapy: Secondary | ICD-10-CM | POA: Diagnosis not present

## 2017-01-01 DIAGNOSIS — J45909 Unspecified asthma, uncomplicated: Secondary | ICD-10-CM | POA: Diagnosis not present

## 2017-01-01 DIAGNOSIS — Z171 Estrogen receptor negative status [ER-]: Secondary | ICD-10-CM | POA: Insufficient documentation

## 2017-01-01 DIAGNOSIS — K219 Gastro-esophageal reflux disease without esophagitis: Secondary | ICD-10-CM | POA: Insufficient documentation

## 2017-01-01 DIAGNOSIS — I2510 Atherosclerotic heart disease of native coronary artery without angina pectoris: Secondary | ICD-10-CM | POA: Insufficient documentation

## 2017-01-01 DIAGNOSIS — F419 Anxiety disorder, unspecified: Secondary | ICD-10-CM | POA: Diagnosis not present

## 2017-01-01 DIAGNOSIS — Z8719 Personal history of other diseases of the digestive system: Secondary | ICD-10-CM | POA: Insufficient documentation

## 2017-01-01 DIAGNOSIS — Z7982 Long term (current) use of aspirin: Secondary | ICD-10-CM | POA: Insufficient documentation

## 2017-01-01 DIAGNOSIS — C50912 Malignant neoplasm of unspecified site of left female breast: Secondary | ICD-10-CM | POA: Diagnosis not present

## 2017-01-01 DIAGNOSIS — Z888 Allergy status to other drugs, medicaments and biological substances status: Secondary | ICD-10-CM | POA: Insufficient documentation

## 2017-01-01 DIAGNOSIS — Z452 Encounter for adjustment and management of vascular access device: Secondary | ICD-10-CM | POA: Diagnosis not present

## 2017-01-01 DIAGNOSIS — M810 Age-related osteoporosis without current pathological fracture: Secondary | ICD-10-CM | POA: Insufficient documentation

## 2017-01-01 DIAGNOSIS — E782 Mixed hyperlipidemia: Secondary | ICD-10-CM | POA: Insufficient documentation

## 2017-01-01 HISTORY — PX: PARTIAL MASTECTOMY WITH NEEDLE LOCALIZATION: SHX6008

## 2017-01-01 HISTORY — PX: SENTINEL NODE BIOPSY: SHX6608

## 2017-01-01 HISTORY — PX: BREAST LUMPECTOMY: SHX2

## 2017-01-01 HISTORY — PX: PORTACATH PLACEMENT: SHX2246

## 2017-01-01 SURGERY — PARTIAL MASTECTOMY WITH NEEDLE LOCALIZATION
Anesthesia: General | Site: Chest | Laterality: Right

## 2017-01-01 MED ORDER — LIDOCAINE HCL (PF) 2 % IJ SOLN
INTRAMUSCULAR | Status: AC
  Filled 2017-01-01: qty 10

## 2017-01-01 MED ORDER — ONDANSETRON HCL 4 MG/2ML IJ SOLN
INTRAMUSCULAR | Status: AC
  Filled 2017-01-01: qty 2

## 2017-01-01 MED ORDER — FENTANYL CITRATE (PF) 100 MCG/2ML IJ SOLN
INTRAMUSCULAR | Status: DC | PRN
  Administered 2017-01-01 (×5): 25 ug via INTRAVENOUS

## 2017-01-01 MED ORDER — ONDANSETRON HCL 4 MG/2ML IJ SOLN
INTRAMUSCULAR | Status: DC | PRN
  Administered 2017-01-01: 4 mg via INTRAVENOUS

## 2017-01-01 MED ORDER — TRAMADOL HCL 50 MG PO TABS: 50.0000 mg | ORAL_TABLET | Freq: Four times a day (QID) | ORAL | 0 refills | Status: DC | PRN

## 2017-01-01 MED ORDER — MIDAZOLAM HCL 2 MG/2ML IJ SOLN
INTRAMUSCULAR | Status: DC | PRN
  Administered 2017-01-01: 2 mg via INTRAVENOUS

## 2017-01-01 MED ORDER — LIDOCAINE HCL (PF) 1 % IJ SOLN
INTRAMUSCULAR | Status: DC | PRN
  Administered 2017-01-01: 7 mL

## 2017-01-01 MED ORDER — ONDANSETRON HCL 4 MG/2ML IJ SOLN: 4.0000 mg | Freq: Once | INTRAMUSCULAR | Status: DC | PRN

## 2017-01-01 MED ORDER — LIDOCAINE HCL (CARDIAC) 20 MG/ML IV SOLN
INTRAVENOUS | Status: DC | PRN
  Administered 2017-01-01: 100 mg via INTRAVENOUS

## 2017-01-01 MED ORDER — FENTANYL CITRATE (PF) 100 MCG/2ML IJ SOLN
25.0000 ug | INTRAMUSCULAR | Status: DC | PRN
  Administered 2017-01-01 (×2): 25 ug via INTRAVENOUS

## 2017-01-01 MED ORDER — HEPARIN SODIUM (PORCINE) 5000 UNIT/ML IJ SOLN
INTRAMUSCULAR | Status: AC
  Filled 2017-01-01: qty 1

## 2017-01-01 MED ORDER — CEFAZOLIN SODIUM-DEXTROSE 2-4 GM/100ML-% IV SOLN
INTRAVENOUS | Status: AC
Start: 2017-01-01 — End: 2017-01-01
  Filled 2017-01-01: qty 100

## 2017-01-01 MED ORDER — PROPOFOL 10 MG/ML IV BOLUS
INTRAVENOUS | Status: DC | PRN
  Administered 2017-01-01: 110 mg via INTRAVENOUS

## 2017-01-01 MED ORDER — SODIUM CHLORIDE 0.9 % IJ SOLN
INTRAMUSCULAR | Status: AC
  Filled 2017-01-01: qty 50

## 2017-01-01 MED ORDER — LIDOCAINE HCL (PF) 1 % IJ SOLN
INTRAMUSCULAR | Status: AC
  Filled 2017-01-01: qty 30

## 2017-01-01 MED ORDER — BUPIVACAINE HCL (PF) 0.5 % IJ SOLN
INTRAMUSCULAR | Status: DC | PRN
  Administered 2017-01-01: 20 mL

## 2017-01-01 MED ORDER — MIDAZOLAM HCL 2 MG/2ML IJ SOLN
INTRAMUSCULAR | Status: AC
  Filled 2017-01-01: qty 2

## 2017-01-01 MED ORDER — TECHNETIUM TC 99M SULFUR COLLOID FILTERED
1.0000 | Freq: Once | INTRAVENOUS | Status: AC | PRN
  Administered 2017-01-01: 0.732 via INTRADERMAL

## 2017-01-01 MED ORDER — TRAMADOL HCL 50 MG PO TABS
50.0000 mg | ORAL_TABLET | Freq: Four times a day (QID) | ORAL | Status: DC
  Administered 2017-01-01: 50 mg via ORAL

## 2017-01-01 MED ORDER — PHENYLEPHRINE HCL 10 MG/ML IJ SOLN
INTRAMUSCULAR | Status: AC
  Filled 2017-01-01: qty 1

## 2017-01-01 MED ORDER — FENTANYL CITRATE (PF) 100 MCG/2ML IJ SOLN
INTRAMUSCULAR | Status: AC
  Administered 2017-01-01: 25 ug via INTRAVENOUS
  Filled 2017-01-01: qty 2

## 2017-01-01 MED ORDER — BUPIVACAINE HCL (PF) 0.5 % IJ SOLN
INTRAMUSCULAR | Status: AC
  Filled 2017-01-01: qty 30

## 2017-01-01 MED ORDER — PHENYLEPHRINE HCL 10 MG/ML IJ SOLN
INTRAMUSCULAR | Status: DC | PRN
  Administered 2017-01-01 (×3): 200 ug via INTRAVENOUS
  Administered 2017-01-01 (×3): 100 ug via INTRAVENOUS
  Administered 2017-01-01: 200 ug via INTRAVENOUS
  Administered 2017-01-01: 100 ug via INTRAVENOUS

## 2017-01-01 MED ORDER — LACTATED RINGERS IV SOLN
INTRAVENOUS | Status: DC
  Administered 2017-01-01 (×2): via INTRAVENOUS

## 2017-01-01 MED ORDER — SODIUM CHLORIDE 0.9 % IV SOLN
INTRAVENOUS | Status: DC | PRN
  Administered 2017-01-01: 10 mL via INTRAMUSCULAR

## 2017-01-01 MED ORDER — BUPIVACAINE-EPINEPHRINE (PF) 0.5% -1:200000 IJ SOLN
INTRAMUSCULAR | Status: AC
  Filled 2017-01-01: qty 30

## 2017-01-01 MED ORDER — PROPOFOL 10 MG/ML IV BOLUS
INTRAVENOUS | Status: AC
  Filled 2017-01-01: qty 20

## 2017-01-01 MED ORDER — FENTANYL CITRATE (PF) 250 MCG/5ML IJ SOLN
INTRAMUSCULAR | Status: AC
  Filled 2017-01-01: qty 5

## 2017-01-01 MED ORDER — TRAMADOL HCL 50 MG PO TABS
ORAL_TABLET | ORAL | Status: AC
  Administered 2017-01-01: 50 mg via ORAL
  Filled 2017-01-01: qty 1

## 2017-01-01 SURGICAL SUPPLY — 58 items
ADH SKN CLS APL DERMABOND .7 (GAUZE/BANDAGES/DRESSINGS) ×2
BLADE SURG 15 STRL LF DISP TIS (BLADE) ×2 IMPLANT
BLADE SURG 15 STRL SS (BLADE) ×4
CANISTER SUCT 1200ML W/VALVE (MISCELLANEOUS) ×4 IMPLANT
CHLORAPREP W/TINT 26ML (MISCELLANEOUS) ×6 IMPLANT
CNTNR SPEC 2.5X3XGRAD LEK (MISCELLANEOUS) ×4
CONT SPEC 4OZ STER OR WHT (MISCELLANEOUS) ×4
CONT SPEC 4OZ STRL OR WHT (MISCELLANEOUS) ×4
CONTAINER SPEC 2.5X3XGRAD LEK (MISCELLANEOUS) IMPLANT
COVER LIGHT HANDLE STERIS (MISCELLANEOUS) ×6 IMPLANT
COVER TRANSDUCER ULTRASOUND (MISCELLANEOUS) ×4 IMPLANT
DECANTER SPIKE VIAL GLASS SM (MISCELLANEOUS) ×6 IMPLANT
DERMABOND ADVANCED (GAUZE/BANDAGES/DRESSINGS) ×2
DERMABOND ADVANCED .7 DNX12 (GAUZE/BANDAGES/DRESSINGS) ×2 IMPLANT
DEVICE DUBIN SPECIMEN MAMMOGRA (MISCELLANEOUS) ×4 IMPLANT
DRAPE C-ARM XRAY 36X54 (DRAPES) ×4 IMPLANT
DRAPE LAPAROTOMY 77X122 PED (DRAPES) ×2 IMPLANT
DRAPE LAPAROTOMY TRNSV 106X77 (MISCELLANEOUS) ×2 IMPLANT
DRSG TEGADERM 2-3/8X2-3/4 SM (GAUZE/BANDAGES/DRESSINGS) ×4 IMPLANT
ELECT REM PT RETURN 9FT ADLT (ELECTROSURGICAL) ×4
ELECTRODE REM PT RTRN 9FT ADLT (ELECTROSURGICAL) ×2 IMPLANT
GAUZE SPONGE NON-WVN 2X2 STRL (MISCELLANEOUS) IMPLANT
GLOVE BIO SURGEON STRL SZ 6.5 (GLOVE) ×4 IMPLANT
GLOVE BIO SURGEONS STRL SZ 6.5 (GLOVE) ×2
GLOVE BIOGEL PI IND STRL 6.5 (GLOVE) IMPLANT
GLOVE BIOGEL PI INDICATOR 6.5 (GLOVE) ×2
GLOVE SKINSENSE NS SZ6.5 (GLOVE) ×2
GLOVE SKINSENSE STRL SZ6.5 (GLOVE) IMPLANT
GOWN STRL REUS W/ TWL LRG LVL3 (GOWN DISPOSABLE) ×6 IMPLANT
GOWN STRL REUS W/TWL LRG LVL3 (GOWN DISPOSABLE) ×12
KIT PORT POWER 8FR ISP CVUE (Miscellaneous) ×4 IMPLANT
KIT RM TURNOVER STRD PROC AR (KITS) ×4 IMPLANT
LABEL OR SOLS (LABEL) ×4 IMPLANT
MARGIN MAP 10MM (MISCELLANEOUS) ×4 IMPLANT
NDL FILTER BLUNT 18X1 1/2 (NEEDLE) ×2 IMPLANT
NDL HYPO 25X1 1.5 SAFETY (NEEDLE) ×2 IMPLANT
NEEDLE FILTER BLUNT 18X 1/2SAF (NEEDLE) ×2
NEEDLE FILTER BLUNT 18X1 1/2 (NEEDLE) ×2 IMPLANT
NEEDLE HYPO 25X1 1.5 SAFETY (NEEDLE) ×4 IMPLANT
PACK BASIN MINOR ARMC (MISCELLANEOUS) ×2 IMPLANT
PACK PORT-A-CATH (MISCELLANEOUS) ×2 IMPLANT
SPONGE VERSALON 2X2 STRL (MISCELLANEOUS) ×8
SPONGE XRAY 4X4 16PLY STRL (MISCELLANEOUS) ×2 IMPLANT
SUT CHROMIC 3 0 SH 27 (SUTURE) ×2 IMPLANT
SUT ETHILON 3-0 FS-10 30 BLK (SUTURE) ×4
SUT MNCRL 4-0 (SUTURE)
SUT MNCRL 4-0 27XMFL (SUTURE)
SUT MNCRL AB 4-0 PS2 18 (SUTURE) ×8 IMPLANT
SUT SILK 4 0 SH (SUTURE) ×4 IMPLANT
SUT VIC AB 2-0 SH 27 (SUTURE)
SUT VIC AB 2-0 SH 27XBRD (SUTURE) ×2 IMPLANT
SUT VIC AB 3-0 SH 27 (SUTURE) ×8
SUT VIC AB 3-0 SH 27X BRD (SUTURE) ×2 IMPLANT
SUTURE EHLN 3-0 FS-10 30 BLK (SUTURE) ×2 IMPLANT
SUTURE MNCRL 4-0 27XMF (SUTURE) ×2 IMPLANT
SYR 3ML LL SCALE MARK (SYRINGE) ×4 IMPLANT
SYRINGE 10CC LL (SYRINGE) ×4 IMPLANT
WATER STERILE IRR 1000ML POUR (IV SOLUTION) ×4 IMPLANT

## 2017-01-01 NOTE — H&P (Signed)
HISTORY OF PRESENT ILLNESS: Doris Barnes is a 76 y.o. female who presents to the Clinic for consultation at the request of Dr. Sabra Heck for evaluation of malignancy of the left breast.  [name] reports that she had her usual screening mammography which showed a suspicious lesion on the inner lower quadrant of the left breast. After complete workup, the lesion showed to be invasive Carcinoma. The patient denies breast pain, palpable mass, nipple changes or discharge and no skin changes. Patient refers first menstrual period was at 76 years old and last was at 76 years of age. Patient refers having 5 pregancies, the first one at 59. Denies chest radiation. Refers one previous biopsy on left breast which is not on chart is patient refers it was found a benign disease.   PROBLEM LIST: Problem List Date Reviewed: 12/20/2016  Noted  Malignant neoplasm of lower-inner quadrant of left female breast (CMS-HCC) 12/20/2016  History of adenomatous polyp of colon 12/06/2015  Hyperlipidemia, mixed 05/21/2015  Essential hypertension 02/26/2015  Coronary artery disease involving native coronary artery of native heart with angina pectoris (CMS-HCC) 01/26/2015  Overview  Cardiac catheterization in 2008 showed a 40% proximal LAD stenosis with normal LV function Cardiac catheterization in 2016 revealed a probable non dominant right coronary artery occlusion. The left system was without significant disease. Ejection fraction normal. Irritable bowel syndrome without diarrhea 02/02/2014  Mitral valve prolapse Unknown  Osteoporosis, post-menopausal Unknown  IV Reclast 3/18, intolerant Vitamin D 55 2017, BD 9/17 Fibrocystic breast disease Unknown  Squamous cell carcinoma Unknown  Migraine headache Unknown   GENERAL REVIEW OF SYSTEMS:   General ROS: negative for - chills, fatigue, fever, weight gain or weight loss Allergy and Immunology ROS: negative for - hives  Hematological and Lymphatic ROS: Positive for - easy  bruising, negative for palpable nodes Endocrine ROS: negative for - heat or cold intolerance, hair changes Respiratory ROS: negative for - cough, shortness of breath or wheezing Cardiovascular ROS: no chest pain. Positive for palpitations GI ROS: negative for nausea, vomiting, abdominal pain, diarrhea, constipation Musculoskeletal ROS: negative for - joint swelling or muscle pain Neurological ROS: negative for - confusion, syncope Dermatological ROS: negative for pruritus and rash  MEDICATIONS: Current Outpatient Prescriptions  Medication Sig Dispense Refill  . ACETAMINOPHEN (TYLENOL ORAL) Take by mouth. '500mg'$  prn  . ALPRAZolam (XANAX) 0.25 MG tablet Take 1 tablet (0.25 mg total) by mouth once daily as needed. Patient takes 1/2 tab bid 30 tablet 5  . aspirin 325 MG EC tablet Take 325 mg by mouth once daily.  Marland Kitchen atenolol (TENORMIN) 50 MG tablet Take 1 tablet (50 mg total) by mouth once daily. 90 tablet 3  . CALCIUM CITRATE-VITAMIN D2 ORAL Take by mouth 2 (two) times daily.  Marland Kitchen CLARITIN 10 mg tablet Take 10 mg by mouth once daily. As needed . FLUoxetine (PROZAC) 40 MG capsule Take 1 capsule (40 mg total) by mouth once daily. 90 capsule 3  . hydroCHLOROthiazide (HYDRODIURIL) 25 MG tablet TAKE 1/2 TO 1 TABLET ONCE A DAY. 30 tablet 6  . lansoprazole (PREVACID) 30 MG DR capsule TAKE ONE (1) CAPSULE EACH DAY 30 capsule 11  . magnesium 250 mg Tab Take by mouth. One by mouth daily  . simvastatin (ZOCOR) 20 MG tablet Take 1 tablet (20 mg total) by mouth nightly. 90 tablet 3  . telmisartan (MICARDIS) 80 MG tablet Take 0.5 tablets (40 mg total) by mouth once daily. 30 tablet 11  . vit C/E/Zn/coppr/lutein/zeaxan (PRESERVISION AREDS 2 ORAL)  Take by mouth. One twice a day   ALLERGIES: Bisphosphonates; Fosamax [alendronate]; and Sulfa (sulfonamide antibiotics)  PAST MEDICAL HISTORY: Past Medical History:  Diagnosis Date  . Anxiety  . Diverticulosis  . Fibrocystic breast disease  . History of  adenomatous polyp of colon 12/06/2015  . Hyperlipidemia  . Hypertension  . Mitral valve prolapse  . Osteoporosis, post-menopausal  . Squamous cell carcinoma   PAST SURGICAL HISTORY: Past Surgical History:  Procedure Laterality Date  . APPENDECTOMY  . CARDIAC CATHETERIZATION  Revealed normal left system. There appeared to be a small non dominant right coronary artery which was probably occluded proximally. Ejection fraction was normal.  . COLON SURGERY  . COLONOSCOPY 03/12/2012  04/29/2010, 08/23/2001, 07/14/1998, 02/07/1991; Adenomatous Polyps, [facility] (Sister/Brother): CBF 02/2017  . COLONOSCOPY 02/21/2016  Adenomatous Polyps, [facility] (Sister/Brother): CBF 01/2019  . EGD 01/09/2012  03/15/1990  . HYSTERECTOMY  . HYSTERECTOMY VAGINAL  . OOPHORECTOMY  . POLYPECTOMY   FAMILY HISTORY: Family History  Problem Relation Age of Onset  . Emphysema Mother  . Lung disease Mother  . Diabetes type II Mother  . Heart failure Mother  . Emphysema Father  . Myocardial Infarction (Heart attack) Father  . Diabetes type II Father  . COPD Father  . Colon cancer Sister  . COPD Brother  . Lung cancer Brother  . Colon cancer Brother  . No Known Problems Sister  . Heart disease Sister  . Stomach cancer Maternal Aunt  . Colon cancer Paternal Grandfather   SOCIAL HISTORY: Social History   Social History  . Marital status: Married  Spouse name: N/A  . Number of children: N/A  . Years of education: N/A   Social History Main Topics  . Smoking status: Former Smoker  Types: Cigarettes  Quit date: 10/17/1987  . Smokeless tobacco: Never Used  . Alcohol use No  . Drug use: No  . Sexual activity: Yes  Comment: Married   Other Topics Concern  . None   Social History Narrative  . None   PHYSICAL EXAM: Vitals:  12/20/16 1324  BP: 112/63  Pulse: 68  Temp: 36.6 C (97.8 F)   Body mass index is 22.32 kg/m. Weight: 57.2 kg (126 lb)   General Appearance: Alert, cooperative, no distress,  appears stated age  Head: Atraumatic, normocephalic  Eyes: Anciteric, no erythema, no secretions  Neck: Supple, symmetrical, no JVD, no palpable lymph nodes  Mouth: Lips, mucosa, and tongue normal;  Lungs: Clear to auscultation bilaterally, respirations unlabored  Heart: Regular rate and rhythm, S1 and S2 normal, no murmur, rub or gallop  Abdomen: Soft, non-tender, bowel sounds active all four quadrants,  no masses, no organomegaly  Extremities: Extremities normal, atraumatic, no cyanosis or edema  Skin: Skin color, texture, turgor normal, no rashes or lesions  Neurologic: Breast: Grossly intact. Bilateral breast examined. No palpable mass, no palpable nodule on axilla. No skin changes, no nipple retraction or secretions.   REVIEW OF DATA: I have reviewed the following data today: Appointment on 11/21/2016  Component Date Value  . WBC (White Blood Cell Co* 11/21/2016 6.4  . RBC (Red Blood Cell Coun* 11/21/2016 4.39  . Hemoglobin 11/21/2016 13.3  . Hematocrit 11/21/2016 39.3  . MCV (Mean Corpuscular Vo* 11/21/2016 89.5  . MCH (Mean Corpuscular He* 11/21/2016 30.3  . MCHC (Mean Corpuscular H* 11/21/2016 33.8  . Platelet Count 11/21/2016 225  . RDW-CV (Red Cell Distrib* 11/21/2016 12.6  . MPV (Mean Platelet Volum* 11/21/2016 10.2  . Neutrophils 11/21/2016 3.68  .  Lymphocytes 11/21/2016 1.84  . Monocytes 11/21/2016 0.56  . Eosinophils 11/21/2016 0.30  . Basophils 11/21/2016 0.04  . Neutrophil % 11/21/2016 57.2  . Lymphocyte % 11/21/2016 28.6  . Monocyte % 11/21/2016 8.7  . Eosinophil % 11/21/2016 4.7  . Basophil% 11/21/2016 0.6  . Immature Granulocyte % 11/21/2016 0.2  . Immature Granulocyte Cou* 11/21/2016 0.01  . Glucose 11/21/2016 110  . Sodium 11/21/2016 141  . Potassium 11/21/2016 4.2  . Chloride 11/21/2016 101  . Carbon Dioxide (CO2) 11/21/2016 31.4  . Urea Nitrogen (BUN) 11/21/2016 12  . Creatinine 11/21/2016 0.7  . Glomerular Filtration Ra* 11/21/2016 81  .  Calcium 11/21/2016 9.3  . AST 11/21/2016 23  . ALT 11/21/2016 24  . Alk Phos (alkaline Phosp* 11/21/2016 61  . Albumin 11/21/2016 4.0  . Bilirubin, Total 11/21/2016 0.5  . Protein, Total 11/21/2016 6.6  . A/G Ratio 11/21/2016 1.5  . Magnesium 11/21/2016 2.1  . Color 11/21/2016 Yellow  . Clarity 11/21/2016 Clear  . Specific Gravity 11/21/2016 1.010  . pH, Urine 11/21/2016 7.0  . Protein, Urinalysis 11/21/2016 Negative  . Glucose, Urinalysis 11/21/2016 Negative  . Ketones, Urinalysis 11/21/2016 Negative  . Blood, Urinalysis 11/21/2016 Negative  . Nitrite, Urinalysis 11/21/2016 Negative  . Leukocyte Esterase, Urin* 11/21/2016 Negative  . White Blood Cells, Urina* 11/21/2016 0-3  . Red Blood Cells, Urinaly* 11/21/2016 0-3  . Bacteria, Urinalysis 11/21/2016 Few*  . Squamous Epithelial Cell* 11/21/2016 Rare  . Cholesterol, Total 11/21/2016 189  . Triglyceride 11/21/2016 196  . HDL (High Density Lipopr* 11/21/2016 45.5  . LDL (Low Density Lipopro* 11/21/2016 104  . VLDL Cholesterol 11/21/2016 39  . Cholesterol/HDL Ratio 11/21/2016 4.2  . Thyroid Stimulating Horm* 11/21/2016 1.898    ASSESSMENT: Ms. Pelle is a 76 y.o. female presenting for consultation for malignancy of left breast. Patient currently found with inner lower quadrant left breast lesion ~1.2 cm with associated microcalcification that were biopsied and showed invasive mammary carcinoma. Patient has no clinically palpable axillary lymph nodes. ER negative, PR positive and HER2 neu positive. Patient will benefit of Needle guided partial mastectomy with sentinel lymph node biopsy and Insertion of Port A Cath. Patient oriented about the surgical procedure, benefits, risks and post operative care.   Patient verbalized understanding, all questions were answered, and were agreeable with the plan outlined above.   Herbert Pun, MD  Electronically signed by Herbert Pun, MD

## 2017-01-01 NOTE — Op Note (Signed)
Preoperative diagnosis: Malignant neoplasm of the inner lower quadrant of the left breast.  Postoperative diagnosis: Malignant neoplasm of the inner lower quadrant of the left breast.   Procedure: Left needle-localized breast lumpectomy.                       Left Axillary Sentinel Lymph node biopsy                      Percutaneous access of Right internal jugular vein under ultrasound guidance                      Insertion of tunneled Right internal jugular central venous catheter with                                  subcutaneous port  Anesthesia: GETA  Surgeon: Dr. Windell Moment  Wound Classification: Clean  Indications: This 76 year old female with a nonpalpable left breast mass noted on mammography with core biopsy demonstrating invasive mammary carcinoma with associated microcalcification who requires needle-localized partial mastectomy for treatment with sentinel lymph node biopsy. Patient needs long there IV access for adjuvant chemotherapy.   Findings: 1. Fluoroscopy show catheter in place 2. Adequate inflow and outflow at the end procedure 3. Specimen mammography show marker on specimen 4. Pathology reported preliminary margins negative, closes at least 47mm negative of the inferolateral margin.  5. Re-excision of the inferolateral margin done as per pathology recommendations.  6. Sentinel Lymph node count 225 7. No palpable lesion of left breast or palpable axillary adenopathy 8. Adequate hemostasis.   Description of procedure: Preoperative needle localization was performed by radiology. In the nuclear medicine suite, the subareolar region was injected with Tc-99 sulfur colloid. Localization studies were reviewed. The patient was taken to the operating room and placed supine on the operating table, and after general anesthesia the right and left chest and axilla were prepped and draped in the usual sterile fashion. A time-out was completed verifying correct patient, procedure,  site, positioning, and implant(s) and/or special equipment prior to beginning this procedure.  Right IJ venous access was obtained under ultrasound guidance using Seldinger technique, by which local anesthetic was injected over the Right IJ vein, and access needle was inserted under direct ultrasound visualization into the Right IJ vein, through which soft guidewire was advanced, over which access needle was withdrawn. Guidewire was secured, attention was directed to injection of local anesthetic along the planned tunnel site, 2-3 cm transverse Right chest incision was made and confirmed to accommodate the subcutaneous port, and flushed catheter was tunneled retrograde from the port site over the Right chest to the Right IJ access site with the attached port well-secured to the catheter and within the subcutaneous pocket. Insertion sheath was advanced over the guidewire, which was withdrawn along with the insertion sheath dilator. Length of catheter needed to position the catheter tip at the atrio-caval junction was then measured under direct fluoroscopic visualization, after which the catheter was cut to the measured length and advanced through the sheath into the Right internal jugular vein and SVC without evidence of cardiac arrhythmias during the procedure. Port was confirmed to withdraw blood and flush easily, after which concentrated heparin was instilled into the port and catheter. Dermis at the subcutaneous pocket was re-approximated using buried interrupted 3-0 Vicryl suture, and 4-0 Monocryl suture was used to re-approximate skin at the  insertion and subcutaneous port sites in running subcuticular fashion for the subcutaneous port and buried interrupted fashion for the insertion site. Skin was cleaned, dried, and sterile skin glue was applied. By comparing the localization studies with the direction and skin entry site of the needle, the probable trajectory and location of the mass was visualized. A  circumareolar skin incision was planned in such a way as to minimize the amount of dissection to reach the mass.  The skin incision was made. Flaps were raised and the location of the wire confirmed. The wire was delivered into the wound. A thyroid clamp used for retraction. Dissection was then taken down circumferentially, taking care to include the entire localizing needle and a wide margin of grossly normal tissue. The specimen and entire localizing wire were removed. The specimen was oriented and sent to radiology with the localization studies. Confirmation was received that the entire target lesion had been resected, with the closest margin being the inferolateral margin whic was reported preliminary as 68mm free of ink. The wound was irrigated. Hemostasis was checked. The wound was closed with interrupted sutures of 3-0 Vicryl and a subcuticular suture of Monocryl 3-0. No attempt was made to close the dead space. A dressing was applied.  A hand-held gamma probe was used to identify the location of the hottest spot in the axilla.  An incision was made around the caudal axillary hairline. Dissection was carried down until subdermal facias was advanced. The probe was placed and again, the point of maximal count was found. Dissection continue until nodule was identified. The probe was placed in contact with the node and 158 counts were recorded. The node was excised in its entirety. Ex vivo, the node measured 225 counts when placed on the probe. The bed of the node measured 21 counts. No additional hot spots were identified. No clinically abnormal nodes were palpated. The procedure was terminated. Hemostasis was achieved and the wound closed in layers with deep interrupted 3-0 Vicryl and skin was closed with subcuticular suture of Monocryl 4-0.  The patient tolerated the procedure well and was taken to the postanesthesia care unit in stable condition. Chest Xray reviewed and no complications seen. Catheter in  correct place.   Specimen: Left Breast mass (oriented with margin maps)                   Sentinel Lymph nodes #1        Inferolateral margin (stitch inside cavity)  Complications: None  Estimated Blood Loss: 97mL  IMPLANTS: 6F tunneled Bard PowerPort central venous catheter with subcutaneous port  DRAINS: None

## 2017-01-01 NOTE — Anesthesia Post-op Follow-up Note (Signed)
Anesthesia QCDR form completed.        

## 2017-01-01 NOTE — Anesthesia Preprocedure Evaluation (Addendum)
Anesthesia Evaluation  Patient identified by MRN, date of birth, ID band Patient awake    Reviewed: Allergy & Precautions, H&P , NPO status , Patient's Chart, lab work & pertinent test results, reviewed documented beta blocker date and time   Airway Mallampati: II  TM Distance: >3 FB Neck ROM: full    Dental  (+) Teeth Intact   Pulmonary neg pulmonary ROS, shortness of breath and with exertion, asthma , former smoker,    Pulmonary exam normal        Cardiovascular Exercise Tolerance: Good hypertension, On Medications negative cardio ROS Normal cardiovascular exam+ dysrhythmias  Rate:Normal     Neuro/Psych  Headaches, TIACVA negative neurological ROS  negative psych ROS   GI/Hepatic negative GI ROS, Neg liver ROS, hiatal hernia, GERD  Medicated,  Endo/Other  negative endocrine ROS  Renal/GU negative Renal ROS  negative genitourinary   Musculoskeletal   Abdominal   Peds  Hematology negative hematology ROS (+) anemia ,   Anesthesia Other Findings   Reproductive/Obstetrics negative OB ROS                             Anesthesia Physical Anesthesia Plan  ASA: III  Anesthesia Plan: General LMA   Post-op Pain Management:    Induction:   PONV Risk Score and Plan: 4 or greater and Ondansetron, Dexamethasone, Midazolam, Scopolamine patch - Pre-op and Propofol infusion  Airway Management Planned:   Additional Equipment:   Intra-op Plan:   Post-operative Plan:   Informed Consent: I have reviewed the patients History and Physical, chart, labs and discussed the procedure including the risks, benefits and alternatives for the proposed anesthesia with the patient or authorized representative who has indicated his/her understanding and acceptance.     Plan Discussed with: CRNA  Anesthesia Plan Comments:        Anesthesia Quick Evaluation

## 2017-01-01 NOTE — Discharge Instructions (Signed)
°  Diet: Resume home heart healthy regular diet.   Activity: No heavy lifting > 20 pounds (children, pets, laundry, garbage) or strenuous activity until follow-up, but light activity and walking are encouraged. Do not drive or drink alcohol if taking narcotic pain medications.  Wound care: May shower with soapy water and pat dry (do not rub incisions) starting tomorrow, but no baths or submerging incision underwater until follow-up. (no swimming)   Medications: Resume all home medications. For mild to moderate pain: acetaminophen (Tylenol) or ibuprofen (if no kidney disease). Combining Tylenol with alcohol can substantially increase your risk of causing liver disease. Narcotic pain medications, if prescribed, can be used for severe pain, though may cause nausea, constipation, and drowsiness. Do not combine Tylenol and Percocet within a 6 hour period as Percocet contains Tylenol. If you do not need the narcotic pain medication, you do not need to fill the prescription.  Call office [phone]) at any time if any questions, worsening pain, fevers/chills, bleeding, drainage from incision site, or other concerns.     AMBULATORY SURGERY  DISCHARGE INSTRUCTIONS   1) The drugs that you were given will stay in your system until tomorrow so for the next 24 hours you should not:  A) Drive an automobile B) Make any legal decisions C) Drink any alcoholic beverage   2) You may resume regular meals tomorrow.  Today it is better to start with liquids and gradually work up to solid foods.  You may eat anything you prefer, but it is better to start with liquids, then soup and crackers, and gradually work up to solid foods.   3) Please notify your doctor immediately if you have any unusual bleeding, trouble breathing, redness and pain at the surgery site, drainage, fever, or pain not relieved by medication.    4) Additional Instructions:        Please contact your physician with any problems  or Same Day Surgery at [phone], Monday through Friday 6 am to 4 pm, or [place] at [facility] number at [phone].

## 2017-01-01 NOTE — Transfer of Care (Signed)
Immediate Anesthesia Transfer of Care Note  Patient: Doris Barnes  Procedure(s) Performed: PARTIAL MASTECTOMY WITH NEEDLE LOCALIZATION (Left ) SENTINEL NODE BIOPSY (Left ) INSERTION PORT-A-CATH (Right Chest)  Patient Location: PACU  Anesthesia Type:General  Level of Consciousness: sedated  Airway & Oxygen Therapy: Patient Spontanous Breathing and Patient connected to face mask oxygen  Post-op Assessment: Report given to RN and Post -op Vital signs reviewed and stable  Post vital signs: Reviewed and stable  Last Vitals:  Vitals:   01/01/17 0919  BP: (!) 158/65  Pulse: 67  Resp: 18  Temp: (!) 35.7 C  SpO2: 99%    Last Pain:  Vitals:   01/01/17 0919  TempSrc: Tympanic  PainSc: 0-No pain      Patients Stated Pain Goal: 0 (03/40/35 2481)  Complications: No apparent anesthesia complications

## 2017-01-01 NOTE — Anesthesia Procedure Notes (Signed)
Procedure Name: LMA Insertion Date/Time: 01/01/2017 11:14 AM Performed by: Clinton Sawyer Pre-anesthesia Checklist: Patient identified, Emergency Drugs available, Suction available, Patient being monitored and Timeout performed Patient Re-evaluated:Patient Re-evaluated prior to induction Oxygen Delivery Method: Circle system utilized Preoxygenation: Pre-oxygenation with 100% oxygen Induction Type: IV induction LMA: LMA inserted LMA Size: 4.0 Number of attempts: 1 Placement Confirmation: ETT inserted through vocal cords under direct vision Tube secured with: Tape Dental Injury: Teeth and Oropharynx as per pre-operative assessment

## 2017-01-02 ENCOUNTER — Other Ambulatory Visit: Payer: Self-pay | Admitting: *Deleted

## 2017-01-02 ENCOUNTER — Encounter: Payer: Self-pay | Admitting: General Surgery

## 2017-01-02 DIAGNOSIS — Z170 Estrogen receptor positive status [ER+]: Secondary | ICD-10-CM

## 2017-01-02 DIAGNOSIS — IMO0002 Reserved for concepts with insufficient information to code with codable children: Secondary | ICD-10-CM

## 2017-01-02 DIAGNOSIS — C50312 Malignant neoplasm of lower-inner quadrant of left female breast: Secondary | ICD-10-CM

## 2017-01-02 LAB — SURGICAL PATHOLOGY

## 2017-01-03 NOTE — Anesthesia Postprocedure Evaluation (Signed)
Anesthesia Post Note  Patient: Doris Barnes  Procedure(s) Performed: PARTIAL MASTECTOMY WITH NEEDLE LOCALIZATION (Left ) SENTINEL NODE BIOPSY (Left ) INSERTION PORT-A-CATH (Right Chest)  Patient location during evaluation: PACU Anesthesia Type: General Level of consciousness: awake and alert Pain management: pain level controlled Vital Signs Assessment: post-procedure vital signs reviewed and stable Respiratory status: spontaneous breathing, nonlabored ventilation, respiratory function stable and patient connected to nasal cannula oxygen Cardiovascular status: blood pressure returned to baseline and stable Postop Assessment: no apparent nausea or vomiting Anesthetic complications: no     Last Vitals:  Vitals:   01/01/17 1457 01/01/17 1511  BP: (!) 159/61 140/63  Pulse: 74   Resp: 16   Temp: 36.7 C   SpO2: 94% 94%    Last Pain:  Vitals:   01/02/17 0853  TempSrc:   PainSc: 7                  Molli Barrows

## 2017-01-04 ENCOUNTER — Inpatient Hospital Stay: Payer: Medicare HMO | Attending: Oncology | Admitting: Oncology

## 2017-01-04 ENCOUNTER — Inpatient Hospital Stay: Payer: Medicare HMO

## 2017-01-04 ENCOUNTER — Telehealth: Payer: Self-pay | Admitting: Oncology

## 2017-01-04 ENCOUNTER — Encounter: Payer: Self-pay | Admitting: Oncology

## 2017-01-04 VITALS — BP 160/79 | HR 69 | Temp 96.1°F | Resp 14 | Wt 126.4 lb

## 2017-01-04 DIAGNOSIS — M545 Low back pain: Secondary | ICD-10-CM | POA: Diagnosis not present

## 2017-01-04 DIAGNOSIS — K219 Gastro-esophageal reflux disease without esophagitis: Secondary | ICD-10-CM | POA: Insufficient documentation

## 2017-01-04 DIAGNOSIS — C50312 Malignant neoplasm of lower-inner quadrant of left female breast: Secondary | ICD-10-CM | POA: Diagnosis not present

## 2017-01-04 DIAGNOSIS — Z7189 Other specified counseling: Secondary | ICD-10-CM

## 2017-01-04 DIAGNOSIS — Z170 Estrogen receptor positive status [ER+]: Principal | ICD-10-CM

## 2017-01-04 DIAGNOSIS — F419 Anxiety disorder, unspecified: Secondary | ICD-10-CM | POA: Diagnosis not present

## 2017-01-04 DIAGNOSIS — R51 Headache: Secondary | ICD-10-CM | POA: Diagnosis not present

## 2017-01-04 DIAGNOSIS — K6289 Other specified diseases of anus and rectum: Secondary | ICD-10-CM | POA: Insufficient documentation

## 2017-01-04 DIAGNOSIS — Z9012 Acquired absence of left breast and nipple: Secondary | ICD-10-CM | POA: Insufficient documentation

## 2017-01-04 DIAGNOSIS — Z5112 Encounter for antineoplastic immunotherapy: Secondary | ICD-10-CM | POA: Insufficient documentation

## 2017-01-04 DIAGNOSIS — K589 Irritable bowel syndrome without diarrhea: Secondary | ICD-10-CM | POA: Insufficient documentation

## 2017-01-04 DIAGNOSIS — M810 Age-related osteoporosis without current pathological fracture: Secondary | ICD-10-CM | POA: Insufficient documentation

## 2017-01-04 DIAGNOSIS — Z7982 Long term (current) use of aspirin: Secondary | ICD-10-CM | POA: Insufficient documentation

## 2017-01-04 DIAGNOSIS — Z79899 Other long term (current) drug therapy: Secondary | ICD-10-CM | POA: Diagnosis not present

## 2017-01-04 DIAGNOSIS — K449 Diaphragmatic hernia without obstruction or gangrene: Secondary | ICD-10-CM | POA: Diagnosis not present

## 2017-01-04 DIAGNOSIS — I341 Nonrheumatic mitral (valve) prolapse: Secondary | ICD-10-CM | POA: Diagnosis not present

## 2017-01-04 DIAGNOSIS — Z87891 Personal history of nicotine dependence: Secondary | ICD-10-CM | POA: Diagnosis not present

## 2017-01-04 DIAGNOSIS — Z5111 Encounter for antineoplastic chemotherapy: Secondary | ICD-10-CM | POA: Diagnosis not present

## 2017-01-04 DIAGNOSIS — J45909 Unspecified asthma, uncomplicated: Secondary | ICD-10-CM | POA: Insufficient documentation

## 2017-01-04 DIAGNOSIS — D649 Anemia, unspecified: Secondary | ICD-10-CM | POA: Insufficient documentation

## 2017-01-04 DIAGNOSIS — Z800 Family history of malignant neoplasm of digestive organs: Secondary | ICD-10-CM | POA: Insufficient documentation

## 2017-01-04 DIAGNOSIS — I10 Essential (primary) hypertension: Secondary | ICD-10-CM | POA: Insufficient documentation

## 2017-01-04 DIAGNOSIS — Z171 Estrogen receptor negative status [ER-]: Secondary | ICD-10-CM | POA: Diagnosis not present

## 2017-01-04 DIAGNOSIS — Z8673 Personal history of transient ischemic attack (TIA), and cerebral infarction without residual deficits: Secondary | ICD-10-CM | POA: Diagnosis not present

## 2017-01-04 DIAGNOSIS — N3010 Interstitial cystitis (chronic) without hematuria: Secondary | ICD-10-CM

## 2017-01-04 DIAGNOSIS — Z8719 Personal history of other diseases of the digestive system: Secondary | ICD-10-CM | POA: Insufficient documentation

## 2017-01-04 DIAGNOSIS — E7800 Pure hypercholesterolemia, unspecified: Secondary | ICD-10-CM | POA: Insufficient documentation

## 2017-01-04 DIAGNOSIS — N941 Unspecified dyspareunia: Secondary | ICD-10-CM | POA: Diagnosis not present

## 2017-01-04 LAB — COMPREHENSIVE METABOLIC PANEL
ALT: 20 U/L (ref 14–54)
AST: 27 U/L (ref 15–41)
Albumin: 3.8 g/dL (ref 3.5–5.0)
Alkaline Phosphatase: 63 U/L (ref 38–126)
Anion gap: 11 (ref 5–15)
BUN: 10 mg/dL (ref 6–20)
CO2: 28 mmol/L (ref 22–32)
Calcium: 9.4 mg/dL (ref 8.9–10.3)
Chloride: 94 mmol/L — ABNORMAL LOW (ref 101–111)
Creatinine, Ser: 0.67 mg/dL (ref 0.44–1.00)
GFR calc Af Amer: >60 mL/min (ref >60)
GFR calc non Af Amer: >60 mL/min (ref >60)
Glucose, Bld: 120 mg/dL — ABNORMAL HIGH (ref 65–99)
Potassium: 4.4 mmol/L (ref 3.5–5.1)
Sodium: 133 mmol/L — ABNORMAL LOW (ref 135–145)
Total Bilirubin: 0.5 mg/dL (ref 0.3–1.2)
Total Protein: 7 g/dL (ref 6.5–8.1)

## 2017-01-04 LAB — CBC WITH DIFFERENTIAL/PLATELET
Basophils Absolute: 0 10*3/uL (ref 0–0.1)
Basophils Relative: 1 %
Eosinophils Absolute: 0.3 10*3/uL (ref 0–0.7)
Eosinophils Relative: 6 %
HCT: 37.8 % (ref 35.0–47.0)
Hemoglobin: 12.6 g/dL (ref 12.0–16.0)
Lymphocytes Relative: 21 %
Lymphs Abs: 1.2 10*3/uL (ref 1.0–3.6)
MCH: 30.2 pg (ref 26.0–34.0)
MCHC: 33.5 g/dL (ref 32.0–36.0)
MCV: 90.1 fL (ref 80.0–100.0)
Monocytes Absolute: 0.5 10*3/uL (ref 0.2–0.9)
Monocytes Relative: 9 %
Neutro Abs: 3.8 10*3/uL (ref 1.4–6.5)
Neutrophils Relative %: 65 %
Platelets: 222 10*3/uL (ref 150–440)
RBC: 4.19 MIL/uL (ref 3.80–5.20)
RDW: 13.2 % (ref 11.5–14.5)
WBC: 5.9 10*3/uL (ref 3.6–11.0)

## 2017-01-04 MED ORDER — LIDOCAINE-PRILOCAINE 2.5-2.5 % EX CREA: TOPICAL_CREAM | CUTANEOUS | 3 refills | Status: DC

## 2017-01-04 MED ORDER — PROCHLORPERAZINE MALEATE 10 MG PO TABS: 10.0000 mg | ORAL_TABLET | Freq: Four times a day (QID) | ORAL | 1 refills | Status: DC | PRN

## 2017-01-04 MED ORDER — LORAZEPAM 0.5 MG PO TABS: 0.5000 mg | ORAL_TABLET | Freq: Four times a day (QID) | ORAL | 0 refills | Status: DC | PRN

## 2017-01-04 MED ORDER — ONDANSETRON HCL 8 MG PO TABS: 8.0000 mg | ORAL_TABLET | Freq: Two times a day (BID) | ORAL | 1 refills | Status: DC | PRN

## 2017-01-04 NOTE — Patient Instructions (Signed)
Trastuzumab injection for infusion What is this medicine? TRASTUZUMAB (tras TOO zoo mab) is a monoclonal antibody. It is used to treat breast cancer and stomach cancer. This medicine may be used for other purposes; ask your health care provider or pharmacist if you have questions. COMMON BRAND NAME(S): Herceptin What should I tell my health care provider before I take this medicine? They need to know if you have any of these conditions: -heart disease -heart failure -lung or breathing disease, like asthma -an unusual or allergic reaction to trastuzumab, benzyl alcohol, or other medications, foods, dyes, or preservatives -pregnant or trying to get pregnant -breast-feeding How should I use this medicine? This drug is given as an infusion into a vein. It is administered in a hospital or clinic by a specially trained health care professional. Talk to your pediatrician regarding the use of this medicine in children. This medicine is not approved for use in children. Overdosage: If you think you have taken too much of this medicine contact a poison control center or emergency room at once. NOTE: This medicine is only for you. Do not share this medicine with others. What if I miss a dose? It is important not to miss a dose. Call your doctor or health care professional if you are unable to keep an appointment. What may interact with this medicine? This medicine may interact with the following medications: -certain types of chemotherapy, such as daunorubicin, doxorubicin, epirubicin, and idarubicin This list may not describe all possible interactions. Give your health care provider a list of all the medicines, herbs, non-prescription drugs, or dietary supplements you use. Also tell them if you smoke, drink alcohol, or use illegal drugs. Some items may interact with your medicine. What should I watch for while using this medicine? Visit your doctor for checks on your progress. Report any side effects.  Continue your course of treatment even though you feel ill unless your doctor tells you to stop. Call your doctor or health care professional for advice if you get a fever, chills or sore throat, or other symptoms of a cold or flu. Do not treat yourself. Try to avoid being around people who are sick. You may experience fever, chills and shaking during your first infusion. These effects are usually mild and can be treated with other medicines. Report any side effects during the infusion to your health care professional. Fever and chills usually do not happen with later infusions. Do not become pregnant while taking this medicine or for 7 months after stopping it. Women should inform their doctor if they wish to become pregnant or think they might be pregnant. Women of child-bearing potential will need to have a negative pregnancy test before starting this medicine. There is a potential for serious side effects to an unborn child. Talk to your health care professional or pharmacist for more information. Do not breast-feed an infant while taking this medicine or for 7 months after stopping it. Women must use effective birth control with this medicine. What side effects may I notice from receiving this medicine? Side effects that you should report to your doctor or health care professional as soon as possible: -allergic reactions like skin rash, itching or hives, swelling of the face, lips, or tongue -chest pain or palpitations -cough -dizziness -feeling faint or lightheaded, falls -fever -general ill feeling or flu-like symptoms -signs of worsening heart failure like breathing problems; swelling in your legs and feet -unusually weak or tired Side effects that usually do not require medical   attention (report to your doctor or health care professional if they continue or are bothersome): -bone pain -changes in taste -diarrhea -joint pain -nausea/vomiting -weight loss This list may not describe all  possible side effects. Call your doctor for medical advice about side effects. You may report side effects to FDA at 1-800-FDA-1088. Where should I keep my medicine? This drug is given in a hospital or clinic and will not be stored at home. NOTE: This sheet is a summary. It may not cover all possible information. If you have questions about this medicine, talk to your doctor, pharmacist, or health care provider.  2018 [employer] Standard (2016-03-07 14:37:52) Paclitaxel injection What is this medicine? PACLITAXEL (PAK li TAX el) is a chemotherapy drug. It targets fast dividing cells, like cancer cells, and causes these cells to die. This medicine is used to treat ovarian cancer, breast cancer, and other cancers. This medicine may be used for other purposes; ask your health care provider or pharmacist if you have questions. COMMON BRAND NAME(S): Onxol, Taxol What should I tell my health care provider before I take this medicine? They need to know if you have any of these conditions: -blood disorders -irregular heartbeat -infection (especially a virus infection such as chickenpox, cold sores, or herpes) -liver disease -previous or ongoing radiation therapy -an unusual or allergic reaction to paclitaxel, alcohol, polyoxyethylated castor oil, other chemotherapy agents, other medicines, foods, dyes, or preservatives -pregnant or trying to get pregnant -breast-feeding How should I use this medicine? This drug is given as an infusion into a vein. It is administered in a hospital or clinic by a specially trained health care professional. Talk to your pediatrician regarding the use of this medicine in children. Special care may be needed. Overdosage: If you think you have taken too much of this medicine contact a poison control center or emergency room at once. NOTE: This medicine is only for you. Do not share this medicine with others. What if I miss a dose? It is important not to miss your  dose. Call your doctor or health care professional if you are unable to keep an appointment. What may interact with this medicine? Do not take this medicine with any of the following medications: -disulfiram -metronidazole This medicine may also interact with the following medications: -cyclosporine -diazepam -ketoconazole -medicines to increase blood counts like filgrastim, pegfilgrastim, sargramostim -other chemotherapy drugs like cisplatin, doxorubicin, epirubicin, etoposide, teniposide, vincristine -quinidine -testosterone -vaccines -verapamil Talk to your doctor or health care professional before taking any of these medicines: -acetaminophen -aspirin -ibuprofen -ketoprofen -naproxen This list may not describe all possible interactions. Give your health care provider a list of all the medicines, herbs, non-prescription drugs, or dietary supplements you use. Also tell them if you smoke, drink alcohol, or use illegal drugs. Some items may interact with your medicine. What should I watch for while using this medicine? Your condition will be monitored carefully while you are receiving this medicine. You will need important blood work done while you are taking this medicine. This medicine can cause serious allergic reactions. To reduce your risk you will need to take other medicine(s) before treatment with this medicine. If you experience allergic reactions like skin rash, itching or hives, swelling of the face, lips, or tongue, tell your doctor or health care professional right away. In some cases, you may be given additional medicines to help with side effects. Follow all directions for their use. This drug may make you feel generally unwell. This is not uncommon,   as chemotherapy can affect healthy cells as well as cancer cells. Report any side effects. Continue your course of treatment even though you feel ill unless your doctor tells you to stop. Call your doctor or health care  professional for advice if you get a fever, chills or sore throat, or other symptoms of a cold or flu. Do not treat yourself. This drug decreases your body's ability to fight infections. Try to avoid being around people who are sick. This medicine may increase your risk to bruise or bleed. Call your doctor or health care professional if you notice any unusual bleeding. Be careful brushing and flossing your teeth or using a toothpick because you may get an infection or bleed more easily. If you have any dental work done, tell your dentist you are receiving this medicine. Avoid taking products that contain aspirin, acetaminophen, ibuprofen, naproxen, or ketoprofen unless instructed by your doctor. These medicines may hide a fever. Do not become pregnant while taking this medicine. Women should inform their doctor if they wish to become pregnant or think they might be pregnant. There is a potential for serious side effects to an unborn child. Talk to your health care professional or pharmacist for more information. Do not breast-feed an infant while taking this medicine. Men are advised not to father a child while receiving this medicine. This product may contain alcohol. Ask your pharmacist or healthcare provider if this medicine contains alcohol. Be sure to tell all healthcare providers you are taking this medicine. Certain medicines, like metronidazole and disulfiram, can cause an unpleasant reaction when taken with alcohol. The reaction includes flushing, headache, nausea, vomiting, sweating, and increased thirst. The reaction can last from 30 minutes to several hours. What side effects may I notice from receiving this medicine? Side effects that you should report to your doctor or health care professional as soon as possible: -allergic reactions like skin rash, itching or hives, swelling of the face, lips, or tongue -low blood counts - This drug may decrease the number of white blood cells, red blood  cells and platelets. You may be at increased risk for infections and bleeding. -signs of infection - fever or chills, cough, sore throat, pain or difficulty passing urine -signs of decreased platelets or bleeding - bruising, pinpoint red spots on the skin, black, tarry stools, nosebleeds -signs of decreased red blood cells - unusually weak or tired, fainting spells, lightheadedness -breathing problems -chest pain -high or low blood pressure -mouth sores -nausea and vomiting -pain, swelling, redness or irritation at the injection site -pain, tingling, numbness in the hands or feet -slow or irregular heartbeat -swelling of the ankle, feet, hands Side effects that usually do not require medical attention (report to your doctor or health care professional if they continue or are bothersome): -bone pain -complete hair loss including hair on your head, underarms, pubic hair, eyebrows, and eyelashes -changes in the color of fingernails -diarrhea -loosening of the fingernails -loss of appetite -muscle or joint pain -red flush to skin -sweating This list may not describe all possible side effects. Call your doctor for medical advice about side effects. You may report side effects to FDA at 1-800-FDA-1088. Where should I keep my medicine? This drug is given in a hospital or clinic and will not be stored at home. NOTE: This sheet is a summary. It may not cover all possible information. If you have questions about this medicine, talk to your doctor, pharmacist, or health care provider.  2018 [employer] Standard (2015-01-12   19:58:00)  

## 2017-01-04 NOTE — Progress Notes (Signed)
Hematology/Oncology Consult note [facility]  Telephone:([phone] [phone]  Patient Care Team: Rusty Aus, MD as PCP - General (Internal Medicine)   Name of the patient: Doris Barnes  [id number]  [birth year]   Date of visit: 01/04/17  Diagnosis- Stage IA invasive mammary carcinoma of the left breast ER negative, PR weakly positive and HER-2 positive  Chief complaint/ Reason for visit- discuss pathology results and further management  Heme/Onc history: 1. Patient is a 76 year old female who underwent bilateral screening mammogram on 11/28/2016 which showed a possible mass and calcifications in her left breast. This was followed by diagnostic mammogram and an ultrasound of the left breast which showed: 1.4 x 1.2 x 1.3 cm irregular mass in the left breast at the 6:30 position 3 cm from the nipple. On magnification views the overall extent of the mass and calcifications measures 1.9 cm. Left axilla was evaluated with ultrasound and showed no enlarged and morphologically abnormal lymph nodes.   2. She underwent core biopsy of the left breast mass which showed: Invasive mammary carcinoma, grade 3. No DCIS or lymphovascular invasion. ER was negative and PR was 11-50% positive and HER-2 was +3 on IHC  3 [name] who plans for elective lumpectomy with sentinel lymph node biopsy pending oncology opinion.  4. Menarche at the age of 30 years. G5 P5 L5. She did have two prior biopsies of her left breast which did not reveal any malignancy. No family history of breast or ovarian cancer. Her sister and maternal grandfather had colon cancer. She is considerably anxious today at the thought of getting chemotherapy. She does have a history of mitral valve prolapse and states that she has "irregular heartbeat"for which she is on atenolol. She had a Holter monitor in the past but was not found to have any arrhythmia. Baseline MUGA  scan showed a normal EF of 64.5%  5. Patient underwent lumpectomy on 01/01/2017 which showed invasive mammary carcinoma, size of tumor was 14 mm, grade 3 with negative margins. 0 out of one lymph nodes was positive for malignancy. There was associated DCIS. Lymphovascular invasion was negative. ER negative, PR 11-50% positive and HER-2/neu +3   Interval history- Patiwent is doing well post surgery. She denies any complaints today  ECOG PS- 0 Pain scale- 0   Review of systems- Review of Systems  Constitutional: Negative for chills, fever, malaise/fatigue and weight loss.  HENT: Negative for congestion, ear discharge and nosebleeds.   Eyes: Negative for blurred vision.  Respiratory: Negative for cough, hemoptysis, sputum production, shortness of breath and wheezing.   Cardiovascular: Negative for chest pain, palpitations, orthopnea and claudication.  Gastrointestinal: Negative for abdominal pain, blood in stool, constipation, diarrhea, heartburn, melena, nausea and vomiting.  Genitourinary: Negative for dysuria, flank pain, frequency, hematuria and urgency.  Musculoskeletal: Negative for back pain, joint pain and myalgias.  Skin: Negative for rash.  Neurological: Negative for dizziness, tingling, focal weakness, seizures, weakness and headaches.  Endo/Heme/Allergies: Does not bruise/bleed easily.  Psychiatric/Behavioral: Negative for depression and suicidal ideas. The patient does not have insomnia.       Allergies  Allergen Reactions  . Imipramine Pamoate Shortness Of Breath  . Bisphosphonates Nausea Only  . Epinephrine Other (See Comments)    Difficulty breathing  . Prednisone Palpitations  . Sulfa Antibiotics Rash    Face turns red and stings     Past Medical History:  Diagnosis Date  . Anemia   .  Anxiety   . Asthma   . Cancer ([place]) skin  . Chronic vulvitis   . Colon polyp   . Cystocele   . Diverticulitis   . Diverticulitis   . Diverticulosis   . Diverticulosis     . Dyspareunia, female   . Dyspnea   . Dysrhythmia   . Fibrocystic disease of both breasts   . Gastritis   . GERD (gastroesophageal reflux disease)    gastritis also  . Hemorrhoids   . History of hiatal hernia   . Hypercholesteremia   . Hypertension   . IBS (irritable bowel syndrome)   . IC (interstitial cystitis)   . Migraine   . Mitral valve prolapse   . OP (osteoporosis)   . Positional vertigo   . Rectocele   . Squamous cell carcinoma   . Stroke [facility])    TIA  . Vaginal atrophy      Past Surgical History:  Procedure Laterality Date  . APPENDECTOMY    . BREAST BIOPSY Left 1998   core bx- neg  . BREAST BIOPSY Left 2007   neg  . BREAST BIOPSY Left 12/14/2016   left breast US core path pending  . BREAST EXCISIONAL BIOPSY Left   . BREAST LUMPECTOMY Left 01/01/2017    INVASIVE MAMMARY CARCINOMA/Grade 3  . CARDIAC CATHETERIZATION N/A 01/18/2015   Procedure: Left Heart Cath and Coronary Angiography;  Surgeon: Teodoro Spray, MD;  Location: [place] [facility];  Service: Cardiovascular;  Laterality: N/A;  . CATARACT EXTRACTION W/PHACO Right 12/15/2015   Procedure: CATARACT EXTRACTION PHACO AND INTRAOCULAR LENS PLACEMENT (IOC);  Surgeon: Estill Cotta, MD;  Location: [facility] ORS;  Service: Ophthalmology;  Laterality: Right;  [place] 01:20 AP% 23.9 CDE 35.49 Fluid pack lot # [id number]  . CATARACT EXTRACTION W/PHACO Left 12/29/2015   Procedure: CATARACT EXTRACTION PHACO AND INTRAOCULAR LENS PLACEMENT ([place]);  Surgeon: Estill Cotta, MD;  Location: [facility] ORS;  Service: Ophthalmology;  Laterality: Left;  [place]  01:20 AP% 25.1 CDE 32.86 Fluid pack lot # [id number]  . COLON SURGERY    . COLONOSCOPY WITH PROPOFOL N/A 02/21/2016   Procedure: COLONOSCOPY WITH PROPOFOL;  Surgeon: Manya Silvas, MD;  Location: [facility];  Service: Endoscopy;  Laterality: N/A;  . CORONARY ANGIOPLASTY    . DILATION AND CURETTAGE OF UTERUS    . OOPHORECTOMY Bilateral   . PARTIAL MASTECTOMY WITH  NEEDLE LOCALIZATION Left 01/01/2017   Procedure: PARTIAL MASTECTOMY WITH NEEDLE LOCALIZATION;  Surgeon: Herbert Pun, MD;  Location: [facility] ORS;  Service: General;  Laterality: Left;  . PORTACATH PLACEMENT Right 01/01/2017   Procedure: INSERTION PORT-A-CATH;  Surgeon: Herbert Pun, MD;  Location: [facility] ORS;  Service: General;  Laterality: Right;  . SENTINEL NODE BIOPSY Left 01/01/2017   Procedure: SENTINEL NODE BIOPSY;  Surgeon: Herbert Pun, MD;  Location: [facility] OR[facility];  Service: General;  Laterality: Left;  . TONSILLECTOMY    . VAGINAL HYSTERECTOMY      Social History   Social History  . Marital status: Married    Spouse name: N/A  . Number of children: N/A  . Years of education: N/A   Occupational History  . Not on file.   Social History Main Topics  . Smoking status: Former Smoker    Packs/day: 1.00    Years: 33.00    Quit date: 10/17/1987  . Smokeless tobacco: Never Used  . Alcohol use No  . Drug use: No  . Sexual activity: Yes   Other Topics Concern  . Not on  file   Social History Narrative  . No narrative on file    Family History  Problem Relation Age of Onset  . Diabetes Mother   . Diabetes Father   . Colon cancer Sister   . Diabetes Sister   . Diabetes Maternal Aunt   . Colon cancer Paternal Grandfather   . Lung cancer Brother   . Breast cancer Neg Hx   . Ovarian cancer Neg Hx      Current Outpatient Prescriptions:  .  acetaminophen (TYLENOL) 325 MG tablet, Take 650 mg by mouth every 6 (six) hours as needed (for headaches.). , Disp: , Rfl:  .  ALPRAZolam (XANAX) 0.25 MG tablet, Take 0.125-0.25 mg by mouth 2 (two) times daily as needed for anxiety or sleep. , Disp: , Rfl:  .  aspirin EC 325 MG tablet, Take 325 mg by mouth daily with breakfast. , Disp: , Rfl:  .  atenolol (TENORMIN) 50 MG tablet, Take 50 mg by mouth daily with breakfast. , Disp: , Rfl:  .  calcium citrate-vitamin D (CITRACAL+D) 315-200 MG-UNIT tablet, Take 1 tablet by  mouth 2 (two) times daily., Disp: , Rfl:  .  FLUoxetine (PROZAC) 40 MG capsule, Take 40 mg by mouth at bedtime., Disp: , Rfl:  .  fluticasone (FLONASE) 50 MCG/ACT nasal spray, Place 2 sprays into both nostrils daily as needed for allergies. , Disp: , Rfl:  .  hydrochlorothiazide (HYDRODIURIL) 25 MG tablet, Take 25 mg by mouth daily. , Disp: , Rfl:  .  lansoprazole (PREVACID) 30 MG capsule, Take 30 mg by mouth daily before breakfast. , Disp: , Rfl:  .  loratadine (CLARITIN) 10 MG tablet, Take 10 mg by mouth daily as needed for allergies. , Disp: , Rfl:  .  Magnesium 250 MG TABS, Take 250 mg by mouth daily., Disp: , Rfl:  .  Multiple Vitamins-Minerals (PRESERVISION AREDS 2) CAPS, Take 1 capsule by mouth 2 (two) times daily. , Disp: , Rfl:  .  simvastatin (ZOCOR) 20 MG tablet, Take 20 mg by mouth daily at 8 pm., Disp: , Rfl:  .  telmisartan (MICARDIS) 80 MG tablet, Take 40 mg by mouth daily with breakfast., Disp: , Rfl:  .  traMADol (ULTRAM) 50 MG tablet, Take 1 tablet (50 mg total) by mouth every 6 (six) hours as needed for moderate pain., Disp: 12 tablet, Rfl: 0  Physical exam:  Vitals:   01/04/17 1034  BP: (!) 160/79  Pulse: 69  Resp: 14  Temp: (!) 96.1 F (35.6 C)  TempSrc: Tympanic  Weight: 126 lb 6.4 oz (57.3 kg)   Physical Exam  Constitutional: She is oriented to person, place, and time and well-developed, well-nourished, and in no distress.  HENT:  Head: Normocephalic and atraumatic.  Eyes: Pupils are equal, round, and reactive to light. EOM are normal.  Neck: Normal range of motion.  Cardiovascular: Normal rate, regular rhythm and normal heart sounds.   Pulmonary/Chest: Effort normal and breath sounds normal.  Chest wall port in place  Abdominal: Soft. Bowel sounds are normal.  Neurological: She is alert and oriented to person, place, and time.  Skin: Skin is warm and dry.   Breast exam was performed in seated and lying down position. Patient is status post left lumpectomy  with a well-healed surgical scar. No evidence of any palpable masses. No evidence of axillary adenopathy. No evidence of any palpable masses or lumps in the right breast. No evidence of right axillary adenopathy   CMP Latest  Ref Rng & Units 05/08/2016  Glucose 65 - 99 mg/dL 180(H)  BUN 6 - 20 mg/dL 12  Creatinine 0.44 - 1.00 mg/dL 0.66  Sodium 135 - 145 mmol/L 135  Potassium 3.5 - 5.1 mmol/L 4.0  Chloride 101 - 111 mmol/L 100(L)  CO2 22 - 32 mmol/L 29  Calcium 8.9 - 10.3 mg/dL 9.1  Total Protein 6.5 - 8.1 g/dL 7.2  Total Bilirubin 0.3 - 1.2 mg/dL 0.5  Alkaline Phos 38 - 126 U/L 76  AST 15 - 41 U/L 24  ALT 14 - 54 U/L 17   CBC Latest Ref Rng & Units 05/08/2016  WBC 3.6 - 11.0 K/uL 6.1  Hemoglobin 12.0 - 16.0 g/dL 13.2  Hematocrit 35.0 - 47.0 % 38.0  Platelets 150 - 440 K/uL 213    No images are attached to the encounter.  Nm Cardiac Muga Rest  Result Date: 12/27/2016 CLINICAL DATA:  Breast cancer.  Pre chemotherapy assessment EXAM: NUCLEAR MEDICINE CARDIAC BLOOD POOL IMAGING (MUGA) TECHNIQUE: Cardiac multi-gated acquisition was performed at rest following intravenous injection of Tc-31mlabeled red blood cells. RADIOPHARMACEUTICALS:  22.0 mCi Tc-915mDP in-vitro labeled red blood cells IV COMPARISON:  None. FINDINGS: The left ventricular ejection fraction was equal to 64.5% which was obtained with a heart rate of 70 beats per minute. No wall motion abnormality. IMPRESSION: 1. Left ventricular ejection fraction equals 64.5%. Electronically Signed   By: TaKerby Moors.D.   On: 12/27/2016 15:17   Nm Sentinel Node Injection  Result Date: 01/01/2017 CLINICAL DATA:  Left breast cancer. EXAM: NUCLEAR MEDICINE BREAST LYMPHOSCINTIGRAPHY left breast TECHNIQUE: Intradermal injection of radiopharmaceutical was performed at the 12 o'clock, 3 o'clock, 6 o'clock, and 9 o'clock positions around the left nipple. The patient was then sent to the operating room where the sentinel node(s) were identified  and removed by the surgeon. RADIOPHARMACEUTICALS:  Total of 1 mCi Millipore-filtered Technetium-992mlfur colloid, injected in four aliquots of 0.25 mCi each. IMPRESSION: Uncomplicated intradermal injection of a total of 1 mCi Technetium-[id number] colloid for purposes of sentinel node identification. Electronically Signed   By: David  JordMartinique.   On: 01/01/2017 09:49   Mm Breast Surgical Specimen  Result Date: 01/01/2017 CLINICAL DATA:  Status post left breast surgical excision today. EXAM: SPECIMEN RADIOGRAPH OF THE LEFT BREAST COMPARISON:  Previous exam(s). FINDINGS: Status post excision of the left breast. The wire tip and biopsy marker clip are present within the specimen. Findings discussed with the OR staff during the procedure. IMPRESSION: Specimen radiograph of the left breast. Electronically Signed   By: StanFranki Cabot.   On: 01/01/2017 12:55   Dg Chest Port 1 View  Result Date: 01/01/2017 CLINICAL DATA:  Status post port placement. EXAM: PORTABLE CHEST 1 VIEW COMPARISON:  12/18/2015 FINDINGS: Right jugular Port-A-Cath has been placed. This is a CT injectable port. Catheter tip in the lower SVC region. Coarse lung markings are chronic. Negative for a pneumothorax. Heart size is normal. The trachea is midline. IMPRESSION: Placement of right jugular Port-A-Cath. Catheter tip in the lower SVC. Negative for a pneumothorax. Electronically Signed   By: AdamMarkus Daft.   On: 01/01/2017 14:22   Dg C-arm 1-60 Min-no Report  Result Date: 01/01/2017 Fluoroscopy was utilized by the requesting physician.  No radiographic interpretation.   Us B[place]ast Ltd Uni Left Inc Axilla  Result Date: 12/07/2016 CLINICAL DATA:  Patient returns today to evaluate a possible left breast mass and calcifications identified on recent screening mammogram. EXAM:  2D DIGITAL DIAGNOSTIC LEFT MAMMOGRAM WITH CAD AND ADJUNCT TOMO ULTRASOUND LEFT BREAST COMPARISON:  Previous exams including recent screening mammogram dated  11/28/2016. ACR Breast Density Category b: There are scattered areas of fibroglandular density. FINDINGS: On today's additional views with spot compression and 3D tomosynthesis, an irregular mass is confirmed within the lower inner quadrant of the left breast, with associated architectural distortion, measuring approximately 1.3 cm greatest dimension, with associated microcalcifications. On magnification views, the overall extent of mass and calcifications measures 1.9 cm. Biopsy clip within the upper left breast is stable in position. Mammographic images were processed with CAD. Targeted ultrasound is performed, showing an irregular mass within the left breast at the 6:30 o'clock axis, 3 cm from the nipple, measuring 1.4 x 1.2 x 1.3 cm, with internal vascularity, corresponding to the mammographic finding. Left axilla was evaluated with ultrasound showing no enlarged or morphologically abnormal lymph nodes. IMPRESSION: Irregular mass within the left breast at the 6:30 o'clock axis, 3 cm from the nipple, measuring 1.4 cm, corresponding to the mammographic finding. This is a highly suspicious finding for which ultrasound-guided biopsy is recommended. Of note, there are associated suspicious microcalcifications within and immediately adjacent to the mass. The combination of mass and surrounding calcifications spans 1.9 cm. RECOMMENDATION: Ultrasound-guided biopsy of the left breast mass at the 6:30 o'clock axis, 3 cm from the nipple, measuring 1.4 cm. Ordering physician will be contacted with today's results and patient will then be scheduled for ultrasound-guided biopsy at her earliest convenience. I have discussed the findings and recommendations with the patient. Results were also provided in writing at the conclusion of the visit. If applicable, a reminder letter will be sent to the patient regarding the next appointment. BI-RADS CATEGORY  5: Highly suggestive of malignancy. Electronically Signed   By: Franki Cabot M.D.   On: 12/07/2016 09:56   Mm Diag Breast Tomo Uni Left  Result Date: 12/07/2016 CLINICAL DATA:  Patient returns today to evaluate a possible left breast mass and calcifications identified on recent screening mammogram. EXAM: 2D DIGITAL DIAGNOSTIC LEFT MAMMOGRAM WITH CAD AND ADJUNCT TOMO ULTRASOUND LEFT BREAST COMPARISON:  Previous exams including recent screening mammogram dated 11/28/2016. ACR Breast Density Category b: There are scattered areas of fibroglandular density. FINDINGS: On today's additional views with spot compression and 3D tomosynthesis, an irregular mass is confirmed within the lower inner quadrant of the left breast, with associated architectural distortion, measuring approximately 1.3 cm greatest dimension, with associated microcalcifications. On magnification views, the overall extent of mass and calcifications measures 1.9 cm. Biopsy clip within the upper left breast is stable in position. Mammographic images were processed with CAD. Targeted ultrasound is performed, showing an irregular mass within the left breast at the 6:30 o'clock axis, 3 cm from the nipple, measuring 1.4 x 1.2 x 1.3 cm, with internal vascularity, corresponding to the mammographic finding. Left axilla was evaluated with ultrasound showing no enlarged or morphologically abnormal lymph nodes. IMPRESSION: Irregular mass within the left breast at the 6:30 o'clock axis, 3 cm from the nipple, measuring 1.4 cm, corresponding to the mammographic finding. This is a highly suspicious finding for which ultrasound-guided biopsy is recommended. Of note, there are associated suspicious microcalcifications within and immediately adjacent to the mass. The combination of mass and surrounding calcifications spans 1.9 cm. RECOMMENDATION: Ultrasound-guided biopsy of the left breast mass at the 6:30 o'clock axis, 3 cm from the nipple, measuring 1.4 cm. Ordering physician will be contacted with today's results and patient will  then  be scheduled for ultrasound-guided biopsy at her earliest convenience. I have discussed the findings and recommendations with the patient. Results were also provided in writing at the conclusion of the visit. If applicable, a reminder letter will be sent to the patient regarding the next appointment. BI-RADS CATEGORY  5: Highly suggestive of malignancy. Electronically Signed   By: Franki Cabot M.D.   On: 12/07/2016 09:56   Mm Clip Placement Left  Result Date: 12/14/2016 CLINICAL DATA:  Status post ultrasound-guided left breast biopsy EXAM: DIAGNOSTIC LEFT MAMMOGRAM POST ULTRASOUND BIOPSY COMPARISON:  Previous exam(s). FINDINGS: Mammographic images were obtained following ultrasound guided biopsy of a left breast mass at 6:30, 3 cm from the nipple. Post biopsy mammogram demonstrates the coil shaped biopsy marker in the expected location of the mass in the lower, inner left breast. IMPRESSION: Appropriate marker position as above. Final Assessment: Post Procedure Mammograms for Marker Placement Electronically Signed   By: Pamelia Hoit M.D.   On: 12/14/2016 09:13   [place] Lt Breast Bx W Loc Dev 1st Lesion Img Bx Spec US Guide  Addendum Date: 12/18/2016   ADDENDUM REPORT: 12/18/2016 09:13 ADDENDUM: Pathology of the left breast biopsy revealed A. BREAST, LEFT, 6:30, 3 CM FROM NIPPLE; ULTRASOUND-GUIDED CORE BIOPSY: INVASIVE MAMMARY CARCINOMA, NO SPECIAL TYPE. Size of invasive carcinoma: 10 mm in this sample. Histologic grade of invasive carcinoma: Grade 3. Ductal carcinoma in situ: Not identified. Lymphovascular invasion: Not identified. Calcifications: Not identified. ER/PR/HER2: Immunohistochemistry will be performed on block A1, with reflex to [place] for HER2 2+. The results will be reported in an addendum. Comment: The definitive grade will be assigned on the excisional specimen. These findings were communicated to Evette Georges in the [facility] on 12/15/2016 at 1:58 PM. Read-back procedure was  performed. This was found to be concordant by Dr. Brigitte Pulse. Recommendation: Surgical and oncology referrals. Also recommend a stereotactic guided biopsy of the anterior extent of calcifications seen mammographically for documentation of extent of disease. This was discussed with the patient at the time of biopsy by Dr. Brigitte Pulse. Results and recommendations were relayed to [facility], Dr. Ammie Ferrier nurse by Jetta Lout, [place]  on 12/18/16 at 9:00 AM by phone. She will relay the recommendation to Dr. Sabra Heck regarding the additional biopsy. They will contact the patient with results and make the referrals. Addendum by Jetta Lout, RRA on 12/18/16. Electronically Signed   By: Pamelia Hoit M.D.   On: 12/18/2016 09:13   Result Date: 12/18/2016 CLINICAL DATA:  76 year old female for ultrasound-guided biopsy of a suspicious left breast mass at 6:30, 3 cm from the nipple EXAM: ULTRASOUND GUIDED LEFT BREAST CORE NEEDLE BIOPSY COMPARISON:  Previous exam(s). FINDINGS: I met with the patient and we discussed the procedure of ultrasound-guided biopsy, including benefits and alternatives. We discussed the high likelihood of a successful procedure. We discussed the risks of the procedure, including infection, bleeding, tissue injury, clip migration, and inadequate sampling. Informed written consent was given. The usual time-out protocol was performed immediately prior to the procedure. Lesion quadrant: Lower inner quadrant Using sterile technique and 1% Lidocaine as local anesthetic, under direct ultrasound visualization, a 12 gauge spring-loaded device was used to perform biopsy of a suspicious left breast mass at 6:30, 3 cm from the nipple using a medial to lateral approach. At the conclusion of the procedure a coil shaped tissue marker clip was deployed into the biopsy cavity. Follow up 2 view mammogram was performed and dictated separately. IMPRESSION: Ultrasound guided biopsy of a suspicious left  breast mass at 6:30. No apparent  complications. Electronically Signed: By: Pamelia Hoit M.D. On: 12/14/2016 09:12   Mm Lt Plc Breast Loc Dev   1st Lesion  Inc Mammo Guide  Result Date: 01/01/2017 CLINICAL DATA:  Patient with left breast cancer scheduled for breast conservation surgery later today requiring preoperative needle localization. EXAM: NEEDLE LOCALIZATION OF THE LEFT BREAST WITH MAMMO GUIDANCE COMPARISON:  Previous exams. FINDINGS: Patient presents for needle localization prior to breast conservation surgery. I met with the patient and we discussed the procedure of needle localization including benefits and alternatives. We discussed the high likelihood of a successful procedure. We discussed the risks of the procedure, including infection, bleeding, tissue injury, and further surgery. Informed, written consent was given. The usual time-out protocol was performed immediately prior to the procedure. Using mammographic guidance, sterile technique, 1% lidocaine and a 9 cm modified Kopans needle, the coil shaped clip within the lower inner quadrant of the left breast was localized using medial approach. The images were marked for Dr. Windell Moment. IMPRESSION: Needle localization left breast. No apparent complications. Electronically Signed   By: Franki Cabot M.D.   On: 01/01/2017 08:31     Assessment and plan- Patient is a 76 y.o. female with newly diagnosed pathological prognostic stage IA pT1c1p N0 cM0 invasive mammary carcinoma of the left breast ER negative PR positive and HER-2 +3 on IHC s/p lumpectomy  Discussed the results of pathology with the patient in detail. She has Stage I A disease with 1.4 cm node negative grade 3, ER negative PR weakly positive and her 2 positive tumor with negative margins. Given that she has her 2 positive tumor, she will need adjuvant chemotherapy. Her tumor was small <3 cm in size. I would therefore favor adjuvant chemotherapy with taxol weekly along with herceptin weekly for 12 cycles. This will  be followed by herceptin Q3 weeks for 1 year. Discussed risks and benefits Including All but Not Limited to Nausea, Vomiting, Low Blood Counts, Risk of Infections, Hair Loss, Risk of Infusion Reactions As Well As Peripheral Neuropathy.  Discussed Risks and Benefits of Herceptin Including All but Not Limited to Fatigue, Diarrhea, Cardiotoxicity. Patient Understands and Agrees to Proceed As Planned. Baseline MUGA scan showed normal EF. She already has a port in place and we will tentatively plan to start chemotherapy in 1 week's time.chemotherapy will be given with curative intent  I will refer her to radiation oncology upon completion of chemotherapy for adjuvant radiation. She can continue to receive radiation along with Herceptin once Taxol is completed.  She does have a weakly PR positive for ER negative tumor and may not derive a lot of benefit from endocrine therapy but I will discuss this in greater detail upon completion of chemotherapy.   Total face to face encounter time for this patient visit was 30 min. >50% of the time was  spent in counseling and coordination of care.      Visit Diagnosis 1. Malignant neoplasm of lower-inner quadrant of left breast in female, estrogen receptor positive ([place])   2. Goals of care, counseling/discussion      Dr. Randa Evens, MD, MPH [facility] at [facility] Pager- [phone] 01/04/2017 8:57 AM

## 2017-01-04 NOTE — Progress Notes (Signed)
START ON PATHWAY REGIMEN - Breast   Paclitaxel Weekly + Trastuzumab Weekly:   Administer weekly:     Paclitaxel      Trastuzumab      Trastuzumab   **Always confirm dose/schedule in your pharmacy ordering system**    Trastuzumab (Maintenance - NO Loading Dose):   A cycle is every 21 days:     Trastuzumab   **Always confirm dose/schedule in your pharmacy ordering system**    Patient Characteristics: Postoperative without Neoadjuvant Therapy (Pathologic Staging), Invasive Disease, Adjuvant Therapy, HER2 Positive, ER Negative/Unknown, Node Negative, pT1c, , pN0/N61m Therapeutic Status: Postoperative without Neoadjuvant Therapy (Pathologic Staging) AJCC Grade: G3 AJCC N Category: pN0 AJCC M Category: cM0 ER Status: Negative (-) AJCC 8 Stage Grouping: IA HER2 Status: Positive (+) Oncotype Dx Recurrence Score: Not Appropriate AJCC T Category: pT1c PR Status: Positive (+) Intent of Therapy: Curative Intent, Discussed with Patient

## 2017-01-04 NOTE — Progress Notes (Signed)
Patient is here today for her MUGA scan results and to plan her chemotherapy treatment. She denies having any pain, but her new Port A Cath is still very sore. She is anxious about starting chemo.

## 2017-01-04 NOTE — Telephone Encounter (Signed)
Per Dr Janese Banks (verbal) Chemo Class scheduled for 01/09/17. Lab/MD/Herceptin/Taxol (chemo weekly) schd for 01/16/17

## 2017-01-09 ENCOUNTER — Inpatient Hospital Stay: Payer: Medicare HMO

## 2017-01-09 DIAGNOSIS — Z7189 Other specified counseling: Secondary | ICD-10-CM | POA: Insufficient documentation

## 2017-01-15 NOTE — Progress Notes (Signed)
Hematology/Oncology Consult note [facility]  [phone] [phone]  Patient Care Team: Rusty Aus, MD as PCP - General (Internal Medicine)   Name of the patient: Doris Barnes  [id number]  08/04/1940   Date of visit: 01/15/17  Diagnosis- Stage IA invasive mammary carcinoma of the left breast ER negative, PR weakly positive and HER-2 positive  Chief complaint/ Reason for visit- on treatment assessment prior to cycle 1 of taxol herceptin  Heme/Onc history: 1. Patient is a 76 year old female who underwent bilateral screening mammogram on 11/28/2016 which showed a possible mass and calcifications in her left breast. This was followed by diagnostic mammogram and an ultrasound of the left breast which showed: 1.4 x 1.2 x 1.3 cm irregular mass in the left breast at the 6:30 position 3 cm from the nipple. On magnification views the overall extent of the mass and calcifications measures 1.9 cm. Left axilla was evaluated with ultrasound and showed no enlarged and morphologically abnormal lymph nodes.   2. She underwent core biopsy of the left breast mass which showed: Invasive mammary carcinoma, grade 3. No DCIS or lymphovascular invasion. ER was negative and PR was 11-50% positive and HER-2 was +3 on IHC  3[name] who plans for elective lumpectomy with sentinel lymph node biopsy pending oncology opinion.  4. Menarche at the age of 66 years. G5 P5 L5. She did have twoprior biopsiesof her left breast which did not reveal any malignancy. No family history of breast or ovarian cancer. Her sister and maternal grandfather had colon cancer. She is considerably anxious today at the thought of getting chemotherapy. She does have a history of mitral valve prolapse and states that she has "irregular heartbeat"for which she is on atenolol. She had a Holter monitor in the past but was not found to have any arrhythmia.  Baseline MUGA scan showed a normal EF of 64.5%  5. Patient underwent lumpectomy on 01/01/2017 which showed invasive mammary carcinoma, size of tumor was 14 mm, grade 3 with negative margins. 0 out of one lymph nodes was positive for malignancy. There was associated DCIS. Lymphovascular invasion was negative. ER negative, PR 11-50% positive and HER-2/neu +3  Interval history- she is doing well. Denies any complaints. She tried taking ativan pre emptively for nausea but that made her more nervous  ECOG PS- 0 Pain scale- 0   Review of systems- Review of Systems  Constitutional: Negative for chills, fever, malaise/fatigue and weight loss.  HENT: Negative for congestion, ear discharge and nosebleeds.   Eyes: Negative for blurred vision.  Respiratory: Negative for cough, hemoptysis, sputum production, shortness of breath and wheezing.   Cardiovascular: Negative for chest pain, palpitations, orthopnea and claudication.  Gastrointestinal: Negative for abdominal pain, blood in stool, constipation, diarrhea, heartburn, melena, nausea and vomiting.  Genitourinary: Negative for dysuria, flank pain, frequency, hematuria and urgency.  Musculoskeletal: Negative for back pain, joint pain and myalgias.  Skin: Negative for rash.  Neurological: Negative for dizziness, tingling, focal weakness, seizures, weakness and headaches.  Endo/Heme/Allergies: Does not bruise/bleed easily.  Psychiatric/Behavioral: Negative for depression and suicidal ideas. The patient does not have insomnia.       Allergies  Allergen Reactions  . Imipramine Pamoate Shortness Of Breath  . Bisphosphonates Nausea Only  . Epinephrine Other (See Comments)    Difficulty breathing  . Prednisone Palpitations  . Sulfa Antibiotics Rash    Face turns red and stings     Past  Medical History:  Diagnosis Date  . Anemia   . Anxiety   . Asthma   . Cancer ([place]) skin  . Chronic vulvitis   . Colon polyp   . Cystocele   .  Diverticulitis   . Diverticulitis   . Diverticulosis   . Diverticulosis   . Dyspareunia, female   . Dyspnea   . Dysrhythmia   . Fibrocystic disease of both breasts   . Gastritis   . GERD (gastroesophageal reflux disease)    gastritis also  . Hemorrhoids   . History of hiatal hernia   . Hypercholesteremia   . Hypertension   . IBS (irritable bowel syndrome)   . IC (interstitial cystitis)   . Migraine   . Mitral valve prolapse   . OP (osteoporosis)   . Positional vertigo   . Rectocele   . Squamous cell carcinoma   . Stroke [facility])    TIA  . Vaginal atrophy      Past Surgical History:  Procedure Laterality Date  . APPENDECTOMY    . BREAST BIOPSY Left 1998   core bx- neg  . BREAST BIOPSY Left 2007   neg  . BREAST BIOPSY Left 12/14/2016   left breast US core path pending  . BREAST EXCISIONAL BIOPSY Left   . BREAST LUMPECTOMY Left 01/01/2017    INVASIVE MAMMARY CARCINOMA/Grade 3  . CARDIAC CATHETERIZATION N/A 01/18/2015   Procedure: Left Heart Cath and Coronary Angiography;  Surgeon: Teodoro Spray, MD;  Location: [facility];  Service: Cardiovascular;  Laterality: N/A;  . CATARACT EXTRACTION W/PHACO Right 12/15/2015   Procedure: CATARACT EXTRACTION PHACO AND INTRAOCULAR LENS PLACEMENT (IOC);  Surgeon: Estill Cotta, MD;  Location: [facility] OR[facility];  Service: Ophthalmology;  Laterality: Right;  [place] 01:20 AP% 23.9 CDE 35.49 Fluid pack lot # [id number]  . CATARACT EXTRACTION W/PHACO Left 12/29/2015   Procedure: CATARACT EXTRACTION PHACO AND INTRAOCULAR LENS PLACEMENT ([place]);  Surgeon: Estill Cotta, MD;  Location: [facility] ORS;  Service: Ophthalmology;  Laterality: Left;  [place]  01:20 AP% 25.1 CDE 32.86 Fluid pack lot # [id number]  . COLON SURGERY    . COLONOSCOPY WITH PROPOFOL N/A 02/21/2016   Procedure: COLONOSCOPY WITH PROPOFOL;  Surgeon: Manya Silvas, MD;  Location: [facility];  Service: Endoscopy;  Laterality: N/A;  . CORONARY ANGIOPLASTY    . DILATION AND  CURETTAGE OF UTERUS    . OOPHORECTOMY Bilateral   . PARTIAL MASTECTOMY WITH NEEDLE LOCALIZATION Left 01/01/2017   Procedure: PARTIAL MASTECTOMY WITH NEEDLE LOCALIZATION;  Surgeon: Herbert Pun, MD;  Location: [facility] ORS;  Service: General;  Laterality: Left;  . PORTACATH PLACEMENT Right 01/01/2017   Procedure: INSERTION PORT-A-CATH;  Surgeon: Herbert Pun, MD;  Location: [facility] ORS;  Service: General;  Laterality: Right;  . SENTINEL NODE BIOPSY Left 01/01/2017   Procedure: SENTINEL NODE BIOPSY;  Surgeon: Herbert Pun, MD;  Location: [facility] ORS;  Service: General;  Laterality: Left;  . TONSILLECTOMY    . VAGINAL HYSTERECTOMY      Social History   Social History  . Marital status: Married    Spouse name: N/A  . Number of children: N/A  . Years of education: N/A   Occupational History  . Not on file.   Social History Main Topics  . Smoking status: Former Smoker    Packs/day: 1.00    Years: 33.00    Quit date: 10/17/1987  . Smokeless tobacco: Never Used  . Alcohol use No  . Drug use: No  . Sexual activity:  Yes   Other Topics Concern  . Not on file   Social History Narrative  . No narrative on file    Family History  Problem Relation Age of Onset  . Diabetes Mother   . Diabetes Father   . Colon cancer Sister   . Diabetes Sister   . Diabetes Maternal Aunt   . Colon cancer Paternal Grandfather   . Lung cancer Brother   . Breast cancer Neg Hx   . Ovarian cancer Neg Hx      Current Outpatient Prescriptions:  .  acetaminophen (TYLENOL) 325 MG tablet, Take 650 mg by mouth every 6 (six) hours as needed (for headaches.). , Disp: , Rfl:  .  ALPRAZolam (XANAX) 0.25 MG tablet, Take 0.125-0.25 mg by mouth 2 (two) times daily as needed for anxiety or sleep. , Disp: , Rfl:  .  aspirin EC 325 MG tablet, Take 325 mg by mouth daily with breakfast. , Disp: , Rfl:  .  atenolol (TENORMIN) 50 MG tablet, Take 50 mg by mouth daily with breakfast. , Disp: , Rfl:  .   calcium citrate-vitamin D (CITRACAL+D) 315-200 MG-UNIT tablet, Take 1 tablet by mouth 2 (two) times daily., Disp: , Rfl:  .  FLUoxetine (PROZAC) 40 MG capsule, Take 40 mg by mouth at bedtime., Disp: , Rfl:  .  fluticasone (FLONASE) 50 MCG/ACT nasal spray, Place 2 sprays into both nostrils daily as needed for allergies. , Disp: , Rfl:  .  hydrochlorothiazide (HYDRODIURIL) 25 MG tablet, Take 25 mg by mouth daily. , Disp: , Rfl:  .  lansoprazole (PREVACID) 30 MG capsule, Take 30 mg by mouth daily before breakfast. , Disp: , Rfl:  .  lidocaine-prilocaine (EMLA) cream, Apply to affected area once, Disp: 30 g, Rfl: 3 .  loratadine (CLARITIN) 10 MG tablet, Take 10 mg by mouth daily as needed for allergies. , Disp: , Rfl:  .  LORazepam (ATIVAN) 0.5 MG tablet, Take 1 tablet (0.5 mg total) by mouth every 6 (six) hours as needed (Nausea or vomiting)., Disp: 30 tablet, Rfl: 0 .  Magnesium 250 MG TABS, Take 250 mg by mouth daily., Disp: , Rfl:  .  Multiple Vitamins-Minerals (PRESERVISION AREDS 2) CAPS, Take 1 capsule by mouth 2 (two) times daily. , Disp: , Rfl:  .  ondansetron (ZOFRAN) 8 MG tablet, Take 1 tablet (8 mg total) by mouth 2 (two) times daily as needed (Nausea or vomiting)., Disp: 30 tablet, Rfl: 1 .  prochlorperazine (COMPAZINE) 10 MG tablet, Take 1 tablet (10 mg total) by mouth every 6 (six) hours as needed (Nausea or vomiting)., Disp: 30 tablet, Rfl: 1 .  simvastatin (ZOCOR) 20 MG tablet, Take 20 mg by mouth daily at 8 pm., Disp: , Rfl:  .  telmisartan (MICARDIS) 80 MG tablet, Take 40 mg by mouth daily with breakfast., Disp: , Rfl:   Physical exam:  Vitals:   01/16/17 0828  BP: (!) 159/77  Pulse: 71  Resp: 14  Temp: (!) 96.3 F (35.7 C)  TempSrc: Tympanic  Weight: 125 lb (56.7 kg)  Height: _0  (1.575 m)   Physical Exam  Constitutional: She is oriented to person, place, and time and well-developed, well-nourished, and in no distress.  HENT:  Head: Normocephalic and atraumatic.  Eyes:  Pupils are equal, round, and reactive to light. EOM are normal.  Neck: Normal range of motion.  Cardiovascular: Normal rate, regular rhythm and normal heart sounds.   Pulmonary/Chest: Effort normal and breath sounds normal.  Left chest wall port in place  Abdominal: Soft. Bowel sounds are normal.  Neurological: She is alert and oriented to person, place, and time.  Skin: Skin is warm and dry.     CMP Latest Ref Rng & Units 01/16/2017  Glucose 65 - 99 mg/dL 137(H)  BUN 6 - 20 mg/dL 13  Creatinine 0.44 - 1.00 mg/dL 0.80  Sodium 135 - 145 mmol/L 134(L)  Potassium 3.5 - 5.1 mmol/L 3.6  Chloride 101 - 111 mmol/L 97(L)  CO2 22 - 32 mmol/L 28  Calcium 8.9 - 10.3 mg/dL 9.0  Total Protein 6.5 - 8.1 g/dL 6.9  Total Bilirubin 0.3 - 1.2 mg/dL 0.6  Alkaline Phos 38 - 126 U/L 70  AST 15 - 41 U/L 29  ALT 14 - 54 U/L 23   CBC Latest Ref Rng & Units 01/16/2017  WBC 3.6 - 11.0 K/uL 4.9  Hemoglobin 12.0 - 16.0 g/dL 12.3  Hematocrit 35.0 - 47.0 % 36.5  Platelets 150 - 440 K/uL 218    No images are attached to the encounter.  Nm Cardiac Muga Rest  Result Date: 12/27/2016 CLINICAL DATA:  Breast cancer.  Pre chemotherapy assessment EXAM: NUCLEAR MEDICINE CARDIAC BLOOD POOL IMAGING (MUGA) TECHNIQUE: Cardiac multi-gated acquisition was performed at rest following intravenous injection of Tc-38mlabeled red blood cells. RADIOPHARMACEUTICALS:  22.0 mCi Tc-969mDP in-vitro labeled red blood cells IV COMPARISON:  None. FINDINGS: The left ventricular ejection fraction was equal to 64.5% which was obtained with a heart rate of 70 beats per minute. No wall motion abnormality. IMPRESSION: 1. Left ventricular ejection fraction equals 64.5%. Electronically Signed   By: TaKerby Moors.D.   On: 12/27/2016 15:17   Nm Sentinel Node Injection  Result Date: 01/01/2017 CLINICAL DATA:  Left breast cancer. EXAM: NUCLEAR MEDICINE BREAST LYMPHOSCINTIGRAPHY left breast TECHNIQUE: Intradermal injection of  radiopharmaceutical was performed at the 12 o'clock, 3 o'clock, 6 o'clock, and 9 o'clock positions around the left nipple. The patient was then sent to the operating room where the sentinel node(s) were identified and removed by the surgeon. RADIOPHARMACEUTICALS:  Total of 1 mCi Millipore-filtered Technetium-9923mlfur colloid, injected in four aliquots of 0.25 mCi each. IMPRESSION: Uncomplicated intradermal injection of a total of 1 mCi Technetium-[id number] colloid for purposes of sentinel node identification. Electronically Signed   By: David  JordMartinique.   On: 01/01/2017 09:49   Mm Breast Surgical Specimen  Result Date: 01/01/2017 CLINICAL DATA:  Status post left breast surgical excision today. EXAM: SPECIMEN RADIOGRAPH OF THE LEFT BREAST COMPARISON:  Previous exam(s). FINDINGS: Status post excision of the left breast. The wire tip and biopsy marker clip are present within the specimen. Findings discussed with the OR staff during the procedure. IMPRESSION: Specimen radiograph of the left breast. Electronically Signed   By: StanFranki Cabot.   On: 01/01/2017 12:55   Dg Chest Port 1 View  Result Date: 01/01/2017 CLINICAL DATA:  Status post port placement. EXAM: PORTABLE CHEST 1 VIEW COMPARISON:  12/18/2015 FINDINGS: Right jugular Port-A-Cath has been placed. This is a CT injectable port. Catheter tip in the lower SVC region. Coarse lung markings are chronic. Negative for a pneumothorax. Heart size is normal. The trachea is midline. IMPRESSION: Placement of right jugular Port-A-Cath. Catheter tip in the lower SVC. Negative for a pneumothorax. Electronically Signed   By: AdamMarkus Daft.   On: 01/01/2017 14:22   Dg C-arm 1-60 Min-no Report  Result Date: 01/01/2017 Fluoroscopy was utilized by the requesting physician.  No radiographic interpretation.   Mm Lt Plc Breast Loc Dev   1st Lesion  Inc Mammo Guide  Result Date: 01/01/2017 CLINICAL DATA:  Patient with left breast cancer scheduled for breast  conservation surgery later today requiring preoperative needle localization. EXAM: NEEDLE LOCALIZATION OF THE LEFT BREAST WITH MAMMO GUIDANCE COMPARISON:  Previous exams. FINDINGS: Patient presents for needle localization prior to breast conservation surgery. I met with the patient and we discussed the procedure of needle localization including benefits and alternatives. We discussed the high likelihood of a successful procedure. We discussed the risks of the procedure, including infection, bleeding, tissue injury, and further surgery. Informed, written consent was given. The usual time-out protocol was performed immediately prior to the procedure. Using mammographic guidance, sterile technique, 1% lidocaine and a 9 cm modified Kopans needle, the coil shaped clip within the lower inner quadrant of the left breast was localized using medial approach. The images were marked for Dr. Windell Moment. IMPRESSION: Needle localization left breast. No apparent complications. Electronically Signed   By: Franki Cabot M.D.   On: 01/01/2017 08:31     Assessment and plan- Patient is a 76 y.o. female newly diagnosed pathological prognostic stage IA pT1c1p N0 cM0 invasive mammary carcinoma of the left breast ER negative PR positive and HER-2 +3 on IHC s/p lumpectomy here for on treatment assessment prior to cycle 1 of weekly taxol and herceptin  Counts ok to proceed with cycle 1 of taxol herceptin today. I will see her back in 1 weeks time with cbc, cmp prior to cycle 2. Plan is for total of 12 cycles. Baseline MUGA scan showed EF of 64.5%. I again went over side efefcts of chemo and herceptin today. She understands and agrees to proceed  She will try zofran first for nausea. If it does not work well in 24 hours, she will switch to compazine. Hold off on ativan     Visit Diagnosis 1. Malignant neoplasm of lower-inner quadrant of left breast in female, estrogen receptor positive ([place])   2. Encounter for antineoplastic  chemotherapy      Dr. Randa Evens, MD, MPH [facility] at [facility] Pager- [phone] 01/16/2017 8:58 AM

## 2017-01-16 ENCOUNTER — Encounter: Payer: Self-pay | Admitting: Oncology

## 2017-01-16 ENCOUNTER — Inpatient Hospital Stay (HOSPITAL_BASED_OUTPATIENT_CLINIC_OR_DEPARTMENT_OTHER): Payer: Medicare HMO | Admitting: Oncology

## 2017-01-16 ENCOUNTER — Inpatient Hospital Stay: Payer: Medicare HMO

## 2017-01-16 VITALS — BP 142/82 | HR 60 | Temp 97.0°F | Resp 18 | Wt 125.0 lb

## 2017-01-16 VITALS — BP 159/77 | HR 71 | Temp 96.3°F | Resp 14 | Ht 62.0 in | Wt 125.0 lb

## 2017-01-16 DIAGNOSIS — Z9012 Acquired absence of left breast and nipple: Secondary | ICD-10-CM

## 2017-01-16 DIAGNOSIS — Z87891 Personal history of nicotine dependence: Secondary | ICD-10-CM

## 2017-01-16 DIAGNOSIS — C50312 Malignant neoplasm of lower-inner quadrant of left female breast: Secondary | ICD-10-CM | POA: Diagnosis not present

## 2017-01-16 DIAGNOSIS — I341 Nonrheumatic mitral (valve) prolapse: Secondary | ICD-10-CM

## 2017-01-16 DIAGNOSIS — R51 Headache: Secondary | ICD-10-CM | POA: Diagnosis not present

## 2017-01-16 DIAGNOSIS — N3010 Interstitial cystitis (chronic) without hematuria: Secondary | ICD-10-CM

## 2017-01-16 DIAGNOSIS — M545 Low back pain: Secondary | ICD-10-CM | POA: Diagnosis not present

## 2017-01-16 DIAGNOSIS — E7800 Pure hypercholesterolemia, unspecified: Secondary | ICD-10-CM

## 2017-01-16 DIAGNOSIS — I10 Essential (primary) hypertension: Secondary | ICD-10-CM

## 2017-01-16 DIAGNOSIS — Z7982 Long term (current) use of aspirin: Secondary | ICD-10-CM

## 2017-01-16 DIAGNOSIS — Z5112 Encounter for antineoplastic immunotherapy: Secondary | ICD-10-CM | POA: Diagnosis not present

## 2017-01-16 DIAGNOSIS — N941 Unspecified dyspareunia: Secondary | ICD-10-CM

## 2017-01-16 DIAGNOSIS — M810 Age-related osteoporosis without current pathological fracture: Secondary | ICD-10-CM

## 2017-01-16 DIAGNOSIS — F419 Anxiety disorder, unspecified: Secondary | ICD-10-CM

## 2017-01-16 DIAGNOSIS — K219 Gastro-esophageal reflux disease without esophagitis: Secondary | ICD-10-CM | POA: Diagnosis not present

## 2017-01-16 DIAGNOSIS — J45909 Unspecified asthma, uncomplicated: Secondary | ICD-10-CM | POA: Diagnosis not present

## 2017-01-16 DIAGNOSIS — Z8673 Personal history of transient ischemic attack (TIA), and cerebral infarction without residual deficits: Secondary | ICD-10-CM

## 2017-01-16 DIAGNOSIS — Z170 Estrogen receptor positive status [ER+]: Principal | ICD-10-CM

## 2017-01-16 DIAGNOSIS — Z5111 Encounter for antineoplastic chemotherapy: Secondary | ICD-10-CM | POA: Diagnosis not present

## 2017-01-16 DIAGNOSIS — K449 Diaphragmatic hernia without obstruction or gangrene: Secondary | ICD-10-CM

## 2017-01-16 DIAGNOSIS — K6289 Other specified diseases of anus and rectum: Secondary | ICD-10-CM | POA: Diagnosis not present

## 2017-01-16 DIAGNOSIS — Z8719 Personal history of other diseases of the digestive system: Secondary | ICD-10-CM

## 2017-01-16 DIAGNOSIS — Z800 Family history of malignant neoplasm of digestive organs: Secondary | ICD-10-CM

## 2017-01-16 DIAGNOSIS — K589 Irritable bowel syndrome without diarrhea: Secondary | ICD-10-CM

## 2017-01-16 DIAGNOSIS — Z171 Estrogen receptor negative status [ER-]: Secondary | ICD-10-CM | POA: Diagnosis not present

## 2017-01-16 DIAGNOSIS — Z79899 Other long term (current) drug therapy: Secondary | ICD-10-CM

## 2017-01-16 DIAGNOSIS — D649 Anemia, unspecified: Secondary | ICD-10-CM

## 2017-01-16 LAB — COMPREHENSIVE METABOLIC PANEL
ALT: 23 U/L (ref 14–54)
AST: 29 U/L (ref 15–41)
Albumin: 3.7 g/dL (ref 3.5–5.0)
Alkaline Phosphatase: 70 U/L (ref 38–126)
Anion gap: 9 (ref 5–15)
BUN: 13 mg/dL (ref 6–20)
CO2: 28 mmol/L (ref 22–32)
Calcium: 9 mg/dL (ref 8.9–10.3)
Chloride: 97 mmol/L — ABNORMAL LOW (ref 101–111)
Creatinine, Ser: 0.8 mg/dL (ref 0.44–1.00)
GFR calc Af Amer: >60 mL/min (ref >60)
GFR calc non Af Amer: >60 mL/min (ref >60)
Glucose, Bld: 137 mg/dL — ABNORMAL HIGH (ref 65–99)
Potassium: 3.6 mmol/L (ref 3.5–5.1)
Sodium: 134 mmol/L — ABNORMAL LOW (ref 135–145)
Total Bilirubin: 0.6 mg/dL (ref 0.3–1.2)
Total Protein: 6.9 g/dL (ref 6.5–8.1)

## 2017-01-16 LAB — CBC WITH DIFFERENTIAL/PLATELET
Basophils Absolute: 0 10*3/uL (ref 0–0.1)
Basophils Relative: 1 %
Eosinophils Absolute: 0.2 10*3/uL (ref 0–0.7)
Eosinophils Relative: 5 %
HCT: 36.5 % (ref 35.0–47.0)
Hemoglobin: 12.3 g/dL (ref 12.0–16.0)
Lymphocytes Relative: 24 %
Lymphs Abs: 1.2 10*3/uL (ref 1.0–3.6)
MCH: 30.1 pg (ref 26.0–34.0)
MCHC: 33.8 g/dL (ref 32.0–36.0)
MCV: 89.2 fL (ref 80.0–100.0)
Monocytes Absolute: 0.4 10*3/uL (ref 0.2–0.9)
Monocytes Relative: 9 %
Neutro Abs: 3 10*3/uL (ref 1.4–6.5)
Neutrophils Relative %: 61 %
Platelets: 218 10*3/uL (ref 150–440)
RBC: 4.1 MIL/uL (ref 3.80–5.20)
RDW: 13.2 % (ref 11.5–14.5)
WBC: 4.9 10*3/uL (ref 3.6–11.0)

## 2017-01-16 MED ORDER — ACETAMINOPHEN 325 MG PO TABS
650.0000 mg | ORAL_TABLET | Freq: Once | ORAL | Status: AC
  Administered 2017-01-16: 650 mg via ORAL
  Filled 2017-01-16: qty 2

## 2017-01-16 MED ORDER — PACLITAXEL CHEMO INJECTION 300 MG/50ML
80.0000 mg/m2 | Freq: Once | INTRAVENOUS | Status: AC
  Administered 2017-01-16: 126 mg via INTRAVENOUS
  Filled 2017-01-16: qty 21

## 2017-01-16 MED ORDER — FAMOTIDINE IN NACL 20-0.9 MG/50ML-% IV SOLN
20.0000 mg | Freq: Once | INTRAVENOUS | Status: AC
  Administered 2017-01-16: 20 mg via INTRAVENOUS
  Filled 2017-01-16: qty 50

## 2017-01-16 MED ORDER — SODIUM CHLORIDE 0.9 % IV SOLN
20.0000 mg | Freq: Once | INTRAVENOUS | Status: AC
  Administered 2017-01-16: 20 mg via INTRAVENOUS
  Filled 2017-01-16: qty 2

## 2017-01-16 MED ORDER — SODIUM CHLORIDE 0.9 % IV SOLN
Freq: Once | INTRAVENOUS | Status: AC
  Administered 2017-01-16: 09:00:00 via INTRAVENOUS
  Filled 2017-01-16: qty 1000

## 2017-01-16 MED ORDER — DIPHENHYDRAMINE HCL 50 MG/ML IJ SOLN
50.0000 mg | Freq: Once | INTRAMUSCULAR | Status: AC
  Administered 2017-01-16: 50 mg via INTRAVENOUS
  Filled 2017-01-16: qty 1

## 2017-01-16 MED ORDER — HEPARIN SOD (PORK) LOCK FLUSH 100 UNIT/ML IV SOLN
500.0000 [IU] | Freq: Once | INTRAVENOUS | Status: AC | PRN
  Administered 2017-01-16: 500 [IU]

## 2017-01-16 MED ORDER — TRASTUZUMAB CHEMO 150 MG IV SOLR
4.0000 mg/kg | Freq: Once | INTRAVENOUS | Status: AC
  Administered 2017-01-16: 231 mg via INTRAVENOUS
  Filled 2017-01-16: qty 11

## 2017-01-16 NOTE — Progress Notes (Signed)
Patient here for follow up and labs; new treatment with Taxol and Herceptin. She denies having any pain. She  Complains of having a runny nose which she thinks is due to seasonal allergies.  She also complains of intermittent, severe itching on her left shoulder, mostly at night, which she can't attribute a cause. She has stopped taking her calcium with vitamin D because she thinks they told her not to take it in chemo class; she will ask Dr. Janese Banks about resuming it today. She states that she is nervous about starting her treatment today.

## 2017-01-22 NOTE — Progress Notes (Addendum)
Hematology/Oncology Consult note [facility]  [phone] [phone]  Patient Care Team: Rusty Aus, MD as PCP - General (Internal Medicine)   Name of the patient: Doris Barnes  [id number]  [birth year]   Date of visit: 01/22/17   Diagnosis- Stage IA invasive mammary carcinoma of the left breast ER negative, PR weakly positive and HER-2 positive  Chief complaint/ Reason for visit- on treatment assessment prior to cycle 2 of taxol herceptin  Heme/Onc history: 1. Patient is a 76 year old female who underwent bilateral screening mammogram on 11/28/2016 which showed a possible mass and calcifications in her left breast. This was followed by diagnostic mammogram and an ultrasound of the left breast which showed: 1.4 x 1.2 x 1.3 cm irregular mass in the left breast at the 6:30 position 3 cm from the nipple. On magnification views the overall extent of the mass and calcifications measures 1.9 cm. Left axilla was evaluated with ultrasound and showed no enlarged and morphologically abnormal lymph nodes.   2. She underwent core biopsy of the left breast mass which showed: Invasive mammary carcinoma, grade 3. No DCIS or lymphovascular invasion. ER was negative and PR was 11-50% positive and HER-2 was +3 on IHC  3[name] who plans for elective lumpectomy with sentinel lymph node biopsy pending oncology opinion.  4. Menarche at the age of 63 years. G5 P5 L5. She did have twoprior biopsiesof her left breast which did not reveal any malignancy. No family history of breast or ovarian cancer. Her sister and maternal grandfather had colon cancer. She is considerably anxious today at the thought of getting chemotherapy. She does have a history of mitral valve prolapse and states that she has "irregular heartbeat"for which she is on atenolol. She had a Holter monitor in the past but was not found to have any  arrhythmia.Baseline MUGA scan showed a normal EF of 64.5%  5. Patient underwent lumpectomy on 01/01/2017 which showed invasive mammary carcinoma, size of tumor was 14 mm, grade 3 with negative margins. 0 out of one lymph nodes was positive for malignancy. There was associated DCIS. Lymphovascular invasion was negative. ER negative, PR 11-50% positive and HER-2/neu +3  Interval history- she felt quite sedated with benadryl with cycle 1 and slept a lot for 2 days after and also had on and off headaches. She still gets intermittent headaches that are self limiting. Also reports pain in her lower back and rectum that has been ongoing for a week. Pain is relieved with a bowel movement. No fever or blood in stool. No diarrhea but did have frequent well formed bowel movement  ECOG PS- 0 Pain scale- 0  Review of systems- Review of Systems  Constitutional: Negative for chills, fever, malaise/fatigue and weight loss.  HENT: Negative for congestion, ear discharge and nosebleeds.   Eyes: Negative for blurred vision.  Respiratory: Negative for cough, hemoptysis, sputum production, shortness of breath and wheezing.   Cardiovascular: Negative for chest pain, palpitations, orthopnea and claudication.  Gastrointestinal: Negative for abdominal pain, blood in stool, constipation, diarrhea, heartburn, melena, nausea and vomiting.       Rectal pain  Genitourinary: Negative for dysuria, flank pain, frequency, hematuria and urgency.  Musculoskeletal: Negative for back pain, joint pain and myalgias.  Skin: Negative for rash.  Neurological: Negative for dizziness, tingling, focal weakness, seizures, weakness and headaches.  Endo/Heme/Allergies: Does not bruise/bleed easily.  Psychiatric/Behavioral: Negative for depression and suicidal ideas. The patient does  not have insomnia.      Allergies  Allergen Reactions  . Imipramine Pamoate Shortness Of Breath  . Bisphosphonates Nausea Only  . Epinephrine Other (See  Comments)    Difficulty breathing  . Prednisone Palpitations  . Sulfa Antibiotics Rash    Face turns red and stings     Past Medical History:  Diagnosis Date  . Anemia   . Anxiety   . Asthma   . Cancer (HCC) skin  . Chronic vulvitis   . Colon polyp   . Cystocele   . Diverticulitis   . Diverticulitis   . Diverticulosis   . Diverticulosis   . Dyspareunia, female   . Dyspnea   . Dysrhythmia   . Fibrocystic disease of both breasts   . Gastritis   . GERD (gastroesophageal reflux disease)    gastritis also  . Hemorrhoids   . History of hiatal hernia   . Hypercholesteremia   . Hypertension   . IBS (irritable bowel syndrome)   . IC (interstitial cystitis)   . Migraine   . Mitral valve prolapse   . OP (osteoporosis)   . Positional vertigo   . Rectocele   . Squamous cell carcinoma   . Stroke (HCC)    TIA  . Vaginal atrophy      Past Surgical History:  Procedure Laterality Date  . APPENDECTOMY    . BREAST BIOPSY Left 1998   core bx- neg  . BREAST BIOPSY Left 2007   neg  . BREAST BIOPSY Left 12/14/2016   left breast us core path pending  . BREAST EXCISIONAL BIOPSY Left   . BREAST LUMPECTOMY Left 01/01/2017    INVASIVE MAMMARY CARCINOMA/Grade 3  . CARDIAC CATHETERIZATION N/A 01/18/2015   Procedure: Left Heart Cath and Coronary Angiography;  Surgeon: Kenneth A Fath, MD;  Location: [facility] INVASIVE CV LAB;  Service: Cardiovascular;  Laterality: N/A;  . CATARACT EXTRACTION W/PHACO Right 12/15/2015   Procedure: CATARACT EXTRACTION PHACO AND INTRAOCULAR LENS PLACEMENT (IOC);  Surgeon: Steven Dingeldein, MD;  Location: [facility] ORS;  Service: Ophthalmology;  Laterality: Right;  US 01:20 AP% 23.9 CDE 35.49 Fluid pack lot # [id number]  . CATARACT EXTRACTION W/PHACO Left 12/29/2015   Procedure: CATARACT EXTRACTION PHACO AND INTRAOCULAR LENS PLACEMENT (IOC);  Surgeon: Steven Dingeldein, MD;  Location: [facility] ORS;  Service: Ophthalmology;  Laterality: Left;  US  01:20 AP% 25.1 CDE  32.86 Fluid pack lot # [id number]  . COLON SURGERY    . COLONOSCOPY WITH PROPOFOL N/A 02/21/2016   Procedure: COLONOSCOPY WITH PROPOFOL;  Surgeon: Robert T Elliott, MD;  Location: [facility] ENDOSCOPY;  Service: Endoscopy;  Laterality: N/A;  . CORONARY ANGIOPLASTY    . DILATION AND CURETTAGE OF UTERUS    . OOPHORECTOMY Bilateral   . PARTIAL MASTECTOMY WITH NEEDLE LOCALIZATION Left 01/01/2017   Procedure: PARTIAL MASTECTOMY WITH NEEDLE LOCALIZATION;  Surgeon: Cintron-Diaz, Edgardo, MD;  Location: [facility] ORS;  Service: General;  Laterality: Left;  . PORTACATH PLACEMENT Right 01/01/2017   Procedure: INSERTION PORT-A-CATH;  Surgeon: Cintron-Diaz, Edgardo, MD;  Location: [facility] ORS;  Service: General;  Laterality: Right;  . SENTINEL NODE BIOPSY Left 01/01/2017   Procedure: SENTINEL NODE BIOPSY;  Surgeon: Cintron-Diaz, Edgardo, MD;  Location: [facility] ORS;  Service: General;  Laterality: Left;  . TONSILLECTOMY    . VAGINAL HYSTERECTOMY      Social History   Social History  . Marital status: Married    Spouse name: N/A  . Number of children: N/A  . Years of education: N/A     Occupational History  . Not on file.   Social History Main Topics  . Smoking status: Former Smoker    Packs/day: 1.00    Years: 33.00    Quit date: 10/17/1987  . Smokeless tobacco: Never Used  . Alcohol use No  . Drug use: No  . Sexual activity: Yes   Other Topics Concern  . Not on file   Social History Narrative  . No narrative on file    Family History  Problem Relation Age of Onset  . Diabetes Mother   . Diabetes Father   . Colon cancer Sister   . Diabetes Sister   . Diabetes Maternal Aunt   . Colon cancer Paternal Grandfather   . Lung cancer Brother   . Breast cancer Neg Hx   . Ovarian cancer Neg Hx      Current Outpatient Prescriptions:  .  acetaminophen (TYLENOL) 325 MG tablet, Take 650 mg by mouth every 6 (six) hours as needed (for headaches.). , Disp: , Rfl:  .  ALPRAZolam (XANAX) 0.25 MG tablet, Take  0.125-0.25 mg by mouth 2 (two) times daily as needed for anxiety or sleep. , Disp: , Rfl:  .  aspirin EC 325 MG tablet, Take 325 mg by mouth daily with breakfast. , Disp: , Rfl:  .  atenolol (TENORMIN) 50 MG tablet, Take 50 mg by mouth daily with breakfast. , Disp: , Rfl:  .  calcium citrate-vitamin D (CITRACAL+D) 315-200 MG-UNIT tablet, Take 1 tablet by mouth 2 (two) times daily., Disp: , Rfl:  .  FLUoxetine (PROZAC) 40 MG capsule, Take 40 mg by mouth at bedtime., Disp: , Rfl:  .  fluticasone (FLONASE) 50 MCG/ACT nasal spray, Place 2 sprays into both nostrils daily as needed for allergies. , Disp: , Rfl:  .  hydrochlorothiazide (HYDRODIURIL) 25 MG tablet, Take 25 mg by mouth daily. , Disp: , Rfl:  .  lansoprazole (PREVACID) 30 MG capsule, Take 30 mg by mouth daily before breakfast. , Disp: , Rfl:  .  lidocaine-prilocaine (EMLA) cream, Apply to affected area once, Disp: 30 g, Rfl: 3 .  loratadine (CLARITIN) 10 MG tablet, Take 10 mg by mouth daily as needed for allergies. , Disp: , Rfl:  .  LORazepam (ATIVAN) 0.5 MG tablet, Take 1 tablet (0.5 mg total) by mouth every 6 (six) hours as needed (Nausea or vomiting). (Patient not taking: Reported on 01/16/2017), Disp: 30 tablet, Rfl: 0 .  Magnesium 250 MG TABS, Take 250 mg by mouth daily., Disp: , Rfl:  .  Multiple Vitamins-Minerals (PRESERVISION AREDS 2) CAPS, Take 1 capsule by mouth 2 (two) times daily. , Disp: , Rfl:  .  ondansetron (ZOFRAN) 8 MG tablet, Take 1 tablet (8 mg total) by mouth 2 (two) times daily as needed (Nausea or vomiting). (Patient not taking: Reported on 01/16/2017), Disp: 30 tablet, Rfl: 1 .  prochlorperazine (COMPAZINE) 10 MG tablet, Take 1 tablet (10 mg total) by mouth every 6 (six) hours as needed (Nausea or vomiting). (Patient not taking: Reported on 01/16/2017), Disp: 30 tablet, Rfl: 1 .  simvastatin (ZOCOR) 20 MG tablet, Take 20 mg by mouth daily at 8 pm., Disp: , Rfl:  .  telmisartan (MICARDIS) 80 MG tablet, Take 40 mg by  mouth daily with breakfast., Disp: , Rfl:   Physical exam:  Vitals:   01/23/17 0835  BP: (!) 145/79  Pulse: 65  Resp: 14  Temp: (!) 96.9 F (36.1 C)  TempSrc: Tympanic  Weight: 126 lb (57.2 kg)     Physical Exam  Constitutional: She is oriented to person, place, and time and well-developed, well-nourished, and in no distress.  HENT:  Head: Normocephalic and atraumatic.  Eyes: Pupils are equal, round, and reactive to light. EOM are normal.  Neck: Normal range of motion.  Cardiovascular: Normal rate, regular rhythm and normal heart sounds.   Pulmonary/Chest: Effort normal and breath sounds normal.  Abdominal: Soft. Bowel sounds are normal.  No TTP in RLQ  Neurological: She is alert and oriented to person, place, and time.  Skin: Skin is warm and dry.     CMP Latest Ref Rng & Units 01/16/2017  Glucose 65 - 99 mg/dL 137(H)  BUN 6 - 20 mg/dL 13  Creatinine 0.44 - 1.00 mg/dL 0.80  Sodium 135 - 145 mmol/L 134(L)  Potassium 3.5 - 5.1 mmol/L 3.6  Chloride 101 - 111 mmol/L 97(L)  CO2 22 - 32 mmol/L 28  Calcium 8.9 - 10.3 mg/dL 9.0  Total Protein 6.5 - 8.1 g/dL 6.9  Total Bilirubin 0.3 - 1.2 mg/dL 0.6  Alkaline Phos 38 - 126 U/L 70  AST 15 - 41 U/L 29  ALT 14 - 54 U/L 23   CBC Latest Ref Rng & Units 01/23/2017  WBC 3.6 - 11.0 K/uL 3.4(L)  Hemoglobin 12.0 - 16.0 g/dL 11.4(L)  Hematocrit 35.0 - 47.0 % 33.3(L)  Platelets 150 - 440 K/uL 208    No images are attached to the encounter.  Nm Cardiac Muga Rest  Result Date: 12/27/2016 CLINICAL DATA:  Breast cancer.  Pre chemotherapy assessment EXAM: NUCLEAR MEDICINE CARDIAC BLOOD POOL IMAGING (MUGA) TECHNIQUE: Cardiac multi-gated acquisition was performed at rest following intravenous injection of Tc-25mlabeled red blood cells. RADIOPHARMACEUTICALS:  22.0 mCi Tc-978mDP in-vitro labeled red blood cells IV COMPARISON:  None. FINDINGS: The left ventricular ejection fraction was equal to 64.5% which was obtained with a heart rate of 70  beats per minute. No wall motion abnormality. IMPRESSION: 1. Left ventricular ejection fraction equals 64.5%. Electronically Signed   By: TaKerby Moors.D.   On: 12/27/2016 15:17   Nm Sentinel Node Injection  Result Date: 01/01/2017 CLINICAL DATA:  Left breast cancer. EXAM: NUCLEAR MEDICINE BREAST LYMPHOSCINTIGRAPHY left breast TECHNIQUE: Intradermal injection of radiopharmaceutical was performed at the 12 o'clock, 3 o'clock, 6 o'clock, and 9 o'clock positions around the left nipple. The patient was then sent to the operating room where the sentinel node(s) were identified and removed by the surgeon. RADIOPHARMACEUTICALS:  Total of 1 mCi Millipore-filtered Technetium-9940mlfur colloid, injected in four aliquots of 0.25 mCi each. IMPRESSION: Uncomplicated intradermal injection of a total of 1 mCi Technetium-[id number] colloid for purposes of sentinel node identification. Electronically Signed   By: David  JordMartinique.   On: 01/01/2017 09:49   Mm Breast Surgical Specimen  Result Date: 01/01/2017 CLINICAL DATA:  Status post left breast surgical excision today. EXAM: SPECIMEN RADIOGRAPH OF THE LEFT BREAST COMPARISON:  Previous exam(s). FINDINGS: Status post excision of the left breast. The wire tip and biopsy marker clip are present within the specimen. Findings discussed with the OR staff during the procedure. IMPRESSION: Specimen radiograph of the left breast. Electronically Signed   By: StanFranki Cabot.   On: 01/01/2017 12:55   Dg Chest Port 1 View  Result Date: 01/01/2017 CLINICAL DATA:  Status post port placement. EXAM: PORTABLE CHEST 1 VIEW COMPARISON:  12/18/2015 FINDINGS: Right jugular Port-A-Cath has been placed. This is a CT injectable port. Catheter tip in the lower SVC region. Coarse lung  markings are chronic. Negative for a pneumothorax. Heart size is normal. The trachea is midline. IMPRESSION: Placement of right jugular Port-A-Cath. Catheter tip in the lower SVC. Negative for a  pneumothorax. Electronically Signed   By: Adam  Henn M.D.   On: 01/01/2017 14:22   Dg C-arm 1-60 Min-no Report  Result Date: 01/01/2017 Fluoroscopy was utilized by the requesting physician.  No radiographic interpretation.   Mm Lt Plc Breast Loc Dev   1st Lesion  Inc Mammo Guide  Result Date: 01/01/2017 CLINICAL DATA:  Patient with left breast cancer scheduled for breast conservation surgery later today requiring preoperative needle localization. EXAM: NEEDLE LOCALIZATION OF THE LEFT BREAST WITH MAMMO GUIDANCE COMPARISON:  Previous exams. FINDINGS: Patient presents for needle localization prior to breast conservation surgery. I met with the patient and we discussed the procedure of needle localization including benefits and alternatives. We discussed the high likelihood of a successful procedure. We discussed the risks of the procedure, including infection, bleeding, tissue injury, and further surgery. Informed, written consent was given. The usual time-out protocol was performed immediately prior to the procedure. Using mammographic guidance, sterile technique, 1% lidocaine and a 9 cm modified Kopans needle, the coil shaped clip within the lower inner quadrant of the left breast was localized using medial approach. The images were marked for Dr. Cintron-Diaz. IMPRESSION: Needle localization left breast. No apparent complications. Electronically Signed   By: Stan  Maynard M.D.   On: 01/01/2017 08:31     Assessment and plan- Patient is a 76 y.o. female pathologicalprognostic stage IA pT1c1p N0 cM0invasive mammary carcinoma of the left breast ER negative PR positive and HER-2 +3 on IHC s/p lumpectomy here for on treatment assessment prior to cycle 2 of weekly taxol and herceptin  Counts ok to proceed with cycle 2 of weekly taxol herecptin today. She will proceed with cycle 3 of taxol/herceptin next week and I will see her back in 2 weeks for cycle 4 of taxol/ herecptin. I will cut down the dose of  benadryl to 25 mg IV  Rectal pain- patient has had diverticulitis before. She does not have any TTP in RLQ. No fever, bleeding or diarrhea. Will watch for now. If symptoms persist over 48 hours, I will consder giving her antibiotics and or getting CT abdomen. We will call her in 2 days to find out how she is doing  rtc in 2 weeks to see md with cbc, cmp   Visit Diagnosis 1. Malignant neoplasm of lower-inner quadrant of left breast in female, estrogen receptor positive (HCC)   2. Encounter for antineoplastic chemotherapy   3. Encounter for monoclonal antibody treatment for malignancy      Dr. Archana Rao, MD, MPH [facility] Pager- [phone] 01/23/2017 8:56 AM                 

## 2017-01-23 ENCOUNTER — Telehealth: Payer: Self-pay | Admitting: *Deleted

## 2017-01-23 ENCOUNTER — Inpatient Hospital Stay: Payer: Medicare HMO

## 2017-01-23 ENCOUNTER — Emergency Department
Admission: EM | Admit: 2017-01-23 | Discharge: 2017-01-23 | Disposition: A | Discharge status: Home / Self Care | Payer: Medicare HMO | Attending: Emergency Medicine | Admitting: Emergency Medicine

## 2017-01-23 ENCOUNTER — Emergency Department: Payer: Medicare HMO

## 2017-01-23 ENCOUNTER — Inpatient Hospital Stay (HOSPITAL_BASED_OUTPATIENT_CLINIC_OR_DEPARTMENT_OTHER): Payer: Medicare HMO | Admitting: Oncology

## 2017-01-23 ENCOUNTER — Encounter: Payer: Self-pay | Admitting: Emergency Medicine

## 2017-01-23 ENCOUNTER — Encounter: Payer: Self-pay | Admitting: Oncology

## 2017-01-23 VITALS — BP 145/79 | HR 65 | Temp 96.9°F | Resp 14 | Wt 126.0 lb

## 2017-01-23 DIAGNOSIS — M545 Low back pain: Secondary | ICD-10-CM | POA: Diagnosis not present

## 2017-01-23 DIAGNOSIS — M810 Age-related osteoporosis without current pathological fracture: Secondary | ICD-10-CM

## 2017-01-23 DIAGNOSIS — K449 Diaphragmatic hernia without obstruction or gangrene: Secondary | ICD-10-CM | POA: Diagnosis not present

## 2017-01-23 DIAGNOSIS — J45909 Unspecified asthma, uncomplicated: Secondary | ICD-10-CM

## 2017-01-23 DIAGNOSIS — R7401 Elevation of levels of liver transaminase levels: Secondary | ICD-10-CM

## 2017-01-23 DIAGNOSIS — Z5112 Encounter for antineoplastic immunotherapy: Secondary | ICD-10-CM

## 2017-01-23 DIAGNOSIS — Z8673 Personal history of transient ischemic attack (TIA), and cerebral infarction without residual deficits: Secondary | ICD-10-CM

## 2017-01-23 DIAGNOSIS — F419 Anxiety disorder, unspecified: Secondary | ICD-10-CM | POA: Diagnosis not present

## 2017-01-23 DIAGNOSIS — R109 Unspecified abdominal pain: Secondary | ICD-10-CM | POA: Diagnosis not present

## 2017-01-23 DIAGNOSIS — K589 Irritable bowel syndrome without diarrhea: Secondary | ICD-10-CM

## 2017-01-23 DIAGNOSIS — C50312 Malignant neoplasm of lower-inner quadrant of left female breast: Secondary | ICD-10-CM

## 2017-01-23 DIAGNOSIS — Z7982 Long term (current) use of aspirin: Secondary | ICD-10-CM

## 2017-01-23 DIAGNOSIS — Z9012 Acquired absence of left breast and nipple: Secondary | ICD-10-CM

## 2017-01-23 DIAGNOSIS — R197 Diarrhea, unspecified: Secondary | ICD-10-CM | POA: Insufficient documentation

## 2017-01-23 DIAGNOSIS — Z5111 Encounter for antineoplastic chemotherapy: Secondary | ICD-10-CM | POA: Diagnosis not present

## 2017-01-23 DIAGNOSIS — Z8719 Personal history of other diseases of the digestive system: Secondary | ICD-10-CM

## 2017-01-23 DIAGNOSIS — I10 Essential (primary) hypertension: Secondary | ICD-10-CM | POA: Insufficient documentation

## 2017-01-23 DIAGNOSIS — K6289 Other specified diseases of anus and rectum: Secondary | ICD-10-CM

## 2017-01-23 DIAGNOSIS — Z79899 Other long term (current) drug therapy: Secondary | ICD-10-CM

## 2017-01-23 DIAGNOSIS — Z170 Estrogen receptor positive status [ER+]: Principal | ICD-10-CM

## 2017-01-23 DIAGNOSIS — K219 Gastro-esophageal reflux disease without esophagitis: Secondary | ICD-10-CM

## 2017-01-23 DIAGNOSIS — R51 Headache: Secondary | ICD-10-CM

## 2017-01-23 DIAGNOSIS — N941 Unspecified dyspareunia: Secondary | ICD-10-CM | POA: Diagnosis not present

## 2017-01-23 DIAGNOSIS — R740 Nonspecific elevation of levels of transaminase and lactic acid dehydrogenase [LDH]: Secondary | ICD-10-CM | POA: Diagnosis not present

## 2017-01-23 DIAGNOSIS — Z87891 Personal history of nicotine dependence: Secondary | ICD-10-CM | POA: Diagnosis not present

## 2017-01-23 DIAGNOSIS — R110 Nausea: Secondary | ICD-10-CM

## 2017-01-23 DIAGNOSIS — I341 Nonrheumatic mitral (valve) prolapse: Secondary | ICD-10-CM

## 2017-01-23 DIAGNOSIS — D649 Anemia, unspecified: Secondary | ICD-10-CM

## 2017-01-23 DIAGNOSIS — N3010 Interstitial cystitis (chronic) without hematuria: Secondary | ICD-10-CM

## 2017-01-23 DIAGNOSIS — E7800 Pure hypercholesterolemia, unspecified: Secondary | ICD-10-CM

## 2017-01-23 DIAGNOSIS — Z171 Estrogen receptor negative status [ER-]: Secondary | ICD-10-CM

## 2017-01-23 DIAGNOSIS — Z800 Family history of malignant neoplasm of digestive organs: Secondary | ICD-10-CM

## 2017-01-23 DIAGNOSIS — R1011 Right upper quadrant pain: Secondary | ICD-10-CM | POA: Diagnosis not present

## 2017-01-23 LAB — URINALYSIS, COMPLETE (UACMP) WITH MICROSCOPIC
Bacteria, UA: NONE SEEN
Bilirubin Urine: NEGATIVE
Glucose, UA: >=500 mg/dL — AB
Hgb urine dipstick: NEGATIVE
Ketones, ur: NEGATIVE mg/dL
Leukocytes, UA: NEGATIVE
Nitrite: NEGATIVE
Protein, ur: NEGATIVE mg/dL
Specific Gravity, Urine: 1.003 — ABNORMAL LOW (ref 1.005–1.030)
Squamous Epithelial / LPF: NONE SEEN
WBC, UA: NONE SEEN WBC/hpf (ref 0–5)
pH: 6 (ref 5.0–8.0)

## 2017-01-23 LAB — COMPREHENSIVE METABOLIC PANEL
ALT: 37 U/L (ref 14–54)
ALT: 171 U/L — ABNORMAL HIGH (ref 14–54)
AST: 34 U/L (ref 15–41)
AST: 249 U/L — ABNORMAL HIGH (ref 15–41)
Albumin: 3.5 g/dL (ref 3.5–5.0)
Albumin: 3.8 g/dL (ref 3.5–5.0)
Alkaline Phosphatase: 64 U/L (ref 38–126)
Alkaline Phosphatase: 108 U/L (ref 38–126)
Anion gap: 7 (ref 5–15)
Anion gap: 8 (ref 5–15)
BUN: 13 mg/dL (ref 6–20)
BUN: 14 mg/dL (ref 6–20)
CO2: 27 mmol/L (ref 22–32)
CO2: 26 mmol/L (ref 22–32)
Calcium: 8.6 mg/dL — ABNORMAL LOW (ref 8.9–10.3)
Calcium: 8.9 mg/dL (ref 8.9–10.3)
Chloride: 97 mmol/L — ABNORMAL LOW (ref 101–111)
Chloride: 98 mmol/L — ABNORMAL LOW (ref 101–111)
Creatinine, Ser: 0.78 mg/dL (ref 0.44–1.00)
Creatinine, Ser: 0.81 mg/dL (ref 0.44–1.00)
GFR calc Af Amer: >60 mL/min (ref >60)
GFR calc Af Amer: >60 mL/min (ref >60)
GFR calc non Af Amer: >60 mL/min (ref >60)
GFR calc non Af Amer: >60 mL/min (ref >60)
Glucose, Bld: 118 mg/dL — ABNORMAL HIGH (ref 65–99)
Glucose, Bld: 257 mg/dL — ABNORMAL HIGH (ref 65–99)
Potassium: 3.7 mmol/L (ref 3.5–5.1)
Potassium: 4.1 mmol/L (ref 3.5–5.1)
Sodium: 131 mmol/L — ABNORMAL LOW (ref 135–145)
Sodium: 132 mmol/L — ABNORMAL LOW (ref 135–145)
Total Bilirubin: 0.6 mg/dL (ref 0.3–1.2)
Total Bilirubin: 0.8 mg/dL (ref 0.3–1.2)
Total Protein: 6.4 g/dL — ABNORMAL LOW (ref 6.5–8.1)
Total Protein: 7.3 g/dL (ref 6.5–8.1)

## 2017-01-23 LAB — CBC WITH DIFFERENTIAL/PLATELET
Basophils Absolute: 0 10*3/uL (ref 0–0.1)
Basophils Relative: 1 %
Eosinophils Absolute: 0.3 10*3/uL (ref 0–0.7)
Eosinophils Relative: 8 %
HCT: 33.3 % — ABNORMAL LOW (ref 35.0–47.0)
Hemoglobin: 11.4 g/dL — ABNORMAL LOW (ref 12.0–16.0)
Lymphocytes Relative: 25 %
Lymphs Abs: 0.9 10*3/uL — ABNORMAL LOW (ref 1.0–3.6)
MCH: 30.3 pg (ref 26.0–34.0)
MCHC: 34.1 g/dL (ref 32.0–36.0)
MCV: 88.7 fL (ref 80.0–100.0)
Monocytes Absolute: 0.2 10*3/uL (ref 0.2–0.9)
Monocytes Relative: 6 %
Neutro Abs: 2.1 10*3/uL (ref 1.4–6.5)
Neutrophils Relative %: 60 %
Platelets: 208 10*3/uL (ref 150–440)
RBC: 3.75 MIL/uL — ABNORMAL LOW (ref 3.80–5.20)
RDW: 12.6 % (ref 11.5–14.5)
WBC: 3.4 10*3/uL — ABNORMAL LOW (ref 3.6–11.0)

## 2017-01-23 LAB — CBC
HCT: 38 % (ref 35.0–47.0)
Hemoglobin: 12.9 g/dL (ref 12.0–16.0)
MCH: 30.1 pg (ref 26.0–34.0)
MCHC: 34 g/dL (ref 32.0–36.0)
MCV: 88.5 fL (ref 80.0–100.0)
Platelets: 230 10*3/uL (ref 150–440)
RBC: 4.29 MIL/uL (ref 3.80–5.20)
RDW: 12.5 % (ref 11.5–14.5)
WBC: 2.6 10*3/uL — ABNORMAL LOW (ref 3.6–11.0)

## 2017-01-23 LAB — LIPASE, BLOOD: Lipase: 35 U/L (ref 11–51)

## 2017-01-23 MED ORDER — HEPARIN SOD (PORK) LOCK FLUSH 100 UNIT/ML IV SOLN
500.0000 [IU] | Freq: Once | INTRAVENOUS | Status: AC
  Administered 2017-01-23: 500 [IU] via INTRAVENOUS

## 2017-01-23 MED ORDER — DEXAMETHASONE SODIUM PHOSPHATE 100 MG/10ML IJ SOLN
20.0000 mg | Freq: Once | INTRAMUSCULAR | Status: AC
  Administered 2017-01-23: 20 mg via INTRAVENOUS
  Filled 2017-01-23: qty 2

## 2017-01-23 MED ORDER — DIPHENHYDRAMINE HCL 50 MG/ML IJ SOLN
25.0000 mg | Freq: Once | INTRAMUSCULAR | Status: AC
  Administered 2017-01-23: 25 mg via INTRAVENOUS
  Filled 2017-01-23: qty 1

## 2017-01-23 MED ORDER — SODIUM CHLORIDE 0.9 % IV BOLUS (SEPSIS)
1000.0000 mL | Freq: Once | INTRAVENOUS | Status: AC
  Administered 2017-01-23: 1000 mL via INTRAVENOUS

## 2017-01-23 MED ORDER — PACLITAXEL CHEMO INJECTION 300 MG/50ML
80.0000 mg/m2 | Freq: Once | INTRAVENOUS | Status: AC
  Administered 2017-01-23: 126 mg via INTRAVENOUS
  Filled 2017-01-23: qty 21

## 2017-01-23 MED ORDER — DICYCLOMINE HCL 20 MG PO TABS: 20.0000 mg | ORAL_TABLET | Freq: Three times a day (TID) | ORAL | 0 refills | Status: DC | PRN

## 2017-01-23 MED ORDER — HEPARIN SOD (PORK) LOCK FLUSH 100 UNIT/ML IV SOLN: 500.0000 [IU] | Freq: Once | INTRAVENOUS | Status: DC | PRN

## 2017-01-23 MED ORDER — IOPAMIDOL (ISOVUE-300) INJECTION 61%
30.0000 mL | Freq: Once | INTRAVENOUS | Status: AC
Start: 2017-01-23 — End: 2017-01-23
  Administered 2017-01-23: 30 mL via ORAL

## 2017-01-23 MED ORDER — IOPAMIDOL (ISOVUE-300) INJECTION 61%
100.0000 mL | Freq: Once | INTRAVENOUS | Status: AC | PRN
  Administered 2017-01-23: 100 mL via INTRAVENOUS

## 2017-01-23 MED ORDER — ACETAMINOPHEN 325 MG PO TABS
650.0000 mg | ORAL_TABLET | Freq: Once | ORAL | Status: AC
  Administered 2017-01-23: 650 mg via ORAL
  Filled 2017-01-23: qty 2

## 2017-01-23 MED ORDER — DICYCLOMINE HCL 10 MG PO CAPS
10.0000 mg | ORAL_CAPSULE | Freq: Once | ORAL | Status: AC
  Administered 2017-01-23: 10 mg via ORAL
  Filled 2017-01-23: qty 1

## 2017-01-23 MED ORDER — SODIUM CHLORIDE 0.9 % IV SOLN
Freq: Once | INTRAVENOUS | Status: AC
Start: 2017-01-23 — End: 2017-01-23
  Administered 2017-01-23: 10:00:00 via INTRAVENOUS
  Filled 2017-01-23: qty 1000

## 2017-01-23 MED ORDER — FAMOTIDINE IN NACL 20-0.9 MG/50ML-% IV SOLN
20.0000 mg | Freq: Once | INTRAVENOUS | Status: AC
  Administered 2017-01-23: 20 mg via INTRAVENOUS
  Filled 2017-01-23: qty 50

## 2017-01-23 MED ORDER — SODIUM CHLORIDE 0.9 % IV SOLN
2.0000 mg/kg | Freq: Once | INTRAVENOUS | Status: AC
  Administered 2017-01-23: 105 mg via INTRAVENOUS
  Filled 2017-01-23: qty 5

## 2017-01-23 MED ORDER — SODIUM CHLORIDE 0.9% FLUSH
10.0000 mL | Freq: Once | INTRAVENOUS | Status: AC
  Administered 2017-01-23: 10 mL via INTRAVENOUS
  Filled 2017-01-23: qty 10

## 2017-01-23 NOTE — ED Triage Notes (Signed)
Pt via pov from home with abdominal pain for the last couple of days. Pt has hx of diverticulitis, and says this feels similar. Pt states pain is in lower left quadrant and lower back. She reports that she has had low grade fever off and on this week. Pt is receiving chemotherapy. Pt alert & oriented with NAD noted.

## 2017-01-23 NOTE — ED Notes (Signed)
CT called to inform them the patient has finished drinking the contrast and is ready for the scan.

## 2017-01-23 NOTE — ED Notes (Signed)

## 2017-01-23 NOTE — ED Provider Notes (Addendum)
[facility] Emergency Department Provider Note  ____________________________________________   First MD Initiated Contact with Patient 01/23/17 1836     (approximate)  I have reviewed the triage vital signs and the nursing notes.   HISTORY  Chief Complaint Abdominal Pain   HPI Doris Barnes is a 76 y.o. female with a history of breast cancer as well as diverticulitis who is presenting to the emergency department today with diffuse abdominal pain.  She says that she has had cramping over the past 2 days.  She recently started chemo last week.  She reports taking Taxol as well as Herceptin.  She says that she had her last treatment yesterday.  She said that she moved her bowels earlier today and had what started out as a hard bowel movement transition into a loose bowel movement.  However, she denies any blood in her stool.  Denies any vomiting but does admit to nausea.  Says that the pain comes in waves and had been relieved with moving her bowels previously.  Reports only minimal pain at this time.  Says that the pain does radiate through to her lower back.  Does not report any dysuria.  Past Medical History:  Diagnosis Date  . Anemia   . Anxiety   . Asthma   . Cancer ([place]) skin  . Chronic vulvitis   . Colon polyp   . Cystocele   . Diverticulitis   . Diverticulitis   . Diverticulosis   . Diverticulosis   . Dyspareunia, female   . Dyspnea   . Dysrhythmia   . Fibrocystic disease of both breasts   . Gastritis   . GERD (gastroesophageal reflux disease)    gastritis also  . Hemorrhoids   . History of hiatal hernia   . Hypercholesteremia   . Hypertension   . IBS (irritable bowel syndrome)   . IC (interstitial cystitis)   . Migraine   . Mitral valve prolapse   . OP (osteoporosis)   . Positional vertigo   . Rectocele   . Squamous cell carcinoma   . Stroke [facility])    TIA  . Vaginal atrophy     Patient Active Problem List   Diagnosis Date Noted   . Goals of care, counseling/discussion 01/09/2017  . Breast cancer ([place]) 12/21/2016  . TIA (transient ischemic attack) 02/22/2016  . Ischemic colitis ([place]) 02/22/2016  . Psoriasiform dermatitis 01/07/2015  . Bloodgood disease 12/31/2014  . HLD (hyperlipidemia) 12/31/2014  . Headache, migraine 12/31/2014  . Billowing mitral valve 12/31/2014  . Osteoporosis, post-menopausal 12/31/2014  . Squamous cell carcinoma 12/31/2014  . Essential hypertension 09/18/2014  . Degeneration of cervical intervertebral disc 09/18/2014  . Ataxia 09/17/2014  . Adaptive colitis 02/02/2014    Past Surgical History:  Procedure Laterality Date  . APPENDECTOMY    . BREAST BIOPSY Left 1998   core bx- neg  . BREAST BIOPSY Left 2007   neg  . BREAST BIOPSY Left 12/14/2016   left breast US core path pending  . BREAST EXCISIONAL BIOPSY Left   . BREAST LUMPECTOMY Left 01/01/2017    INVASIVE MAMMARY CARCINOMA/Grade 3  . CARDIAC CATHETERIZATION N/A 01/18/2015   Procedure: Left Heart Cath and Coronary Angiography;  Surgeon: Teodoro Spray, MD;  Location: [place] [facility];  Service: Cardiovascular;  Laterality: N/A;  . CATARACT EXTRACTION W/PHACO Right 12/15/2015   Procedure: CATARACT EXTRACTION PHACO AND INTRAOCULAR LENS PLACEMENT (IOC);  Surgeon: Estill Cotta, MD;  Location: [facility] ORS;  Service: Ophthalmology;  Laterality: Right;  [place] 01:20 AP% 23.9 CDE 35.49 Fluid pack lot # [id number]  . CATARACT EXTRACTION W/PHACO Left 12/29/2015   Procedure: CATARACT EXTRACTION PHACO AND INTRAOCULAR LENS PLACEMENT ([place]);  Surgeon: Estill Cotta, MD;  Location: [facility] ORS;  Service: Ophthalmology;  Laterality: Left;  [place]  01:20 AP% 25.1 CDE 32.86 Fluid pack lot # [id number]  . COLON SURGERY    . COLONOSCOPY WITH PROPOFOL N/A 02/21/2016   Procedure: COLONOSCOPY WITH PROPOFOL;  Surgeon: Manya Silvas, MD;  Location: [facility] ENDOSCOPY;  Service: Endoscopy;  Laterality: N/A;  . CORONARY ANGIOPLASTY    . DILATION AND  CURETTAGE OF UTERUS    . OOPHORECTOMY Bilateral   . PARTIAL MASTECTOMY WITH NEEDLE LOCALIZATION Left 01/01/2017   Procedure: PARTIAL MASTECTOMY WITH NEEDLE LOCALIZATION;  Surgeon: Herbert Pun, MD;  Location: [facility] ORS;  Service: General;  Laterality: Left;  . PORTACATH PLACEMENT Right 01/01/2017   Procedure: INSERTION PORT-A-CATH;  Surgeon: Herbert Pun, MD;  Location: [facility] ORS;  Service: General;  Laterality: Right;  . SENTINEL NODE BIOPSY Left 01/01/2017   Procedure: SENTINEL NODE BIOPSY;  Surgeon: Herbert Pun, MD;  Location: [facility] ORS;  Service: General;  Laterality: Left;  . TONSILLECTOMY    . VAGINAL HYSTERECTOMY      Prior to Admission medications   Medication Sig Start Date End Date Taking? Authorizing Provider  acetaminophen (TYLENOL) 325 MG tablet Take 650 mg by mouth every 6 (six) hours as needed (for headaches.).     [provider]  ALPRAZolam Duanne Moron) 0.25 MG tablet Take 0.125-0.25 mg by mouth 2 (two) times daily as needed for anxiety or sleep.  11/17/14   [provider]  aspirin EC 325 MG tablet Take 325 mg by mouth daily with breakfast.     [provider]  atenolol (TENORMIN) 50 MG tablet Take 50 mg by mouth daily with breakfast.  07/24/14   [provider]  calcium citrate-vitamin D (CITRACAL+D) 315-200 MG-UNIT tablet Take 1 tablet by mouth 2 (two) times daily.    [provider]  FLUoxetine (PROZAC) 40 MG capsule Take 40 mg by mouth at bedtime. 12/18/16   [provider]  fluticasone (FLONASE) 50 MCG/ACT nasal spray Place 2 sprays into both nostrils daily as needed for allergies.     [provider]  hydrochlorothiazide (HYDRODIURIL) 25 MG tablet Take 25 mg by mouth daily.  07/24/14   [provider]  lansoprazole (PREVACID) 30 MG capsule Take 30 mg by mouth daily before breakfast.  07/24/14   [provider]  lidocaine-prilocaine (EMLA) cream Apply to affected area once  01/04/17   Sindy Guadeloupe, MD  loratadine (CLARITIN) 10 MG tablet Take 10 mg by mouth daily as needed for allergies.  03/19/14   [provider]  Magnesium 250 MG TABS Take 250 mg by mouth daily.    [provider]  Multiple Vitamins-Minerals (PRESERVISION AREDS 2) CAPS Take 1 capsule by mouth 2 (two) times daily.     [provider]  ondansetron (ZOFRAN) 8 MG tablet Take 1 tablet (8 mg total) by mouth 2 (two) times daily as needed (Nausea or vomiting). 01/04/17   Sindy Guadeloupe, MD  prochlorperazine (COMPAZINE) 10 MG tablet Take 1 tablet (10 mg total) by mouth every 6 (six) hours as needed (Nausea or vomiting). 01/04/17   Sindy Guadeloupe, MD  simvastatin (ZOCOR) 20 MG tablet Take 20 mg by mouth daily at 8 pm. 11/04/16   [provider]  telmisartan (MICARDIS) 80 MG  tablet Take 40 mg by mouth daily with breakfast. 11/23/16   [provider]    Allergies Imipramine pamoate; Bisphosphonates; Epinephrine; Prednisone; and Sulfa antibiotics  Family History  Problem Relation Age of Onset  . Diabetes Mother   . Diabetes Father   . Colon cancer Sister   . Diabetes Sister   . Diabetes Maternal Aunt   . Colon cancer Paternal Grandfather   . Lung cancer Brother   . Breast cancer Neg Hx   . Ovarian cancer Neg Hx     Social History Social History  Substance Use Topics  . Smoking status: Former Smoker    Packs/day: 1.00    Years: 33.00    Quit date: 10/17/1987  . Smokeless tobacco: Never Used  . Alcohol use No    Review of Systems  Constitutional: No fever/chills Eyes: No visual changes. ENT: No sore throat. Cardiovascular: Denies chest pain. Respiratory: Denies shortness of breath. Gastrointestinal:  no vomiting. Genitourinary: Negative for dysuria. Musculoskeletal: as above Skin: Negative for rash. Neurological: Negative for headaches, focal weakness or numbness.   ____________________________________________   PHYSICAL EXAM:  VITAL  SIGNS: ED Triage Vitals  Enc Vitals Group     BP 01/23/17 1730 (!) 148/70     Pulse Rate 01/23/17 1730 92     Resp 01/23/17 1730 18     Temp 01/23/17 1730 99.1 F (37.3 C)     Temp Source 01/23/17 1730 Oral     SpO2 01/23/17 1730 96 %     Weight 01/23/17 1731 125 lb (56.7 kg)     Height 01/23/17 1731 5\' 2"  (1.575 m)     Head Circumference --      Peak Flow --      Pain Score 01/23/17 1730 10     Pain Loc --      Pain Edu? --      Excl. in [place]? --     Constitutional: Alert and oriented. Well appearing and in no acute distress. Eyes: Conjunctivae are normal.  Head: Atraumatic. Nose: No congestion/rhinnorhea. Mouth/Throat: Mucous membranes are moist.  Neck: No stridor.   Cardiovascular: Normal rate, regular rhythm. Grossly normal heart sounds.  Respiratory: Normal respiratory effort.  No retractions. Lungs CTAB. Gastrointestinal: Soft with diffuse tenderness but with the greatest tenderness the right upper quadrant where there is a negative Murphy sign.  No rebound or guarding. No distention. No CVA tenderness. Musculoskeletal: No lower extremity tenderness nor edema.  No joint effusions. Neurologic:  Normal speech and language. No gross focal neurologic deficits are appreciated. Skin:  Skin is warm, dry and intact. No rash noted. Psychiatric: Mood and affect are normal. Speech and behavior are normal.  ____________________________________________   LABS (all labs ordered are listed, but only abnormal results are displayed)  Labs Reviewed  COMPREHENSIVE METABOLIC PANEL - Abnormal; Notable for the following:       Result Value   Sodium 132 (*)    Chloride 98 (*)    Glucose, Bld 257 (*)    AST 249 (*)    ALT 171 (*)    All other components within normal limits  CBC - Abnormal; Notable for the following:    WBC 2.6 (*)    All other components within normal limits  URINALYSIS, COMPLETE (UACMP) WITH MICROSCOPIC - Abnormal; Notable for the following:    Color, Urine STRAW  (*)    APPearance CLEAR (*)    Specific Gravity, Urine 1.003 (*)    Glucose, UA >=500 (*)  All other components within normal limits  LIPASE, BLOOD   ____________________________________________  EKG   ____________________________________________  RADIOLOGY  No definitive active inflammatory response seen in the abdomen and pelvis CAT scan. ____________________________________________   PROCEDURES  Procedure(s) performed: None  Procedures  Critical Care performed: No  ____________________________________________   INITIAL IMPRESSION / ASSESSMENT AND PLAN / ED COURSE  Pertinent labs & imaging results that were available during my care of the patient were reviewed by me and considered in my medical decision making (see chart for details).  Differential diagnosis includes, but is not limited to, ovarian cyst, ovarian torsion, acute appendicitis, diverticulitis, urinary tract infection/pyelonephritis, endometriosis, bowel obstruction, colitis, renal colic, gastroenteritis, hernia, pregnancy related pain including ectopic pregnancy, etc. Differential diagnosis includes, but is not limited to, biliary disease (biliary colic, acute cholecystitis, cholangitis, choledocholithiasis, etc), intrathoracic causes for epigastric abdominal pain including ACS, gastritis, duodenitis, pancreatitis, small bowel or large bowel obstruction, abdominal aortic aneurysm, hernia, and gastritis.  As part of my medical decision making, I reviewed the following data within the [place] chart reviewed       ----------------------------------------- 8:49 PM on 01/23/2017 -----------------------------------------  I discussed the case with Dr. Rogue Bussing of the cancer center who says that the Taxol may be causing elevated liver labs.  I also reexamined the patient's abdomen and she has no tenderness to palpation to the left lower quadrant.  Mild to moderate tenderness to  palpation to the other quadrants without any rebound or guarding.  Patient also says that she has had an episode of diarrhea.  Says that she has prescriptions for ondansetron as well as promethazine at home.  I will add Bentyl for spasm and diarrhea medication.  The patient will be following up with the cancer center.  With the oncologist on call is in agreement with this plan.  Likely side effect secondary to chemotherapy causing the constellation of symptoms.  Patient and husband understand the diagnosis as well as the treatment plan.  Patient understands to return immediately for any worsening or concerning symptoms.  ____________________________________________   FINAL CLINICAL IMPRESSION(S) / ED DIAGNOSES  Abdominal pain.  Nausea.  Diarrhea.    NEW MEDICATIONS STARTED DURING THIS VISIT:  New Prescriptions   No medications on file     Note:  This document was prepared using Dragon voice recognition software and may include unintentional dictation errors.     Orbie Pyo, MD 01/23/17 2051    Orbie Pyo, MD 01/23/17 2053

## 2017-01-23 NOTE — Progress Notes (Signed)
Spoke with patient and she will continue to drink extra fluids. I will call her on Thursday to see how she is doing...dhs

## 2017-01-23 NOTE — Telephone Encounter (Signed)
Doris Barnes from triage got a call from pat later in afternoon that she is hurting in her belly and her back and was told by doctor that if it hurts again she should call. I went to call pt and tell her that Dr. Janese Banks wants to order ct of abdomen with contrast. I pulled her chart to make phoen call and she is currently in ER.  I have notified Dr. Janese Banks about her status.

## 2017-01-23 NOTE — Progress Notes (Signed)
Patient here for follow up with labs and treatment with taxol and herceptin. She has been having intermittent headaches since her last treatment and has a slight headache now. She reports having some mild nausea after her treatment which required her to take only one nausea pill. She states that the Benadryl nearly knocked her for a loop, and she was incapacitated for a couple of days after her treatment. She basically slept the rest of the day and the following day and had a headache the entire time. She is also having pain in her lower back which seems to be relieved after a bowel movement. She states that she is extremely tired and is concerned about the cumulative effects of the chemo treatments.

## 2017-01-24 ENCOUNTER — Telehealth: Payer: Self-pay | Admitting: *Deleted

## 2017-01-24 NOTE — Telephone Encounter (Signed)
Patient called to report that she went to ER last evening and was given IV fluids and medicaton for spasms and she is feeling better this morning.

## 2017-01-25 ENCOUNTER — Other Ambulatory Visit: Payer: Self-pay | Admitting: *Deleted

## 2017-01-25 ENCOUNTER — Telehealth: Payer: Self-pay | Admitting: *Deleted

## 2017-01-25 NOTE — Telephone Encounter (Signed)
Spoke to patient and told her to continue PO fluids and the Dicyclomine for the cramping. She will see Dr. Janese Banks on Tuesday; appt made.   dhs

## 2017-01-25 NOTE — Telephone Encounter (Signed)
She can continue to take PO fluids. I will see ehr next week as planned and add fluids if I think she is dehydrated

## 2017-01-25 NOTE — Telephone Encounter (Signed)
Called patient to check on her per Dr. Elroy Channel request from Tuesday, when patient was here for chemotherapy.  She stated that she went to each lunch with a friend today, and on the way home she started to feel like her insides were on fire. She sounded a little panicky, and after a few minutes on the phone with her she calmed down a bit. She stated that she was going to take a xanax and relax for a while. She is still taking the Dicyclomine for cramping, and it is helping. She was worried that she might be getting dehydrated because she pinched up her skin and it did not retract right away. I asked her about her fluid intake, and she stated that she has already had 6 glasses of water today and asked me if it is ok to drink Gatorade. I told her yes, and that if she is drinking that much fluid, she is probably not dehydrated. I told her that I would get a message to Dr. Janese Banks and let her know what is going on and give her a call back later today.   dhs

## 2017-01-25 NOTE — Telephone Encounter (Signed)
Left VM message for patient to call us back and let us know how she is feeling.    dhs

## 2017-01-26 DIAGNOSIS — I341 Nonrheumatic mitral (valve) prolapse: Secondary | ICD-10-CM | POA: Diagnosis not present

## 2017-01-26 DIAGNOSIS — I25119 Atherosclerotic heart disease of native coronary artery with unspecified angina pectoris: Secondary | ICD-10-CM | POA: Diagnosis not present

## 2017-01-26 DIAGNOSIS — I10 Essential (primary) hypertension: Secondary | ICD-10-CM | POA: Diagnosis not present

## 2017-01-26 DIAGNOSIS — E782 Mixed hyperlipidemia: Secondary | ICD-10-CM | POA: Diagnosis not present

## 2017-01-30 ENCOUNTER — Other Ambulatory Visit: Payer: Self-pay | Admitting: *Deleted

## 2017-01-30 ENCOUNTER — Inpatient Hospital Stay (HOSPITAL_BASED_OUTPATIENT_CLINIC_OR_DEPARTMENT_OTHER): Payer: Medicare HMO | Admitting: Oncology

## 2017-01-30 ENCOUNTER — Encounter: Payer: Self-pay | Admitting: Oncology

## 2017-01-30 ENCOUNTER — Inpatient Hospital Stay: Payer: Medicare HMO | Attending: Oncology

## 2017-01-30 ENCOUNTER — Inpatient Hospital Stay: Payer: Medicare HMO

## 2017-01-30 VITALS — BP 131/78 | HR 66 | Temp 96.7°F | Resp 16 | Wt 124.0 lb

## 2017-01-30 DIAGNOSIS — E7800 Pure hypercholesterolemia, unspecified: Secondary | ICD-10-CM | POA: Diagnosis not present

## 2017-01-30 DIAGNOSIS — K429 Umbilical hernia without obstruction or gangrene: Secondary | ICD-10-CM

## 2017-01-30 DIAGNOSIS — K5730 Diverticulosis of large intestine without perforation or abscess without bleeding: Secondary | ICD-10-CM

## 2017-01-30 DIAGNOSIS — I341 Nonrheumatic mitral (valve) prolapse: Secondary | ICD-10-CM

## 2017-01-30 DIAGNOSIS — Z5111 Encounter for antineoplastic chemotherapy: Secondary | ICD-10-CM | POA: Insufficient documentation

## 2017-01-30 DIAGNOSIS — Z87891 Personal history of nicotine dependence: Secondary | ICD-10-CM | POA: Insufficient documentation

## 2017-01-30 DIAGNOSIS — F419 Anxiety disorder, unspecified: Secondary | ICD-10-CM

## 2017-01-30 DIAGNOSIS — D701 Agranulocytosis secondary to cancer chemotherapy: Secondary | ICD-10-CM

## 2017-01-30 DIAGNOSIS — Z8673 Personal history of transient ischemic attack (TIA), and cerebral infarction without residual deficits: Secondary | ICD-10-CM

## 2017-01-30 DIAGNOSIS — Z79899 Other long term (current) drug therapy: Secondary | ICD-10-CM

## 2017-01-30 DIAGNOSIS — M810 Age-related osteoporosis without current pathological fracture: Secondary | ICD-10-CM | POA: Diagnosis not present

## 2017-01-30 DIAGNOSIS — Z171 Estrogen receptor negative status [ER-]: Secondary | ICD-10-CM | POA: Insufficient documentation

## 2017-01-30 DIAGNOSIS — Z800 Family history of malignant neoplasm of digestive organs: Secondary | ICD-10-CM | POA: Insufficient documentation

## 2017-01-30 DIAGNOSIS — I700 Atherosclerosis of aorta: Secondary | ICD-10-CM

## 2017-01-30 DIAGNOSIS — T451X5A Adverse effect of antineoplastic and immunosuppressive drugs, initial encounter: Secondary | ICD-10-CM

## 2017-01-30 DIAGNOSIS — K219 Gastro-esophageal reflux disease without esophagitis: Secondary | ICD-10-CM

## 2017-01-30 DIAGNOSIS — K449 Diaphragmatic hernia without obstruction or gangrene: Secondary | ICD-10-CM | POA: Diagnosis not present

## 2017-01-30 DIAGNOSIS — Z86010 Personal history of colonic polyps: Secondary | ICD-10-CM | POA: Diagnosis not present

## 2017-01-30 DIAGNOSIS — D709 Neutropenia, unspecified: Secondary | ICD-10-CM | POA: Insufficient documentation

## 2017-01-30 DIAGNOSIS — J45909 Unspecified asthma, uncomplicated: Secondary | ICD-10-CM | POA: Diagnosis not present

## 2017-01-30 DIAGNOSIS — E871 Hypo-osmolality and hyponatremia: Secondary | ICD-10-CM | POA: Diagnosis not present

## 2017-01-30 DIAGNOSIS — R918 Other nonspecific abnormal finding of lung field: Secondary | ICD-10-CM | POA: Diagnosis not present

## 2017-01-30 DIAGNOSIS — R21 Rash and other nonspecific skin eruption: Secondary | ICD-10-CM | POA: Diagnosis not present

## 2017-01-30 DIAGNOSIS — R040 Epistaxis: Secondary | ICD-10-CM | POA: Insufficient documentation

## 2017-01-30 DIAGNOSIS — R140 Abdominal distension (gaseous): Secondary | ICD-10-CM | POA: Insufficient documentation

## 2017-01-30 DIAGNOSIS — Z801 Family history of malignant neoplasm of trachea, bronchus and lung: Secondary | ICD-10-CM

## 2017-01-30 DIAGNOSIS — E876 Hypokalemia: Secondary | ICD-10-CM

## 2017-01-30 DIAGNOSIS — C50312 Malignant neoplasm of lower-inner quadrant of left female breast: Secondary | ICD-10-CM

## 2017-01-30 DIAGNOSIS — E878 Other disorders of electrolyte and fluid balance, not elsewhere classified: Secondary | ICD-10-CM

## 2017-01-30 DIAGNOSIS — Z5112 Encounter for antineoplastic immunotherapy: Secondary | ICD-10-CM | POA: Insufficient documentation

## 2017-01-30 DIAGNOSIS — Z170 Estrogen receptor positive status [ER+]: Principal | ICD-10-CM

## 2017-01-30 DIAGNOSIS — I10 Essential (primary) hypertension: Secondary | ICD-10-CM | POA: Insufficient documentation

## 2017-01-30 LAB — CBC WITH DIFFERENTIAL/PLATELET
Basophils Absolute: 0 10*3/uL (ref 0–0.1)
Basophils Relative: 1 %
Eosinophils Absolute: 0.2 10*3/uL (ref 0–0.7)
Eosinophils Relative: 7 %
HCT: 32.6 % — ABNORMAL LOW (ref 35.0–47.0)
Hemoglobin: 11.2 g/dL — ABNORMAL LOW (ref 12.0–16.0)
Lymphocytes Relative: 37 %
Lymphs Abs: 1 10*3/uL (ref 1.0–3.6)
MCH: 30.4 pg (ref 26.0–34.0)
MCHC: 34.4 g/dL (ref 32.0–36.0)
MCV: 88.3 fL (ref 80.0–100.0)
Monocytes Absolute: 0.2 10*3/uL (ref 0.2–0.9)
Monocytes Relative: 7 %
Neutro Abs: 1.3 10*3/uL — ABNORMAL LOW (ref 1.4–6.5)
Neutrophils Relative %: 48 %
Platelets: 245 10*3/uL (ref 150–440)
RBC: 3.69 MIL/uL — ABNORMAL LOW (ref 3.80–5.20)
RDW: 12.8 % (ref 11.5–14.5)
WBC: 2.7 10*3/uL — ABNORMAL LOW (ref 3.6–11.0)

## 2017-01-30 LAB — COMPREHENSIVE METABOLIC PANEL
ALT: 42 U/L (ref 14–54)
AST: 33 U/L (ref 15–41)
Albumin: 3.5 g/dL (ref 3.5–5.0)
Alkaline Phosphatase: 71 U/L (ref 38–126)
Anion gap: 7 (ref 5–15)
BUN: 8 mg/dL (ref 6–20)
CO2: 27 mmol/L (ref 22–32)
Calcium: 8.5 mg/dL — ABNORMAL LOW (ref 8.9–10.3)
Chloride: 96 mmol/L — ABNORMAL LOW (ref 101–111)
Creatinine, Ser: 0.62 mg/dL (ref 0.44–1.00)
GFR calc Af Amer: >60 mL/min (ref >60)
GFR calc non Af Amer: >60 mL/min (ref >60)
Glucose, Bld: 141 mg/dL — ABNORMAL HIGH (ref 65–99)
Potassium: 3.4 mmol/L — ABNORMAL LOW (ref 3.5–5.1)
Sodium: 130 mmol/L — ABNORMAL LOW (ref 135–145)
Total Bilirubin: 0.5 mg/dL (ref 0.3–1.2)
Total Protein: 6.5 g/dL (ref 6.5–8.1)

## 2017-01-30 MED ORDER — FAMOTIDINE IN NACL 20-0.9 MG/50ML-% IV SOLN
20.0000 mg | Freq: Once | INTRAVENOUS | Status: AC
  Administered 2017-01-30: 20 mg via INTRAVENOUS
  Filled 2017-01-30: qty 50

## 2017-01-30 MED ORDER — SODIUM CHLORIDE 0.9 % IV SOLN
20.0000 mg | Freq: Once | INTRAVENOUS | Status: AC
  Administered 2017-01-30: 20 mg via INTRAVENOUS
  Filled 2017-01-30: qty 2

## 2017-01-30 MED ORDER — SODIUM CHLORIDE 0.9 % IV SOLN
Freq: Once | INTRAVENOUS | Status: AC
  Administered 2017-01-30: 10:00:00 via INTRAVENOUS
  Filled 2017-01-30: qty 1000

## 2017-01-30 MED ORDER — SIMETHICONE 80 MG PO CHEW: 80.0000 mg | CHEWABLE_TABLET | Freq: Four times a day (QID) | ORAL | 0 refills | Status: DC | PRN

## 2017-01-30 MED ORDER — SODIUM CHLORIDE 0.9% FLUSH
10.0000 mL | INTRAVENOUS | Status: DC | PRN
  Administered 2017-01-30: 10 mL
  Filled 2017-01-30: qty 10

## 2017-01-30 MED ORDER — PACLITAXEL CHEMO INJECTION 300 MG/50ML
80.0000 mg/m2 | Freq: Once | INTRAVENOUS | Status: AC
  Administered 2017-01-30: 126 mg via INTRAVENOUS
  Filled 2017-01-30: qty 21

## 2017-01-30 MED ORDER — TRASTUZUMAB CHEMO 150 MG IV SOLR
2.0000 mg/kg | Freq: Once | INTRAVENOUS | Status: AC
  Administered 2017-01-30: 105 mg via INTRAVENOUS
  Filled 2017-01-30: qty 5

## 2017-01-30 MED ORDER — SODIUM CHLORIDE 0.9 % IV SOLN
Freq: Once | INTRAVENOUS | Status: AC
  Administered 2017-01-30: 12:00:00 via INTRAVENOUS
  Filled 2017-01-30: qty 1000

## 2017-01-30 MED ORDER — DIPHENHYDRAMINE HCL 50 MG/ML IJ SOLN
25.0000 mg | Freq: Once | INTRAMUSCULAR | Status: AC
  Administered 2017-01-30: 25 mg via INTRAVENOUS
  Filled 2017-01-30: qty 1

## 2017-01-30 MED ORDER — HEPARIN SOD (PORK) LOCK FLUSH 100 UNIT/ML IV SOLN
500.0000 [IU] | Freq: Once | INTRAVENOUS | Status: AC | PRN
  Administered 2017-01-30: 500 [IU]
  Filled 2017-01-30: qty 5

## 2017-01-30 MED ORDER — ACETAMINOPHEN 325 MG PO TABS
650.0000 mg | ORAL_TABLET | Freq: Once | ORAL | Status: AC
  Administered 2017-01-30: 650 mg via ORAL
  Filled 2017-01-30: qty 2

## 2017-01-30 NOTE — Addendum Note (Signed)
Addended by: Randa Evens C on: 01/30/2017 10:14 AM   Modules accepted: Orders

## 2017-01-30 NOTE — Progress Notes (Signed)
Patient here for follow up with labs and treatment today. She continues to have stomach cramps and has to take the Dycyclomine daily. She states that after she ate a bowl of cereal yesterday, her stomach became upset and she went to the bathroom about 20-25 times. She denies having any pain right now. She is convinced that she has irritable bowel syndrome.

## 2017-01-30 NOTE — Progress Notes (Signed)
ANC: 1300. MD, Dr. Janese Banks, already aware. Per MD order: proceed with scheduled treatment today.

## 2017-01-30 NOTE — Progress Notes (Signed)
Hematology/Oncology Consult note [facility]  Telephone:([phone] [phone]  Patient Care Team: Rusty Aus, MD as PCP - General (Internal Medicine)   Name of the patient: Doris Barnes  [id number]  [birth year]   Date of visit: 01/30/17  Diagnosis- Stage IA invasive mammary carcinoma of the left breast ER negative, PR weakly positive and HER-2 positive  Chief complaint/ Reason for visit- on treatment assessment prior to cycle 3 of taxol herceptin  Heme/Onc history:1. Patient is a 76 year old female who underwent bilateral screening mammogram on 11/28/2016 which showed a possible mass and calcifications in her left breast. This was followed by diagnostic mammogram and an ultrasound of the left breast which showed: 1.4 x 1.2 x 1.3 cm irregular mass in the left breast at the 6:30 position 3 cm from the nipple. On magnification views the overall extent of the mass and calcifications measures 1.9 cm. Left axilla was evaluated with ultrasound and showed no enlarged and morphologically abnormal lymph nodes.   2. She underwent core biopsy of the left breast mass which showed: Invasive mammary carcinoma, grade 3. No DCIS or lymphovascular invasion. ER was negative and PR was 11-50% positive and HER-2 was +3 on IHC  3[name] who plans for elective lumpectomy with sentinel lymph node biopsy pending oncology opinion.  4. Menarche at the age of 42 years. G5 P5 L5. She did have twoprior biopsiesof her left breast which did not reveal any malignancy. No family history of breast or ovarian cancer. Her sister and maternal grandfather had colon cancer. She is considerably anxious today at the thought of getting chemotherapy. She does have a history of mitral valve prolapse and states that she has "irregular heartbeat"for which she is on atenolol. She had a Holter monitor in the past but was not found to have any  arrhythmia.Baseline MUGA scan showed a normal EF of 64.5%  5. Patient underwent lumpectomy on 01/01/2017 which showed invasive mammary carcinoma, size of tumor was 14 mm, grade 3 with negative margins. 0 out of one lymph nodes was positive for malignancy. There was associated DCIS. Lymphovascular invasion was negative. ER negative, PR 11-50% positive and HER-2/neu +3   Interval history- Patient went to ER with worsening abdominal/ rectal pain after cycle 2 of chemotherapy. She had CT abdomen which did not show any acute pathology like diverticulitis. She was discharged from the ER on bentyl which has helped her with her abdominal pain. Abdominal pain is btetter. No diarrhea or constipation. No blood in stools other than occasional hemorrhoids. No fever. She reports gas and bloating  ECOG PS- 0 Pain scale- 0   Review of systems- Review of Systems  Constitutional: Negative for chills, fever, malaise/fatigue and weight loss.  HENT: Negative for congestion, ear discharge and nosebleeds.   Eyes: Negative for blurred vision.  Respiratory: Negative for cough, hemoptysis, sputum production, shortness of breath and wheezing.   Cardiovascular: Negative for chest pain, palpitations, orthopnea and claudication.  Gastrointestinal: Negative for abdominal pain, blood in stool, constipation, diarrhea, heartburn, melena, nausea and vomiting.  Genitourinary: Negative for dysuria, flank pain, frequency, hematuria and urgency.  Musculoskeletal: Negative for back pain, joint pain and myalgias.  Skin: Negative for rash.  Neurological: Negative for dizziness, tingling, focal weakness, seizures, weakness and headaches.  Endo/Heme/Allergies: Does not bruise/bleed easily.  Psychiatric/Behavioral: Negative for depression and suicidal ideas. The patient does not have insomnia.       Allergies  Allergen Reactions  .  Imipramine Pamoate Shortness Of Breath  . Bisphosphonates Nausea Only  . Epinephrine Other (See  Comments)    Difficulty breathing  . Prednisone Palpitations  . Sulfa Antibiotics Rash    Face turns red and stings     Past Medical History:  Diagnosis Date  . Anemia   . Anxiety   . Asthma   . Cancer ([place]) skin  . Chronic vulvitis   . Colon polyp   . Cystocele   . Diverticulitis   . Diverticulitis   . Diverticulosis   . Diverticulosis   . Dyspareunia, female   . Dyspnea   . Dysrhythmia   . Fibrocystic disease of both breasts   . Gastritis   . GERD (gastroesophageal reflux disease)    gastritis also  . Hemorrhoids   . History of hiatal hernia   . Hypercholesteremia   . Hypertension   . IBS (irritable bowel syndrome)   . IC (interstitial cystitis)   . Migraine   . Mitral valve prolapse   . OP (osteoporosis)   . Positional vertigo   . Rectocele   . Squamous cell carcinoma   . Stroke [facility])    TIA  . Vaginal atrophy      Past Surgical History:  Procedure Laterality Date  . APPENDECTOMY    . BREAST BIOPSY Left 1998   core bx- neg  . BREAST BIOPSY Left 2007   neg  . BREAST BIOPSY Left 12/14/2016   left breast US core path pending  . BREAST EXCISIONAL BIOPSY Left   . BREAST LUMPECTOMY Left 01/01/2017    INVASIVE MAMMARY CARCINOMA/Grade 3  . COLON SURGERY    . CORONARY ANGIOPLASTY    . DILATION AND CURETTAGE OF UTERUS    . OOPHORECTOMY Bilateral   . TONSILLECTOMY    . VAGINAL HYSTERECTOMY      Social History   Socioeconomic History  . Marital status: Married    Spouse name: Not on file  . Number of children: Not on file  . Years of education: Not on file  . Highest education level: Not on file  Social Needs  . Financial resource strain: Not on file  . Food insecurity - worry: Not on file  . Food insecurity - inability: Not on file  . Transportation needs - medical: Not on file  . Transportation needs - non-medical: Not on file  Occupational History  . Not on file  Tobacco Use  . Smoking status: Former Smoker    Packs/day: 1.00    Years:  33.00    Pack years: 33.00    Last attempt to quit: 10/17/1987    Years since quitting: 29.3  . Smokeless tobacco: Never Used  Substance and Sexual Activity  . Alcohol use: No  . Drug use: No  . Sexual activity: Yes  Other Topics Concern  . Not on file  Social History Narrative  . Not on file    Family History  Problem Relation Age of Onset  . Diabetes Mother   . Diabetes Father   . Colon cancer Sister   . Diabetes Sister   . Diabetes Maternal Aunt   . Colon cancer Paternal Grandfather   . Lung cancer Brother   . Breast cancer Neg Hx   . Ovarian cancer Neg Hx      Current Outpatient Medications:  .  acetaminophen (TYLENOL) 325 MG tablet, Take 650 mg by mouth every 6 (six) hours as needed (for headaches.). , Disp: , Rfl:  .  ALPRAZolam (XANAX) 0.25 MG tablet, Take 0.125-0.25 mg by mouth 2 (two) times daily as needed for anxiety or sleep. , Disp: , Rfl:  .  aspirin EC 325 MG tablet, Take 325 mg by mouth daily with breakfast. , Disp: , Rfl:  .  atenolol (TENORMIN) 50 MG tablet, Take 50 mg by mouth daily with breakfast. , Disp: , Rfl:  .  calcium citrate-vitamin D (CITRACAL+D) 315-200 MG-UNIT tablet, Take 1 tablet by mouth 2 (two) times daily., Disp: , Rfl:  .  dicyclomine (BENTYL) 20 MG tablet, Take 1 tablet (20 mg total) by mouth 3 (three) times daily as needed for spasms., Disp: 30 tablet, Rfl: 0 .  FLUoxetine (PROZAC) 40 MG capsule, Take 40 mg by mouth at bedtime., Disp: , Rfl:  .  fluticasone (FLONASE) 50 MCG/ACT nasal spray, Place 2 sprays into both nostrils daily as needed for allergies. , Disp: , Rfl:  .  hydrochlorothiazide (HYDRODIURIL) 25 MG tablet, Take 25 mg by mouth daily. , Disp: , Rfl:  .  lansoprazole (PREVACID) 30 MG capsule, Take 30 mg by mouth daily before breakfast. , Disp: , Rfl:  .  lidocaine-prilocaine (EMLA) cream, Apply to affected area once, Disp: 30 g, Rfl: 3 .  loratadine (CLARITIN) 10 MG tablet, Take 10 mg by mouth daily as needed for allergies. ,  Disp: , Rfl:  .  Magnesium 250 MG TABS, Take 250 mg by mouth daily., Disp: , Rfl:  .  Multiple Vitamins-Minerals (PRESERVISION AREDS 2) CAPS, Take 1 capsule by mouth 2 (two) times daily. , Disp: , Rfl:  .  ondansetron (ZOFRAN) 8 MG tablet, Take 1 tablet (8 mg total) by mouth 2 (two) times daily as needed (Nausea or vomiting)., Disp: 30 tablet, Rfl: 1 .  prochlorperazine (COMPAZINE) 10 MG tablet, Take 1 tablet (10 mg total) by mouth every 6 (six) hours as needed (Nausea or vomiting)., Disp: 30 tablet, Rfl: 1 .  simvastatin (ZOCOR) 20 MG tablet, Take 20 mg by mouth daily at 8 pm., Disp: , Rfl:  .  telmisartan (MICARDIS) 80 MG tablet, Take 40 mg by mouth daily with breakfast., Disp: , Rfl:   Physical exam:  Vitals:   01/30/17 0850 01/30/17 0854  BP:  131/78  Pulse:  66  Resp:  16  Temp:  (!) 96.7 F (35.9 C)  TempSrc:  Tympanic  Weight: 124 lb (56.2 kg) 124 lb (56.2 kg)   Physical Exam  Constitutional: She is oriented to person, place, and time and well-developed, well-nourished, and in no distress.  HENT:  Head: Normocephalic and atraumatic.  Eyes: EOM are normal. Pupils are equal, round, and reactive to light.  Neck: Normal range of motion.  Cardiovascular: Normal rate, regular rhythm and normal heart sounds.  Pulmonary/Chest: Effort normal and breath sounds normal.  Abdominal: Soft. Bowel sounds are normal. She exhibits no distension. There is no tenderness.  Neurological: She is alert and oriented to person, place, and time.  Skin: Skin is warm and dry.     CMP Latest Ref Rng & Units 01/30/2017  Glucose 65 - 99 mg/dL 141(H)  BUN 6 - 20 mg/dL 8  Creatinine 0.44 - 1.00 mg/dL 0.62  Sodium 135 - 145 mmol/L 130(L)  Potassium 3.5 - 5.1 mmol/L 3.4(L)  Chloride 101 - 111 mmol/L 96(L)  CO2 22 - 32 mmol/L 27  Calcium 8.9 - 10.3 mg/dL 8.5(L)  Total Protein 6.5 - 8.1 g/dL 6.5  Total Bilirubin 0.3 - 1.2 mg/dL 0.5  Alkaline Phos 38 -  126 U/L 71  AST 15 - 41 U/L 33  ALT 14 - 54 U/L 42     CBC Latest Ref Rng & Units 01/30/2017  WBC 3.6 - 11.0 K/uL 2.7(L)  Hemoglobin 12.0 - 16.0 g/dL 11.2(L)  Hematocrit 35.0 - 47.0 % 32.6(L)  Platelets 150 - 440 K/uL 245    No images are attached to the encounter.  Ct Abdomen Pelvis W Contrast  Result Date: 01/23/2017 CLINICAL DATA:  76 year old female with abdominal pain. EXAM: CT ABDOMEN AND PELVIS WITH CONTRAST TECHNIQUE: Multidetector CT imaging of the abdomen and pelvis was performed using the standard protocol following bolus administration of intravenous contrast. CONTRAST:  153m ISOVUE-300 IOPAMIDOL (ISOVUE-300) INJECTION 61% COMPARISON:  Abdominal CT dated 11/23/2011 FINDINGS: Lower chest: Stop a 5 mm right lower lobe pulmonary nodule similar to 2013. A 5 mm left lung base nodule also appears stable. The visualized lung bases are clear. No intra-abdominal free air or free fluid. Hepatobiliary: The liver is unremarkable. No intrahepatic biliary ductal dilatation. The gallbladder is predominantly contracted. Pancreas: Unremarkable. No pancreatic ductal dilatation or surrounding inflammatory changes. Spleen: Normal in size without focal abnormality. Adrenals/Urinary Tract: The adrenal glands are unremarkable. The kidneys, visualized ureters, and urinary bladder appear unremarkable. Stomach/Bowel: There is sigmoid diverticulosis with muscular hypertrophy. Segmental thickened appearance of the sigmoid colon, likely chronic. Mild acute diverticulitis is less likely. Clinical correlation is recommended. No perisigmoid stranding noted. There is no evidence of bowel obstruction or active inflammation. Appendectomy. Vascular/Lymphatic: There is advanced aortoiliac atherosclerotic disease. The IVC is grossly unremarkable. No portal venous gas. There is no adenopathy. Reproductive: Hysterectomy.  No pelvic mass. Other: Small fat containing umbilical hernia. Musculoskeletal: Osteopenia with degenerative changes of the spine. No acute osseous pathology.  IMPRESSION: 1. Sigmoid diverticulosis with muscular hypertrophy. No definite active inflammation. Segmental thickened appearance of the sigmoid colon likely chronic changes. Correlation with clinical exam is recommended. No bowel obstruction. 2.  Aortic Atherosclerosis (ICD10-I70.0). 3. Stable lung base nodules. Electronically Signed   By: AAnner CreteM.D.   On: 01/23/2017 20:37   Nm Sentinel Node Injection  Result Date: 01/01/2017 CLINICAL DATA:  Left breast cancer. EXAM: NUCLEAR MEDICINE BREAST LYMPHOSCINTIGRAPHY left breast TECHNIQUE: Intradermal injection of radiopharmaceutical was performed at the 12 o'clock, 3 o'clock, 6 o'clock, and 9 o'clock positions around the left nipple. The patient was then sent to the operating room where the sentinel node(s) were identified and removed by the surgeon. RADIOPHARMACEUTICALS:  Total of 1 mCi Millipore-filtered Technetium-[id number] colloid, injected in four aliquots of 0.25 mCi each. IMPRESSION: Uncomplicated intradermal injection of a total of 1 mCi Technetium-[id number] colloid for purposes of sentinel node identification. Electronically Signed   By: David  JorMartiniqueD.   On: 01/01/2017 09:49   Mm Breast Surgical Specimen  Result Date: 01/01/2017 CLINICAL DATA:  Status post left breast surgical excision today. EXAM: SPECIMEN RADIOGRAPH OF THE LEFT BREAST COMPARISON:  Previous exam(s). FINDINGS: Status post excision of the left breast. The wire tip and biopsy marker clip are present within the specimen. Findings discussed with the OR staff during the procedure. IMPRESSION: Specimen radiograph of the left breast. Electronically Signed   By: StaFranki CabotD.   On: 01/01/2017 12:55   Dg Chest Port 1 View  Result Date: 01/01/2017 CLINICAL DATA:  Status post port placement. EXAM: PORTABLE CHEST 1 VIEW COMPARISON:  12/18/2015 FINDINGS: Right jugular Port-A-Cath has been placed. This is a CT injectable port. Catheter tip in the lower SVC region. Coarse lung  markings are chronic. Negative for a pneumothorax. Heart size is normal. The trachea is midline. IMPRESSION: Placement of right jugular Port-A-Cath. Catheter tip in the lower SVC. Negative for a pneumothorax. Electronically Signed   By: Markus Daft M.D.   On: 01/01/2017 14:22   Dg C-arm 1-60 Min-no Report  Result Date: 01/01/2017 Fluoroscopy was utilized by the requesting physician.  No radiographic interpretation.   Mm Lt Plc Breast Loc Dev   1st Lesion  Inc Mammo Guide  Result Date: 01/01/2017 CLINICAL DATA:  Patient with left breast cancer scheduled for breast conservation surgery later today requiring preoperative needle localization. EXAM: NEEDLE LOCALIZATION OF THE LEFT BREAST WITH MAMMO GUIDANCE COMPARISON:  Previous exams. FINDINGS: Patient presents for needle localization prior to breast conservation surgery. I met with the patient and we discussed the procedure of needle localization including benefits and alternatives. We discussed the high likelihood of a successful procedure. We discussed the risks of the procedure, including infection, bleeding, tissue injury, and further surgery. Informed, written consent was given. The usual time-out protocol was performed immediately prior to the procedure. Using mammographic guidance, sterile technique, 1% lidocaine and a 9 cm modified Kopans needle, the coil shaped clip within the lower inner quadrant of the left breast was localized using medial approach. The images were marked for Dr. Windell Moment. IMPRESSION: Needle localization left breast. No apparent complications. Electronically Signed   By: Franki Cabot M.D.   On: 01/01/2017 08:31     Assessment and plan- Patient is a 76 y.o. female pathologicalprognostic stage IA pT1c1p N0 cM0invasive mammary carcinoma of the left breast ER negative PR positive and HER-2 +3 on IHC s/p lumpectomy here for on treatment assessment prior to cycle 3 of weekly taxol and herceptin  Wbc 2.7 today. anc 1.3.  Counts  ok to proceed with cycle 3 of weekly taxol herecptin today. She will get 2 days of neupogen starting tomorrow and we will repeat cbc on 02/01/17. She will proceed with cycle 4 of taxol/herceptin next week and I will see her back in 2 weeks for cycle 5 of taxol/ herecptin. Cbc, cmp each week  Abnormal LFT's- resolved.  Hyponatremia- she has hyponatremia/ hypochloremia and low potasium. Will give 1 L NS and 40 meq of IV potassium today. Repeat labs next week  Gas/bloating- will try simethicone and see if it helps   Visit Diagnosis 1. Malignant neoplasm of lower-inner quadrant of left breast in female, estrogen receptor positive ([place])   2. Encounter for antineoplastic chemotherapy   3. Encounter for monoclonal antibody treatment for malignancy      Dr. Randa Evens, MD, MPH [facility] Pager- [phone] 01/30/2017 9:17 AM

## 2017-01-31 ENCOUNTER — Inpatient Hospital Stay: Payer: Medicare HMO

## 2017-01-31 DIAGNOSIS — R140 Abdominal distension (gaseous): Secondary | ICD-10-CM | POA: Diagnosis not present

## 2017-01-31 DIAGNOSIS — R918 Other nonspecific abnormal finding of lung field: Secondary | ICD-10-CM | POA: Diagnosis not present

## 2017-01-31 DIAGNOSIS — R040 Epistaxis: Secondary | ICD-10-CM | POA: Diagnosis not present

## 2017-01-31 DIAGNOSIS — Z5111 Encounter for antineoplastic chemotherapy: Secondary | ICD-10-CM | POA: Diagnosis not present

## 2017-01-31 DIAGNOSIS — C50312 Malignant neoplasm of lower-inner quadrant of left female breast: Secondary | ICD-10-CM | POA: Diagnosis not present

## 2017-01-31 DIAGNOSIS — R21 Rash and other nonspecific skin eruption: Secondary | ICD-10-CM | POA: Diagnosis not present

## 2017-01-31 DIAGNOSIS — Z5112 Encounter for antineoplastic immunotherapy: Secondary | ICD-10-CM | POA: Diagnosis not present

## 2017-01-31 DIAGNOSIS — E878 Other disorders of electrolyte and fluid balance, not elsewhere classified: Secondary | ICD-10-CM | POA: Diagnosis not present

## 2017-01-31 DIAGNOSIS — T451X5A Adverse effect of antineoplastic and immunosuppressive drugs, initial encounter: Principal | ICD-10-CM

## 2017-01-31 DIAGNOSIS — Z171 Estrogen receptor negative status [ER-]: Secondary | ICD-10-CM | POA: Diagnosis not present

## 2017-01-31 DIAGNOSIS — D701 Agranulocytosis secondary to cancer chemotherapy: Secondary | ICD-10-CM

## 2017-01-31 MED ORDER — TBO-FILGRASTIM 300 MCG/0.5ML ~~LOC~~ SOSY
300.0000 ug | PREFILLED_SYRINGE | Freq: Every day | SUBCUTANEOUS | Status: AC
  Administered 2017-01-31: 300 ug via SUBCUTANEOUS

## 2017-02-01 ENCOUNTER — Other Ambulatory Visit: Payer: Self-pay | Admitting: *Deleted

## 2017-02-01 ENCOUNTER — Inpatient Hospital Stay: Payer: Medicare HMO

## 2017-02-01 DIAGNOSIS — R140 Abdominal distension (gaseous): Secondary | ICD-10-CM | POA: Diagnosis not present

## 2017-02-01 DIAGNOSIS — R21 Rash and other nonspecific skin eruption: Secondary | ICD-10-CM | POA: Diagnosis not present

## 2017-02-01 DIAGNOSIS — Z5111 Encounter for antineoplastic chemotherapy: Secondary | ICD-10-CM | POA: Diagnosis not present

## 2017-02-01 DIAGNOSIS — Z170 Estrogen receptor positive status [ER+]: Principal | ICD-10-CM

## 2017-02-01 DIAGNOSIS — R040 Epistaxis: Secondary | ICD-10-CM | POA: Diagnosis not present

## 2017-02-01 DIAGNOSIS — C50312 Malignant neoplasm of lower-inner quadrant of left female breast: Secondary | ICD-10-CM | POA: Diagnosis not present

## 2017-02-01 DIAGNOSIS — R918 Other nonspecific abnormal finding of lung field: Secondary | ICD-10-CM | POA: Diagnosis not present

## 2017-02-01 DIAGNOSIS — Z171 Estrogen receptor negative status [ER-]: Secondary | ICD-10-CM | POA: Diagnosis not present

## 2017-02-01 DIAGNOSIS — E878 Other disorders of electrolyte and fluid balance, not elsewhere classified: Secondary | ICD-10-CM | POA: Diagnosis not present

## 2017-02-01 DIAGNOSIS — Z5112 Encounter for antineoplastic immunotherapy: Secondary | ICD-10-CM | POA: Diagnosis not present

## 2017-02-01 LAB — CBC WITH DIFFERENTIAL/PLATELET
Basophils Absolute: 0 10*3/uL (ref 0–0.1)
Basophils Relative: 0 %
Eosinophils Absolute: 0 10*3/uL (ref 0–0.7)
Eosinophils Relative: 1 %
HCT: 32.5 % — ABNORMAL LOW (ref 35.0–47.0)
Hemoglobin: 11.1 g/dL — ABNORMAL LOW (ref 12.0–16.0)
Lymphocytes Relative: 17 %
Lymphs Abs: 1.2 10*3/uL (ref 1.0–3.6)
MCH: 30.5 pg (ref 26.0–34.0)
MCHC: 34.1 g/dL (ref 32.0–36.0)
MCV: 89.4 fL (ref 80.0–100.0)
Monocytes Absolute: 0.2 10*3/uL (ref 0.2–0.9)
Monocytes Relative: 3 %
Neutro Abs: 5.7 10*3/uL (ref 1.4–6.5)
Neutrophils Relative %: 79 %
Platelets: 218 10*3/uL (ref 150–440)
RBC: 3.63 MIL/uL — ABNORMAL LOW (ref 3.80–5.20)
RDW: 12.9 % (ref 11.5–14.5)
WBC: 7.1 10*3/uL (ref 3.6–11.0)

## 2017-02-02 ENCOUNTER — Encounter: Payer: Self-pay | Admitting: Gastroenterology

## 2017-02-02 ENCOUNTER — Other Ambulatory Visit
Admission: RE | Admit: 2017-02-02 | Discharge: 2017-02-02 | Disposition: A | Discharge status: Home / Self Care | Payer: Medicare HMO | Source: Ambulatory Visit | Attending: Gastroenterology | Admitting: Gastroenterology

## 2017-02-02 ENCOUNTER — Telehealth: Payer: Self-pay

## 2017-02-02 ENCOUNTER — Ambulatory Visit: Payer: Medicare HMO | Admitting: Gastroenterology

## 2017-02-02 VITALS — BP 150/71 | HR 87 | Temp 98.1°F | Ht 62.0 in | Wt 125.2 lb

## 2017-02-02 DIAGNOSIS — R198 Other specified symptoms and signs involving the digestive system and abdomen: Secondary | ICD-10-CM | POA: Diagnosis not present

## 2017-02-02 DIAGNOSIS — R1031 Right lower quadrant pain: Secondary | ICD-10-CM

## 2017-02-02 DIAGNOSIS — E876 Hypokalemia: Secondary | ICD-10-CM

## 2017-02-02 DIAGNOSIS — C50312 Malignant neoplasm of lower-inner quadrant of left female breast: Secondary | ICD-10-CM | POA: Insufficient documentation

## 2017-02-02 DIAGNOSIS — Z170 Estrogen receptor positive status [ER+]: Secondary | ICD-10-CM | POA: Insufficient documentation

## 2017-02-02 DIAGNOSIS — R1032 Left lower quadrant pain: Secondary | ICD-10-CM

## 2017-02-02 LAB — COMPREHENSIVE METABOLIC PANEL
ALT: 57 U/L — ABNORMAL HIGH (ref 14–54)
AST: 25 U/L (ref 15–41)
Albumin: 3.8 g/dL (ref 3.5–5.0)
Alkaline Phosphatase: 85 U/L (ref 38–126)
Anion gap: 11 (ref 5–15)
BUN: 10 mg/dL (ref 6–20)
CO2: 29 mmol/L (ref 22–32)
Calcium: 9.2 mg/dL (ref 8.9–10.3)
Chloride: 96 mmol/L — ABNORMAL LOW (ref 101–111)
Creatinine, Ser: 0.69 mg/dL (ref 0.44–1.00)
GFR calc Af Amer: >60 mL/min (ref >60)
GFR calc non Af Amer: >60 mL/min (ref >60)
Glucose, Bld: 126 mg/dL — ABNORMAL HIGH (ref 65–99)
Potassium: 3.9 mmol/L (ref 3.5–5.1)
Sodium: 136 mmol/L (ref 135–145)
Total Bilirubin: 0.7 mg/dL (ref 0.3–1.2)
Total Protein: 6.7 g/dL (ref 6.5–8.1)

## 2017-02-02 LAB — CBC WITH DIFFERENTIAL/PLATELET
Basophils Absolute: 0 10*3/uL (ref 0–0.1)
Basophils Relative: 0 %
Eosinophils Absolute: 0.1 10*3/uL (ref 0–0.7)
Eosinophils Relative: 2 %
HCT: 35.6 % (ref 35.0–47.0)
Hemoglobin: 11.9 g/dL — ABNORMAL LOW (ref 12.0–16.0)
Lymphocytes Relative: 18 %
Lymphs Abs: 1.2 10*3/uL (ref 1.0–3.6)
MCH: 29.9 pg (ref 26.0–34.0)
MCHC: 33.3 g/dL (ref 32.0–36.0)
MCV: 89.7 fL (ref 80.0–100.0)
Monocytes Absolute: 0.1 10*3/uL — ABNORMAL LOW (ref 0.2–0.9)
Monocytes Relative: 2 %
Neutro Abs: 5.4 10*3/uL (ref 1.4–6.5)
Neutrophils Relative %: 78 %
Platelets: 261 10*3/uL (ref 150–440)
RBC: 3.97 MIL/uL (ref 3.80–5.20)
RDW: 13.5 % (ref 11.5–14.5)
WBC: 6.9 10*3/uL (ref 3.6–11.0)

## 2017-02-02 NOTE — Progress Notes (Signed)
Doris Antigua, MD [address], [place], [place], [place], [id number], [place], [place], [place], [address] Phone: [phone]  Fax: [phone]  Consultation  Referring Provider:     Rusty Aus, MD Primary Care Physician:  Rusty Aus, MD Primary Gastroenterologist:  Virgel Manifold, MD        Reason for Consultation:    Abdominal pain  Date of Consultation:  02/02/2017         HPI:   Doris Barnes is a 76 y.o. female presents for evaluation of abdominal pain (bilateral lower quadrant, cramping, 10/10, the last 2-3 weeks).  Patient reports history of irritable bowel syndrome years ago and states was taking Prozac for it in the past and then was symptom-free for 2-3 years.  She recently was diagnosed with breast cancer and was started on chemotherapy and since starting chemotherapy she states that her previous symptoms have reoccurred.  She went to the ER on October 23 for this and had a CT scan that did not reveal any etiology of her abdominal pain.  She has no anemia on lab work at the time.  She states her symptoms are similar to her symptoms she had 2 or 3 years ago.  She reports multiple bowel movements a day but states that when she sits on the toilet she has to wait 2 or 3 minutes at times before she has a bowel movement.  Her bowel movements seem to be small in quantity when she goes, sometimes loose followed by hard stool and then she has to go again subsequently 4-6 times in that given day.  She does not have any fever or chills.  No stool testing was done during her ER visit.  She has history of anxiety and is on Xanax.  CT reported some sigmoid thickening, she has a history of diverticulosis, last colonoscopy was in November 2017 with some polyps removed and diverticulosis was found.  Past Medical History:  Diagnosis Date  . Anemia   . Anxiety   . Asthma   . Cancer ([place]) skin  . Chronic vulvitis   . Colon polyp   . Cystocele   .  Diverticulitis   . Diverticulitis   . Diverticulosis   . Diverticulosis   . Dyspareunia, female   . Dyspnea   . Dysrhythmia   . Fibrocystic disease of both breasts   . Gastritis   . GERD (gastroesophageal reflux disease)    gastritis also  . Hemorrhoids   . History of hiatal hernia   . Hypercholesteremia   . Hypertension   . IBS (irritable bowel syndrome)   . IC (interstitial cystitis)   . Migraine   . Mitral valve prolapse   . OP (osteoporosis)   . Positional vertigo   . Rectocele   . Squamous cell carcinoma   . Stroke [facility])    TIA  . Vaginal atrophy     Past Surgical History:  Procedure Laterality Date  . APPENDECTOMY    . BREAST BIOPSY Left 1998   core bx- neg  . BREAST BIOPSY Left 2007   neg  . BREAST BIOPSY Left 12/14/2016   left breast US core path pending  . BREAST EXCISIONAL BIOPSY Left   . BREAST LUMPECTOMY Left 01/01/2017    INVASIVE MAMMARY CARCINOMA/Grade 3  . COLON SURGERY    . CORONARY ANGIOPLASTY    . DILATION AND CURETTAGE OF UTERUS    . OOPHORECTOMY Bilateral   . TONSILLECTOMY    .  VAGINAL HYSTERECTOMY      Prior to Admission medications   Medication Sig Start Date End Date Taking? Authorizing Provider  acetaminophen (TYLENOL) 325 MG tablet Take 650 mg by mouth every 6 (six) hours as needed (for headaches.).    Yes [provider]  ALPRAZolam (XANAX) 0.25 MG tablet Take 0.125-0.25 mg by mouth 2 (two) times daily as needed for anxiety or sleep.  11/17/14  Yes [provider]  aspirin EC 325 MG tablet Take 325 mg by mouth daily with breakfast.    Yes [provider]  atenolol (TENORMIN) 50 MG tablet Take 50 mg by mouth daily with breakfast.  07/24/14  Yes [provider]  calcium citrate-vitamin D (CITRACAL+D) 315-200 MG-UNIT tablet Take 1 tablet by mouth 2 (two) times daily.   Yes [provider]  dicyclomine (BENTYL) 20 MG tablet Take 1 tablet (20 mg total) by mouth 3 (three) times daily as needed for  spasms. 01/23/17 01/23/18 Yes Orbie Pyo, MD  FLUoxetine (PROZAC) 40 MG capsule Take 40 mg by mouth at bedtime. 12/18/16  Yes [provider]  fluticasone (FLONASE) 50 MCG/ACT nasal spray Place 2 sprays into both nostrils daily as needed for allergies.    Yes [provider]  hydrochlorothiazide (HYDRODIURIL) 25 MG tablet Take 25 mg by mouth daily.  07/24/14  Yes [provider]  lansoprazole (PREVACID) 30 MG capsule Take 30 mg by mouth daily before breakfast.  07/24/14  Yes [provider]  lidocaine-prilocaine (EMLA) cream Apply to affected area once 01/04/17  Yes Sindy Guadeloupe, MD  loratadine (CLARITIN) 10 MG tablet Take 10 mg by mouth daily as needed for allergies.  03/19/14  Yes [provider]  Magnesium 250 MG TABS Take 250 mg by mouth daily.   Yes [provider]  Multiple Vitamins-Minerals (PRESERVISION AREDS 2) CAPS Take 1 capsule by mouth 2 (two) times daily.    Yes [provider]  ondansetron (ZOFRAN) 8 MG tablet Take 1 tablet (8 mg total) by mouth 2 (two) times daily as needed (Nausea or vomiting). 01/04/17  Yes Sindy Guadeloupe, MD  prochlorperazine (COMPAZINE) 10 MG tablet Take 1 tablet (10 mg total) by mouth every 6 (six) hours as needed (Nausea or vomiting). 01/04/17  Yes Sindy Guadeloupe, MD  simethicone (GAS-X) 80 MG chewable tablet Chew 1 tablet (80 mg total) every 6 (six) hours as needed by mouth for flatulence. 01/30/17  Yes Sindy Guadeloupe, MD  simvastatin (ZOCOR) 20 MG tablet Take 20 mg by mouth daily at 8 pm. 11/04/16  Yes [provider]  telmisartan (MICARDIS) 80 MG tablet Take 40 mg by mouth daily with breakfast. 11/23/16  Yes [provider]    Family History  Problem Relation Age of Onset  . Diabetes Mother   . Diabetes Father   . Colon cancer Sister   . Diabetes Sister   . Diabetes Maternal Aunt   . Colon cancer Paternal Grandfather   . Lung cancer Brother   . Breast cancer  Neg Hx   . Ovarian cancer Neg Hx      Social History   Tobacco Use  . Smoking status: Former Smoker    Packs/day: 1.00    Years: 33.00    Pack years: 33.00    Last attempt to quit: 10/17/1987    Years since quitting: 29.3  . Smokeless tobacco: Never Used  Substance Use Topics  . Alcohol use: No  . Drug use: No  Allergies as of 02/02/2017 - Review Complete 02/02/2017  Allergen Reaction Noted  . Imipramine pamoate Shortness Of Breath 12/13/2015  . Bisphosphonates Nausea Only 11/28/2016  . Epinephrine Other (See Comments) 12/13/2015  . Prednisone Palpitations 12/21/2016  . Sulfa antibiotics Rash 09/17/2014    Review of Systems:    All systems reviewed and negative except where noted in HPI.   Physical Exam:  Vital signs in last 24 hours: @VSRANGES @   General:   Pleasant, cooperative in NAD Head:  Normocephalic and atraumatic. Eyes:   No icterus.   Conjunctiva pink. PERRLA. Ears:  Normal auditory acuity. Neck:  Supple; no masses or thyroidomegaly Lungs: Respirations even and unlabored. Lungs clear to auscultation bilaterally.   No wheezes, crackles, or rhonchi.  Heart:  Regular rate and rhythm;  Without murmur, clicks, rubs or gallops Abdomen:  Soft, nondistended, nontender. Normal bowel sounds. No appreciable masses or hepatomegaly.  No rebound or guarding.  Neurologic:  Alert and oriented x3;  grossly normal neurologically. Skin:  Intact without significant lesions or rashes. Cervical Nodes:  No significant cervical adenopathy. Psych:  Alert and cooperative. Normal affect.  LAB RESULTS: Recent Labs    02/01/17 1030  WBC 7.1  HGB 11.1*  HCT 32.5*  PLT 218   BMET No results for input(s): NA, K, CL, CO2, GLUCOSE, BUN, CREATININE, CALCIUM in the last 72 hours. LFT No results for input(s): PROT, ALBUMIN, AST, ALT, ALKPHOS, BILITOT, BILIDIR, IBILI in the last 72 hours. PT/INR No results for input(s): LABPROT, INR in the last 72 hours. Labs from October 2018  reviewed  STUDIES: No results found. I reviewed the CT and discussion with the radiologist to discuss  thickening that was reported.  Radiologist states that there is sigmoid muscular hypertrophy in the area of the diverticulosis and that the muscular hypertrophy is in relation to the diverticulosis itself.  There do not appear to be any signs of ischemia.   Impression / Plan:   SAMAH LAPIANA is a 76 y.o. y/o female with history of irritable bowel syndrome that improved in the past with Prozac now with recent diagnosis of breast cancer and complaints of stomach cramps and bilateral lower quadrant improves after a bowel movement since starting chemotherapy  Although patient is worried about her stomach cramping symptoms that started 2-3 weeks ago, she appears quite comfortable during my interview and while walking into and out of our office today.  She is not bent over in pain or appears in any acute distress.  She has no blood with her bowel movements.  She has hemorrhoids, and intermittently notices small streaks of blood with her stool chronically which has not changed.  Her abdominal pain or stomach cramps are not related to meals.  She does not have any clinical evidence of ischemic colitis.  My review of the CT scan with the radiologist does not show any evidence of such either.  As per the radiologist the vessels appear patent.  There is moderate atherosclerosis is noted but no signs of ischemia.  As noted above under studies the sigmoid thickening reported on the CT scan is due to muscular hypertrophy in the area of the sigmoid diverticulosis.   I will obtain a stool for C. difficile given her recent hospital visits, stool culture, repeat CMP since she had mild hypokalemia on November 6.  There are no alarm symptoms present. I suspect that her stomach cramping is due to her chronic IBS/constipation with symptom exacerbation with the recent diagnosis of cancer  and chemotherapy.  Improvement  of her stomach cramps post bowel movements is also consistent with the same.  I have asked her to start taking MiraLAX daily with goal of 1-2 soft times a day to allow her to evacuate completely instead of having multiple bowel movements a day.  I have extensively discussed any alarm symptoms to alert placement or to go to the ER with including blood in her stool, worsening abdominal pain, fever chills or any other lesions of concern.  I have encouraged her to maintain good p.o. nutrition as well.  She verbalized understanding.   Thank you for involving me in the care of this patient.    Virgel Manifold, MD  02/02/2017, 12:26 PM

## 2017-02-02 NOTE — Telephone Encounter (Signed)
[facility] has been contacted to go to [facility] lab to have labs completed.  Dr. Bonna Gains has entered the lab orders.  Thanks [employer]

## 2017-02-03 ENCOUNTER — Other Ambulatory Visit
Admission: RE | Admit: 2017-02-03 | Discharge: 2017-02-03 | Disposition: A | Discharge status: Home / Self Care | Payer: Medicare HMO | Source: Other Acute Inpatient Hospital | Attending: Gastroenterology | Admitting: Gastroenterology

## 2017-02-03 DIAGNOSIS — R198 Other specified symptoms and signs involving the digestive system and abdomen: Secondary | ICD-10-CM | POA: Diagnosis not present

## 2017-02-03 LAB — C DIFFICILE QUICK SCREEN W PCR REFLEX
C Diff antigen: NEGATIVE
C Diff interpretation: NOT DETECTED
C Diff toxin: NEGATIVE

## 2017-02-05 ENCOUNTER — Encounter: Payer: Self-pay | Admitting: Gastroenterology

## 2017-02-05 ENCOUNTER — Telehealth: Payer: Self-pay

## 2017-02-05 LAB — STOOL CULTURE

## 2017-02-05 NOTE — Telephone Encounter (Signed)
Advised patient of results per Dr. Bonna Gains   - C. Diff negative. Please inform patient of results.  Her electrolytes are also much improved since her ER visit as well. Pt. Can follow up with PCP and they can also recheck her CMP in a few weeks to ensure mild elevation in liver enzymes have improved

## 2017-02-05 NOTE — Progress Notes (Signed)
Hematology/Oncology Consult note [facility]  Telephone:([phone] [phone]  Patient Care Team: Rusty Aus, MD as PCP - General (Internal Medicine)   Name of the patient: Doris Barnes  [id number]  1941/01/07   Date of visit: 02/05/17  Diagnosis- Stage IA invasive mammary carcinoma of the left breast ER negative, PR weakly positive and HER-2 positive  Chief complaint/ Reason for visit- on treatment assessment prior to cycle4of taxol herceptin  Heme/Onc history:1. Patient is a 76 year old female who underwent bilateral screening mammogram on 11/28/2016 which showed a possible mass and calcifications in her left breast. This was followed by diagnostic mammogram and an ultrasound of the left breast which showed: 1.4 x 1.2 x 1.3 cm irregular mass in the left breast at the 6:30 position 3 cm from the nipple. On magnification views the overall extent of the mass and calcifications measures 1.9 cm. Left axilla was evaluated with ultrasound and showed no enlarged and morphologically abnormal lymph nodes.   2. She underwent core biopsy of the left breast mass which showed: Invasive mammary carcinoma, grade 3. No DCIS or lymphovascular invasion. ER was negative and PR was 11-50% positive and HER-2 was +3 on IHC  3[name] who plans for elective lumpectomy with sentinel lymph node biopsy pending oncology opinion.  4. Menarche at the age of 7 years. G5 P5 L5. She did have twoprior biopsiesof her left breast which did not reveal any malignancy. No family history of breast or ovarian cancer. Her sister and maternal grandfather had colon cancer. She is considerably anxious today at the thought of getting chemotherapy. She does have a history of mitral valve prolapse and states that she has "irregular heartbeat"for which she is on atenolol. She had a Holter monitor in the past but was not found to have any  arrhythmia.Baseline MUGA scan showed a normal EF of 64.5%  5. Patient underwent lumpectomy on 01/01/2017 which showed invasive mammary carcinoma, size of tumor was 14 mm, grade 3 with negative margins. 0 out of one lymph nodes was positive for malignancy. There was associated DCIS. Lymphovascular invasion was negative. ER negative, PR 11-50% positive and HER-2/neu +3   Interval history- Patient continued to have symptoms of abdominal pain and cramping last week and was sent to GI. Symptoms were thought to be due to chronic IBS. C diff was negative. Continues to have good days and bad days with her bowels. Feels weak and on and off abdominal cramping. Also reports that at times her heart beat feels irregular  ECOG PS- 1 Pain scale- 0   Review of systems- Review of Systems  Constitutional: Negative for chills, fever, malaise/fatigue and weight loss.  HENT: Negative for congestion, ear discharge and nosebleeds.   Eyes: Negative for blurred vision.  Respiratory: Negative for cough, hemoptysis, sputum production, shortness of breath and wheezing.   Cardiovascular: Negative for chest pain, palpitations, orthopnea and claudication.  Gastrointestinal: Negative for abdominal pain, blood in stool, constipation, diarrhea, heartburn, melena, nausea and vomiting.  Genitourinary: Negative for dysuria, flank pain, frequency, hematuria and urgency.  Musculoskeletal: Negative for back pain, joint pain and myalgias.  Skin: Negative for rash.  Neurological: Negative for dizziness, tingling, focal weakness, seizures, weakness and headaches.  Endo/Heme/Allergies: Does not bruise/bleed easily.  Psychiatric/Behavioral: Negative for depression and suicidal ideas. The patient does not have insomnia.       Allergies  Allergen Reactions  . Imipramine Pamoate Shortness Of Breath  . Bisphosphonates  Nausea Only  . Epinephrine Other (See Comments)    Difficulty breathing  . Prednisone Palpitations  . Sulfa  Antibiotics Rash    Face turns red and stings     Past Medical History:  Diagnosis Date  . Anemia   . Anxiety   . Asthma   . Cancer ([place]) skin  . Chronic vulvitis   . Colon polyp   . Cystocele   . Diverticulitis   . Diverticulitis   . Diverticulosis   . Diverticulosis   . Dyspareunia, female   . Dyspnea   . Dysrhythmia   . Fibrocystic disease of both breasts   . Gastritis   . GERD (gastroesophageal reflux disease)    gastritis also  . Hemorrhoids   . History of hiatal hernia   . Hypercholesteremia   . Hypertension   . IBS (irritable bowel syndrome)   . IC (interstitial cystitis)   . Migraine   . Mitral valve prolapse   . OP (osteoporosis)   . Positional vertigo   . Rectocele   . Squamous cell carcinoma   . Stroke [facility])    TIA  . Vaginal atrophy      Past Surgical History:  Procedure Laterality Date  . APPENDECTOMY    . BREAST BIOPSY Left 1998   core bx- neg  . BREAST BIOPSY Left 2007   neg  . BREAST BIOPSY Left 12/14/2016   left breast US core path pending  . BREAST EXCISIONAL BIOPSY Left   . BREAST LUMPECTOMY Left 01/01/2017    INVASIVE MAMMARY CARCINOMA/Grade 3  . COLON SURGERY    . CORONARY ANGIOPLASTY    . DILATION AND CURETTAGE OF UTERUS    . OOPHORECTOMY Bilateral   . TONSILLECTOMY    . VAGINAL HYSTERECTOMY      Social History   Socioeconomic History  . Marital status: Married    Spouse name: Not on file  . Number of children: Not on file  . Years of education: Not on file  . Highest education level: Not on file  Social Needs  . Financial resource strain: Not on file  . Food insecurity - worry: Not on file  . Food insecurity - inability: Not on file  . Transportation needs - medical: Not on file  . Transportation needs - non-medical: Not on file  Occupational History  . Not on file  Tobacco Use  . Smoking status: Former Smoker    Packs/day: 1.00    Years: 33.00    Pack years: 33.00    Last attempt to quit: 10/17/1987    Years  since quitting: 29.3  . Smokeless tobacco: Never Used  Substance and Sexual Activity  . Alcohol use: No  . Drug use: No  . Sexual activity: Yes  Other Topics Concern  . Not on file  Social History Narrative  . Not on file    Family History  Problem Relation Age of Onset  . Diabetes Mother   . Diabetes Father   . Colon cancer Sister   . Diabetes Sister   . Diabetes Maternal Aunt   . Colon cancer Paternal Grandfather   . Lung cancer Brother   . Breast cancer Neg Hx   . Ovarian cancer Neg Hx      Current Outpatient Medications:  .  acetaminophen (TYLENOL) 325 MG tablet, Take 650 mg by mouth every 6 (six) hours as needed (for headaches.). , Disp: , Rfl:  .  ALPRAZolam (XANAX) 0.25 MG tablet, Take 0.125-0.25 mg  by mouth 2 (two) times daily as needed for anxiety or sleep. , Disp: , Rfl:  .  aspirin EC 325 MG tablet, Take 325 mg by mouth daily with breakfast. , Disp: , Rfl:  .  atenolol (TENORMIN) 50 MG tablet, Take 50 mg by mouth daily with breakfast. , Disp: , Rfl:  .  calcium citrate-vitamin D (CITRACAL+D) 315-200 MG-UNIT tablet, Take 1 tablet by mouth 2 (two) times daily., Disp: , Rfl:  .  dicyclomine (BENTYL) 20 MG tablet, Take 1 tablet (20 mg total) by mouth 3 (three) times daily as needed for spasms., Disp: 30 tablet, Rfl: 0 .  FLUoxetine (PROZAC) 40 MG capsule, Take 40 mg by mouth at bedtime., Disp: , Rfl:  .  fluticasone (FLONASE) 50 MCG/ACT nasal spray, Place 2 sprays into both nostrils daily as needed for allergies. , Disp: , Rfl:  .  hydrochlorothiazide (HYDRODIURIL) 25 MG tablet, Take 25 mg by mouth daily. , Disp: , Rfl:  .  lansoprazole (PREVACID) 30 MG capsule, Take 30 mg by mouth daily before breakfast. , Disp: , Rfl:  .  lidocaine-prilocaine (EMLA) cream, Apply to affected area once, Disp: 30 g, Rfl: 3 .  loratadine (CLARITIN) 10 MG tablet, Take 10 mg by mouth daily as needed for allergies. , Disp: , Rfl:  .  Magnesium 250 MG TABS, Take 250 mg by mouth daily., Disp: ,  Rfl:  .  Multiple Vitamins-Minerals (PRESERVISION AREDS 2) CAPS, Take 1 capsule by mouth 2 (two) times daily. , Disp: , Rfl:  .  ondansetron (ZOFRAN) 8 MG tablet, Take 1 tablet (8 mg total) by mouth 2 (two) times daily as needed (Nausea or vomiting)., Disp: 30 tablet, Rfl: 1 .  prochlorperazine (COMPAZINE) 10 MG tablet, Take 1 tablet (10 mg total) by mouth every 6 (six) hours as needed (Nausea or vomiting)., Disp: 30 tablet, Rfl: 1 .  simethicone (GAS-X) 80 MG chewable tablet, Chew 1 tablet (80 mg total) every 6 (six) hours as needed by mouth for flatulence., Disp: 30 tablet, Rfl: 0 .  simvastatin (ZOCOR) 20 MG tablet, Take 20 mg by mouth daily at 8 pm., Disp: , Rfl:  .  telmisartan (MICARDIS) 80 MG tablet, Take 40 mg by mouth daily with breakfast., Disp: , Rfl:   Physical exam:  Vitals:   02/06/17 0847  BP: 132/79  Pulse: 81  Resp: 18  Temp: (!) 96.2 F (35.7 C)  TempSrc: Tympanic  Weight: 127 lb (57.6 kg)   Physical Exam  Constitutional: She is oriented to person, place, and time and well-developed, well-nourished, and in no distress.  HENT:  Head: Normocephalic and atraumatic.  Eyes: EOM are normal. Pupils are equal, round, and reactive to light.  Neck: Normal range of motion.  Cardiovascular: Normal rate, regular rhythm and normal heart sounds.  Pulmonary/Chest: Effort normal and breath sounds normal.  Abdominal: Soft. Bowel sounds are normal.  Neurological: She is alert and oriented to person, place, and time.  Skin: Skin is warm and dry.     CMP Latest Ref Rng & Units 02/06/2017  Glucose 65 - 99 mg/dL 166(H)  BUN 6 - 20 mg/dL 10  Creatinine 0.44 - 1.00 mg/dL 0.66  Sodium 135 - 145 mmol/L 134(L)  Potassium 3.5 - 5.1 mmol/L 3.7  Chloride 101 - 111 mmol/L 99(L)  CO2 22 - 32 mmol/L 29  Calcium 8.9 - 10.3 mg/dL 8.7(L)  Total Protein 6.5 - 8.1 g/dL 6.3(L)  Total Bilirubin 0.3 - 1.2 mg/dL 0.4  Alkaline Phos  38 - 126 U/L 84  AST 15 - 41 U/L 33  ALT 14 - 54 U/L 34   CBC  Latest Ref Rng & Units 02/06/2017  WBC 3.6 - 11.0 K/uL 3.9  Hemoglobin 12.0 - 16.0 g/dL 11.2(L)  Hematocrit 35.0 - 47.0 % 32.5(L)  Platelets 150 - 440 K/uL 281    No images are attached to the encounter.  Ct Abdomen Pelvis W Contrast  Result Date: 01/23/2017 CLINICAL DATA:  76 year old female with abdominal pain. EXAM: CT ABDOMEN AND PELVIS WITH CONTRAST TECHNIQUE: Multidetector CT imaging of the abdomen and pelvis was performed using the standard protocol following bolus administration of intravenous contrast. CONTRAST:  153m ISOVUE-300 IOPAMIDOL (ISOVUE-300) INJECTION 61% COMPARISON:  Abdominal CT dated 11/23/2011 FINDINGS: Lower chest: Stop a 5 mm right lower lobe pulmonary nodule similar to 2013. A 5 mm left lung base nodule also appears stable. The visualized lung bases are clear. No intra-abdominal free air or free fluid. Hepatobiliary: The liver is unremarkable. No intrahepatic biliary ductal dilatation. The gallbladder is predominantly contracted. Pancreas: Unremarkable. No pancreatic ductal dilatation or surrounding inflammatory changes. Spleen: Normal in size without focal abnormality. Adrenals/Urinary Tract: The adrenal glands are unremarkable. The kidneys, visualized ureters, and urinary bladder appear unremarkable. Stomach/Bowel: There is sigmoid diverticulosis with muscular hypertrophy. Segmental thickened appearance of the sigmoid colon, likely chronic. Mild acute diverticulitis is less likely. Clinical correlation is recommended. No perisigmoid stranding noted. There is no evidence of bowel obstruction or active inflammation. Appendectomy. Vascular/Lymphatic: There is advanced aortoiliac atherosclerotic disease. The IVC is grossly unremarkable. No portal venous gas. There is no adenopathy. Reproductive: Hysterectomy.  No pelvic mass. Other: Small fat containing umbilical hernia. Musculoskeletal: Osteopenia with degenerative changes of the spine. No acute osseous pathology. IMPRESSION: 1.  Sigmoid diverticulosis with muscular hypertrophy. No definite active inflammation. Segmental thickened appearance of the sigmoid colon likely chronic changes. Correlation with clinical exam is recommended. No bowel obstruction. 2.  Aortic Atherosclerosis (ICD10-I70.0). 3. Stable lung base nodules. Electronically Signed   By: AAnner CreteM.D.   On: 01/23/2017 20:37     Assessment and plan- Patient is a 76y.o. female pathologicalprognostic stage IA pT1c1p N0 [id number] carcinoma of the left breast ER negative PR positive and HER-2 +3 on IHC s/p lumpectomy here for on treatment assessment prior to cycle4 of weekly taxol and herceptin  Counts ok to proceed with cycle 4 of weekly taxol herceptin today. She will proceed with cycle 5 of treatment next week and I will see her back in 2 weeks for cycle 6 of chemotherapy. Cbc/ cmp and mg each week  Stomach cramping- appreciate GI input. Stool studies negative. She will continue bentyl and miralax. If her symptoms worsen, she is hesitant to continue treatment in which case I will plan to only continue herceptin if tolerated  Feeling of irregular heart rate- Hr is regular on todays exam. Will check mg today. Aim to keep K >4, mg >2. If symptoms persist will refer to Dr. FUbaldo Glassing  Visit Diagnosis 1. Malignant neoplasm of lower-inner quadrant of left breast in female, estrogen receptor positive ([place]   2. Encounter for antineoplastic chemotherapy   3. Encounter for monoclonal antibody treatment for malignancy      Dr. ARanda Evens MD, MPH [facility]at AP[facility] Care LLCPager- [id number]/13/2018 9:11 AM

## 2017-02-05 NOTE — Telephone Encounter (Signed)
-----   Message from Virgel Manifold, MD sent at 02/05/2017 11:56 AM EST ----- C. Diff negative. Please inform patient of results.  Her electrolytes are also much improved since her ER visit as well. Pt. Can follow up with PCP and they can also recheck her CMP in a few weeks to ensure mild elevation in liver enzymes have improved

## 2017-02-06 ENCOUNTER — Inpatient Hospital Stay: Payer: Medicare HMO

## 2017-02-06 ENCOUNTER — Inpatient Hospital Stay (HOSPITAL_BASED_OUTPATIENT_CLINIC_OR_DEPARTMENT_OTHER): Payer: Medicare HMO | Admitting: Oncology

## 2017-02-06 ENCOUNTER — Encounter: Payer: Self-pay | Admitting: Oncology

## 2017-02-06 VITALS — BP 132/79 | HR 81 | Temp 96.2°F | Resp 18 | Wt 127.0 lb

## 2017-02-06 DIAGNOSIS — R040 Epistaxis: Secondary | ICD-10-CM | POA: Diagnosis not present

## 2017-02-06 DIAGNOSIS — C50312 Malignant neoplasm of lower-inner quadrant of left female breast: Secondary | ICD-10-CM

## 2017-02-06 DIAGNOSIS — Z5111 Encounter for antineoplastic chemotherapy: Secondary | ICD-10-CM | POA: Diagnosis not present

## 2017-02-06 DIAGNOSIS — R140 Abdominal distension (gaseous): Secondary | ICD-10-CM

## 2017-02-06 DIAGNOSIS — J45909 Unspecified asthma, uncomplicated: Secondary | ICD-10-CM | POA: Diagnosis not present

## 2017-02-06 DIAGNOSIS — Z86010 Personal history of colonic polyps: Secondary | ICD-10-CM

## 2017-02-06 DIAGNOSIS — Z801 Family history of malignant neoplasm of trachea, bronchus and lung: Secondary | ICD-10-CM

## 2017-02-06 DIAGNOSIS — M810 Age-related osteoporosis without current pathological fracture: Secondary | ICD-10-CM

## 2017-02-06 DIAGNOSIS — Z800 Family history of malignant neoplasm of digestive organs: Secondary | ICD-10-CM

## 2017-02-06 DIAGNOSIS — K219 Gastro-esophageal reflux disease without esophagitis: Secondary | ICD-10-CM

## 2017-02-06 DIAGNOSIS — Z170 Estrogen receptor positive status [ER+]: Principal | ICD-10-CM

## 2017-02-06 DIAGNOSIS — I341 Nonrheumatic mitral (valve) prolapse: Secondary | ICD-10-CM

## 2017-02-06 DIAGNOSIS — F419 Anxiety disorder, unspecified: Secondary | ICD-10-CM

## 2017-02-06 DIAGNOSIS — I10 Essential (primary) hypertension: Secondary | ICD-10-CM

## 2017-02-06 DIAGNOSIS — Z5112 Encounter for antineoplastic immunotherapy: Secondary | ICD-10-CM

## 2017-02-06 DIAGNOSIS — E871 Hypo-osmolality and hyponatremia: Secondary | ICD-10-CM

## 2017-02-06 DIAGNOSIS — Z171 Estrogen receptor negative status [ER-]: Secondary | ICD-10-CM | POA: Diagnosis not present

## 2017-02-06 DIAGNOSIS — R918 Other nonspecific abnormal finding of lung field: Secondary | ICD-10-CM | POA: Diagnosis not present

## 2017-02-06 DIAGNOSIS — Z8673 Personal history of transient ischemic attack (TIA), and cerebral infarction without residual deficits: Secondary | ICD-10-CM

## 2017-02-06 DIAGNOSIS — K449 Diaphragmatic hernia without obstruction or gangrene: Secondary | ICD-10-CM | POA: Diagnosis not present

## 2017-02-06 DIAGNOSIS — I700 Atherosclerosis of aorta: Secondary | ICD-10-CM

## 2017-02-06 DIAGNOSIS — R21 Rash and other nonspecific skin eruption: Secondary | ICD-10-CM | POA: Diagnosis not present

## 2017-02-06 DIAGNOSIS — E878 Other disorders of electrolyte and fluid balance, not elsewhere classified: Secondary | ICD-10-CM

## 2017-02-06 DIAGNOSIS — E7800 Pure hypercholesterolemia, unspecified: Secondary | ICD-10-CM

## 2017-02-06 DIAGNOSIS — Z87891 Personal history of nicotine dependence: Secondary | ICD-10-CM

## 2017-02-06 DIAGNOSIS — K5730 Diverticulosis of large intestine without perforation or abscess without bleeding: Secondary | ICD-10-CM

## 2017-02-06 DIAGNOSIS — Z79899 Other long term (current) drug therapy: Secondary | ICD-10-CM

## 2017-02-06 DIAGNOSIS — K429 Umbilical hernia without obstruction or gangrene: Secondary | ICD-10-CM

## 2017-02-06 LAB — CBC WITH DIFFERENTIAL/PLATELET
Basophils Absolute: 0 10*3/uL (ref 0–0.1)
Basophils Relative: 1 %
Eosinophils Absolute: 0.1 10*3/uL (ref 0–0.7)
Eosinophils Relative: 3 %
HCT: 32.5 % — ABNORMAL LOW (ref 35.0–47.0)
Hemoglobin: 11.2 g/dL — ABNORMAL LOW (ref 12.0–16.0)
Lymphocytes Relative: 32 %
Lymphs Abs: 1.2 10*3/uL (ref 1.0–3.6)
MCH: 30.5 pg (ref 26.0–34.0)
MCHC: 34.3 g/dL (ref 32.0–36.0)
MCV: 88.9 fL (ref 80.0–100.0)
Monocytes Absolute: 0.5 10*3/uL (ref 0.2–0.9)
Monocytes Relative: 12 %
Neutro Abs: 2 10*3/uL (ref 1.4–6.5)
Neutrophils Relative %: 52 %
Platelets: 281 10*3/uL (ref 150–440)
RBC: 3.66 MIL/uL — ABNORMAL LOW (ref 3.80–5.20)
RDW: 13.5 % (ref 11.5–14.5)
WBC: 3.9 10*3/uL (ref 3.6–11.0)

## 2017-02-06 LAB — COMPREHENSIVE METABOLIC PANEL
ALT: 34 U/L (ref 14–54)
AST: 33 U/L (ref 15–41)
Albumin: 3.5 g/dL (ref 3.5–5.0)
Alkaline Phosphatase: 84 U/L (ref 38–126)
Anion gap: 6 (ref 5–15)
BUN: 10 mg/dL (ref 6–20)
CO2: 29 mmol/L (ref 22–32)
Calcium: 8.7 mg/dL — ABNORMAL LOW (ref 8.9–10.3)
Chloride: 99 mmol/L — ABNORMAL LOW (ref 101–111)
Creatinine, Ser: 0.66 mg/dL (ref 0.44–1.00)
GFR calc Af Amer: >60 mL/min (ref >60)
GFR calc non Af Amer: >60 mL/min (ref >60)
Glucose, Bld: 166 mg/dL — ABNORMAL HIGH (ref 65–99)
Potassium: 3.7 mmol/L (ref 3.5–5.1)
Sodium: 134 mmol/L — ABNORMAL LOW (ref 135–145)
Total Bilirubin: 0.4 mg/dL (ref 0.3–1.2)
Total Protein: 6.3 g/dL — ABNORMAL LOW (ref 6.5–8.1)

## 2017-02-06 LAB — MAGNESIUM: Magnesium: 1.8 mg/dL (ref 1.7–2.4)

## 2017-02-06 MED ORDER — DIPHENHYDRAMINE HCL 50 MG/ML IJ SOLN
25.0000 mg | Freq: Once | INTRAMUSCULAR | Status: AC
  Administered 2017-02-06: 25 mg via INTRAVENOUS
  Filled 2017-02-06: qty 1

## 2017-02-06 MED ORDER — TRASTUZUMAB CHEMO 150 MG IV SOLR
2.0000 mg/kg | Freq: Once | INTRAVENOUS | Status: AC
  Administered 2017-02-06: 105 mg via INTRAVENOUS
  Filled 2017-02-06: qty 5

## 2017-02-06 MED ORDER — FAMOTIDINE IN NACL 20-0.9 MG/50ML-% IV SOLN
20.0000 mg | Freq: Once | INTRAVENOUS | Status: AC
  Administered 2017-02-06: 20 mg via INTRAVENOUS
  Filled 2017-02-06: qty 50

## 2017-02-06 MED ORDER — PACLITAXEL CHEMO INJECTION 300 MG/50ML
80.0000 mg/m2 | Freq: Once | INTRAVENOUS | Status: AC
  Administered 2017-02-06: 126 mg via INTRAVENOUS
  Filled 2017-02-06: qty 21

## 2017-02-06 MED ORDER — HEPARIN SOD (PORK) LOCK FLUSH 100 UNIT/ML IV SOLN
500.0000 [IU] | Freq: Once | INTRAVENOUS | Status: AC | PRN
  Administered 2017-02-06: 500 [IU]

## 2017-02-06 MED ORDER — SODIUM CHLORIDE 0.9 % IV SOLN
Freq: Once | INTRAVENOUS | Status: AC
  Administered 2017-02-06: 11:00:00 via INTRAVENOUS
  Filled 2017-02-06: qty 100

## 2017-02-06 MED ORDER — DEXAMETHASONE SODIUM PHOSPHATE 100 MG/10ML IJ SOLN
20.0000 mg | Freq: Once | INTRAMUSCULAR | Status: AC
  Administered 2017-02-06: 20 mg via INTRAVENOUS
  Filled 2017-02-06: qty 2

## 2017-02-06 MED ORDER — ACETAMINOPHEN 325 MG PO TABS
650.0000 mg | ORAL_TABLET | Freq: Once | ORAL | Status: AC
  Administered 2017-02-06: 650 mg via ORAL
  Filled 2017-02-06: qty 2

## 2017-02-06 MED ORDER — SODIUM CHLORIDE 0.9 % IV SOLN
Freq: Once | INTRAVENOUS | Status: AC
  Administered 2017-02-06: 09:00:00 via INTRAVENOUS
  Filled 2017-02-06: qty 1000

## 2017-02-06 NOTE — Progress Notes (Signed)
Patient here today for follow up with labs and treatment with Taxol and Herceptin. She reports that this past Thursday, she had another episode of stomach pain and cramping. After she was able to finally have a bowel movement, she was weak and shaky for several days. She states that she is very upset about these episodes and wonders if there is something that can be prescribed for this. She states that she had a follow up with Dr. Ubaldo Glassing last week for her irregular heart beat, and no changes were made.

## 2017-02-09 DIAGNOSIS — L821 Other seborrheic keratosis: Secondary | ICD-10-CM | POA: Diagnosis not present

## 2017-02-09 DIAGNOSIS — L309 Dermatitis, unspecified: Secondary | ICD-10-CM | POA: Diagnosis not present

## 2017-02-09 DIAGNOSIS — L820 Inflamed seborrheic keratosis: Secondary | ICD-10-CM | POA: Diagnosis not present

## 2017-02-13 ENCOUNTER — Ambulatory Visit: Payer: Medicare HMO | Admitting: Oncology

## 2017-02-13 ENCOUNTER — Inpatient Hospital Stay: Payer: Medicare HMO

## 2017-02-13 VITALS — BP 138/81 | HR 72 | Temp 96.5°F | Resp 18

## 2017-02-13 DIAGNOSIS — Z5111 Encounter for antineoplastic chemotherapy: Secondary | ICD-10-CM | POA: Diagnosis not present

## 2017-02-13 DIAGNOSIS — C50312 Malignant neoplasm of lower-inner quadrant of left female breast: Secondary | ICD-10-CM

## 2017-02-13 DIAGNOSIS — R040 Epistaxis: Secondary | ICD-10-CM | POA: Diagnosis not present

## 2017-02-13 DIAGNOSIS — Z170 Estrogen receptor positive status [ER+]: Secondary | ICD-10-CM

## 2017-02-13 DIAGNOSIS — R140 Abdominal distension (gaseous): Secondary | ICD-10-CM | POA: Diagnosis not present

## 2017-02-13 DIAGNOSIS — R21 Rash and other nonspecific skin eruption: Secondary | ICD-10-CM | POA: Diagnosis not present

## 2017-02-13 DIAGNOSIS — Z5112 Encounter for antineoplastic immunotherapy: Secondary | ICD-10-CM | POA: Diagnosis not present

## 2017-02-13 DIAGNOSIS — R918 Other nonspecific abnormal finding of lung field: Secondary | ICD-10-CM | POA: Diagnosis not present

## 2017-02-13 DIAGNOSIS — Z171 Estrogen receptor negative status [ER-]: Secondary | ICD-10-CM | POA: Diagnosis not present

## 2017-02-13 DIAGNOSIS — E878 Other disorders of electrolyte and fluid balance, not elsewhere classified: Secondary | ICD-10-CM | POA: Diagnosis not present

## 2017-02-13 DIAGNOSIS — IMO0002 Reserved for concepts with insufficient information to code with codable children: Secondary | ICD-10-CM

## 2017-02-13 LAB — COMPREHENSIVE METABOLIC PANEL WITH GFR
ALT: 32 U/L (ref 14–54)
AST: 31 U/L (ref 15–41)
Albumin: 3.6 g/dL (ref 3.5–5.0)
Alkaline Phosphatase: 78 U/L (ref 38–126)
Anion gap: 7 (ref 5–15)
BUN: 8 mg/dL (ref 6–20)
CO2: 26 mmol/L (ref 22–32)
Calcium: 8.4 mg/dL — ABNORMAL LOW (ref 8.9–10.3)
Chloride: 98 mmol/L — ABNORMAL LOW (ref 101–111)
Creatinine, Ser: 0.68 mg/dL (ref 0.44–1.00)
GFR calc Af Amer: >60 mL/min (ref >60)
GFR calc non Af Amer: >60 mL/min (ref >60)
Glucose, Bld: 172 mg/dL — ABNORMAL HIGH (ref 65–99)
Potassium: 3.5 mmol/L (ref 3.5–5.1)
Sodium: 131 mmol/L — ABNORMAL LOW (ref 135–145)
Total Bilirubin: 0.5 mg/dL (ref 0.3–1.2)
Total Protein: 6.5 g/dL (ref 6.5–8.1)

## 2017-02-13 LAB — CBC WITH DIFFERENTIAL/PLATELET
Basophils Absolute: 0 10*3/uL (ref 0–0.1)
Basophils Relative: 1 %
Eosinophils Absolute: 0.1 10*3/uL (ref 0–0.7)
Eosinophils Relative: 3 %
HCT: 32 % — ABNORMAL LOW (ref 35.0–47.0)
Hemoglobin: 10.9 g/dL — ABNORMAL LOW (ref 12.0–16.0)
Lymphocytes Relative: 27 %
Lymphs Abs: 1 10*3/uL (ref 1.0–3.6)
MCH: 30.5 pg (ref 26.0–34.0)
MCHC: 34.1 g/dL (ref 32.0–36.0)
MCV: 89.5 fL (ref 80.0–100.0)
Monocytes Absolute: 0.2 10*3/uL (ref 0.2–0.9)
Monocytes Relative: 5 %
Neutro Abs: 2.4 10*3/uL (ref 1.4–6.5)
Neutrophils Relative %: 64 %
Platelets: 249 10*3/uL (ref 150–440)
RBC: 3.58 MIL/uL — ABNORMAL LOW (ref 3.80–5.20)
RDW: 13.5 % (ref 11.5–14.5)
WBC: 3.7 10*3/uL (ref 3.6–11.0)

## 2017-02-13 LAB — MAGNESIUM: Magnesium: 1.8 mg/dL (ref 1.7–2.4)

## 2017-02-13 MED ORDER — SODIUM CHLORIDE 0.9 % IV SOLN
Freq: Once | INTRAVENOUS | Status: AC
  Administered 2017-02-13: 10:00:00 via INTRAVENOUS
  Filled 2017-02-13: qty 1000

## 2017-02-13 MED ORDER — PACLITAXEL CHEMO INJECTION 300 MG/50ML
80.0000 mg/m2 | Freq: Once | INTRAVENOUS | Status: AC
  Administered 2017-02-13: 126 mg via INTRAVENOUS
  Filled 2017-02-13: qty 21

## 2017-02-13 MED ORDER — DIPHENHYDRAMINE HCL 50 MG/ML IJ SOLN
25.0000 mg | Freq: Once | INTRAMUSCULAR | Status: AC
  Administered 2017-02-13: 25 mg via INTRAVENOUS
  Filled 2017-02-13: qty 1

## 2017-02-13 MED ORDER — HEPARIN SOD (PORK) LOCK FLUSH 100 UNIT/ML IV SOLN
500.0000 [IU] | Freq: Once | INTRAVENOUS | Status: AC
  Administered 2017-02-13: 500 [IU] via INTRAVENOUS
  Filled 2017-02-13: qty 5

## 2017-02-13 MED ORDER — ACETAMINOPHEN 325 MG PO TABS
650.0000 mg | ORAL_TABLET | Freq: Once | ORAL | Status: AC
  Administered 2017-02-13: 650 mg via ORAL
  Filled 2017-02-13: qty 2

## 2017-02-13 MED ORDER — SODIUM CHLORIDE 0.9 % IV SOLN
Freq: Once | INTRAVENOUS | Status: AC
Start: 2017-02-13 — End: 2017-02-13
  Administered 2017-02-13: 10:00:00 via INTRAVENOUS
  Filled 2017-02-13: qty 1000

## 2017-02-13 MED ORDER — DEXAMETHASONE SODIUM PHOSPHATE 100 MG/10ML IJ SOLN
20.0000 mg | Freq: Once | INTRAMUSCULAR | Status: AC
  Administered 2017-02-13: 20 mg via INTRAVENOUS
  Filled 2017-02-13: qty 2

## 2017-02-13 MED ORDER — HEPARIN SOD (PORK) LOCK FLUSH 100 UNIT/ML IV SOLN
INTRAVENOUS | Status: AC
  Filled 2017-02-13: qty 5

## 2017-02-13 MED ORDER — SODIUM CHLORIDE 0.9% FLUSH
10.0000 mL | INTRAVENOUS | Status: DC | PRN
  Administered 2017-02-13: 10 mL via INTRAVENOUS
  Filled 2017-02-13: qty 10

## 2017-02-13 MED ORDER — FAMOTIDINE IN NACL 20-0.9 MG/50ML-% IV SOLN
20.0000 mg | Freq: Once | INTRAVENOUS | Status: AC
Start: 2017-02-13 — End: 2017-02-13
  Administered 2017-02-13: 20 mg via INTRAVENOUS
  Filled 2017-02-13: qty 50

## 2017-02-13 MED ORDER — TRASTUZUMAB CHEMO 150 MG IV SOLR
2.0000 mg/kg | Freq: Once | INTRAVENOUS | Status: AC
  Administered 2017-02-13: 105 mg via INTRAVENOUS
  Filled 2017-02-13: qty 5

## 2017-02-13 NOTE — Progress Notes (Signed)
Doris Barnes complains of skin spots since beginning chemo, a little itchy at times.  Also complains of nose bleeding every day since beginning chemo, not dripping blood, but bleeds on tissue/when scab in nose is removed. Dr. Janese Banks notified.

## 2017-02-19 NOTE — Progress Notes (Signed)
Hematology/Oncology Consult note [facility]  [phone] [phone]  Patient Care Team: Rusty Aus, MD as PCP - General (Internal Medicine)   Name of the patient: Doris Barnes  [id number]  04-16-1940   Date of visit: 02/19/17  Diagnosis- Stage IA invasive mammary carcinoma of the left breast ER negative, PR weakly positive and HER-2 positive  Chief complaint/ Reason for visit- on treatment assessment prior to cycle6 of taxol herceptin  Heme/Onc history:1. Patient is a 76 year old female who underwent bilateral screening mammogram on 11/28/2016 which showed a possible mass and calcifications in her left breast. This was followed by diagnostic mammogram and an ultrasound of the left breast which showed: 1.4 x 1.2 x 1.3 cm irregular mass in the left breast at the 6:30 position 3 cm from the nipple. On magnification views the overall extent of the mass and calcifications measures 1.9 cm. Left axilla was evaluated with ultrasound and showed no enlarged and morphologically abnormal lymph nodes.   2. She underwent core biopsy of the left breast mass which showed: Invasive mammary carcinoma, grade 3. No DCIS or lymphovascular invasion. ER was negative and PR was 11-50% positive and HER-2 was +3 on IHC  3Menarche at the age of 8 years. G5 P5 L5. She did have twoprior biopsiesof her left breast which did not reveal any malignancy. No family history of breast or ovarian cancer. Her sister and maternal grandfather had colon cancer. She is considerably anxious today at the thought of getting chemotherapy. She does have a history of mitral valve prolapse and states that she has "irregular heartbeat"for which she is on atenolol. She had a Holter monitor in the past but was not found to have any arrhythmia.Baseline MUGA scan showed a normal EF of 64.5%  4. Patient underwent lumpectomy on 01/01/2017 which showed invasive mammary carcinoma, size  of tumor was 14 mm, grade 3 with negative margins. 0 out of one lymph nodes was positive for malignancy. There was associated DCIS. Lymphovascular invasion was negative. ER negative, PR 11-50% positive and HER-2/neu +3  5. Adjuvant taxol herceptin chemo started on 01/16/17    Interval history-patient states she has been feeling more and more miserable since starting her chemotherapy.  She has absolutely no energy to do anything around the house.  After her last infusion she felt short of breath after she went home which lasted for a few hours and then resolved.  Her abdominal cramps have not recurred and her bowel movements are regular with MiraLAX.  She has now developed a rash in her bilateral hands which is extending onto her forearms.  Reports on and off mild intermittent nosebleeds especially when she tries to blow her nose  ECOG PS- 2 Pain scale- 0 Opioid associated constipation- no  Review of systems- Review of Systems  Constitutional: Positive for malaise/fatigue. Negative for chills, fever and weight loss.  HENT: Positive for nosebleeds. Negative for congestion and ear discharge.   Eyes: Negative for blurred vision.  Respiratory: Negative for cough, hemoptysis, sputum production, shortness of breath and wheezing.   Cardiovascular: Negative for chest pain, palpitations, orthopnea and claudication.  Gastrointestinal: Negative for abdominal pain, blood in stool, constipation, diarrhea, heartburn, melena, nausea and vomiting.  Genitourinary: Negative for dysuria, flank pain, frequency, hematuria and urgency.  Musculoskeletal: Negative for back pain, joint pain and myalgias.  Skin: Positive for rash.  Neurological: Positive for weakness. Negative for dizziness, tingling, focal weakness, seizures and headaches.  Endo/Heme/Allergies: Does not bruise/bleed  easily.  Psychiatric/Behavioral: Negative for depression and suicidal ideas. The patient does not have insomnia.        Allergies    Allergen Reactions  . Imipramine Pamoate Shortness Of Breath  . Bisphosphonates Nausea Only  . Epinephrine Other (See Comments)    Difficulty breathing  . Prednisone Palpitations  . Sulfa Antibiotics Rash    Face turns red and stings     Past Medical History:  Diagnosis Date  . Anemia   . Anxiety   . Asthma   . Cancer ([place]) skin  . Chronic vulvitis   . Colon polyp   . Cystocele   . Diverticulitis   . Diverticulitis   . Diverticulosis   . Diverticulosis   . Dyspareunia, female   . Dyspnea   . Dysrhythmia   . Fibrocystic disease of both breasts   . Gastritis   . GERD (gastroesophageal reflux disease)    gastritis also  . Hemorrhoids   . History of hiatal hernia   . Hypercholesteremia   . Hypertension   . IBS (irritable bowel syndrome)   . IC (interstitial cystitis)   . Migraine   . Mitral valve prolapse   . OP (osteoporosis)   . Positional vertigo   . Rectocele   . Squamous cell carcinoma   . Stroke [facility])    TIA  . Vaginal atrophy      Past Surgical History:  Procedure Laterality Date  . APPENDECTOMY    . BREAST BIOPSY Left 1998   core bx- neg  . BREAST BIOPSY Left 2007   neg  . BREAST BIOPSY Left 12/14/2016   left breast US core path pending  . BREAST EXCISIONAL BIOPSY Left   . BREAST LUMPECTOMY Left 01/01/2017    INVASIVE MAMMARY CARCINOMA/Grade 3  . CARDIAC CATHETERIZATION N/A 01/18/2015   Procedure: Left Heart Cath and Coronary Angiography;  Surgeon: Teodoro Spray, MD;  Location: [facility];  Service: Cardiovascular;  Laterality: N/A;  . CATARACT EXTRACTION W/PHACO Right 12/15/2015   Procedure: CATARACT EXTRACTION PHACO AND INTRAOCULAR LENS PLACEMENT (I[facility]);  Surgeon: Estill Cotta, MD;  Location: [facility] ORS;  Service: Ophthalmology;  Laterality: Right;  [place] 01:20 AP% 23.9 CDE 35.49 Fluid pack lot # [id number]  . CATARACT EXTRACTION W/PHACO Left 12/29/2015   Procedure: CATARACT EXTRACTION PHACO AND INTRAOCULAR LENS PLACEMENT ([place]);   Surgeon: Estill Cotta, MD;  Location: [facility] ORS;  Service: Ophthalmology;  Laterality: Left;  [place]  01:20 AP% 25.1 CDE 32.86 Fluid pack lot # [id number]  . COLON SURGERY    . COLONOSCOPY WITH PROPOFOL N/A 02/21/2016   Procedure: COLONOSCOPY WITH PROPOFOL;  Surgeon: Manya Silvas, MD;  Location: [facility] ENDOSCOPY;  Service: Endoscopy;  Laterality: N/A;  . CORONARY ANGIOPLASTY    . DILATION AND CURETTAGE OF UTERUS    . OOPHORECTOMY Bilateral   . PARTIAL MASTECTOMY WITH NEEDLE LOCALIZATION Left 01/01/2017   Procedure: PARTIAL MASTECTOMY WITH NEEDLE LOCALIZATION;  Surgeon: Herbert Pun, MD;  Location: [facility] ORS;  Service: General;  Laterality: Left;  . PORTACATH PLACEMENT Right 01/01/2017   Procedure: INSERTION PORT-A-CATH;  Surgeon: Herbert Pun, MD;  Location: [facility] ORS;  Service: General;  Laterality: Right;  . SENTINEL NODE BIOPSY Left 01/01/2017   Procedure: SENTINEL NODE BIOPSY;  Surgeon: Herbert Pun, MD;  Location: [facility] ORS;  Service: General;  Laterality: Left;  . TONSILLECTOMY    . VAGINAL HYSTERECTOMY      Social History   Socioeconomic History  . Marital status: Married  Spouse name: Not on file  . Number of children: Not on file  . Years of education: Not on file  . Highest education level: Not on file  Social Needs  . Financial resource strain: Not on file  . Food insecurity - worry: Not on file  . Food insecurity - inability: Not on file  . Transportation needs - medical: Not on file  . Transportation needs - non-medical: Not on file  Occupational History  . Not on file  Tobacco Use  . Smoking status: Former Smoker    Packs/day: 1.00    Years: 33.00    Pack years: 33.00    Last attempt to quit: 10/17/1987    Years since quitting: 29.3  . Smokeless tobacco: Never Used  Substance and Sexual Activity  . Alcohol use: No  . Drug use: No  . Sexual activity: Yes  Other Topics Concern  . Not on file  Social History Narrative  . Not on file      Family History  Problem Relation Age of Onset  . Diabetes Mother   . Diabetes Father   . Colon cancer Sister   . Diabetes Sister   . Diabetes Maternal Aunt   . Colon cancer Paternal Grandfather   . Lung cancer Brother   . Breast cancer Neg Hx   . Ovarian cancer Neg Hx      Current Outpatient Medications:  .  acetaminophen (TYLENOL) 325 MG tablet, Take 650 mg by mouth every 6 (six) hours as needed (for headaches.). , Disp: , Rfl:  .  ALPRAZolam (XANAX) 0.25 MG tablet, Take 0.125-0.25 mg by mouth 2 (two) times daily as needed for anxiety or sleep. , Disp: , Rfl:  .  aspirin EC 325 MG tablet, Take 325 mg by mouth daily with breakfast. , Disp: , Rfl:  .  atenolol (TENORMIN) 50 MG tablet, Take 50 mg by mouth daily with breakfast. , Disp: , Rfl:  .  calcium citrate-vitamin D (CITRACAL+D) 315-200 MG-UNIT tablet, Take 1 tablet by mouth 2 (two) times daily., Disp: , Rfl:  .  dicyclomine (BENTYL) 20 MG tablet, Take 1 tablet (20 mg total) by mouth 3 (three) times daily as needed for spasms., Disp: 30 tablet, Rfl: 0 .  FLUoxetine (PROZAC) 40 MG capsule, Take 40 mg by mouth at bedtime., Disp: , Rfl:  .  fluticasone (FLONASE) 50 MCG/ACT nasal spray, Place 2 sprays into both nostrils daily as needed for allergies. , Disp: , Rfl:  .  hydrochlorothiazide (HYDRODIURIL) 25 MG tablet, Take 25 mg by mouth daily. , Disp: , Rfl:  .  lansoprazole (PREVACID) 30 MG capsule, Take 30 mg by mouth daily before breakfast. , Disp: , Rfl:  .  lidocaine-prilocaine (EMLA) cream, Apply to affected area once, Disp: 30 g, Rfl: 3 .  loratadine (CLARITIN) 10 MG tablet, Take 10 mg by mouth daily as needed for allergies. , Disp: , Rfl:  .  Magnesium 250 MG TABS, Take 250 mg by mouth daily., Disp: , Rfl:  .  Multiple Vitamins-Minerals (PRESERVISION AREDS 2) CAPS, Take 1 capsule by mouth 2 (two) times daily. , Disp: , Rfl:  .  ondansetron (ZOFRAN) 8 MG tablet, Take 1 tablet (8 mg total) by mouth 2 (two) times daily as needed  (Nausea or vomiting)., Disp: 30 tablet, Rfl: 1 .  polyethylene glycol (MIRALAX / GLYCOLAX) packet, Take 17 g daily by mouth., Disp: , Rfl:  .  prochlorperazine (COMPAZINE) 10 MG tablet, Take 1 tablet (10 mg  total) by mouth every 6 (six) hours as needed (Nausea or vomiting)., Disp: 30 tablet, Rfl: 1 .  simethicone (GAS-X) 80 MG chewable tablet, Chew 1 tablet (80 mg total) every 6 (six) hours as needed by mouth for flatulence., Disp: 30 tablet, Rfl: 0 .  simvastatin (ZOCOR) 20 MG tablet, Take 20 mg by mouth daily at 8 pm., Disp: , Rfl:  .  telmisartan (MICARDIS) 80 MG tablet, Take 40 mg by mouth daily with breakfast., Disp: , Rfl:   Physical exam:  Vitals:   02/20/17 0856 02/20/17 0859  BP:  (!) 147/81  Pulse:  80  Resp:  16  Temp:  (!) 96.2 F (35.7 C)  TempSrc:  Tympanic  Weight: 126 lb (57.2 kg)    Physical Exam  Constitutional: She is oriented to person, place, and time and well-developed, well-nourished, and in no distress.  HENT:  Head: Normocephalic and atraumatic.  Eyes: EOM are normal. Pupils are equal, round, and reactive to light.  Neck: Normal range of motion.  Cardiovascular: Normal rate, regular rhythm and normal heart sounds.  Pulmonary/Chest: Effort normal and breath sounds normal.  Abdominal: Soft. Bowel sounds are normal.  Neurological: She is alert and oriented to person, place, and time.  Skin:  Erythematous maculopapular rash noted over bilateral hands especially in the region of the anatomical snuffbox..  Scattered irregular lesions are noted over bilateral forearms as well.     CMP Latest Ref Rng & Units 02/20/2017  Glucose 65 - 99 mg/dL 204(H)  BUN 6 - 20 mg/dL 9  Creatinine 0.44 - 1.00 mg/dL 0.66  Sodium 135 - 145 mmol/L 130(L)  Potassium 3.5 - 5.1 mmol/L 3.7  Chloride 101 - 111 mmol/L 98(L)  CO2 22 - 32 mmol/L 25  Calcium 8.9 - 10.3 mg/dL 8.4(L)  Total Protein 6.5 - 8.1 g/dL 6.2(L)  Total Bilirubin 0.3 - 1.2 mg/dL 0.4  Alkaline Phos 38 - 126 U/L 75    AST 15 - 41 U/L 31  ALT 14 - 54 U/L 33   CBC Latest Ref Rng & Units 02/20/2017  WBC 3.6 - 11.0 K/uL 3.0(L)  Hemoglobin 12.0 - 16.0 g/dL 10.5(L)  Hematocrit 35.0 - 47.0 % 30.4(L)  Platelets 150 - 440 K/uL 280    No images are attached to the encounter.  Ct Abdomen Pelvis W Contrast  Result Date: 01/23/2017 CLINICAL DATA:  76 year old female with abdominal pain. EXAM: CT ABDOMEN AND PELVIS WITH CONTRAST TECHNIQUE: Multidetector CT imaging of the abdomen and pelvis was performed using the standard protocol following bolus administration of intravenous contrast. CONTRAST:  170m ISOVUE-300 IOPAMIDOL (ISOVUE-300) INJECTION 61% COMPARISON:  Abdominal CT dated 11/23/2011 FINDINGS: Lower chest: Stop a 5 mm right lower lobe pulmonary nodule similar to 2013. A 5 mm left lung base nodule also appears stable. The visualized lung bases are clear. No intra-abdominal free air or free fluid. Hepatobiliary: The liver is unremarkable. No intrahepatic biliary ductal dilatation. The gallbladder is predominantly contracted. Pancreas: Unremarkable. No pancreatic ductal dilatation or surrounding inflammatory changes. Spleen: Normal in size without focal abnormality. Adrenals/Urinary Tract: The adrenal glands are unremarkable. The kidneys, visualized ureters, and urinary bladder appear unremarkable. Stomach/Bowel: There is sigmoid diverticulosis with muscular hypertrophy. Segmental thickened appearance of the sigmoid colon, likely chronic. Mild acute diverticulitis is less likely. Clinical correlation is recommended. No perisigmoid stranding noted. There is no evidence of bowel obstruction or active inflammation. Appendectomy. Vascular/Lymphatic: There is advanced aortoiliac atherosclerotic disease. The IVC is grossly unremarkable. No portal venous gas. There  is no adenopathy. Reproductive: Hysterectomy.  No pelvic mass. Other: Small fat containing umbilical hernia. Musculoskeletal: Osteopenia with degenerative changes of  the spine. No acute osseous pathology. IMPRESSION: 1. Sigmoid diverticulosis with muscular hypertrophy. No definite active inflammation. Segmental thickened appearance of the sigmoid colon likely chronic changes. Correlation with clinical exam is recommended. No bowel obstruction. 2.  Aortic Atherosclerosis (ICD10-I70.0). 3. Stable lung base nodules. Electronically Signed   By: Anner Crete M.D.   On: 01/23/2017 20:37     Assessment and plan- Patient is a 76 y.o. female pathologicalprognostic stage IA pT1c1p N0 cM0invasive mammary carcinoma of the left breast ER negative PR positive and HER-2 +3 on IHC s/p lumpectomy here for on treatment assessment prior to cycle6of weekly taxol and herceptin  Overall patient is feeling quite fatigued and rundown since her chemotherapy.  She is not having a good quality of life and says that she felt like she almost died after her cycle 4 of chemotherapy.  At this point I will plan to drop her Taxol and and only continue with weekly Herceptin.  She will proceed with cycle 6 of Herceptin today and directly proceed for cycle 7 of Herceptin next week.  I will see her back in 2 weeks time with CBC and CMP for cycle 8 of Herceptin and if she is doing well I will consider doing Abraxane at that time  Skin rash: Possibly secondary to Taxol.  I will prescribe topical steroid cream for her and if her rash continues to worsen over the next few days despite trying a topical steroid cream she will take 12.5 mg of p.o. Benadryl.  She has not been able to tolerate oral steroids in the past as it makes her heart race.  Nosebleeds: I have asked her to use over-the-counter saline spray to keep her nostrils moist   Visit Diagnosis 1. Malignant neoplasm of lower-inner quadrant of left breast in female, estrogen receptor positive ([place])   2. Encounter for monoclonal antibody treatment for malignancy   3. Skin rash      Dr. Randa Evens, MD, MPH [facility] Pager- [phone] 02/19/2017 9:28 AM

## 2017-02-20 ENCOUNTER — Inpatient Hospital Stay (HOSPITAL_BASED_OUTPATIENT_CLINIC_OR_DEPARTMENT_OTHER): Payer: Medicare HMO | Admitting: Oncology

## 2017-02-20 ENCOUNTER — Inpatient Hospital Stay: Payer: Medicare HMO

## 2017-02-20 ENCOUNTER — Other Ambulatory Visit: Payer: Self-pay | Admitting: *Deleted

## 2017-02-20 ENCOUNTER — Encounter: Payer: Self-pay | Admitting: Oncology

## 2017-02-20 VITALS — BP 147/81 | HR 80 | Temp 96.2°F | Resp 16 | Wt 126.0 lb

## 2017-02-20 DIAGNOSIS — K219 Gastro-esophageal reflux disease without esophagitis: Secondary | ICD-10-CM

## 2017-02-20 DIAGNOSIS — K5730 Diverticulosis of large intestine without perforation or abscess without bleeding: Secondary | ICD-10-CM

## 2017-02-20 DIAGNOSIS — J45909 Unspecified asthma, uncomplicated: Secondary | ICD-10-CM

## 2017-02-20 DIAGNOSIS — C50312 Malignant neoplasm of lower-inner quadrant of left female breast: Secondary | ICD-10-CM

## 2017-02-20 DIAGNOSIS — Z8673 Personal history of transient ischemic attack (TIA), and cerebral infarction without residual deficits: Secondary | ICD-10-CM

## 2017-02-20 DIAGNOSIS — Z170 Estrogen receptor positive status [ER+]: Principal | ICD-10-CM

## 2017-02-20 DIAGNOSIS — I341 Nonrheumatic mitral (valve) prolapse: Secondary | ICD-10-CM

## 2017-02-20 DIAGNOSIS — Z5111 Encounter for antineoplastic chemotherapy: Secondary | ICD-10-CM | POA: Diagnosis not present

## 2017-02-20 DIAGNOSIS — F419 Anxiety disorder, unspecified: Secondary | ICD-10-CM

## 2017-02-20 DIAGNOSIS — E878 Other disorders of electrolyte and fluid balance, not elsewhere classified: Secondary | ICD-10-CM | POA: Diagnosis not present

## 2017-02-20 DIAGNOSIS — Z5112 Encounter for antineoplastic immunotherapy: Secondary | ICD-10-CM | POA: Diagnosis not present

## 2017-02-20 DIAGNOSIS — R21 Rash and other nonspecific skin eruption: Secondary | ICD-10-CM | POA: Diagnosis not present

## 2017-02-20 DIAGNOSIS — E7800 Pure hypercholesterolemia, unspecified: Secondary | ICD-10-CM

## 2017-02-20 DIAGNOSIS — K449 Diaphragmatic hernia without obstruction or gangrene: Secondary | ICD-10-CM

## 2017-02-20 DIAGNOSIS — R040 Epistaxis: Secondary | ICD-10-CM | POA: Diagnosis not present

## 2017-02-20 DIAGNOSIS — R918 Other nonspecific abnormal finding of lung field: Secondary | ICD-10-CM

## 2017-02-20 DIAGNOSIS — M810 Age-related osteoporosis without current pathological fracture: Secondary | ICD-10-CM

## 2017-02-20 DIAGNOSIS — R140 Abdominal distension (gaseous): Secondary | ICD-10-CM | POA: Diagnosis not present

## 2017-02-20 DIAGNOSIS — K429 Umbilical hernia without obstruction or gangrene: Secondary | ICD-10-CM

## 2017-02-20 DIAGNOSIS — Z801 Family history of malignant neoplasm of trachea, bronchus and lung: Secondary | ICD-10-CM

## 2017-02-20 DIAGNOSIS — E871 Hypo-osmolality and hyponatremia: Secondary | ICD-10-CM | POA: Diagnosis not present

## 2017-02-20 DIAGNOSIS — Z79899 Other long term (current) drug therapy: Secondary | ICD-10-CM

## 2017-02-20 DIAGNOSIS — I700 Atherosclerosis of aorta: Secondary | ICD-10-CM

## 2017-02-20 DIAGNOSIS — Z86010 Personal history of colonic polyps: Secondary | ICD-10-CM

## 2017-02-20 DIAGNOSIS — Z87891 Personal history of nicotine dependence: Secondary | ICD-10-CM

## 2017-02-20 DIAGNOSIS — I10 Essential (primary) hypertension: Secondary | ICD-10-CM

## 2017-02-20 DIAGNOSIS — Z171 Estrogen receptor negative status [ER-]: Secondary | ICD-10-CM | POA: Diagnosis not present

## 2017-02-20 DIAGNOSIS — Z800 Family history of malignant neoplasm of digestive organs: Secondary | ICD-10-CM

## 2017-02-20 LAB — CBC WITH DIFFERENTIAL/PLATELET
Basophils Absolute: 0 10*3/uL (ref 0–0.1)
Basophils Relative: 1 %
Eosinophils Absolute: 0.1 10*3/uL (ref 0–0.7)
Eosinophils Relative: 4 %
HCT: 30.4 % — ABNORMAL LOW (ref 35.0–47.0)
Hemoglobin: 10.5 g/dL — ABNORMAL LOW (ref 12.0–16.0)
Lymphocytes Relative: 28 %
Lymphs Abs: 0.9 10*3/uL — ABNORMAL LOW (ref 1.0–3.6)
MCH: 30.8 pg (ref 26.0–34.0)
MCHC: 34.5 g/dL (ref 32.0–36.0)
MCV: 89.3 fL (ref 80.0–100.0)
Monocytes Absolute: 0.3 10*3/uL (ref 0.2–0.9)
Monocytes Relative: 9 %
Neutro Abs: 1.8 10*3/uL (ref 1.4–6.5)
Neutrophils Relative %: 58 %
Platelets: 280 10*3/uL (ref 150–440)
RBC: 3.41 MIL/uL — ABNORMAL LOW (ref 3.80–5.20)
RDW: 13.9 % (ref 11.5–14.5)
WBC: 3 10*3/uL — ABNORMAL LOW (ref 3.6–11.0)

## 2017-02-20 LAB — COMPREHENSIVE METABOLIC PANEL
ALT: 33 U/L (ref 14–54)
AST: 31 U/L (ref 15–41)
Albumin: 3.5 g/dL (ref 3.5–5.0)
Alkaline Phosphatase: 75 U/L (ref 38–126)
Anion gap: 7 (ref 5–15)
BUN: 9 mg/dL (ref 6–20)
CO2: 25 mmol/L (ref 22–32)
Calcium: 8.4 mg/dL — ABNORMAL LOW (ref 8.9–10.3)
Chloride: 98 mmol/L — ABNORMAL LOW (ref 101–111)
Creatinine, Ser: 0.66 mg/dL (ref 0.44–1.00)
GFR calc Af Amer: >60 mL/min (ref >60)
GFR calc non Af Amer: >60 mL/min (ref >60)
Glucose, Bld: 204 mg/dL — ABNORMAL HIGH (ref 65–99)
Potassium: 3.7 mmol/L (ref 3.5–5.1)
Sodium: 130 mmol/L — ABNORMAL LOW (ref 135–145)
Total Bilirubin: 0.4 mg/dL (ref 0.3–1.2)
Total Protein: 6.2 g/dL — ABNORMAL LOW (ref 6.5–8.1)

## 2017-02-20 LAB — MAGNESIUM: Magnesium: 1.9 mg/dL (ref 1.7–2.4)

## 2017-02-20 MED ORDER — HEPARIN SOD (PORK) LOCK FLUSH 100 UNIT/ML IV SOLN
500.0000 [IU] | Freq: Once | INTRAVENOUS | Status: AC
  Administered 2017-02-20: 500 [IU] via INTRAVENOUS
  Filled 2017-02-20: qty 5

## 2017-02-20 MED ORDER — TRASTUZUMAB CHEMO 150 MG IV SOLR
2.0000 mg/kg | Freq: Once | INTRAVENOUS | Status: AC
  Administered 2017-02-20: 105 mg via INTRAVENOUS
  Filled 2017-02-20: qty 5

## 2017-02-20 MED ORDER — TRIAMCINOLONE ACETONIDE 0.5 % EX OINT: 1.0000 "application " | TOPICAL_OINTMENT | Freq: Two times a day (BID) | CUTANEOUS | 0 refills | Status: DC

## 2017-02-20 MED ORDER — SODIUM CHLORIDE 0.9 % IV SOLN
Freq: Once | INTRAVENOUS | Status: AC
  Administered 2017-02-20: 10:00:00 via INTRAVENOUS
  Filled 2017-02-20: qty 1000

## 2017-02-20 MED ORDER — SODIUM CHLORIDE 0.9% FLUSH
10.0000 mL | INTRAVENOUS | Status: DC | PRN
  Administered 2017-02-20: 10 mL via INTRAVENOUS
  Filled 2017-02-20: qty 10

## 2017-02-20 MED ORDER — ACETAMINOPHEN 325 MG PO TABS
650.0000 mg | ORAL_TABLET | Freq: Once | ORAL | Status: AC
  Administered 2017-02-20: 650 mg via ORAL
  Filled 2017-02-20: qty 2

## 2017-02-20 NOTE — Progress Notes (Signed)
Per Judeen Hammans, only give Tylenol for pre-med today with Herceptin.

## 2017-02-20 NOTE — Progress Notes (Signed)
Patient here for follow up with labs and treatment with Taxol and Herceptin. She states that she is weak and shaky today. She denies having any pain. She states that she has felt terrible since her last chemo treatment. She reports extreme fatigue and thought she was "dying" on Thanksgiving day because she felt so ill. She has a red, itchy, painful rash on her hands and arms, and states that she thinks it is moving up to her face. She reports having blood clots in her nose every morning when she wakes up and her nose bleeds throughout the day every day.

## 2017-02-27 ENCOUNTER — Inpatient Hospital Stay: Payer: Medicare HMO

## 2017-02-27 ENCOUNTER — Inpatient Hospital Stay: Payer: Medicare HMO | Attending: Oncology

## 2017-02-27 ENCOUNTER — Other Ambulatory Visit: Payer: Self-pay | Admitting: Oncology

## 2017-02-27 VITALS — BP 144/78 | HR 76 | Temp 97.2°F | Resp 18 | Wt 127.0 lb

## 2017-02-27 DIAGNOSIS — Z9011 Acquired absence of right breast and nipple: Secondary | ICD-10-CM | POA: Diagnosis not present

## 2017-02-27 DIAGNOSIS — N3090 Cystitis, unspecified without hematuria: Secondary | ICD-10-CM | POA: Diagnosis not present

## 2017-02-27 DIAGNOSIS — Z87891 Personal history of nicotine dependence: Secondary | ICD-10-CM | POA: Diagnosis not present

## 2017-02-27 DIAGNOSIS — K219 Gastro-esophageal reflux disease without esophagitis: Secondary | ICD-10-CM | POA: Diagnosis not present

## 2017-02-27 DIAGNOSIS — Z86010 Personal history of colonic polyps: Secondary | ICD-10-CM | POA: Diagnosis not present

## 2017-02-27 DIAGNOSIS — I10 Essential (primary) hypertension: Secondary | ICD-10-CM | POA: Insufficient documentation

## 2017-02-27 DIAGNOSIS — C50312 Malignant neoplasm of lower-inner quadrant of left female breast: Secondary | ICD-10-CM | POA: Diagnosis not present

## 2017-02-27 DIAGNOSIS — K449 Diaphragmatic hernia without obstruction or gangrene: Secondary | ICD-10-CM | POA: Diagnosis not present

## 2017-02-27 DIAGNOSIS — M810 Age-related osteoporosis without current pathological fracture: Secondary | ICD-10-CM | POA: Insufficient documentation

## 2017-02-27 DIAGNOSIS — Z5112 Encounter for antineoplastic immunotherapy: Secondary | ICD-10-CM | POA: Insufficient documentation

## 2017-02-27 DIAGNOSIS — Z8619 Personal history of other infectious and parasitic diseases: Secondary | ICD-10-CM | POA: Insufficient documentation

## 2017-02-27 DIAGNOSIS — E7800 Pure hypercholesterolemia, unspecified: Secondary | ICD-10-CM | POA: Insufficient documentation

## 2017-02-27 DIAGNOSIS — Z171 Estrogen receptor negative status [ER-]: Secondary | ICD-10-CM | POA: Diagnosis not present

## 2017-02-27 DIAGNOSIS — F419 Anxiety disorder, unspecified: Secondary | ICD-10-CM | POA: Insufficient documentation

## 2017-02-27 DIAGNOSIS — Z79899 Other long term (current) drug therapy: Secondary | ICD-10-CM | POA: Diagnosis not present

## 2017-02-27 DIAGNOSIS — I499 Cardiac arrhythmia, unspecified: Secondary | ICD-10-CM | POA: Insufficient documentation

## 2017-02-27 DIAGNOSIS — R21 Rash and other nonspecific skin eruption: Secondary | ICD-10-CM | POA: Insufficient documentation

## 2017-02-27 DIAGNOSIS — Z170 Estrogen receptor positive status [ER+]: Secondary | ICD-10-CM

## 2017-02-27 DIAGNOSIS — Z5111 Encounter for antineoplastic chemotherapy: Secondary | ICD-10-CM | POA: Insufficient documentation

## 2017-02-27 DIAGNOSIS — Z8669 Personal history of other diseases of the nervous system and sense organs: Secondary | ICD-10-CM | POA: Diagnosis not present

## 2017-02-27 DIAGNOSIS — D649 Anemia, unspecified: Secondary | ICD-10-CM | POA: Insufficient documentation

## 2017-02-27 DIAGNOSIS — Z8673 Personal history of transient ischemic attack (TIA), and cerebral infarction without residual deficits: Secondary | ICD-10-CM | POA: Insufficient documentation

## 2017-02-27 DIAGNOSIS — I341 Nonrheumatic mitral (valve) prolapse: Secondary | ICD-10-CM | POA: Insufficient documentation

## 2017-02-27 DIAGNOSIS — Z800 Family history of malignant neoplasm of digestive organs: Secondary | ICD-10-CM | POA: Insufficient documentation

## 2017-02-27 LAB — CBC WITH DIFFERENTIAL/PLATELET
Basophils Absolute: 0.1 10*3/uL (ref 0–0.1)
Basophils Relative: 1 %
Eosinophils Absolute: 0.1 10*3/uL (ref 0–0.7)
Eosinophils Relative: 3 %
HCT: 31.7 % — ABNORMAL LOW (ref 35.0–47.0)
Hemoglobin: 10.9 g/dL — ABNORMAL LOW (ref 12.0–16.0)
Lymphocytes Relative: 20 %
Lymphs Abs: 0.9 10*3/uL — ABNORMAL LOW (ref 1.0–3.6)
MCH: 30.5 pg (ref 26.0–34.0)
MCHC: 34.2 g/dL (ref 32.0–36.0)
MCV: 89.1 fL (ref 80.0–100.0)
Monocytes Absolute: 0.6 10*3/uL (ref 0.2–0.9)
Monocytes Relative: 14 %
Neutro Abs: 2.7 10*3/uL (ref 1.4–6.5)
Neutrophils Relative %: 62 %
Platelets: 285 10*3/uL (ref 150–440)
RBC: 3.56 MIL/uL — ABNORMAL LOW (ref 3.80–5.20)
RDW: 14.2 % (ref 11.5–14.5)
WBC: 4.3 10*3/uL (ref 3.6–11.0)

## 2017-02-27 LAB — COMPREHENSIVE METABOLIC PANEL
ALT: 24 U/L (ref 14–54)
AST: 30 U/L (ref 15–41)
Albumin: 3.4 g/dL — ABNORMAL LOW (ref 3.5–5.0)
Alkaline Phosphatase: 66 U/L (ref 38–126)
Anion gap: 8 (ref 5–15)
BUN: 10 mg/dL (ref 6–20)
CO2: 26 mmol/L (ref 22–32)
Calcium: 8.7 mg/dL — ABNORMAL LOW (ref 8.9–10.3)
Chloride: 99 mmol/L — ABNORMAL LOW (ref 101–111)
Creatinine, Ser: 0.69 mg/dL (ref 0.44–1.00)
GFR calc Af Amer: >60 mL/min (ref >60)
GFR calc non Af Amer: >60 mL/min (ref >60)
Glucose, Bld: 174 mg/dL — ABNORMAL HIGH (ref 65–99)
Potassium: 3.6 mmol/L (ref 3.5–5.1)
Sodium: 133 mmol/L — ABNORMAL LOW (ref 135–145)
Total Bilirubin: 0.4 mg/dL (ref 0.3–1.2)
Total Protein: 6.3 g/dL — ABNORMAL LOW (ref 6.5–8.1)

## 2017-02-27 MED ORDER — SODIUM CHLORIDE 0.9 % IV SOLN
Freq: Once | INTRAVENOUS | Status: AC
  Administered 2017-02-27: 09:00:00 via INTRAVENOUS
  Filled 2017-02-27: qty 1000

## 2017-02-27 MED ORDER — ACETAMINOPHEN 325 MG PO TABS
650.0000 mg | ORAL_TABLET | Freq: Once | ORAL | Status: AC
  Administered 2017-02-27: 650 mg via ORAL
  Filled 2017-02-27: qty 2

## 2017-02-27 MED ORDER — SODIUM CHLORIDE 0.9% FLUSH
10.0000 mL | Freq: Once | INTRAVENOUS | Status: AC
  Administered 2017-02-27: 10 mL via INTRAVENOUS
  Filled 2017-02-27: qty 10

## 2017-02-27 MED ORDER — TRASTUZUMAB CHEMO 150 MG IV SOLR
2.0000 mg/kg | Freq: Once | INTRAVENOUS | Status: AC
  Administered 2017-02-27: 105 mg via INTRAVENOUS
  Filled 2017-02-27: qty 5

## 2017-02-27 MED ORDER — HEPARIN SOD (PORK) LOCK FLUSH 100 UNIT/ML IV SOLN
500.0000 [IU] | Freq: Once | INTRAVENOUS | Status: AC
  Administered 2017-02-27: 500 [IU] via INTRAVENOUS
  Filled 2017-02-27: qty 5

## 2017-03-06 ENCOUNTER — Encounter: Payer: Self-pay | Admitting: Oncology

## 2017-03-06 ENCOUNTER — Inpatient Hospital Stay: Payer: Medicare HMO

## 2017-03-06 ENCOUNTER — Inpatient Hospital Stay (HOSPITAL_BASED_OUTPATIENT_CLINIC_OR_DEPARTMENT_OTHER): Payer: Medicare HMO | Admitting: Oncology

## 2017-03-06 VITALS — BP 143/79 | HR 81 | Temp 97.9°F | Resp 18 | Ht 62.0 in | Wt 126.0 lb

## 2017-03-06 DIAGNOSIS — I341 Nonrheumatic mitral (valve) prolapse: Secondary | ICD-10-CM | POA: Diagnosis not present

## 2017-03-06 DIAGNOSIS — R21 Rash and other nonspecific skin eruption: Secondary | ICD-10-CM | POA: Diagnosis not present

## 2017-03-06 DIAGNOSIS — Z79899 Other long term (current) drug therapy: Secondary | ICD-10-CM

## 2017-03-06 DIAGNOSIS — C50312 Malignant neoplasm of lower-inner quadrant of left female breast: Secondary | ICD-10-CM

## 2017-03-06 DIAGNOSIS — I10 Essential (primary) hypertension: Secondary | ICD-10-CM

## 2017-03-06 DIAGNOSIS — E7800 Pure hypercholesterolemia, unspecified: Secondary | ICD-10-CM | POA: Diagnosis not present

## 2017-03-06 DIAGNOSIS — Z170 Estrogen receptor positive status [ER+]: Principal | ICD-10-CM

## 2017-03-06 DIAGNOSIS — K449 Diaphragmatic hernia without obstruction or gangrene: Secondary | ICD-10-CM

## 2017-03-06 DIAGNOSIS — Z86010 Personal history of colonic polyps: Secondary | ICD-10-CM

## 2017-03-06 DIAGNOSIS — N3090 Cystitis, unspecified without hematuria: Secondary | ICD-10-CM

## 2017-03-06 DIAGNOSIS — I499 Cardiac arrhythmia, unspecified: Secondary | ICD-10-CM

## 2017-03-06 DIAGNOSIS — Z800 Family history of malignant neoplasm of digestive organs: Secondary | ICD-10-CM

## 2017-03-06 DIAGNOSIS — D649 Anemia, unspecified: Secondary | ICD-10-CM

## 2017-03-06 DIAGNOSIS — K219 Gastro-esophageal reflux disease without esophagitis: Secondary | ICD-10-CM

## 2017-03-06 DIAGNOSIS — Z5112 Encounter for antineoplastic immunotherapy: Secondary | ICD-10-CM

## 2017-03-06 DIAGNOSIS — Z8669 Personal history of other diseases of the nervous system and sense organs: Secondary | ICD-10-CM

## 2017-03-06 DIAGNOSIS — M810 Age-related osteoporosis without current pathological fracture: Secondary | ICD-10-CM

## 2017-03-06 DIAGNOSIS — F419 Anxiety disorder, unspecified: Secondary | ICD-10-CM

## 2017-03-06 DIAGNOSIS — Z9011 Acquired absence of right breast and nipple: Secondary | ICD-10-CM

## 2017-03-06 DIAGNOSIS — Z8619 Personal history of other infectious and parasitic diseases: Secondary | ICD-10-CM | POA: Diagnosis not present

## 2017-03-06 DIAGNOSIS — Z8673 Personal history of transient ischemic attack (TIA), and cerebral infarction without residual deficits: Secondary | ICD-10-CM

## 2017-03-06 DIAGNOSIS — Z87891 Personal history of nicotine dependence: Secondary | ICD-10-CM

## 2017-03-06 DIAGNOSIS — Z171 Estrogen receptor negative status [ER-]: Secondary | ICD-10-CM

## 2017-03-06 DIAGNOSIS — Z5111 Encounter for antineoplastic chemotherapy: Secondary | ICD-10-CM | POA: Diagnosis not present

## 2017-03-06 LAB — COMPREHENSIVE METABOLIC PANEL
ALT: 21 U/L (ref 14–54)
AST: 26 U/L (ref 15–41)
Albumin: 3.6 g/dL (ref 3.5–5.0)
Alkaline Phosphatase: 72 U/L (ref 38–126)
Anion gap: 8 (ref 5–15)
BUN: 10 mg/dL (ref 6–20)
CO2: 27 mmol/L (ref 22–32)
Calcium: 8.7 mg/dL — ABNORMAL LOW (ref 8.9–10.3)
Chloride: 99 mmol/L — ABNORMAL LOW (ref 101–111)
Creatinine, Ser: 0.63 mg/dL (ref 0.44–1.00)
GFR calc Af Amer: >60 mL/min (ref >60)
GFR calc non Af Amer: >60 mL/min (ref >60)
Glucose, Bld: 137 mg/dL — ABNORMAL HIGH (ref 65–99)
Potassium: 3.8 mmol/L (ref 3.5–5.1)
Sodium: 134 mmol/L — ABNORMAL LOW (ref 135–145)
Total Bilirubin: 0.5 mg/dL (ref 0.3–1.2)
Total Protein: 6.5 g/dL (ref 6.5–8.1)

## 2017-03-06 LAB — CBC WITH DIFFERENTIAL/PLATELET
Basophils Absolute: 0 10*3/uL (ref 0–0.1)
Basophils Relative: 1 %
Eosinophils Absolute: 0.4 10*3/uL (ref 0–0.7)
Eosinophils Relative: 5 %
HCT: 33.5 % — ABNORMAL LOW (ref 35.0–47.0)
Hemoglobin: 11.4 g/dL — ABNORMAL LOW (ref 12.0–16.0)
Lymphocytes Relative: 20 %
Lymphs Abs: 1.5 10*3/uL (ref 1.0–3.6)
MCH: 30.3 pg (ref 26.0–34.0)
MCHC: 34 g/dL (ref 32.0–36.0)
MCV: 89 fL (ref 80.0–100.0)
Monocytes Absolute: 0.7 10*3/uL (ref 0.2–0.9)
Monocytes Relative: 10 %
Neutro Abs: 4.8 10*3/uL (ref 1.4–6.5)
Neutrophils Relative %: 64 %
Platelets: 290 10*3/uL (ref 150–440)
RBC: 3.76 MIL/uL — ABNORMAL LOW (ref 3.80–5.20)
RDW: 14.9 % — ABNORMAL HIGH (ref 11.5–14.5)
WBC: 7.5 10*3/uL (ref 3.6–11.0)

## 2017-03-06 MED ORDER — HEPARIN SOD (PORK) LOCK FLUSH 100 UNIT/ML IV SOLN
500.0000 [IU] | Freq: Once | INTRAVENOUS | Status: AC | PRN
  Administered 2017-03-06: 500 [IU]
  Filled 2017-03-06: qty 5

## 2017-03-06 MED ORDER — ACETAMINOPHEN 325 MG PO TABS
650.0000 mg | ORAL_TABLET | Freq: Once | ORAL | Status: AC
  Administered 2017-03-06: 650 mg via ORAL
  Filled 2017-03-06: qty 2

## 2017-03-06 MED ORDER — TRASTUZUMAB CHEMO 150 MG IV SOLR
2.0000 mg/kg | Freq: Once | INTRAVENOUS | Status: AC
  Administered 2017-03-06: 105 mg via INTRAVENOUS
  Filled 2017-03-06: qty 5

## 2017-03-06 MED ORDER — SODIUM CHLORIDE 0.9 % IV SOLN
Freq: Once | INTRAVENOUS | Status: AC
  Administered 2017-03-06: 10:00:00 via INTRAVENOUS
  Filled 2017-03-06: qty 1000

## 2017-03-06 NOTE — Addendum Note (Signed)
Addended by: Randa Evens C on: 03/06/2017 11:05 AM   Modules accepted: Orders

## 2017-03-06 NOTE — Progress Notes (Signed)
Hematology/Oncology Consult note [facility]  [phone] [phone]  Patient Care Team: Rusty Aus, MD as PCP - General (Internal Medicine)   Name of the patient: Doris Barnes  [id number]  1940-06-09   Date of visit: 03/06/17  Date of visit: 02/19/17  Diagnosis- Stage IA invasive mammary carcinoma of the left breast ER negative, PR weakly positive and HER-2 positive  Chief complaint/ Reason for visit- on treatment assessment prior to cycle8 of weekly herceptin  Heme/Onc history:1. Patient is a 76 year old female who underwent bilateral screening mammogram on 11/28/2016 which showed a possible mass and calcifications in her left breast. This was followed by diagnostic mammogram and an ultrasound of the left breast which showed: 1.4 x 1.2 x 1.3 cm irregular mass in the left breast at the 6:30 position 3 cm from the nipple. On magnification views the overall extent of the mass and calcifications measures 1.9 cm. Left axilla was evaluated with ultrasound and showed no enlarged and morphologically abnormal lymph nodes.   2. She underwent core biopsy of the left breast mass which showed: Invasive mammary carcinoma, grade 3. No DCIS or lymphovascular invasion. ER was negative and PR was 11-50% positive and HER-2 was +3 on IHC  3Menarche at the age of 75 years. G5 P5 L5. She did have twoprior biopsiesof her left breast which did not reveal any malignancy. No family history of breast or ovarian cancer. Her sister and maternal grandfather had colon cancer. She is considerably anxious today at the thought of getting chemotherapy. She does have a history of mitral valve prolapse and states that she has "irregular heartbeat"for which she is on atenolol. She had a Holter monitor in the past but was not found to have any arrhythmia.Baseline MUGA scan showed a normal EF of 64.5%  4. Patient underwent lumpectomy on 01/01/2017 which showed  invasive mammary carcinoma, size of tumor was 14 mm, grade 3 with negative margins. 0 out of one lymph nodes was positive for malignancy. There was associated DCIS. Lymphovascular invasion was negative. ER negative, PR 11-50% positive and HER-2/neu +3  5. Adjuvant taxol herceptin chemo started on 01/16/17.  Patient had significant fatigue abdominal bloating and Cramping as well as nosebleeds from Taxol and did not wish to continue with Taxol.  She received 5 cycles of weekly Taxol.    Interval history-patient has not received any Taxol for the last 2 weeks and feels back to her baseline today.  Reports no abdominal cramping.  Denies any nosebleeds.  Skin rash is improving and energy levels are better  ECOG PS- 1 Pain scale- 0   Review of systems- Review of Systems  Constitutional: Negative for chills, fever, malaise/fatigue and weight loss.  HENT: Negative for congestion, ear discharge and nosebleeds.   Eyes: Negative for blurred vision.  Respiratory: Negative for cough, hemoptysis, sputum production, shortness of breath and wheezing.   Cardiovascular: Negative for chest pain, palpitations, orthopnea and claudication.  Gastrointestinal: Negative for abdominal pain, blood in stool, constipation, diarrhea, heartburn, melena, nausea and vomiting.  Genitourinary: Negative for dysuria, flank pain, frequency, hematuria and urgency.  Musculoskeletal: Negative for back pain, joint pain and myalgias.  Skin: Positive for rash.  Neurological: Negative for dizziness, tingling, focal weakness, seizures, weakness and headaches.  Endo/Heme/Allergies: Does not bruise/bleed easily.  Psychiatric/Behavioral: Negative for depression and suicidal ideas. The patient does not have insomnia.       Allergies  Allergen Reactions  . Imipramine Pamoate Shortness Of Breath  .  Bisphosphonates Nausea Only  . Epinephrine Other (See Comments)    Difficulty breathing  . Prednisone Palpitations  . Sulfa Antibiotics  Rash    Face turns red and stings     Past Medical History:  Diagnosis Date  . Anemia   . Anxiety   . Asthma   . Breast cancer ([place])   . Cancer (Interior) skin  . Chronic vulvitis   . Colon polyp   . Cystocele   . Diverticulitis   . Diverticulitis   . Diverticulosis   . Diverticulosis   . Dyspareunia, female   . Dyspnea   . Dysrhythmia   . Fibrocystic disease of both breasts   . Gastritis   . GERD (gastroesophageal reflux disease)    gastritis also  . Hemorrhoids   . History of hiatal hernia   . Hypercholesteremia   . Hypertension   . IBS (irritable bowel syndrome)   . IC (interstitial cystitis)   . Migraine   . Mitral valve prolapse   . OP (osteoporosis)   . Positional vertigo   . Rectocele   . Squamous cell carcinoma   . Stroke [facility])    TIA  . Vaginal atrophy      Past Surgical History:  Procedure Laterality Date  . APPENDECTOMY    . BREAST BIOPSY Left 1998   core bx- neg  . BREAST BIOPSY Left 2007   neg  . BREAST BIOPSY Left 12/14/2016   left breast US core path pending  . BREAST EXCISIONAL BIOPSY Left   . BREAST LUMPECTOMY Left 01/01/2017    INVASIVE MAMMARY CARCINOMA/Grade 3  . CARDIAC CATHETERIZATION N/A 01/18/2015   Procedure: Left Heart Cath and Coronary Angiography;  Surgeon: Teodoro Spray, MD;  Location: [place] [facility];  Service: Cardiovascular;  Laterality: N/A;  . CATARACT EXTRACTION W/PHACO Right 12/15/2015   Procedure: CATARACT EXTRACTION PHACO AND INTRAOCULAR LENS PLACEMENT (IOC);  Surgeon: Estill Cotta, MD;  Location: [facility] ORS;  Service: Ophthalmology;  Laterality: Right;  [place] 01:20 AP% 23.9 CDE 35.49 Fluid pack lot # [id number]  . CATARACT EXTRACTION W/PHACO Left 12/29/2015   Procedure: CATARACT EXTRACTION PHACO AND INTRAOCULAR LENS PLACEMENT ([place]);  Surgeon: Estill Cotta, MD;  Location: [facility] OR[facility];  Service: Ophthalmology;  Laterality: Left;  [place]  01:20 AP% 25.1 CDE 32.86 Fluid pack lot # [id number]  . COLON SURGERY    .  COLONOSCOPY WITH PROPOFOL N/A 02/21/2016   Procedure: COLONOSCOPY WITH PROPOFOL;  Surgeon: Manya Silvas, MD;  Location: [facility];  Service: Endoscopy;  Laterality: N/A;  . CORONARY ANGIOPLASTY    . DILATION AND CURETTAGE OF UTERUS    . OOPHORECTOMY Bilateral   . PARTIAL MASTECTOMY WITH NEEDLE LOCALIZATION Left 01/01/2017   Procedure: PARTIAL MASTECTOMY WITH NEEDLE LOCALIZATION;  Surgeon: Herbert Pun, MD;  Location: [facility] ORS;  Service: General;  Laterality: Left;  . PORTACATH PLACEMENT Right 01/01/2017   Procedure: INSERTION PORT-A-CATH;  Surgeon: Herbert Pun, MD;  Location: [facility] ORS;  Service: General;  Laterality: Right;  . SENTINEL NODE BIOPSY Left 01/01/2017   Procedure: SENTINEL NODE BIOPSY;  Surgeon: Herbert Pun, MD;  Location: [facility] ORS;  Service: General;  Laterality: Left;  . TONSILLECTOMY    . VAGINAL HYSTERECTOMY      Social History   Socioeconomic History  . Marital status: Married    Spouse name: Not on file  . Number of children: Not on file  . Years of education: Not on file  . Highest education level: Not on file  Social Needs  . Financial resource strain: Not on file  . Food insecurity - worry: Not on file  . Food insecurity - inability: Not on file  . Transportation needs - medical: Not on file  . Transportation needs - non-medical: Not on file  Occupational History  . Not on file  Tobacco Use  . Smoking status: Former Smoker    Packs/day: 1.00    Years: 33.00    Pack years: 33.00    Last attempt to quit: 10/17/1987    Years since quitting: 29.4  . Smokeless tobacco: Never Used  Substance and Sexual Activity  . Alcohol use: No  . Drug use: No  . Sexual activity: Yes  Other Topics Concern  . Not on file  Social History Narrative  . Not on file    Family History  Problem Relation Age of Onset  . Diabetes Mother   . Diabetes Father   . Colon cancer Sister   . Diabetes Sister   . Diabetes Maternal Aunt   . Colon  cancer Paternal Grandfather   . Lung cancer Brother   . Breast cancer Neg Hx   . Ovarian cancer Neg Hx      Current Outpatient Medications:  .  acetaminophen (TYLENOL) 325 MG tablet, Take 650 mg by mouth every 6 (six) hours as needed (for headaches.). , Disp: , Rfl:  .  ALPRAZolam (XANAX) 0.25 MG tablet, Take 0.125-0.25 mg by mouth 2 (two) times daily as needed for anxiety or sleep. , Disp: , Rfl:  .  aspirin EC 325 MG tablet, Take 325 mg by mouth daily with breakfast. , Disp: , Rfl:  .  atenolol (TENORMIN) 50 MG tablet, Take 50 mg by mouth daily with breakfast. , Disp: , Rfl:  .  calcium citrate-vitamin D (CITRACAL+D) 315-200 MG-UNIT tablet, Take 1 tablet by mouth 2 (two) times daily., Disp: , Rfl:  .  dicyclomine (BENTYL) 20 MG tablet, Take 1 tablet (20 mg total) by mouth 3 (three) times daily as needed for spasms., Disp: 30 tablet, Rfl: 0 .  diphenhydrAMINE (BENADRYL) 25 MG tablet, Take 12.5 mg by mouth every 6 (six) hours as needed for itching (from rash)., Disp: , Rfl:  .  FLUoxetine (PROZAC) 40 MG capsule, Take 40 mg by mouth at bedtime., Disp: , Rfl:  .  fluticasone (FLONASE) 50 MCG/ACT nasal spray, Place 2 sprays into both nostrils daily as needed for allergies. , Disp: , Rfl:  .  hydrochlorothiazide (HYDRODIURIL) 25 MG tablet, Take 25 mg by mouth daily. , Disp: , Rfl:  .  lansoprazole (PREVACID) 30 MG capsule, Take 30 mg by mouth daily before breakfast. , Disp: , Rfl:  .  lidocaine-prilocaine (EMLA) cream, Apply to affected area once, Disp: 30 g, Rfl: 3 .  loratadine (CLARITIN) 10 MG tablet, Take 10 mg by mouth daily as needed for allergies. , Disp: , Rfl:  .  Magnesium 250 MG TABS, Take 250 mg by mouth daily., Disp: , Rfl:  .  Multiple Vitamins-Minerals (PRESERVISION AREDS 2) CAPS, Take 1 capsule by mouth 2 (two) times daily. , Disp: , Rfl:  .  ondansetron (ZOFRAN) 8 MG tablet, Take 1 tablet (8 mg total) by mouth 2 (two) times daily as needed (Nausea or vomiting)., Disp: 30 tablet,  Rfl: 1 .  polyethylene glycol (MIRALAX / GLYCOLAX) packet, Take 17 g daily by mouth., Disp: , Rfl:  .  prochlorperazine (COMPAZINE) 10 MG tablet, Take 1 tablet (10 mg total) by mouth every 6 (  six) hours as needed (Nausea or vomiting)., Disp: 30 tablet, Rfl: 1 .  simethicone (GAS-X) 80 MG chewable tablet, Chew 1 tablet (80 mg total) every 6 (six) hours as needed by mouth for flatulence., Disp: 30 tablet, Rfl: 0 .  simvastatin (ZOCOR) 20 MG tablet, Take 20 mg by mouth daily at 8 pm., Disp: , Rfl:  .  telmisartan (MICARDIS) 80 MG tablet, Take 40 mg by mouth daily with breakfast., Disp: , Rfl:  .  triamcinolone ointment (KENALOG) 0.5 %, Apply 1 application topically 2 (two) times daily., Disp: 30 g, Rfl: 0 No current facility-administered medications for this visit.   Facility-Administered Medications Ordered in Other Visits:  .  heparin lock flush 100 unit/mL, 500 Units, Intracatheter, Once PRN, Sindy Guadeloupe, MD .  trastuzumab (HERCEPTIN) 105 mg in sodium chloride 0.9 % 250 mL chemo infusion, 2 mg/kg (Treatment Plan Recorded), Intravenous, Once, Sindy Guadeloupe, MD  Physical exam:  Vitals:   03/06/17 0958  BP: (!) 143/79  Pulse: 81  Resp: 18  Temp: 97.9 F (36.6 C)  TempSrc: Tympanic  Weight: 126 lb (57.2 kg)  Height: '5\' 2"'  (1.575 m)   Physical Exam  Constitutional: She is oriented to person, place, and time and well-developed, well-nourished, and in no distress.  HENT:  Head: Normocephalic and atraumatic.  Eyes: EOM are normal. Pupils are equal, round, and reactive to light.  Neck: Normal range of motion.  Cardiovascular: Normal rate, regular rhythm and normal heart sounds.  Pulmonary/Chest: Effort normal and breath sounds normal.  Abdominal: Soft. Bowel sounds are normal.  Neurological: She is alert and oriented to person, place, and time.  Skin: Skin is warm and dry.  Macular erythematous circular skin lesions over bilateral hands appear better     CMP Latest Ref Rng & Units  03/06/2017  Glucose 65 - 99 mg/dL 137(H)  BUN 6 - 20 mg/dL 10  Creatinine 0.44 - 1.00 mg/dL 0.63  Sodium 135 - 145 mmol/L 134(L)  Potassium 3.5 - 5.1 mmol/L 3.8  Chloride 101 - 111 mmol/L 99(L)  CO2 22 - 32 mmol/L 27  Calcium 8.9 - 10.3 mg/dL 8.7(L)  Total Protein 6.5 - 8.1 g/dL 6.5  Total Bilirubin 0.3 - 1.2 mg/dL 0.5  Alkaline Phos 38 - 126 U/L 72  AST 15 - 41 U/L 26  ALT 14 - 54 U/L 21   CBC Latest Ref Rng & Units 03/06/2017  WBC 3.6 - 11.0 K/uL 7.5  Hemoglobin 12.0 - 16.0 g/dL 11.4(L)  Hematocrit 35.0 - 47.0 % 33.5(L)  Platelets 150 - 440 K/uL 290      Assessment and plan- Patient is a 75 y.o. female pathologicalprognostic stage IA pT1c1p N0 cM0invasive mammary carcinoma of the left breast ER negative PR positive and HER-2 +3 on IHC s/p lumpectomy here for on treatment assessment prior to cycle8of weekly  herceptin    Counts are okay to proceed with cycle #8 of weekly Herceptin today.  Patient has received 6 cycles of weekly Taxol so far and given her significant side effects plan is to discontinue Taxol.  I  I discussed that at this point options are to just continue with Herceptin which can be given every 3 weeks or trial of Abraxane instead of Taxol.  Patient is deriving maximum benefit from Herceptin given that she is HER-2 positive breast cancer but there are currently no studies comparing chemotherapy plus Herceptin versus Herceptin alone in the adjuvant setting.  Also her tumor is ER negative and weakly  PR positive and hence I would recommend another trial of chemotherapy.  Patient is agreeable to starting Abraxane next week with cycle 9 of treatment.  Discussed risks and benefits of Abraxane including all but not limited to nausea, vomiting, fatigue, low blood counts and risk of infusion reaction.  Patient understands and agrees to proceed as planned.    Patient will directly proceed for cycle #9 of Abraxane and Herceptin next week and I will see her back in 2 weeks time  for cycle #10 of Abraxane and Herceptin.  CBC and CMP each week.  Abraxane will be given at 100 mg/m square.     Visit Diagnosis 1. Malignant neoplasm of lower-inner quadrant of left breast in female, estrogen receptor positive ([place])   2. Encounter for monoclonal antibody treatment for malignancy      Dr. Randa Evens, MD, MPH [facility] Pager- [phone] 03/06/2017 10:45 AM

## 2017-03-06 NOTE — Progress Notes (Signed)
Rash comes and goes- pt could not tolerate the cream md ordered. She is using 1/2 pill of benadryl when needed. She is nervous about new abraxene today.

## 2017-03-06 NOTE — Progress Notes (Signed)
Spoke with MD. Cline Crock 100mg /m2 weekly will be added to herceptin for the last four treatments beginning 12/18.

## 2017-03-13 ENCOUNTER — Other Ambulatory Visit: Payer: Self-pay | Admitting: Oncology

## 2017-03-13 ENCOUNTER — Inpatient Hospital Stay: Payer: Medicare HMO

## 2017-03-13 VITALS — BP 135/81 | HR 73 | Temp 97.6°F | Resp 18

## 2017-03-13 DIAGNOSIS — I341 Nonrheumatic mitral (valve) prolapse: Secondary | ICD-10-CM | POA: Diagnosis not present

## 2017-03-13 DIAGNOSIS — R21 Rash and other nonspecific skin eruption: Secondary | ICD-10-CM | POA: Diagnosis not present

## 2017-03-13 DIAGNOSIS — Z170 Estrogen receptor positive status [ER+]: Principal | ICD-10-CM

## 2017-03-13 DIAGNOSIS — I499 Cardiac arrhythmia, unspecified: Secondary | ICD-10-CM | POA: Diagnosis not present

## 2017-03-13 DIAGNOSIS — F419 Anxiety disorder, unspecified: Secondary | ICD-10-CM | POA: Diagnosis not present

## 2017-03-13 DIAGNOSIS — Z171 Estrogen receptor negative status [ER-]: Secondary | ICD-10-CM | POA: Diagnosis not present

## 2017-03-13 DIAGNOSIS — C50312 Malignant neoplasm of lower-inner quadrant of left female breast: Secondary | ICD-10-CM

## 2017-03-13 DIAGNOSIS — Z5112 Encounter for antineoplastic immunotherapy: Secondary | ICD-10-CM | POA: Diagnosis not present

## 2017-03-13 DIAGNOSIS — D649 Anemia, unspecified: Secondary | ICD-10-CM | POA: Diagnosis not present

## 2017-03-13 DIAGNOSIS — Z5111 Encounter for antineoplastic chemotherapy: Secondary | ICD-10-CM | POA: Diagnosis not present

## 2017-03-13 LAB — CBC WITH DIFFERENTIAL/PLATELET
Basophils Absolute: 0.1 10*3/uL (ref 0–0.1)
Basophils Relative: 1 %
Eosinophils Absolute: 0.8 10*3/uL — ABNORMAL HIGH (ref 0–0.7)
Eosinophils Relative: 9 %
HCT: 34.2 % — ABNORMAL LOW (ref 35.0–47.0)
Hemoglobin: 11.5 g/dL — ABNORMAL LOW (ref 12.0–16.0)
Lymphocytes Relative: 23 %
Lymphs Abs: 1.9 10*3/uL (ref 1.0–3.6)
MCH: 30.3 pg (ref 26.0–34.0)
MCHC: 33.6 g/dL (ref 32.0–36.0)
MCV: 90 fL (ref 80.0–100.0)
Monocytes Absolute: 0.7 10*3/uL (ref 0.2–0.9)
Monocytes Relative: 8 %
Neutro Abs: 4.9 10*3/uL (ref 1.4–6.5)
Neutrophils Relative %: 59 %
Platelets: 244 10*3/uL (ref 150–440)
RBC: 3.8 MIL/uL (ref 3.80–5.20)
RDW: 14.7 % — ABNORMAL HIGH (ref 11.5–14.5)
WBC: 8.3 10*3/uL (ref 3.6–11.0)

## 2017-03-13 LAB — COMPREHENSIVE METABOLIC PANEL
ALT: 24 U/L (ref 14–54)
AST: 31 U/L (ref 15–41)
Albumin: 3.7 g/dL (ref 3.5–5.0)
Alkaline Phosphatase: 73 U/L (ref 38–126)
Anion gap: 8 (ref 5–15)
BUN: 10 mg/dL (ref 6–20)
CO2: 29 mmol/L (ref 22–32)
Calcium: 8.9 mg/dL (ref 8.9–10.3)
Chloride: 97 mmol/L — ABNORMAL LOW (ref 101–111)
Creatinine, Ser: 0.79 mg/dL (ref 0.44–1.00)
GFR calc Af Amer: >60 mL/min (ref >60)
GFR calc non Af Amer: >60 mL/min (ref >60)
Glucose, Bld: 110 mg/dL — ABNORMAL HIGH (ref 65–99)
Potassium: 3.6 mmol/L (ref 3.5–5.1)
Sodium: 134 mmol/L — ABNORMAL LOW (ref 135–145)
Total Bilirubin: 0.6 mg/dL (ref 0.3–1.2)
Total Protein: 6.6 g/dL (ref 6.5–8.1)

## 2017-03-13 MED ORDER — DEXAMETHASONE SODIUM PHOSPHATE 10 MG/ML IJ SOLN
10.0000 mg | Freq: Once | INTRAMUSCULAR | Status: AC
  Administered 2017-03-13: 10 mg via INTRAVENOUS
  Filled 2017-03-13: qty 1

## 2017-03-13 MED ORDER — PACLITAXEL PROTEIN-BOUND CHEMO INJECTION 100 MG
100.0000 mg/m2 | Freq: Once | INTRAVENOUS | Status: AC
  Administered 2017-03-13: 150 mg via INTRAVENOUS
  Filled 2017-03-13: qty 30

## 2017-03-13 MED ORDER — HEPARIN SOD (PORK) LOCK FLUSH 100 UNIT/ML IV SOLN
500.0000 [IU] | Freq: Once | INTRAVENOUS | Status: AC | PRN
  Administered 2017-03-13: 500 [IU]
  Filled 2017-03-13: qty 5

## 2017-03-13 MED ORDER — ACETAMINOPHEN 325 MG PO TABS
650.0000 mg | ORAL_TABLET | Freq: Once | ORAL | Status: AC
  Administered 2017-03-13: 650 mg via ORAL
  Filled 2017-03-13: qty 2

## 2017-03-13 MED ORDER — TRASTUZUMAB CHEMO 150 MG IV SOLR
2.0000 mg/kg | Freq: Once | INTRAVENOUS | Status: AC
  Administered 2017-03-13: 105 mg via INTRAVENOUS
  Filled 2017-03-13: qty 5

## 2017-03-13 MED ORDER — SODIUM CHLORIDE 0.9 % IV SOLN
Freq: Once | INTRAVENOUS | Status: AC
  Administered 2017-03-13: 14:00:00 via INTRAVENOUS
  Filled 2017-03-13: qty 1000

## 2017-03-22 ENCOUNTER — Inpatient Hospital Stay: Payer: Medicare HMO

## 2017-03-22 ENCOUNTER — Inpatient Hospital Stay (HOSPITAL_BASED_OUTPATIENT_CLINIC_OR_DEPARTMENT_OTHER): Payer: Medicare HMO | Admitting: Oncology

## 2017-03-22 ENCOUNTER — Encounter: Payer: Self-pay | Admitting: Oncology

## 2017-03-22 VITALS — BP 137/76 | HR 68 | Temp 97.8°F | Resp 16 | Wt 125.0 lb

## 2017-03-22 DIAGNOSIS — K219 Gastro-esophageal reflux disease without esophagitis: Secondary | ICD-10-CM

## 2017-03-22 DIAGNOSIS — I499 Cardiac arrhythmia, unspecified: Secondary | ICD-10-CM

## 2017-03-22 DIAGNOSIS — Z8669 Personal history of other diseases of the nervous system and sense organs: Secondary | ICD-10-CM

## 2017-03-22 DIAGNOSIS — Z5112 Encounter for antineoplastic immunotherapy: Secondary | ICD-10-CM

## 2017-03-22 DIAGNOSIS — Z170 Estrogen receptor positive status [ER+]: Principal | ICD-10-CM

## 2017-03-22 DIAGNOSIS — D649 Anemia, unspecified: Secondary | ICD-10-CM

## 2017-03-22 DIAGNOSIS — C50312 Malignant neoplasm of lower-inner quadrant of left female breast: Secondary | ICD-10-CM

## 2017-03-22 DIAGNOSIS — I341 Nonrheumatic mitral (valve) prolapse: Secondary | ICD-10-CM | POA: Diagnosis not present

## 2017-03-22 DIAGNOSIS — R21 Rash and other nonspecific skin eruption: Secondary | ICD-10-CM

## 2017-03-22 DIAGNOSIS — E7800 Pure hypercholesterolemia, unspecified: Secondary | ICD-10-CM

## 2017-03-22 DIAGNOSIS — Z5111 Encounter for antineoplastic chemotherapy: Secondary | ICD-10-CM | POA: Diagnosis not present

## 2017-03-22 DIAGNOSIS — Z86010 Personal history of colonic polyps: Secondary | ICD-10-CM

## 2017-03-22 DIAGNOSIS — F419 Anxiety disorder, unspecified: Secondary | ICD-10-CM

## 2017-03-22 DIAGNOSIS — Z8619 Personal history of other infectious and parasitic diseases: Secondary | ICD-10-CM

## 2017-03-22 DIAGNOSIS — Z79899 Other long term (current) drug therapy: Secondary | ICD-10-CM

## 2017-03-22 DIAGNOSIS — Z87891 Personal history of nicotine dependence: Secondary | ICD-10-CM

## 2017-03-22 DIAGNOSIS — K449 Diaphragmatic hernia without obstruction or gangrene: Secondary | ICD-10-CM | POA: Diagnosis not present

## 2017-03-22 DIAGNOSIS — Z8673 Personal history of transient ischemic attack (TIA), and cerebral infarction without residual deficits: Secondary | ICD-10-CM

## 2017-03-22 DIAGNOSIS — Z171 Estrogen receptor negative status [ER-]: Secondary | ICD-10-CM | POA: Diagnosis not present

## 2017-03-22 DIAGNOSIS — Z800 Family history of malignant neoplasm of digestive organs: Secondary | ICD-10-CM

## 2017-03-22 DIAGNOSIS — Z9011 Acquired absence of right breast and nipple: Secondary | ICD-10-CM

## 2017-03-22 DIAGNOSIS — I10 Essential (primary) hypertension: Secondary | ICD-10-CM

## 2017-03-22 DIAGNOSIS — M810 Age-related osteoporosis without current pathological fracture: Secondary | ICD-10-CM

## 2017-03-22 DIAGNOSIS — N3090 Cystitis, unspecified without hematuria: Secondary | ICD-10-CM

## 2017-03-22 LAB — COMPREHENSIVE METABOLIC PANEL
ALT: 25 U/L (ref 14–54)
AST: 30 U/L (ref 15–41)
Albumin: 3.6 g/dL (ref 3.5–5.0)
Alkaline Phosphatase: 69 U/L (ref 38–126)
Anion gap: 7 (ref 5–15)
BUN: 11 mg/dL (ref 6–20)
CO2: 28 mmol/L (ref 22–32)
Calcium: 8.9 mg/dL (ref 8.9–10.3)
Chloride: 99 mmol/L — ABNORMAL LOW (ref 101–111)
Creatinine, Ser: 0.59 mg/dL (ref 0.44–1.00)
GFR calc Af Amer: >60 mL/min (ref >60)
GFR calc non Af Amer: >60 mL/min (ref >60)
Glucose, Bld: 134 mg/dL — ABNORMAL HIGH (ref 65–99)
Potassium: 3.9 mmol/L (ref 3.5–5.1)
Sodium: 134 mmol/L — ABNORMAL LOW (ref 135–145)
Total Bilirubin: 0.5 mg/dL (ref 0.3–1.2)
Total Protein: 6.5 g/dL (ref 6.5–8.1)

## 2017-03-22 LAB — CBC WITH DIFFERENTIAL/PLATELET
Basophils Absolute: 0 10*3/uL (ref 0–0.1)
Basophils Relative: 1 %
Eosinophils Absolute: 0.4 10*3/uL (ref 0–0.7)
Eosinophils Relative: 8 %
HCT: 31.7 % — ABNORMAL LOW (ref 35.0–47.0)
Hemoglobin: 10.6 g/dL — ABNORMAL LOW (ref 12.0–16.0)
Lymphocytes Relative: 24 %
Lymphs Abs: 1.1 10*3/uL (ref 1.0–3.6)
MCH: 30 pg (ref 26.0–34.0)
MCHC: 33.6 g/dL (ref 32.0–36.0)
MCV: 89.2 fL (ref 80.0–100.0)
Monocytes Absolute: 0.4 10*3/uL (ref 0.2–0.9)
Monocytes Relative: 8 %
Neutro Abs: 2.8 10*3/uL (ref 1.4–6.5)
Neutrophils Relative %: 59 %
Platelets: 249 10*3/uL (ref 150–440)
RBC: 3.55 MIL/uL — ABNORMAL LOW (ref 3.80–5.20)
RDW: 14.8 % — ABNORMAL HIGH (ref 11.5–14.5)
WBC: 4.7 10*3/uL (ref 3.6–11.0)

## 2017-03-22 MED ORDER — PACLITAXEL PROTEIN-BOUND CHEMO INJECTION 100 MG
100.0000 mg/m2 | Freq: Once | INTRAVENOUS | Status: AC
  Administered 2017-03-22: 150 mg via INTRAVENOUS
  Filled 2017-03-22: qty 30

## 2017-03-22 MED ORDER — HEPARIN SOD (PORK) LOCK FLUSH 100 UNIT/ML IV SOLN
500.0000 [IU] | Freq: Once | INTRAVENOUS | Status: AC | PRN
  Administered 2017-03-22: 500 [IU]
  Filled 2017-03-22 (×2): qty 5

## 2017-03-22 MED ORDER — SODIUM CHLORIDE 0.9 % IV SOLN
Freq: Once | INTRAVENOUS | Status: AC
  Administered 2017-03-22: 11:00:00 via INTRAVENOUS
  Filled 2017-03-22: qty 1000

## 2017-03-22 MED ORDER — DEXAMETHASONE SODIUM PHOSPHATE 10 MG/ML IJ SOLN
10.0000 mg | Freq: Once | INTRAMUSCULAR | Status: AC
  Administered 2017-03-22: 10 mg via INTRAVENOUS
  Filled 2017-03-22: qty 1

## 2017-03-22 MED ORDER — DEXAMETHASONE SODIUM PHOSPHATE 4 MG/ML IJ SOLN
10.0000 mg | Freq: Once | INTRAMUSCULAR | Status: DC
  Filled 2017-03-22: qty 3

## 2017-03-22 MED ORDER — ACETAMINOPHEN 325 MG PO TABS
650.0000 mg | ORAL_TABLET | Freq: Once | ORAL | Status: AC
  Administered 2017-03-22: 650 mg via ORAL
  Filled 2017-03-22: qty 2

## 2017-03-22 MED ORDER — TRASTUZUMAB CHEMO 150 MG IV SOLR
2.0000 mg/kg | Freq: Once | INTRAVENOUS | Status: AC
  Administered 2017-03-22: 105 mg via INTRAVENOUS
  Filled 2017-03-22: qty 5

## 2017-03-22 MED ORDER — SODIUM CHLORIDE 0.9% FLUSH
10.0000 mL | INTRAVENOUS | Status: DC | PRN
  Filled 2017-03-22: qty 10

## 2017-03-22 NOTE — Progress Notes (Signed)
Hematology/Oncology Consult note [facility]  [phone] [phone]  Patient Care Team: Rusty Aus, MD as PCP - General (Internal Medicine)   Name of the patient: Doris Barnes  [id number]  09-25-1940   Date of visit: 03/22/17  Diagnosis- Stage IA invasive mammary carcinoma of the left breast ER negative, PR weakly positive and HER-2 positive  Chief complaint/ Reason for visit- on treatment assessment prior to cycle 10 of herceptin and cycle 2 of weekly abraxane  Heme/Onc history:1. Patient is a 76 year old female who underwent bilateral screening mammogram on 11/28/2016 which showed a possible mass and calcifications in her left breast. This was followed by diagnostic mammogram and an ultrasound of the left breast which showed: 1.4 x 1.2 x 1.3 cm irregular mass in the left breast at the 6:30 position 3 cm from the nipple. On magnification views the overall extent of the mass and calcifications measures 1.9 cm. Left axilla was evaluated with ultrasound and showed no enlarged and morphologically abnormal lymph nodes.   2. She underwent core biopsy of the left breast mass which showed: Invasive mammary carcinoma, grade 3. No DCIS or lymphovascular invasion. ER was negative and PR was 11-50% positive and HER-2 was +3 on IHC  3Menarche at the age of 40 years. G5 P5 L5. She did have twoprior biopsiesof her left breast which did not reveal any malignancy. No family history of breast or ovarian cancer. Her sister and maternal grandfather had colon cancer. She is considerably anxious today at the thought of getting chemotherapy. She does have a history of mitral valve prolapse and states that she has "irregular heartbeat"for which she is on atenolol. She had a Holter monitor in the past but was not found to have any arrhythmia.Baseline MUGA scan showed a normal EF of 64.5%  4. Patient underwent lumpectomy on 01/01/2017 which showed invasive  mammary carcinoma, size of tumor was 14 mm, grade 3 with negative margins. 0 out of one lymph nodes was positive for malignancy. There was associated DCIS. Lymphovascular invasion was negative. ER negative, PR 11-50% positive and HER-2/neu +3  5. Adjuvant taxol herceptin chemo started on 01/16/17.  Patient had significant fatigue abdominal bloating and Cramping as well as nosebleeds from Taxol and did not wish to continue with Taxol.  She received 5 cycles of weekly Taxol.    Interval history-patient tolerated her last dose of Abraxane very well and reports no side effects from it.  Has occasional cough when she takes a deep breath but denies other complaints  ECOG PS- 0 Pain scale- 0   Review of systems- Review of Systems  Constitutional: Negative for chills, fever, malaise/fatigue and weight loss.  HENT: Negative for congestion, ear discharge and nosebleeds.   Eyes: Negative for blurred vision.  Respiratory: Positive for cough. Negative for hemoptysis, sputum production, shortness of breath and wheezing.   Cardiovascular: Negative for chest pain, palpitations, orthopnea and claudication.  Gastrointestinal: Negative for abdominal pain, blood in stool, constipation, diarrhea, heartburn, melena, nausea and vomiting.  Genitourinary: Negative for dysuria, flank pain, frequency, hematuria and urgency.  Musculoskeletal: Negative for back pain, joint pain and myalgias.  Skin: Negative for rash.  Neurological: Negative for dizziness, tingling, focal weakness, seizures, weakness and headaches.  Endo/Heme/Allergies: Does not bruise/bleed easily.  Psychiatric/Behavioral: Negative for depression and suicidal ideas. The patient does not have insomnia.       Allergies  Allergen Reactions  . Imipramine Pamoate Shortness Of Breath  . Bisphosphonates Nausea Only  .  Epinephrine Other (See Comments)    Difficulty breathing  . Prednisone Palpitations  . Sulfa Antibiotics Rash    Face turns red  and stings     Past Medical History:  Diagnosis Date  . Anemia   . Anxiety   . Asthma   . Breast cancer ([place])   . Cancer ([place]) skin  . Chronic vulvitis   . Colon polyp   . Cystocele   . Diverticulitis   . Diverticulitis   . Diverticulosis   . Diverticulosis   . Dyspareunia, female   . Dyspnea   . Dysrhythmia   . Fibrocystic disease of both breasts   . Gastritis   . GERD (gastroesophageal reflux disease)    gastritis also  . Hemorrhoids   . History of hiatal hernia   . Hypercholesteremia   . Hypertension   . IBS (irritable bowel syndrome)   . IC (interstitial cystitis)   . Migraine   . Mitral valve prolapse   . OP (osteoporosis)   . Positional vertigo   . Rectocele   . Squamous cell carcinoma   . Stroke [facility])    TIA  . Vaginal atrophy      Past Surgical History:  Procedure Laterality Date  . APPENDECTOMY    . BREAST BIOPSY Left 1998   core bx- neg  . BREAST BIOPSY Left 2007   neg  . BREAST BIOPSY Left 12/14/2016   left breast US core path pending  . BREAST EXCISIONAL BIOPSY Left   . BREAST LUMPECTOMY Left 01/01/2017    INVASIVE MAMMARY CARCINOMA/Grade 3  . CARDIAC CATHETERIZATION N/A 01/18/2015   Procedure: Left Heart Cath and Coronary Angiography;  Surgeon: Teodoro Spray, MD;  Location: [place] [facility];  Service: Cardiovascular;  Laterality: N/A;  . CATARACT EXTRACTION W/PHACO Right 12/15/2015   Procedure: CATARACT EXTRACTION PHACO AND INTRAOCULAR LENS PLACEMENT (IOC);  Surgeon: Estill Cotta, MD;  Location: [facility] OR[facility];  Service: Ophthalmology;  Laterality: Right;  [place] 01:20 AP% 23.9 CDE 35.49 Fluid pack lot # [id number]  . CATARACT EXTRACTION W/PHACO Left 12/29/2015   Procedure: CATARACT EXTRACTION PHACO AND INTRAOCULAR LENS PLACEMENT ([place]);  Surgeon: Estill Cotta, MD;  Location: [facility] OR[facility];  Service: Ophthalmology;  Laterality: Left;  [place]  01:20 AP% 25.1 CDE 32.86 Fluid pack lot # [id number]  . COLON SURGERY    . COLONOSCOPY WITH PROPOFOL N/A  02/21/2016   Procedure: COLONOSCOPY WITH PROPOFOL;  Surgeon: Manya Silvas, MD;  Location[facility];  Service: Endoscopy;  Laterality: N/A;  . CORONARY ANGIOPLASTY    . DILATION AND CURETTAGE OF UTERUS    . OOPHORECTOMY Bilateral   . PARTIAL MASTECTOMY WITH NEEDLE LOCALIZATION Left 01/01/2017   Procedure: PARTIAL MASTECTOMY WITH NEEDLE LOCALIZATION;  Surgeon: Herbert Pun, MD;  Location: [facility] ORS;  Service: General;  Laterality: Left;  . PORTACATH PLACEMENT Right 01/01/2017   Procedure: INSERTION PORT-A-CATH;  Surgeon: Herbert Pun, MD;  Location: [facility] OR[facility];  Service: General;  Laterality: Right;  . SENTINEL NODE BIOPSY Left 01/01/2017   Procedure: SENTINEL NODE BIOPSY;  Surgeon: Herbert Pun, MD;  Location: [facility] OR[facility];  Service: General;  Laterality: Left;  . TONSILLECTOMY    . VAGINAL HYSTERECTOMY      Social History   Socioeconomic History  . Marital status: Married    Spouse name: Not on file  . Number of children: Not on file  . Years of education: Not on file  . Highest education level: Not on file  Social Needs  . Financial  resource strain: Not on file  . Food insecurity - worry: Not on file  . Food insecurity - inability: Not on file  . Transportation needs - medical: Not on file  . Transportation needs - non-medical: Not on file  Occupational History  . Not on file  Tobacco Use  . Smoking status: Former Smoker    Packs/day: 1.00    Years: 33.00    Pack years: 33.00    Last attempt to quit: 10/17/1987    Years since quitting: 29.4  . Smokeless tobacco: Never Used  Substance and Sexual Activity  . Alcohol use: No  . Drug use: No  . Sexual activity: Yes  Other Topics Concern  . Not on file  Social History Narrative  . Not on file    Family History  Problem Relation Age of Onset  . Diabetes Mother   . Diabetes Father   . Colon cancer Sister   . Diabetes Sister   . Diabetes Maternal Aunt   . Colon cancer Paternal Grandfather     . Lung cancer Brother   . Breast cancer Neg Hx   . Ovarian cancer Neg Hx      Current Outpatient Medications:  .  acetaminophen (TYLENOL) 325 MG tablet, Take 650 mg by mouth every 6 (six) hours as needed (for headaches.). , Disp: , Rfl:  .  ALPRAZolam (XANAX) 0.25 MG tablet, Take 0.125-0.25 mg by mouth 2 (two) times daily as needed for anxiety or sleep. , Disp: , Rfl:  .  aspirin EC 325 MG tablet, Take 325 mg by mouth daily with breakfast. , Disp: , Rfl:  .  atenolol (TENORMIN) 50 MG tablet, Take 50 mg by mouth daily with breakfast. , Disp: , Rfl:  .  calcium citrate-vitamin D (CITRACAL+D) 315-200 MG-UNIT tablet, Take 1 tablet by mouth 2 (two) times daily., Disp: , Rfl:  .  dicyclomine (BENTYL) 20 MG tablet, Take 1 tablet (20 mg total) by mouth 3 (three) times daily as needed for spasms., Disp: 30 tablet, Rfl: 0 .  diphenhydrAMINE (BENADRYL) 25 MG tablet, Take 12.5 mg by mouth every 6 (six) hours as needed for itching (from rash)., Disp: , Rfl:  .  FLUoxetine (PROZAC) 40 MG capsule, Take 40 mg by mouth at bedtime., Disp: , Rfl:  .  fluticasone (FLONASE) 50 MCG/ACT nasal spray, Place 2 sprays into both nostrils daily as needed for allergies. , Disp: , Rfl:  .  hydrochlorothiazide (HYDRODIURIL) 25 MG tablet, Take 25 mg by mouth daily. , Disp: , Rfl:  .  lansoprazole (PREVACID) 30 MG capsule, Take 30 mg by mouth daily before breakfast. , Disp: , Rfl:  .  lidocaine-prilocaine (EMLA) cream, Apply to affected area once, Disp: 30 g, Rfl: 3 .  loratadine (CLARITIN) 10 MG tablet, Take 10 mg by mouth daily as needed for allergies. , Disp: , Rfl:  .  Magnesium 250 MG TABS, Take 250 mg by mouth daily., Disp: , Rfl:  .  Multiple Vitamins-Minerals (PRESERVISION AREDS 2) CAPS, Take 1 capsule by mouth 2 (two) times daily. , Disp: , Rfl:  .  ondansetron (ZOFRAN) 8 MG tablet, Take 1 tablet (8 mg total) by mouth 2 (two) times daily as needed (Nausea or vomiting)., Disp: 30 tablet, Rfl: 1 .  polyethylene glycol  (MIRALAX / GLYCOLAX) packet, Take 17 g daily by mouth., Disp: , Rfl:  .  prochlorperazine (COMPAZINE) 10 MG tablet, Take 1 tablet (10 mg total) by mouth every 6 (six) hours as needed (  Nausea or vomiting)., Disp: 30 tablet, Rfl: 1 .  simethicone (GAS-X) 80 MG chewable tablet, Chew 1 tablet (80 mg total) every 6 (six) hours as needed by mouth for flatulence., Disp: 30 tablet, Rfl: 0 .  simvastatin (ZOCOR) 20 MG tablet, Take 20 mg by mouth daily at 8 pm., Disp: , Rfl:  .  telmisartan (MICARDIS) 80 MG tablet, Take 40 mg by mouth daily with breakfast., Disp: , Rfl:  .  triamcinolone ointment (KENALOG) 0.5 %, Apply 1 application topically 2 (two) times daily., Disp: 30 g, Rfl: 0 No current facility-administered medications for this visit.   Facility-Administered Medications Ordered in Other Visits:  .  heparin lock flush 100 unit/mL, 500 Units, Intracatheter, Once PRN, Sindy Guadeloupe, MD .  PACLitaxel-protein bound (ABRAXANE) chemo infusion 150 mg, 100 mg/m2 (Treatment Plan Recorded), Intravenous, Once, Sindy Guadeloupe, MD .  sodium chloride flush (NS) 0.9 % injection 10 mL, 10 mL, Intracatheter, PRN, Sindy Guadeloupe, MD .  trastuzumab (HERCEPTIN) 105 mg in sodium chloride 0.9 % 250 mL chemo infusion, 2 mg/kg (Treatment Plan Recorded), Intravenous, Once, Sindy Guadeloupe, MD  Physical exam:  Vitals:   03/22/17 1030  BP: 137/76  Pulse: 68  Resp: 16  Temp: 97.8 F (36.6 C)  TempSrc: Tympanic  Weight: 125 lb (56.7 kg)   Physical Exam  Constitutional: She is oriented to person, place, and time and well-developed, well-nourished, and in no distress.  HENT:  Head: Normocephalic and atraumatic.  Eyes: EOM are normal. Pupils are equal, round, and reactive to light.  Neck: Normal range of motion.  Cardiovascular: Normal rate, regular rhythm and normal heart sounds.  Pulmonary/Chest: Effort normal and breath sounds normal.  Abdominal: Soft. Bowel sounds are normal.  Neurological: She is alert and  oriented to person, place, and time.  Skin: Skin is warm and dry.     CMP Latest Ref Rng & Units 03/22/2017  Glucose 65 - 99 mg/dL 134(H)  BUN 6 - 20 mg/dL 11  Creatinine 0.44 - 1.00 mg/dL 0.59  Sodium 135 - 145 mmol/L 134(L)  Potassium 3.5 - 5.1 mmol/L 3.9  Chloride 101 - 111 mmol/L 99(L)  CO2 22 - 32 mmol/L 28  Calcium 8.9 - 10.3 mg/dL 8.9  Total Protein 6.5 - 8.1 g/dL 6.5  Total Bilirubin 0.3 - 1.2 mg/dL 0.5  Alkaline Phos 38 - 126 U/L 69  AST 15 - 41 U/L 30  ALT 14 - 54 U/L 25   CBC Latest Ref Rng & Units 03/22/2017  WBC 3.6 - 11.0 K/uL 4.7  Hemoglobin 12.0 - 16.0 g/dL 10.6(L)  Hematocrit 35.0 - 47.0 % 31.7(L)  Platelets 150 - 440 K/uL 249      Assessment and plan- Patient is a 76 y.o. female pathologicalprognostic stage IA pT1c1p N0 cM0invasive mammary carcinoma of the left breast ER negative PR positive and HER-2 +3 on IHC s/p lumpectomy here for on treatment assessment prior to cycle10of weekly  herceptin and cycle 2 of weekly Abraxane  Patient received 5 cycles of weekly Taxol along with Herceptin.  She could not tolerate it well therefore chemotherapy was held for 3 weeks and did not wish to continue with chemotherapy at that time. Trial of Abraxane was given for cycle #9 of treatment which she tolerated very well.  She will therefore proceed with cycle #10 of weekly Abraxane and Herceptin today.  She will directly proceed with labs and treatment next week for cycle 11 and I will see her back in  12 weeks time for cycle #12 of Abraxane and Herceptin.  Patient will continue Herceptin every 3 weeks for a total duration of 1 year after she completes chemo.  I will refer her to radiation oncology at this time for consideration of adjuvant radiation after completing 12 cycles of chemotherapy and Herceptin.  Patient has a ER negative and a weakly PR positive tumor and I do not think that she will derive significant benefit from hormone therapy.  I will discuss this with her in  greater detail in 2 weeks time.  We will repeat MUGA scan next month    Visit Diagnosis 1. Malignant neoplasm of lower-inner quadrant of left breast in female, estrogen receptor positive ([place])   2. Encounter for monoclonal antibody treatment for malignancy      Dr. Randa Evens, MD, MPH [facility] Pager- [phone] 03/22/2017 11:54 AM

## 2017-03-26 ENCOUNTER — Ambulatory Visit: Payer: Medicare HMO

## 2017-03-30 ENCOUNTER — Inpatient Hospital Stay: Payer: Medicare HMO

## 2017-03-30 ENCOUNTER — Inpatient Hospital Stay: Payer: Medicare HMO | Attending: Oncology

## 2017-03-30 VITALS — BP 136/82 | HR 73 | Temp 95.7°F | Resp 20

## 2017-03-30 DIAGNOSIS — E7800 Pure hypercholesterolemia, unspecified: Secondary | ICD-10-CM | POA: Diagnosis not present

## 2017-03-30 DIAGNOSIS — R109 Unspecified abdominal pain: Secondary | ICD-10-CM | POA: Diagnosis not present

## 2017-03-30 DIAGNOSIS — Z800 Family history of malignant neoplasm of digestive organs: Secondary | ICD-10-CM | POA: Insufficient documentation

## 2017-03-30 DIAGNOSIS — R197 Diarrhea, unspecified: Secondary | ICD-10-CM | POA: Insufficient documentation

## 2017-03-30 DIAGNOSIS — K219 Gastro-esophageal reflux disease without esophagitis: Secondary | ICD-10-CM | POA: Insufficient documentation

## 2017-03-30 DIAGNOSIS — Z9012 Acquired absence of left breast and nipple: Secondary | ICD-10-CM | POA: Insufficient documentation

## 2017-03-30 DIAGNOSIS — N941 Unspecified dyspareunia: Secondary | ICD-10-CM | POA: Diagnosis not present

## 2017-03-30 DIAGNOSIS — I10 Essential (primary) hypertension: Secondary | ICD-10-CM | POA: Insufficient documentation

## 2017-03-30 DIAGNOSIS — C50312 Malignant neoplasm of lower-inner quadrant of left female breast: Secondary | ICD-10-CM | POA: Insufficient documentation

## 2017-03-30 DIAGNOSIS — K580 Irritable bowel syndrome with diarrhea: Secondary | ICD-10-CM | POA: Insufficient documentation

## 2017-03-30 DIAGNOSIS — Z5112 Encounter for antineoplastic immunotherapy: Secondary | ICD-10-CM | POA: Insufficient documentation

## 2017-03-30 DIAGNOSIS — F419 Anxiety disorder, unspecified: Secondary | ICD-10-CM | POA: Insufficient documentation

## 2017-03-30 DIAGNOSIS — Z90710 Acquired absence of both cervix and uterus: Secondary | ICD-10-CM | POA: Insufficient documentation

## 2017-03-30 DIAGNOSIS — R5383 Other fatigue: Secondary | ICD-10-CM | POA: Insufficient documentation

## 2017-03-30 DIAGNOSIS — Z8673 Personal history of transient ischemic attack (TIA), and cerebral infarction without residual deficits: Secondary | ICD-10-CM | POA: Diagnosis not present

## 2017-03-30 DIAGNOSIS — D701 Agranulocytosis secondary to cancer chemotherapy: Secondary | ICD-10-CM | POA: Insufficient documentation

## 2017-03-30 DIAGNOSIS — Z8719 Personal history of other diseases of the digestive system: Secondary | ICD-10-CM | POA: Insufficient documentation

## 2017-03-30 DIAGNOSIS — R0602 Shortness of breath: Secondary | ICD-10-CM | POA: Diagnosis not present

## 2017-03-30 DIAGNOSIS — Z171 Estrogen receptor negative status [ER-]: Secondary | ICD-10-CM | POA: Diagnosis not present

## 2017-03-30 DIAGNOSIS — R531 Weakness: Secondary | ICD-10-CM | POA: Diagnosis not present

## 2017-03-30 DIAGNOSIS — Z87891 Personal history of nicotine dependence: Secondary | ICD-10-CM | POA: Insufficient documentation

## 2017-03-30 DIAGNOSIS — Z5111 Encounter for antineoplastic chemotherapy: Secondary | ICD-10-CM | POA: Insufficient documentation

## 2017-03-30 DIAGNOSIS — K449 Diaphragmatic hernia without obstruction or gangrene: Secondary | ICD-10-CM | POA: Diagnosis not present

## 2017-03-30 DIAGNOSIS — K589 Irritable bowel syndrome without diarrhea: Secondary | ICD-10-CM | POA: Insufficient documentation

## 2017-03-30 DIAGNOSIS — Z9049 Acquired absence of other specified parts of digestive tract: Secondary | ICD-10-CM | POA: Insufficient documentation

## 2017-03-30 DIAGNOSIS — J45909 Unspecified asthma, uncomplicated: Secondary | ICD-10-CM | POA: Diagnosis not present

## 2017-03-30 DIAGNOSIS — N762 Acute vulvitis: Secondary | ICD-10-CM | POA: Diagnosis not present

## 2017-03-30 DIAGNOSIS — Z170 Estrogen receptor positive status [ER+]: Principal | ICD-10-CM

## 2017-03-30 DIAGNOSIS — R42 Dizziness and giddiness: Secondary | ICD-10-CM | POA: Diagnosis not present

## 2017-03-30 DIAGNOSIS — I341 Nonrheumatic mitral (valve) prolapse: Secondary | ICD-10-CM | POA: Diagnosis not present

## 2017-03-30 LAB — CBC WITH DIFFERENTIAL/PLATELET
Basophils Absolute: 0 10*3/uL (ref 0–0.1)
Basophils Relative: 1 %
Eosinophils Absolute: 0.1 10*3/uL (ref 0–0.7)
Eosinophils Relative: 5 %
HCT: 30.2 % — ABNORMAL LOW (ref 35.0–47.0)
Hemoglobin: 10.2 g/dL — ABNORMAL LOW (ref 12.0–16.0)
Lymphocytes Relative: 33 %
Lymphs Abs: 0.9 10*3/uL — ABNORMAL LOW (ref 1.0–3.6)
MCH: 30.3 pg (ref 26.0–34.0)
MCHC: 33.9 g/dL (ref 32.0–36.0)
MCV: 89.4 fL (ref 80.0–100.0)
Monocytes Absolute: 0.2 10*3/uL (ref 0.2–0.9)
Monocytes Relative: 7 %
Neutro Abs: 1.4 10*3/uL (ref 1.4–6.5)
Neutrophils Relative %: 54 %
Platelets: 223 10*3/uL (ref 150–440)
RBC: 3.37 MIL/uL — ABNORMAL LOW (ref 3.80–5.20)
RDW: 14.7 % — ABNORMAL HIGH (ref 11.5–14.5)
WBC: 2.7 10*3/uL — ABNORMAL LOW (ref 3.6–11.0)

## 2017-03-30 LAB — COMPREHENSIVE METABOLIC PANEL
ALT: 27 U/L (ref 14–54)
AST: 30 U/L (ref 15–41)
Albumin: 3.5 g/dL (ref 3.5–5.0)
Alkaline Phosphatase: 63 U/L (ref 38–126)
Anion gap: 7 (ref 5–15)
BUN: 9 mg/dL (ref 6–20)
CO2: 29 mmol/L (ref 22–32)
Calcium: 8.7 mg/dL — ABNORMAL LOW (ref 8.9–10.3)
Chloride: 97 mmol/L — ABNORMAL LOW (ref 101–111)
Creatinine, Ser: 0.63 mg/dL (ref 0.44–1.00)
GFR calc Af Amer: >60 mL/min (ref >60)
GFR calc non Af Amer: >60 mL/min (ref >60)
Glucose, Bld: 141 mg/dL — ABNORMAL HIGH (ref 65–99)
Potassium: 3.6 mmol/L (ref 3.5–5.1)
Sodium: 133 mmol/L — ABNORMAL LOW (ref 135–145)
Total Bilirubin: 0.5 mg/dL (ref 0.3–1.2)
Total Protein: 6.1 g/dL — ABNORMAL LOW (ref 6.5–8.1)

## 2017-03-30 MED ORDER — DEXAMETHASONE SODIUM PHOSPHATE 4 MG/ML IJ SOLN
10.0000 mg | Freq: Once | INTRAMUSCULAR | Status: DC
  Filled 2017-03-30: qty 3

## 2017-03-30 MED ORDER — ACETAMINOPHEN 325 MG PO TABS
650.0000 mg | ORAL_TABLET | Freq: Once | ORAL | Status: AC
  Administered 2017-03-30: 650 mg via ORAL
  Filled 2017-03-30: qty 2

## 2017-03-30 MED ORDER — PACLITAXEL PROTEIN-BOUND CHEMO INJECTION 100 MG
100.0000 mg/m2 | Freq: Once | INTRAVENOUS | Status: AC
  Administered 2017-03-30: 150 mg via INTRAVENOUS
  Filled 2017-03-30: qty 30

## 2017-03-30 MED ORDER — TRASTUZUMAB CHEMO 150 MG IV SOLR
2.0000 mg/kg | Freq: Once | INTRAVENOUS | Status: AC
  Administered 2017-03-30: 105 mg via INTRAVENOUS
  Filled 2017-03-30: qty 5

## 2017-03-30 MED ORDER — HEPARIN SOD (PORK) LOCK FLUSH 100 UNIT/ML IV SOLN
500.0000 [IU] | Freq: Once | INTRAVENOUS | Status: AC
  Administered 2017-03-30: 500 [IU] via INTRAVENOUS
  Filled 2017-03-30: qty 5

## 2017-03-30 MED ORDER — DEXAMETHASONE SODIUM PHOSPHATE 10 MG/ML IJ SOLN
10.0000 mg | Freq: Once | INTRAMUSCULAR | Status: AC
  Administered 2017-03-30: 10 mg via INTRAVENOUS
  Filled 2017-03-30: qty 1

## 2017-03-30 MED ORDER — SODIUM CHLORIDE 0.9% FLUSH
10.0000 mL | INTRAVENOUS | Status: DC | PRN
  Administered 2017-03-30: 10 mL via INTRAVENOUS
  Filled 2017-03-30: qty 10

## 2017-03-30 MED ORDER — SODIUM CHLORIDE 0.9 % IV SOLN
Freq: Once | INTRAVENOUS | Status: AC
  Administered 2017-03-30: 09:00:00 via INTRAVENOUS
  Filled 2017-03-30: qty 1000

## 2017-04-02 ENCOUNTER — Other Ambulatory Visit: Payer: Self-pay

## 2017-04-02 ENCOUNTER — Encounter: Payer: Self-pay | Admitting: Radiation Oncology

## 2017-04-02 ENCOUNTER — Ambulatory Visit
Admission: RE | Admit: 2017-04-02 | Discharge: 2017-04-02 | Disposition: A | Discharge status: Home / Self Care | Payer: Medicare HMO | Source: Ambulatory Visit | Attending: Radiation Oncology | Admitting: Radiation Oncology

## 2017-04-02 VITALS — BP 115/66 | HR 78 | Temp 97.7°F | Resp 20 | Wt 124.9 lb

## 2017-04-02 DIAGNOSIS — I341 Nonrheumatic mitral (valve) prolapse: Secondary | ICD-10-CM | POA: Diagnosis not present

## 2017-04-02 DIAGNOSIS — Z79899 Other long term (current) drug therapy: Secondary | ICD-10-CM | POA: Insufficient documentation

## 2017-04-02 DIAGNOSIS — Z8719 Personal history of other diseases of the digestive system: Secondary | ICD-10-CM | POA: Insufficient documentation

## 2017-04-02 DIAGNOSIS — Z7982 Long term (current) use of aspirin: Secondary | ICD-10-CM | POA: Diagnosis not present

## 2017-04-02 DIAGNOSIS — Z171 Estrogen receptor negative status [ER-]: Secondary | ICD-10-CM | POA: Diagnosis not present

## 2017-04-02 DIAGNOSIS — Z800 Family history of malignant neoplasm of digestive organs: Secondary | ICD-10-CM | POA: Insufficient documentation

## 2017-04-02 DIAGNOSIS — J45909 Unspecified asthma, uncomplicated: Secondary | ICD-10-CM | POA: Insufficient documentation

## 2017-04-02 DIAGNOSIS — Z9221 Personal history of antineoplastic chemotherapy: Secondary | ICD-10-CM | POA: Insufficient documentation

## 2017-04-02 DIAGNOSIS — Z8673 Personal history of transient ischemic attack (TIA), and cerebral infarction without residual deficits: Secondary | ICD-10-CM | POA: Insufficient documentation

## 2017-04-02 DIAGNOSIS — K449 Diaphragmatic hernia without obstruction or gangrene: Secondary | ICD-10-CM | POA: Diagnosis not present

## 2017-04-02 DIAGNOSIS — Z86010 Personal history of colonic polyps: Secondary | ICD-10-CM | POA: Insufficient documentation

## 2017-04-02 DIAGNOSIS — K219 Gastro-esophageal reflux disease without esophagitis: Secondary | ICD-10-CM | POA: Diagnosis not present

## 2017-04-02 DIAGNOSIS — Z801 Family history of malignant neoplasm of trachea, bronchus and lung: Secondary | ICD-10-CM | POA: Diagnosis not present

## 2017-04-02 DIAGNOSIS — Z9012 Acquired absence of left breast and nipple: Secondary | ICD-10-CM | POA: Diagnosis not present

## 2017-04-02 DIAGNOSIS — M1990 Unspecified osteoarthritis, unspecified site: Secondary | ICD-10-CM | POA: Diagnosis not present

## 2017-04-02 DIAGNOSIS — F419 Anxiety disorder, unspecified: Secondary | ICD-10-CM | POA: Diagnosis not present

## 2017-04-02 DIAGNOSIS — Z510 Encounter for antineoplastic radiation therapy: Secondary | ICD-10-CM | POA: Diagnosis not present

## 2017-04-02 DIAGNOSIS — Z170 Estrogen receptor positive status [ER+]: Secondary | ICD-10-CM | POA: Diagnosis not present

## 2017-04-02 DIAGNOSIS — D649 Anemia, unspecified: Secondary | ICD-10-CM | POA: Diagnosis not present

## 2017-04-02 DIAGNOSIS — Z85828 Personal history of other malignant neoplasm of skin: Secondary | ICD-10-CM | POA: Diagnosis not present

## 2017-04-02 DIAGNOSIS — E7800 Pure hypercholesterolemia, unspecified: Secondary | ICD-10-CM | POA: Diagnosis not present

## 2017-04-02 DIAGNOSIS — C50312 Malignant neoplasm of lower-inner quadrant of left female breast: Secondary | ICD-10-CM | POA: Diagnosis not present

## 2017-04-02 DIAGNOSIS — Z87891 Personal history of nicotine dependence: Secondary | ICD-10-CM | POA: Insufficient documentation

## 2017-04-02 NOTE — Consult Note (Signed)
NEW PATIENT EVALUATION  Name: Doris Barnes  MRN: [id number]  Date:   04/02/2017     DOB: 1940-07-01   This 77 y.o. female patient presents to the clinic for initial evaluation of stage Ia invasive mammary carcinoma ER negative PR weakly positive and HER-2/neu overexpressed status post wide local excision and sentinel node biopsy and adjuvant chemotherapy.  REFERRING PHYSICIAN: Rusty Aus, MD  CHIEF COMPLAINT:  Chief Complaint  Patient presents with  . Breast Cancer    Pt is here for initial consultation of breast cancer.      DIAGNOSIS: The encounter diagnosis was Malignant neoplasm of lower-inner quadrant of left breast in female, estrogen receptor positive ([place]).   PREVIOUS INVESTIGATIONS:  Mammogram and ultrasound reviewed Pathology reports reviewed Clinical notes reviewed  HPI: Patient is a 77 year old female who presented with an abnormal mammogram of her left breast showing an irregular mass at the 6:30 position 3 cm from the nipple. Showed what ultrasound-guided biopsy which was positive for ER negative PR borderline positive HER-2/neu overexpressed invasive mammary carcinoma. She went on to have a wide local excision and sentinel node biopsy. One sentinel lymph node was negative. Tumor all overall was 1.4 cm overall grade 3. Closest margin was 6 mm to the superior margin. She then underwent adjuvant chemotherapy with Taxol and Herceptin. Taxol was discontinued and switch for a practicing Center to side effect profile. She has 1 more cycle of chemotherapy in about a week's time. She is seen today for radiation oncology opinion. She specifically denies breast tenderness cough or bone pain.  PLANNED TREATMENT REGIMEN: Hypofractionated whole breast radiation  PAST MEDICAL HISTORY:  has a past medical history of Anemia, Anxiety, Asthma, Breast cancer ([place]), Cancer ([place]) (skin), Chronic vulvitis, Colon polyp, Cystocele, Diverticulitis, Diverticulitis, Diverticulosis,  Diverticulosis, Dyspareunia, female, Dyspnea, Dysrhythmia, Fibrocystic disease of both breasts, Gastritis, GERD (gastroesophageal reflux disease), Hemorrhoids, History of hiatal hernia, Hypercholesteremia, Hypertension, IBS (irritable bowel syndrome), IC (interstitial cystitis), Migraine, Mitral valve prolapse, OP (osteoporosis), Positional vertigo, Rectocele, Squamous cell carcinoma, Stroke ([place]), and Vaginal atrophy.    PAST SURGICAL HISTORY:  Past Surgical History:  Procedure Laterality Date  . APPENDECTOMY    . BREAST BIOPSY Left 1998   core bx- neg  . BREAST BIOPSY Left 2007   neg  . BREAST BIOPSY Left 12/14/2016   left breast US core path pending  . BREAST EXCISIONAL BIOPSY Left   . BREAST LUMPECTOMY Left 01/01/2017    INVASIVE MAMMARY CARCINOMA/Grade 3  . CARDIAC CATHETERIZATION N/A 01/18/2015   Procedure: Left Heart Cath and Coronary Angiography;  Surgeon: Teodoro Spray, MD;  Location: [facility];  Service: Cardiovascular;  Laterality: N/A;  . CATARACT EXTRACTION W/PHACO Right 12/15/2015   Procedure: CATARACT EXTRACTION PHACO AND INTRAOCULAR LENS PLACEMENT (IOC);  Surgeon: Estill Cotta, MD;  Location: [facility];  Service: Ophthalmology;  Laterality: Right;  [place] 01:20 AP% 23.9 CDE 35.49 Fluid pack lot # [id number]  . CATARACT EXTRACTION W/PHACO Left 12/29/2015   Procedure: CATARACT EXTRACTION PHACO AND INTRAOCULAR LENS PLACEMENT ([place]);  Surgeon: Estill Cotta, MD;  Location: [facility];  Service: Ophthalmology;  Laterality: Left;  [place]  01:20 AP% 25.1 CDE 32.86 Fluid pack lot # [id number]  . COLON SURGERY    . COLONOSCOPY WITH PROPOFOL N/A 02/21/2016   Procedure: COLONOSCOPY WITH PROPOFOL;  Surgeon: Manya Silvas, MD;  Location: [facility];  Service: Endoscopy;  Laterality: N/A;  . CORONARY ANGIOPLASTY    . DILATION AND CURETTAGE OF UTERUS    .  OOPHORECTOMY Bilateral   . PARTIAL MASTECTOMY WITH NEEDLE LOCALIZATION Left 01/01/2017   Procedure: PARTIAL MASTECTOMY  WITH NEEDLE LOCALIZATION;  Surgeon: Herbert Pun, MD;  Location: [facility];  Service: General;  Laterality: Left;  . PORTACATH PLACEMENT Right 01/01/2017   Procedure: INSERTION PORT-A-CATH;  Surgeon: Herbert Pun, MD;  Location: [facility];  Service: General;  Laterality: Right;  . SENTINEL NODE BIOPSY Left 01/01/2017   Procedure: SENTINEL NODE BIOPSY;  Surgeon: Herbert Pun, MD;  Location: [facility];  Service: General;  Laterality: Left;  . TONSILLECTOMY    . VAGINAL HYSTERECTOMY      FAMILY HISTORY: family history includes Colon cancer in her paternal grandfather and sister; Diabetes in her father, maternal aunt, mother, and sister; Lung cancer in her brother.  SOCIAL HISTORY:  reports that she quit smoking about 29 years ago. She has a 33.00 pack-year smoking history. she has never used smokeless tobacco. She reports that she does not drink alcohol or use drugs.  ALLERGIES: Imipramine pamoate; Bisphosphonates; Epinephrine; Prednisone; and Sulfa antibiotics  MEDICATIONS:  Current Outpatient Medications  Medication Sig Dispense Refill  . acetaminophen (TYLENOL) 325 MG tablet Take 650 mg by mouth every 6 (six) hours as needed (for headaches.).     Marland Kitchen ALPRAZolam (XANAX) 0.25 MG tablet Take 0.125-0.25 mg by mouth 2 (two) times daily as needed for anxiety or sleep.     Marland Kitchen aspirin EC 325 MG tablet Take 325 mg by mouth daily with breakfast.     . atenolol (TENORMIN) 50 MG tablet Take 50 mg by mouth daily with breakfast.     . calcium citrate-vitamin D (CITRACAL+D) 315-200 MG-UNIT tablet Take 1 tablet by mouth 2 (two) times daily.    Marland Kitchen dicyclomine (BENTYL) 20 MG tablet Take 1 tablet (20 mg total) by mouth 3 (three) times daily as needed for spasms. 30 tablet 0  . diphenhydrAMINE (BENADRYL) 25 MG tablet Take 12.5 mg by mouth every 6 (six) hours as needed for itching (from rash).    Marland Kitchen FLUoxetine (PROZAC) 40 MG capsule Take 40 mg by mouth at bedtime.    . fluticasone (FLONASE) 50  MCG/ACT nasal spray Place 2 sprays into both nostrils daily as needed for allergies.     . hydrochlorothiazide (HYDRODIURIL) 25 MG tablet Take 25 mg by mouth daily.     . lansoprazole (PREVACID) 30 MG capsule Take 30 mg by mouth daily before breakfast.     . lidocaine-prilocaine (EMLA) cream Apply to affected area once 30 g 3  . loratadine (CLARITIN) 10 MG tablet Take 10 mg by mouth daily as needed for allergies.     . Magnesium 250 MG TABS Take 250 mg by mouth daily.    . Multiple Vitamins-Minerals (PRESERVISION AREDS 2) CAPS Take 1 capsule by mouth 2 (two) times daily.     . ondansetron (ZOFRAN) 8 MG tablet Take 1 tablet (8 mg total) by mouth 2 (two) times daily as needed (Nausea or vomiting). 30 tablet 1  . polyethylene glycol (MIRALAX / GLYCOLAX) packet Take 17 g daily by mouth.    . prochlorperazine (COMPAZINE) 10 MG tablet Take 1 tablet (10 mg total) by mouth every 6 (six) hours as needed (Nausea or vomiting). 30 tablet 1  . simethicone (GAS-X) 80 MG chewable tablet Chew 1 tablet (80 mg total) every 6 (six) hours as needed by mouth for flatulence. 30 tablet 0  . simvastatin (ZOCOR) 20 MG tablet Take 20 mg by mouth daily at 8 pm.    .  telmisartan (MICARDIS) 80 MG tablet Take 40 mg by mouth daily with breakfast.    . triamcinolone ointment (KENALOG) 0.5 % Apply 1 application topically 2 (two) times daily. 30 g 0   No current facility-administered medications for this encounter.     ECOG PERFORMANCE STATUS:  0 - Asymptomatic  REVIEW OF SYSTEMS:  Patient denies any weight loss, fatigue, weakness, fever, chills or night sweats. Patient denies any loss of vision, blurred vision. Patient denies any ringing  of the ears or hearing loss. No irregular heartbeat. Patient denies heart murmur or history of fainting. Patient denies any chest pain or pain radiating to her upper extremities. Patient denies any shortness of breath, difficulty breathing at night, cough or hemoptysis. Patient denies any  swelling in the lower legs. Patient denies any nausea vomiting, vomiting of blood, or coffee ground material in the vomitus. Patient denies any stomach pain. Patient states has had normal bowel movements no significant constipation or diarrhea. Patient denies any dysuria, hematuria or significant nocturia. Patient denies any problems walking, swelling in the joints or loss of balance. Patient denies any skin changes, loss of hair or loss of weight. Patient denies any excessive worrying or anxiety or significant depression. Patient denies any problems with insomnia. Patient denies excessive thirst, polyuria, polydipsia. Patient denies any swollen glands, patient denies easy bruising or easy bleeding. Patient denies any recent infections, allergies or URI. Patient "s visual fields have not changed significantly in recent time.    PHYSICAL EXAM: BP 115/66   Pulse 78   Temp 97.7 F (36.5 C)   Resp 20   Wt 124 lb 14.3 oz (56.7 kg)   BMI 22.84 kg/m  Left breast has an old incision is well-healed as well as more recent wide local excision which is also well-healed. No dominant mass or nodularity is noted in either breast in 2 positions examined. No axillary or supraclavicular adenopathy is appreciated. Well-developed well-nourished patient in NAD. HEENT reveals PERLA, EOMI, discs not visualized.  Oral cavity is clear. No oral mucosal lesions are identified. Neck is clear without evidence of cervical or supraclavicular adenopathy. Lungs are clear to A&P. Cardiac examination is essentially unremarkable with regular rate and rhythm without murmur rub or thrill. Abdomen is benign with no organomegaly or masses noted. Motor sensory and DTR levels are equal and symmetric in the upper and lower extremities. Cranial nerves II through XII are grossly intact. Proprioception is intact. No peripheral adenopathy or edema is identified. No motor or sensory levels are noted. Crude visual fields are within normal  range.  LABORATORY DATA: Pathology reports reviewed and compatible with the above-stated findings    RADIOLOGY RESULTS: Mammogram and ultrasound reviewed and compatible with the above-stated findings   IMPRESSION: Stage I a ER negative PR borderline positive HER-2/neu overexpressed invasive mammary carcinoma of the left breast status post wide local excision and sentinel node biopsy and adjuvant chemotherapy in 77 year old female  PLAN: At this time I believe the patient would be good candidate for hyperfractionated course of radiation over 4 weeks. Would also boost her scar another 1600 cGy using electron beam. Risks and benefits of treatment including skin reaction fatigue alteration of blood counts possible inclusion of superficial lung all were discussed in detail with the patient. She seems to comprehend my treatment plan well. I personally 7 ordered CT simulation for the end of January after completion of her chemotherapy.There will be extra effort by both professional staff as well as technical staff to coordinate and  manage concurrent chemoradiation and ensuing side effects during her treatments. Patient and her accompanying friend both seem to comprehend my treatment plan well.  I would like to take this opportunity to thank you for allowing me to participate in the care of your patient.Noreene Filbert, MD

## 2017-04-03 ENCOUNTER — Ambulatory Visit
Admission: RE | Admit: 2017-04-03 | Discharge: 2017-04-03 | Disposition: A | Discharge status: Home / Self Care | Payer: Medicare HMO | Source: Ambulatory Visit | Attending: Oncology | Admitting: Oncology

## 2017-04-03 DIAGNOSIS — C50312 Malignant neoplasm of lower-inner quadrant of left female breast: Secondary | ICD-10-CM | POA: Insufficient documentation

## 2017-04-03 DIAGNOSIS — Z5112 Encounter for antineoplastic immunotherapy: Secondary | ICD-10-CM

## 2017-04-03 DIAGNOSIS — C50919 Malignant neoplasm of unspecified site of unspecified female breast: Secondary | ICD-10-CM | POA: Diagnosis not present

## 2017-04-03 DIAGNOSIS — Z170 Estrogen receptor positive status [ER+]: Secondary | ICD-10-CM | POA: Diagnosis not present

## 2017-04-03 MED ORDER — TECHNETIUM TC 99M-LABELED RED BLOOD CELLS IV KIT
20.0000 | PACK | Freq: Once | INTRAVENOUS | Status: AC | PRN
  Administered 2017-04-03: 23.41 via INTRAVENOUS

## 2017-04-06 ENCOUNTER — Inpatient Hospital Stay: Payer: Medicare HMO

## 2017-04-06 ENCOUNTER — Inpatient Hospital Stay (HOSPITAL_BASED_OUTPATIENT_CLINIC_OR_DEPARTMENT_OTHER): Payer: Medicare HMO | Admitting: Oncology

## 2017-04-06 ENCOUNTER — Encounter: Payer: Self-pay | Admitting: Oncology

## 2017-04-06 VITALS — BP 132/76 | HR 72 | Temp 97.6°F | Resp 18 | Wt 124.0 lb

## 2017-04-06 DIAGNOSIS — Z9012 Acquired absence of left breast and nipple: Secondary | ICD-10-CM

## 2017-04-06 DIAGNOSIS — Z5111 Encounter for antineoplastic chemotherapy: Secondary | ICD-10-CM

## 2017-04-06 DIAGNOSIS — R197 Diarrhea, unspecified: Secondary | ICD-10-CM | POA: Diagnosis not present

## 2017-04-06 DIAGNOSIS — R109 Unspecified abdominal pain: Secondary | ICD-10-CM | POA: Diagnosis not present

## 2017-04-06 DIAGNOSIS — K449 Diaphragmatic hernia without obstruction or gangrene: Secondary | ICD-10-CM

## 2017-04-06 DIAGNOSIS — I341 Nonrheumatic mitral (valve) prolapse: Secondary | ICD-10-CM | POA: Diagnosis not present

## 2017-04-06 DIAGNOSIS — N941 Unspecified dyspareunia: Secondary | ICD-10-CM

## 2017-04-06 DIAGNOSIS — K589 Irritable bowel syndrome without diarrhea: Secondary | ICD-10-CM

## 2017-04-06 DIAGNOSIS — Z9049 Acquired absence of other specified parts of digestive tract: Secondary | ICD-10-CM

## 2017-04-06 DIAGNOSIS — J45909 Unspecified asthma, uncomplicated: Secondary | ICD-10-CM | POA: Diagnosis not present

## 2017-04-06 DIAGNOSIS — R0602 Shortness of breath: Secondary | ICD-10-CM

## 2017-04-06 DIAGNOSIS — D701 Agranulocytosis secondary to cancer chemotherapy: Secondary | ICD-10-CM

## 2017-04-06 DIAGNOSIS — R531 Weakness: Secondary | ICD-10-CM

## 2017-04-06 DIAGNOSIS — K580 Irritable bowel syndrome with diarrhea: Secondary | ICD-10-CM

## 2017-04-06 DIAGNOSIS — I10 Essential (primary) hypertension: Secondary | ICD-10-CM

## 2017-04-06 DIAGNOSIS — Z170 Estrogen receptor positive status [ER+]: Secondary | ICD-10-CM

## 2017-04-06 DIAGNOSIS — Z95828 Presence of other vascular implants and grafts: Secondary | ICD-10-CM

## 2017-04-06 DIAGNOSIS — Z171 Estrogen receptor negative status [ER-]: Secondary | ICD-10-CM

## 2017-04-06 DIAGNOSIS — Z5112 Encounter for antineoplastic immunotherapy: Secondary | ICD-10-CM

## 2017-04-06 DIAGNOSIS — R42 Dizziness and giddiness: Secondary | ICD-10-CM

## 2017-04-06 DIAGNOSIS — T451X5A Adverse effect of antineoplastic and immunosuppressive drugs, initial encounter: Secondary | ICD-10-CM

## 2017-04-06 DIAGNOSIS — F419 Anxiety disorder, unspecified: Secondary | ICD-10-CM

## 2017-04-06 DIAGNOSIS — R5383 Other fatigue: Secondary | ICD-10-CM | POA: Diagnosis not present

## 2017-04-06 DIAGNOSIS — Z8719 Personal history of other diseases of the digestive system: Secondary | ICD-10-CM

## 2017-04-06 DIAGNOSIS — Z800 Family history of malignant neoplasm of digestive organs: Secondary | ICD-10-CM

## 2017-04-06 DIAGNOSIS — Z87891 Personal history of nicotine dependence: Secondary | ICD-10-CM

## 2017-04-06 DIAGNOSIS — Z8673 Personal history of transient ischemic attack (TIA), and cerebral infarction without residual deficits: Secondary | ICD-10-CM

## 2017-04-06 DIAGNOSIS — C50312 Malignant neoplasm of lower-inner quadrant of left female breast: Secondary | ICD-10-CM

## 2017-04-06 DIAGNOSIS — N762 Acute vulvitis: Secondary | ICD-10-CM

## 2017-04-06 DIAGNOSIS — E7800 Pure hypercholesterolemia, unspecified: Secondary | ICD-10-CM

## 2017-04-06 DIAGNOSIS — Z90710 Acquired absence of both cervix and uterus: Secondary | ICD-10-CM

## 2017-04-06 DIAGNOSIS — K219 Gastro-esophageal reflux disease without esophagitis: Secondary | ICD-10-CM

## 2017-04-06 LAB — CBC WITH DIFFERENTIAL/PLATELET
Basophils Absolute: 0 10*3/uL (ref 0–0.1)
Basophils Relative: 2 %
Eosinophils Absolute: 0.1 10*3/uL (ref 0–0.7)
Eosinophils Relative: 6 %
HCT: 29.3 % — ABNORMAL LOW (ref 35.0–47.0)
Hemoglobin: 9.8 g/dL — ABNORMAL LOW (ref 12.0–16.0)
Lymphocytes Relative: 43 %
Lymphs Abs: 0.9 10*3/uL — ABNORMAL LOW (ref 1.0–3.6)
MCH: 29.7 pg (ref 26.0–34.0)
MCHC: 33.6 g/dL (ref 32.0–36.0)
MCV: 88.5 fL (ref 80.0–100.0)
Monocytes Absolute: 0.2 10*3/uL (ref 0.2–0.9)
Monocytes Relative: 9 %
Neutro Abs: 0.8 10*3/uL — ABNORMAL LOW (ref 1.4–6.5)
Neutrophils Relative %: 40 %
Platelets: 227 10*3/uL (ref 150–440)
RBC: 3.32 MIL/uL — ABNORMAL LOW (ref 3.80–5.20)
RDW: 15.2 % — ABNORMAL HIGH (ref 11.5–14.5)
WBC: 2 10*3/uL — ABNORMAL LOW (ref 3.6–11.0)

## 2017-04-06 LAB — COMPREHENSIVE METABOLIC PANEL
ALT: 26 U/L (ref 14–54)
AST: 30 U/L (ref 15–41)
Albumin: 3.4 g/dL — ABNORMAL LOW (ref 3.5–5.0)
Alkaline Phosphatase: 61 U/L (ref 38–126)
Anion gap: 7 (ref 5–15)
BUN: 8 mg/dL (ref 6–20)
CO2: 27 mmol/L (ref 22–32)
Calcium: 8.5 mg/dL — ABNORMAL LOW (ref 8.9–10.3)
Chloride: 99 mmol/L — ABNORMAL LOW (ref 101–111)
Creatinine, Ser: 0.61 mg/dL (ref 0.44–1.00)
GFR calc Af Amer: >60 mL/min (ref >60)
GFR calc non Af Amer: >60 mL/min (ref >60)
Glucose, Bld: 145 mg/dL — ABNORMAL HIGH (ref 65–99)
Potassium: 3.6 mmol/L (ref 3.5–5.1)
Sodium: 133 mmol/L — ABNORMAL LOW (ref 135–145)
Total Bilirubin: 0.4 mg/dL (ref 0.3–1.2)
Total Protein: 6.2 g/dL — ABNORMAL LOW (ref 6.5–8.1)

## 2017-04-06 MED ORDER — HEPARIN SOD (PORK) LOCK FLUSH 100 UNIT/ML IV SOLN
500.0000 [IU] | Freq: Once | INTRAVENOUS | Status: AC
  Administered 2017-04-06: 500 [IU] via INTRAVENOUS

## 2017-04-06 MED ORDER — SODIUM CHLORIDE 0.9% FLUSH
10.0000 mL | INTRAVENOUS | Status: DC | PRN
  Administered 2017-04-06: 10 mL via INTRAVENOUS
  Filled 2017-04-06: qty 10

## 2017-04-06 NOTE — Progress Notes (Signed)
Patient here for follow up with labs and her last chemo treatment today. She reports having abdominal cramps and diarrhea the past two Wednesdays all day, and complains of abdominal pain today.

## 2017-04-06 NOTE — Progress Notes (Signed)
ON PATHWAY REGIMEN - Breast  No Change  Continue With Treatment as Ordered.   Paclitaxel Weekly + Trastuzumab Weekly:   Administer weekly:     Paclitaxel      Trastuzumab      Trastuzumab   **Always confirm dose/schedule in your pharmacy ordering system**    Trastuzumab (Maintenance - NO Loading Dose):   A cycle is every 21 days:     Trastuzumab   **Always confirm dose/schedule in your pharmacy ordering system**    Patient Characteristics: Postoperative without Neoadjuvant Therapy (Pathologic Staging), Invasive Disease, Adjuvant Therapy, HER2 Positive, ER Negative/Unknown, Node Negative, pT1c, , pN0/N72m Therapeutic Status: Postoperative without Neoadjuvant Therapy (Pathologic Staging) AJCC Grade: G3 AJCC N Category: pN0 AJCC M Category: cM0 ER Status: Negative (-) AJCC 8 Stage Grouping: IA HER2 Status: Positive (+) Oncotype Dx Recurrence Score: Not Appropriate AJCC T Category: pT1c PR Status: Positive (+) Intent of Therapy: Curative Intent, Discussed with Patient

## 2017-04-06 NOTE — Progress Notes (Signed)
Hematology/Oncology Consult note [facility]  [phone] [phone]  Patient Care Team: Rusty Aus, MD as PCP - General (Internal Medicine)   Name of the patient: Doris Barnes  [id number]  31-Mar-1940   Date of visit: 04/06/17  Diagnosis- Stage IA invasive mammary carcinoma of the left breast ER negative, PR weakly positive and HER-2 positive  Chief complaint/ Reason for visit- on treatment assessment prior to cycle 12 of herceptin and cycle 4 of weekly abraxane  Heme/Onc history:1. Patient is a 77 year old female who underwent bilateral screening mammogram on 11/28/2016 which showed a possible mass and calcifications in her left breast. This was followed by diagnostic mammogram and an ultrasound of the left breast which showed: 1.4 x 1.2 x 1.3 cm irregular mass in the left breast at the 6:30 position 3 cm from the nipple. On magnification views the overall extent of the mass and calcifications measures 1.9 cm. Left axilla was evaluated with ultrasound and showed no enlarged and morphologically abnormal lymph nodes.   2. She underwent core biopsy of the left breast mass which showed: Invasive mammary carcinoma, grade 3. No DCIS or lymphovascular invasion. ER was negative and PR was 11-50% positive and HER-2 was +3 on IHC  3Menarche at the age of 35 years. G5 P5 L5. She did have twoprior biopsiesof her left breast which did not reveal any malignancy. No family history of breast or ovarian cancer. Her sister and maternal grandfather had colon cancer. She is considerably anxious today at the thought of getting chemotherapy. She does have a history of mitral valve prolapse and states that she has "irregular heartbeat"for which she is on atenolol. She had a Holter monitor in the past but was not found to have any arrhythmia.Baseline MUGA scan showed a normal EF of 64.5%  4. Patient underwent lumpectomy on 01/01/2017 which showed invasive  mammary carcinoma, size of tumor was 14 mm, grade 3 with negative margins. 0 out of one lymph nodes was positive for malignancy. There was associated DCIS. Lymphovascular invasion was negative. ER negative, PR 11-50% positive and HER-2/neu +3  5. Adjuvant taxol herceptin chemo started on 01/16/17.Patient had significant fatigue abdominal bloating and Cramping as well as nosebleeds from Taxol and did not wish to continue with Taxol. She received 5 cycles of weekly Taxol. chemotherapy was held for 3 weeks and did not wish to continue with chemotherapy at that time. Trial of Abraxane was given for cycle #9 of treatment which she tolerated very well.    Interval history-patient reports abdominal cramping and loose stools once last week and 2 days this week.  She feels fatigued.  Denies any fever or other symptoms.  Skin rash has resolved  ECOG PS- 0 Pain scale- 0   Review of systems- Review of Systems  Constitutional: Positive for malaise/fatigue. Negative for chills, fever and weight loss.  HENT: Negative for congestion, ear discharge and nosebleeds.   Eyes: Negative for blurred vision.  Respiratory: Negative for cough, hemoptysis, sputum production, shortness of breath and wheezing.   Cardiovascular: Negative for chest pain, palpitations, orthopnea and claudication.  Gastrointestinal: Positive for abdominal pain and diarrhea. Negative for blood in stool, constipation, heartburn, melena, nausea and vomiting.  Genitourinary: Negative for dysuria, flank pain, frequency, hematuria and urgency.  Musculoskeletal: Negative for back pain, joint pain and myalgias.  Skin: Negative for rash.  Neurological: Negative for dizziness, tingling, focal weakness, seizures, weakness and headaches.  Endo/Heme/Allergies: Does not bruise/bleed easily.  Psychiatric/Behavioral: Negative  for depression and suicidal ideas. The patient does not have insomnia.       Allergies  Allergen Reactions  . Imipramine  Pamoate Shortness Of Breath  . Bisphosphonates Nausea Only  . Epinephrine Other (See Comments)    Difficulty breathing  . Prednisone Palpitations  . Sulfa Antibiotics Rash    Face turns red and stings     Past Medical History:  Diagnosis Date  . Anemia   . Anxiety   . Asthma   . Breast cancer ([place])   . Cancer ([place]) skin  . Chronic vulvitis   . Colon polyp   . Cystocele   . Diverticulitis   . Diverticulitis   . Diverticulosis   . Diverticulosis   . Dyspareunia, female   . Dyspnea   . Dysrhythmia   . Fibrocystic disease of both breasts   . Gastritis   . GERD (gastroesophageal reflux disease)    gastritis also  . Hemorrhoids   . History of hiatal hernia   . Hypercholesteremia   . Hypertension   . IBS (irritable bowel syndrome)   . IC (interstitial cystitis)   . Migraine   . Mitral valve prolapse   . OP (osteoporosis)   . Positional vertigo   . Rectocele   . Squamous cell carcinoma   . Stroke [facility])    TIA  . Vaginal atrophy      Past Surgical History:  Procedure Laterality Date  . APPENDECTOMY    . BREAST BIOPSY Left 1998   core bx- neg  . BREAST BIOPSY Left 2007   neg  . BREAST BIOPSY Left 12/14/2016   left breast US core path pending  . BREAST EXCISIONAL BIOPSY Left   . BREAST LUMPECTOMY Left 01/01/2017    INVASIVE MAMMARY CARCINOMA/Grade 3  . CARDIAC CATHETERIZATION N/A 01/18/2015   Procedure: Left Heart Cath and Coronary Angiography;  Surgeon: Teodoro Spray, MD;  Location: [place] [facility];  Service: Cardiovascular;  Laterality: N/A;  . CATARACT EXTRACTION W/PHACO Right 12/15/2015   Procedure: CATARACT EXTRACTION PHACO AND INTRAOCULAR LENS PLACEMENT (IOC);  Surgeon: Estill Cotta, MD;  Location: [facility] ORS;  Service: Ophthalmology;  Laterality: Right;  [place] 01:20 AP% 23.9 CDE 35.49 Fluid pack lot # [id number]  . CATARACT EXTRACTION W/PHACO Left 12/29/2015   Procedure: CATARACT EXTRACTION PHACO AND INTRAOCULAR LENS PLACEMENT ([place]);  Surgeon: Estill Cotta, MD;  Location: [facility] ORS;  Service: Ophthalmology;  Laterality: Left;  [place]  01:20 AP% 25.1 CDE 32.86 Fluid pack lot # [id number]  . COLON SURGERY    . COLONOSCOPY WITH PROPOFOL N/A 02/21/2016   Procedure: COLONOSCOPY WITH PROPOFOL;  Surgeon: Manya Silvas, MD;  Location: [facility] ENDOSCOPY;  Service: Endoscopy;  Laterality: N/A;  . CORONARY ANGIOPLASTY    . DILATION AND CURETTAGE OF UTERUS    . OOPHORECTOMY Bilateral   . PARTIAL MASTECTOMY WITH NEEDLE LOCALIZATION Left 01/01/2017   Procedure: PARTIAL MASTECTOMY WITH NEEDLE LOCALIZATION;  Surgeon: Herbert Pun, MD;  Location: [facility] ORS;  Service: General;  Laterality: Left;  . PORTACATH PLACEMENT Right 01/01/2017   Procedure: INSERTION PORT-A-CATH;  Surgeon: Herbert Pun, MD;  Location: [facility] ORS;  Service: General;  Laterality: Right;  . SENTINEL NODE BIOPSY Left 01/01/2017   Procedure: SENTINEL NODE BIOPSY;  Surgeon: Herbert Pun, MD;  Location: [facility] ORS;  Service: General;  Laterality: Left;  . TONSILLECTOMY    . VAGINAL HYSTERECTOMY      Social History   Socioeconomic History  . Marital status: Married  Spouse name: Not on file  . Number of children: Not on file  . Years of education: Not on file  . Highest education level: Not on file  Social Needs  . Financial resource strain: Not on file  . Food insecurity - worry: Not on file  . Food insecurity - inability: Not on file  . Transportation needs - medical: Not on file  . Transportation needs - non-medical: Not on file  Occupational History  . Not on file  Tobacco Use  . Smoking status: Former Smoker    Packs/day: 1.00    Years: 33.00    Pack years: 33.00    Last attempt to quit: 10/17/1987    Years since quitting: 29.4  . Smokeless tobacco: Never Used  Substance and Sexual Activity  . Alcohol use: No  . Drug use: No  . Sexual activity: Yes  Other Topics Concern  . Not on file  Social History Narrative  . Not on file    Family  History  Problem Relation Age of Onset  . Diabetes Mother   . Diabetes Father   . Colon cancer Sister   . Diabetes Sister   . Diabetes Maternal Aunt   . Colon cancer Paternal Grandfather   . Lung cancer Brother   . Breast cancer Neg Hx   . Ovarian cancer Neg Hx      Current Outpatient Medications:  .  acetaminophen (TYLENOL) 325 MG tablet, Take 650 mg by mouth every 6 (six) hours as needed (for headaches.). , Disp: , Rfl:  .  ALPRAZolam (XANAX) 0.25 MG tablet, Take 0.125-0.25 mg by mouth 2 (two) times daily as needed for anxiety or sleep. , Disp: , Rfl:  .  aspirin EC 325 MG tablet, Take 325 mg by mouth daily with breakfast. , Disp: , Rfl:  .  atenolol (TENORMIN) 50 MG tablet, Take 50 mg by mouth daily with breakfast. , Disp: , Rfl:  .  calcium citrate-vitamin D (CITRACAL+D) 315-200 MG-UNIT tablet, Take 1 tablet by mouth 2 (two) times daily., Disp: , Rfl:  .  dicyclomine (BENTYL) 20 MG tablet, Take 1 tablet (20 mg total) by mouth 3 (three) times daily as needed for spasms., Disp: 30 tablet, Rfl: 0 .  diphenhydrAMINE (BENADRYL) 25 MG tablet, Take 12.5 mg by mouth every 6 (six) hours as needed for itching (from rash)., Disp: , Rfl:  .  FLUoxetine (PROZAC) 40 MG capsule, Take 40 mg by mouth at bedtime., Disp: , Rfl:  .  fluticasone (FLONASE) 50 MCG/ACT nasal spray, Place 2 sprays into both nostrils daily as needed for allergies. , Disp: , Rfl:  .  hydrochlorothiazide (HYDRODIURIL) 25 MG tablet, Take 25 mg by mouth daily. , Disp: , Rfl:  .  lansoprazole (PREVACID) 30 MG capsule, Take 30 mg by mouth daily before breakfast. , Disp: , Rfl:  .  lidocaine-prilocaine (EMLA) cream, Apply to affected area once, Disp: 30 g, Rfl: 3 .  loratadine (CLARITIN) 10 MG tablet, Take 10 mg by mouth daily as needed for allergies. , Disp: , Rfl:  .  Magnesium 250 MG TABS, Take 250 mg by mouth daily., Disp: , Rfl:  .  Multiple Vitamins-Minerals (PRESERVISION AREDS 2) CAPS, Take 1 capsule by mouth 2 (two) times  daily. , Disp: , Rfl:  .  ondansetron (ZOFRAN) 8 MG tablet, Take 1 tablet (8 mg total) by mouth 2 (two) times daily as needed (Nausea or vomiting)., Disp: 30 tablet, Rfl: 1 .  polyethylene glycol (MIRALAX /  GLYCOLAX) packet, Take 17 g daily by mouth., Disp: , Rfl:  .  prochlorperazine (COMPAZINE) 10 MG tablet, Take 1 tablet (10 mg total) by mouth every 6 (six) hours as needed (Nausea or vomiting)., Disp: 30 tablet, Rfl: 1 .  simethicone (GAS-X) 80 MG chewable tablet, Chew 1 tablet (80 mg total) every 6 (six) hours as needed by mouth for flatulence., Disp: 30 tablet, Rfl: 0 .  simvastatin (ZOCOR) 20 MG tablet, Take 20 mg by mouth daily at 8 pm., Disp: , Rfl:  .  telmisartan (MICARDIS) 80 MG tablet, Take 40 mg by mouth daily with breakfast., Disp: , Rfl:  .  triamcinolone ointment (KENALOG) 0.5 %, Apply 1 application topically 2 (two) times daily., Disp: 30 g, Rfl: 0  Physical exam:  Vitals:   04/06/17 0913  BP: 132/76  Pulse: 72  Resp: 18  Temp: 97.6 F (36.4 C)  TempSrc: Tympanic  Weight: 124 lb (56.2 kg)   Physical Exam  Constitutional: She is oriented to person, place, and time and well-developed, well-nourished, and in no distress.  HENT:  Head: Normocephalic and atraumatic.  Eyes: EOM are normal. Pupils are equal, round, and reactive to light.  Neck: Normal range of motion.  Cardiovascular: Normal rate, regular rhythm and normal heart sounds.  Pulmonary/Chest: Effort normal and breath sounds normal.  Abdominal: Soft. Bowel sounds are normal.  Mild epigastric tenderness to palpation  Neurological: She is alert and oriented to person, place, and time.  Skin: Skin is warm and dry.     CMP Latest Ref Rng & Units 04/06/2017  Glucose 65 - 99 mg/dL 145(H)  BUN 6 - 20 mg/dL 8  Creatinine 0.44 - 1.00 mg/dL 0.61  Sodium 135 - 145 mmol/L 133(L)  Potassium 3.5 - 5.1 mmol/L 3.6  Chloride 101 - 111 mmol/L 99(L)  CO2 22 - 32 mmol/L 27  Calcium 8.9 - 10.3 mg/dL 8.5(L)  Total Protein 6.5  - 8.1 g/dL 6.2(L)  Total Bilirubin 0.3 - 1.2 mg/dL 0.4  Alkaline Phos 38 - 126 U/L 61  AST 15 - 41 U/L 30  ALT 14 - 54 U/L 26   CBC Latest Ref Rng & Units 04/06/2017  WBC 3.6 - 11.0 K/uL 2.0(L)  Hemoglobin 12.0 - 16.0 g/dL 9.8(L)  Hematocrit 35.0 - 47.0 % 29.3(L)  Platelets 150 - 440 K/uL 227    No images are attached to the encounter.  Nm Cardiac Muga Rest  Result Date: 04/03/2017 CLINICAL DATA:  Breast cancer. Evaluate cardiac function in relation to chemotherapy. EXAM: NUCLEAR MEDICINE CARDIAC BLOOD POOL IMAGING (MUGA) TECHNIQUE: Cardiac multi-gated acquisition was performed at rest following intravenous injection of Tc-32mlabeled red blood cells. RADIOPHARMACEUTICALS:  23.4 mCi [id number] in-vitro labeled red blood cells IV COMPARISON:  12/27/2016 FINDINGS: No  focal wall motion abnormality of the left ventricle. Calculated left ventricular ejection fraction equals 65.2%. Comparable to 64.5% on 01/06/2017 IMPRESSION: Left ventricular ejection fraction equals   65.2% . Electronically Signed   By: StSuzy Bouchard.D.   On: 04/03/2017 14:58     Assessment and plan- Patient is a [age].o. female pathologicalprognostic stage IA pT1c1p N0 cM0invasive mammary carcinoma of the left breast ER negative PR positive and HER-2 +3 on IHC s/p lumpectomy here for on treatment assessment prior to cycle12of weekly herceptin and cycle 4 of weekly Abraxane  Patient was supposed to get her last dose of Abraxane today along with the 12th weekly cycle of Herceptin.  However patient is neutropenic today and her  ANC 0.8.  She is also been having ongoing diarrhea which only started with last dose of Abraxane.  When she received only Herceptin for 3 weeks she did not report any diarrhea.  Options at this time would be to defer chemotherapy by 1 more week or skip chemo entirely.  Patient does not wish to pursue any more Abraxane.  I will repeat her CBC CMP in 1 week's time and see her.  At that time I will  switch her to Herceptin every 3 weeks.  Patient is already met with radiation oncology and will be starting adjuvant radiation soon  Patient's tumor is ER negative and PR 11-50% positive.  There is a role for hormone therapy here however she may not derive a whole lot of benefit from it given that she has an ER negative tumor.  I will discuss hormone therapy in greater detail during her course of radiation.  We will plan to get her port removed after radiation  Chemo-induced neutropenia: No evidence of fever or infectious symptoms.  Continue to monitor.  She would likely recover her counts by next week.  Neutropenic precautions reviewed and she will call us if she has any worsening infectious symptoms or fever  Diarrhea possibly secondary to Abraxane as she did not have it when she was receiving Herceptin.  Will check stool C. difficile sample at this time   Visit Diagnosis 1. Diarrhea, unspecified type   2. Chemotherapy induced neutropenia (HCC)   3. Malignant neoplasm of lower-inner quadrant of left breast in female, estrogen receptor positive ([place])      Dr. Randa Evens, MD, MPH [facility] Pager- [phone] 04/06/2017 9:59 AM

## 2017-04-12 ENCOUNTER — Other Ambulatory Visit: Payer: Self-pay | Admitting: *Deleted

## 2017-04-12 DIAGNOSIS — IMO0002 Reserved for concepts with insufficient information to code with codable children: Secondary | ICD-10-CM

## 2017-04-13 ENCOUNTER — Inpatient Hospital Stay: Payer: Medicare HMO

## 2017-04-13 ENCOUNTER — Encounter: Payer: Self-pay | Admitting: Oncology

## 2017-04-13 ENCOUNTER — Inpatient Hospital Stay (HOSPITAL_BASED_OUTPATIENT_CLINIC_OR_DEPARTMENT_OTHER): Payer: Medicare HMO | Admitting: Oncology

## 2017-04-13 VITALS — BP 128/80 | HR 83 | Temp 97.6°F | Resp 18 | Wt 125.8 lb

## 2017-04-13 DIAGNOSIS — Z170 Estrogen receptor positive status [ER+]: Principal | ICD-10-CM

## 2017-04-13 DIAGNOSIS — D701 Agranulocytosis secondary to cancer chemotherapy: Secondary | ICD-10-CM

## 2017-04-13 DIAGNOSIS — R0602 Shortness of breath: Secondary | ICD-10-CM

## 2017-04-13 DIAGNOSIS — C50312 Malignant neoplasm of lower-inner quadrant of left female breast: Secondary | ICD-10-CM

## 2017-04-13 DIAGNOSIS — Z5112 Encounter for antineoplastic immunotherapy: Secondary | ICD-10-CM | POA: Diagnosis not present

## 2017-04-13 DIAGNOSIS — Z800 Family history of malignant neoplasm of digestive organs: Secondary | ICD-10-CM

## 2017-04-13 DIAGNOSIS — Z8719 Personal history of other diseases of the digestive system: Secondary | ICD-10-CM

## 2017-04-13 DIAGNOSIS — Z8673 Personal history of transient ischemic attack (TIA), and cerebral infarction without residual deficits: Secondary | ICD-10-CM

## 2017-04-13 DIAGNOSIS — N941 Unspecified dyspareunia: Secondary | ICD-10-CM

## 2017-04-13 DIAGNOSIS — Z9049 Acquired absence of other specified parts of digestive tract: Secondary | ICD-10-CM

## 2017-04-13 DIAGNOSIS — R109 Unspecified abdominal pain: Secondary | ICD-10-CM

## 2017-04-13 DIAGNOSIS — R197 Diarrhea, unspecified: Secondary | ICD-10-CM | POA: Diagnosis not present

## 2017-04-13 DIAGNOSIS — IMO0002 Reserved for concepts with insufficient information to code with codable children: Secondary | ICD-10-CM

## 2017-04-13 DIAGNOSIS — R5383 Other fatigue: Secondary | ICD-10-CM

## 2017-04-13 DIAGNOSIS — Z171 Estrogen receptor negative status [ER-]: Secondary | ICD-10-CM | POA: Diagnosis not present

## 2017-04-13 DIAGNOSIS — R531 Weakness: Secondary | ICD-10-CM | POA: Diagnosis not present

## 2017-04-13 DIAGNOSIS — N762 Acute vulvitis: Secondary | ICD-10-CM

## 2017-04-13 DIAGNOSIS — I341 Nonrheumatic mitral (valve) prolapse: Secondary | ICD-10-CM

## 2017-04-13 DIAGNOSIS — Z9012 Acquired absence of left breast and nipple: Secondary | ICD-10-CM

## 2017-04-13 DIAGNOSIS — I10 Essential (primary) hypertension: Secondary | ICD-10-CM

## 2017-04-13 DIAGNOSIS — Z87891 Personal history of nicotine dependence: Secondary | ICD-10-CM

## 2017-04-13 DIAGNOSIS — J45909 Unspecified asthma, uncomplicated: Secondary | ICD-10-CM

## 2017-04-13 DIAGNOSIS — K219 Gastro-esophageal reflux disease without esophagitis: Secondary | ICD-10-CM | POA: Diagnosis not present

## 2017-04-13 DIAGNOSIS — E7800 Pure hypercholesterolemia, unspecified: Secondary | ICD-10-CM

## 2017-04-13 DIAGNOSIS — K589 Irritable bowel syndrome without diarrhea: Secondary | ICD-10-CM

## 2017-04-13 DIAGNOSIS — K449 Diaphragmatic hernia without obstruction or gangrene: Secondary | ICD-10-CM

## 2017-04-13 DIAGNOSIS — Z5111 Encounter for antineoplastic chemotherapy: Secondary | ICD-10-CM

## 2017-04-13 DIAGNOSIS — F419 Anxiety disorder, unspecified: Secondary | ICD-10-CM

## 2017-04-13 DIAGNOSIS — Z90710 Acquired absence of both cervix and uterus: Secondary | ICD-10-CM

## 2017-04-13 DIAGNOSIS — R42 Dizziness and giddiness: Secondary | ICD-10-CM

## 2017-04-13 DIAGNOSIS — T451X5A Adverse effect of antineoplastic and immunosuppressive drugs, initial encounter: Secondary | ICD-10-CM

## 2017-04-13 DIAGNOSIS — K580 Irritable bowel syndrome with diarrhea: Secondary | ICD-10-CM

## 2017-04-13 LAB — CBC WITH DIFFERENTIAL/PLATELET
Basophils Absolute: 0.1 10*3/uL (ref 0–0.1)
Basophils Relative: 1 %
Eosinophils Absolute: 0.3 10*3/uL (ref 0–0.7)
Eosinophils Relative: 6 %
HCT: 31.8 % — ABNORMAL LOW (ref 35.0–47.0)
Hemoglobin: 10.8 g/dL — ABNORMAL LOW (ref 12.0–16.0)
Lymphocytes Relative: 35 %
Lymphs Abs: 1.6 10*3/uL (ref 1.0–3.6)
MCH: 30.3 pg (ref 26.0–34.0)
MCHC: 34.1 g/dL (ref 32.0–36.0)
MCV: 88.8 fL (ref 80.0–100.0)
Monocytes Absolute: 0.6 10*3/uL (ref 0.2–0.9)
Monocytes Relative: 14 %
Neutro Abs: 1.9 10*3/uL (ref 1.4–6.5)
Neutrophils Relative %: 44 %
Platelets: 276 10*3/uL (ref 150–440)
RBC: 3.58 MIL/uL — ABNORMAL LOW (ref 3.80–5.20)
RDW: 15.6 % — ABNORMAL HIGH (ref 11.5–14.5)
WBC: 4.4 10*3/uL (ref 3.6–11.0)

## 2017-04-13 LAB — COMPREHENSIVE METABOLIC PANEL
ALT: 23 U/L (ref 14–54)
AST: 28 U/L (ref 15–41)
Albumin: 3.7 g/dL (ref 3.5–5.0)
Alkaline Phosphatase: 71 U/L (ref 38–126)
Anion gap: 7 (ref 5–15)
BUN: 11 mg/dL (ref 6–20)
CO2: 27 mmol/L (ref 22–32)
Calcium: 8.6 mg/dL — ABNORMAL LOW (ref 8.9–10.3)
Chloride: 100 mmol/L — ABNORMAL LOW (ref 101–111)
Creatinine, Ser: 0.88 mg/dL (ref 0.44–1.00)
GFR calc Af Amer: >60 mL/min (ref >60)
GFR calc non Af Amer: >60 mL/min (ref >60)
Glucose, Bld: 108 mg/dL — ABNORMAL HIGH (ref 65–99)
Potassium: 3.9 mmol/L (ref 3.5–5.1)
Sodium: 134 mmol/L — ABNORMAL LOW (ref 135–145)
Total Bilirubin: 0.5 mg/dL (ref 0.3–1.2)
Total Protein: 6.3 g/dL — ABNORMAL LOW (ref 6.5–8.1)

## 2017-04-13 MED ORDER — TRASTUZUMAB CHEMO 150 MG IV SOLR
450.0000 mg | Freq: Once | INTRAVENOUS | Status: AC
  Administered 2017-04-13: 450 mg via INTRAVENOUS
  Filled 2017-04-13: qty 21.4

## 2017-04-13 MED ORDER — ACETAMINOPHEN 325 MG PO TABS
650.0000 mg | ORAL_TABLET | Freq: Once | ORAL | Status: AC
  Administered 2017-04-13: 650 mg via ORAL
  Filled 2017-04-13: qty 2

## 2017-04-13 MED ORDER — HEPARIN SOD (PORK) LOCK FLUSH 100 UNIT/ML IV SOLN
500.0000 [IU] | Freq: Once | INTRAVENOUS | Status: AC | PRN
  Administered 2017-04-13: 500 [IU]
  Filled 2017-04-13: qty 5

## 2017-04-13 MED ORDER — SODIUM CHLORIDE 0.9% FLUSH
10.0000 mL | INTRAVENOUS | Status: DC | PRN
  Administered 2017-04-13: 10 mL
  Filled 2017-04-13: qty 10

## 2017-04-13 MED ORDER — DIPHENHYDRAMINE HCL 25 MG PO CAPS
50.0000 mg | ORAL_CAPSULE | Freq: Once | ORAL | Status: AC
  Administered 2017-04-13: 50 mg via ORAL
  Filled 2017-04-13: qty 2

## 2017-04-13 MED ORDER — SODIUM CHLORIDE 0.9 % IV SOLN
Freq: Once | INTRAVENOUS | Status: AC
  Administered 2017-04-13: 14:00:00 via INTRAVENOUS
  Filled 2017-04-13: qty 1000

## 2017-04-13 NOTE — Progress Notes (Signed)
Hematology/Oncology Consult note [facility]  [phone] [phone]  Patient Care Team: Rusty Aus, MD as PCP - General (Internal Medicine)   Name of the patient: Doris Barnes  [id number]  10/03/1940   Date of visit: 04/13/17  Diagnosis- Stage IA invasive mammary carcinoma of the left breast ER negative, PR weakly positive and HER-2 positive  Chief complaint/ Reason for visit- on treatment assessment prior tocycle 1 of Q3 weekly herceptin after 12 cycles of weekly taxol(abraxane) and herecptin  Heme/Onc history:1. Patient is a 77 year old female who underwent bilateral screening mammogram on 11/28/2016 which showed a possible mass and calcifications in her left breast. This was followed by diagnostic mammogram and an ultrasound of the left breast which showed: 1.4 x 1.2 x 1.3 cm irregular mass in the left breast at the 6:30 position 3 cm from the nipple. On magnification views the overall extent of the mass and calcifications measures 1.9 cm. Left axilla was evaluated with ultrasound and showed no enlarged and morphologically abnormal lymph nodes.   2. She underwent core biopsy of the left breast mass which showed: Invasive mammary carcinoma, grade 3. No DCIS or lymphovascular invasion. ER was negative and PR was 11-50% positive and HER-2 was +3 on IHC  3Menarche at the age of 51 years. G5 P5 L5. She did have twoprior biopsiesof her left breast which did not reveal any malignancy. No family history of breast or ovarian cancer. Her sister and maternal grandfather had colon cancer. She is considerably anxious today at the thought of getting chemotherapy. She does have a history of mitral valve prolapse and states that she has "irregular heartbeat"for which she is on atenolol. She had a Holter monitor in the past but was not found to have any arrhythmia.Baseline MUGA scan showed a normal EF of 64.5%  4. Patient underwent lumpectomy  on 01/01/2017 which showed invasive mammary carcinoma, size of tumor was 14 mm, grade 3 with negative margins. 0 out of one lymph nodes was positive for malignancy. There was associated DCIS. Lymphovascular invasion was negative. ER negative, PR 11-50% positive and HER-2/neu +3  5. Adjuvant taxol herceptin chemo started on 01/16/17.Patient had significant fatigue abdominal bloating and Cramping as well as nosebleeds from Taxol and did not wish to continue with Taxol. She received 5 cycles of weekly Taxol. chemotherapy was held for 3 weeks and did not wish to continue with chemotherapy at that time.Trial of Abraxane was given for cycle #9 of treatment which she tolerated very well.   Interval history-reports feeling back to her baseline.  Denies any fatigue or diarrhea.  Denies any peripheral neuropathy  ECOG PS- 0 Pain scale- 0   Review of systems- Review of Systems  Constitutional: Negative for chills, fever, malaise/fatigue and weight loss.  HENT: Negative for congestion, ear discharge and nosebleeds.   Eyes: Negative for blurred vision.  Respiratory: Negative for cough, hemoptysis, sputum production, shortness of breath and wheezing.   Cardiovascular: Negative for chest pain, palpitations, orthopnea and claudication.  Gastrointestinal: Negative for abdominal pain, blood in stool, constipation, diarrhea, heartburn, melena, nausea and vomiting.  Genitourinary: Negative for dysuria, flank pain, frequency, hematuria and urgency.  Musculoskeletal: Negative for back pain, joint pain and myalgias.  Skin: Negative for rash.  Neurological: Negative for dizziness, tingling, focal weakness, seizures, weakness and headaches.  Endo/Heme/Allergies: Does not bruise/bleed easily.  Psychiatric/Behavioral: Negative for depression and suicidal ideas. The patient does not have insomnia.      Allergies  Allergen Reactions  . Imipramine Pamoate Shortness Of Breath  . Bisphosphonates Nausea Only  .  Epinephrine Other (See Comments)    Difficulty breathing  . Prednisone Palpitations  . Sulfa Antibiotics Rash    Face turns red and stings     Past Medical History:  Diagnosis Date  . Anemia   . Anxiety   . Asthma   . Breast cancer ([place])   . Cancer ([place]) skin  . Chronic vulvitis   . Colon polyp   . Cystocele   . Diverticulitis   . Diverticulitis   . Diverticulosis   . Diverticulosis   . Dyspareunia, female   . Dyspnea   . Dysrhythmia   . Fibrocystic disease of both breasts   . Gastritis   . GERD (gastroesophageal reflux disease)    gastritis also  . Hemorrhoids   . History of hiatal hernia   . Hypercholesteremia   . Hypertension   . IBS (irritable bowel syndrome)   . IC (interstitial cystitis)   . Migraine   . Mitral valve prolapse   . OP (osteoporosis)   . Positional vertigo   . Rectocele   . Squamous cell carcinoma   . Stroke [facility])    TIA  . Vaginal atrophy      Past Surgical History:  Procedure Laterality Date  . APPENDECTOMY    . BREAST BIOPSY Left 1998   core bx- neg  . BREAST BIOPSY Left 2007   neg  . BREAST BIOPSY Left 12/14/2016   left breast US core path pending  . BREAST EXCISIONAL BIOPSY Left   . BREAST LUMPECTOMY Left 01/01/2017    INVASIVE MAMMARY CARCINOMA/Grade 3  . CARDIAC CATHETERIZATION N/A 01/18/2015   Procedure: Left Heart Cath and Coronary Angiography;  Surgeon: Teodoro Spray, MD;  Location: [facility];  Service: Cardiovascular;  Laterality: N/A;  . CATARACT EXTRACTION W/PHACO Right 12/15/2015   Procedure: CATARACT EXTRACTION PHACO AND INTRAOCULAR LENS PLACEMENT (IOC);  Surgeon: Estill Cotta, MD;  Location: [facility] OR[facility];  Service: Ophthalmology;  Laterality: Right;  [place] 01:20 AP% 23.9 CDE 35.49 Fluid pack lot # [id number]  . CATARACT EXTRACTION W/PHACO Left 12/29/2015   Procedure: CATARACT EXTRACTION PHACO AND INTRAOCULAR LENS PLACEMENT ([place]);  Surgeon: Estill Cotta, MD;  Location: [facility];  Service: Ophthalmology;   Laterality: Left;  [place]  01:20 AP% 25.1 CDE 32.86 Fluid pack lot # [id number]  . COLON SURGERY    . COLONOSCOPY WITH PROPOFOL N/A 02/21/2016   Procedure: COLONOSCOPY WITH PROPOFOL;  Surgeon: Manya Silvas, MD;  Location[facility];  Service: Endoscopy;  Laterality: N/A;  . CORONARY ANGIOPLASTY    . DILATION AND CURETTAGE OF UTERUS    . OOPHORECTOMY Bilateral   . PARTIAL MASTECTOMY WITH NEEDLE LOCALIZATION Left 01/01/2017   Procedure: PARTIAL MASTECTOMY WITH NEEDLE LOCALIZATION;  Surgeon: Herbert Pun, MD;  Location: [facility] OR[facility];  Service: General;  Laterality: Left;  . PORTACATH PLACEMENT Right 01/01/2017   Procedure: INSERTION PORT-A-CATH;  Surgeon: Herbert Pun, MD;  Location: [facility] OR[facility];  Service: General;  Laterality: Right;  . SENTINEL NODE BIOPSY Left 01/01/2017   Procedure: SENTINEL NODE BIOPSY;  Surgeon: Herbert Pun, MD;  Location: [facility] OR[facility];  Service: General;  Laterality: Left;  . TONSILLECTOMY    . VAGINAL HYSTERECTOMY      Social History   Socioeconomic History  . Marital status: Married    Spouse name: Not on file  . Number of children: Not on file  . Years of education: Not  on file  . Highest education level: Not on file  Social Needs  . Financial resource strain: Not on file  . Food insecurity - worry: Not on file  . Food insecurity - inability: Not on file  . Transportation needs - medical: Not on file  . Transportation needs - non-medical: Not on file  Occupational History  . Not on file  Tobacco Use  . Smoking status: Former Smoker    Packs/day: 1.00    Years: 33.00    Pack years: 33.00    Last attempt to quit: 10/17/1987    Years since quitting: 29.5  . Smokeless tobacco: Never Used  Substance and Sexual Activity  . Alcohol use: No  . Drug use: No  . Sexual activity: Yes  Other Topics Concern  . Not on file  Social History Narrative  . Not on file    Family History  Problem Relation Age of Onset  . Diabetes Mother   .  Diabetes Father   . Colon cancer Sister   . Diabetes Sister   . Diabetes Maternal Aunt   . Colon cancer Paternal Grandfather   . Lung cancer Brother   . Breast cancer Neg Hx   . Ovarian cancer Neg Hx      Current Outpatient Medications:  .  acetaminophen (TYLENOL) 325 MG tablet, Take 650 mg by mouth every 6 (six) hours as needed (for headaches.). , Disp: , Rfl:  .  ALPRAZolam (XANAX) 0.25 MG tablet, Take 0.125-0.25 mg by mouth 2 (two) times daily as needed for anxiety or sleep. , Disp: , Rfl:  .  aspirin EC 325 MG tablet, Take 325 mg by mouth daily with breakfast. , Disp: , Rfl:  .  atenolol (TENORMIN) 50 MG tablet, Take 50 mg by mouth daily with breakfast. , Disp: , Rfl:  .  calcium citrate-vitamin D (CITRACAL+D) 315-200 MG-UNIT tablet, Take 1 tablet by mouth 2 (two) times daily., Disp: , Rfl:  .  dicyclomine (BENTYL) 20 MG tablet, Take 1 tablet (20 mg total) by mouth 3 (three) times daily as needed for spasms., Disp: 30 tablet, Rfl: 0 .  diphenhydrAMINE (BENADRYL) 25 MG tablet, Take 12.5 mg by mouth every 6 (six) hours as needed for itching (from rash)., Disp: , Rfl:  .  FLUoxetine (PROZAC) 40 MG capsule, Take 40 mg by mouth at bedtime., Disp: , Rfl:  .  fluticasone (FLONASE) 50 MCG/ACT nasal spray, Place 2 sprays into both nostrils daily as needed for allergies. , Disp: , Rfl:  .  hydrochlorothiazide (HYDRODIURIL) 25 MG tablet, Take 25 mg by mouth daily. , Disp: , Rfl:  .  lansoprazole (PREVACID) 30 MG capsule, Take 30 mg by mouth daily before breakfast. , Disp: , Rfl:  .  loratadine (CLARITIN) 10 MG tablet, Take 10 mg by mouth daily as needed for allergies. , Disp: , Rfl:  .  Magnesium 250 MG TABS, Take 250 mg by mouth daily., Disp: , Rfl:  .  Multiple Vitamins-Minerals (PRESERVISION AREDS 2) CAPS, Take 1 capsule by mouth 2 (two) times daily. , Disp: , Rfl:  .  polyethylene glycol (MIRALAX / GLYCOLAX) packet, Take 17 g daily by mouth., Disp: , Rfl:  .  simethicone (GAS-X) 80 MG  chewable tablet, Chew 1 tablet (80 mg total) every 6 (six) hours as needed by mouth for flatulence., Disp: 30 tablet, Rfl: 0 .  simvastatin (ZOCOR) 20 MG tablet, Take 20 mg by mouth daily at 8 pm., Disp: , Rfl:  .  telmisartan (MICARDIS) 80 MG tablet, Take 40 mg by mouth daily with breakfast., Disp: , Rfl:  .  triamcinolone ointment (KENALOG) 0.5 %, Apply 1 application topically 2 (two) times daily., Disp: 30 g, Rfl: 0  Physical exam:  Vitals:   04/13/17 1400  BP: 128/80  Pulse: 83  Resp: 18  Temp: 97.6 F (36.4 C)  TempSrc: Tympanic  Weight: 125 lb 12.8 oz (57.1 kg)   Physical Exam  Constitutional: She is oriented to person, place, and time and well-developed, well-nourished, and in no distress.  HENT:  Head: Normocephalic and atraumatic.  Eyes: EOM are normal. Pupils are equal, round, and reactive to light.  Neck: Normal range of motion.  Cardiovascular: Normal rate, regular rhythm and normal heart sounds.  Pulmonary/Chest: Effort normal and breath sounds normal.  Abdominal: Soft. Bowel sounds are normal.  Neurological: She is alert and oriented to person, place, and time.  Skin: Skin is warm and dry.     CMP Latest Ref Rng & Units 04/13/2017  Glucose 65 - 99 mg/dL 108(H)  BUN 6 - 20 mg/dL 11  Creatinine 0.44 - 1.00 mg/dL 0.88  Sodium 135 - 145 mmol/L 134(L)  Potassium 3.5 - 5.1 mmol/L 3.9  Chloride 101 - 111 mmol/L 100(L)  CO2 22 - 32 mmol/L 27  Calcium 8.9 - 10.3 mg/dL 8.6(L)  Total Protein 6.5 - 8.1 g/dL 6.3(L)  Total Bilirubin 0.3 - 1.2 mg/dL 0.5  Alkaline Phos 38 - 126 U/L 71  AST 15 - 41 U/L 28  ALT 14 - 54 U/L 23   CBC Latest Ref Rng & Units 04/13/2017  WBC 3.6 - 11.0 K/uL 4.4  Hemoglobin 12.0 - 16.0 g/dL 10.8(L)  Hematocrit 35.0 - 47.0 % 31.8(L)  Platelets 150 - 440 K/uL 276    No images are attached to the encounter.  Nm Cardiac Muga Rest  Result Date: 04/03/2017 CLINICAL DATA:  Breast cancer. Evaluate cardiac function in relation to chemotherapy. EXAM:  NUCLEAR MEDICINE CARDIAC BLOOD POOL IMAGING (MUGA) TECHNIQUE: Cardiac multi-gated acquisition was performed at rest following intravenous injection of Tc-37mlabeled red blood cells. RADIOPHARMACEUTICALS:  23.4 mCi Tc-961mDP in-vitro labeled red blood cells IV COMPARISON:  12/27/2016 FINDINGS: No  focal wall motion abnormality of the left ventricle. Calculated left ventricular ejection fraction equals 65.2%. Comparable to 64.5% on 01/06/2017 IMPRESSION: Left ventricular ejection fraction equals   65.2% . Electronically Signed   By: StSuzy Bouchard.D.   On: 04/03/2017 14:58     Assessment and plan- Patient is a [age].o. female pathologicalprognostic stage IA pT1c1p N0 cM0invasive mammary carcinoma of the left breast ER negative PR positive and HER-2 +3 on IHC s/p lumpectomy s/p adjuvant chemotherapy here for on treatment assessment prior to cycle 1 of Q3 weekly heceptin adjuvant.  Patient is already met with radiation oncology and will be starting radiation treatment soon.  Counts are okay to proceed with cycle #1 of every three-week Herceptin starting today.  I will see her back in 3 weeks time with CBC prior to cycle #2 of Herceptin.  No labs. Recent MUGA scan from January 2019 was within normal limits  Chemo-induced neutropenia: Resolved.  I will check her labs with every other cycle of Herceptin    Visit Diagnosis 1. Malignant neoplasm of lower-inner quadrant of left breast in female, estrogen receptor positive [place]  2. Encounter for monoclonal antibody treatment for malignancy   3. Chemotherapy induced neutropenia ([facility])      Dr. ArRanda EvensMD,  [facility] Pager- [phone] 04/13/2017 4:12 PM

## 2017-04-24 ENCOUNTER — Telehealth: Payer: Self-pay | Admitting: *Deleted

## 2017-04-24 NOTE — Telephone Encounter (Signed)
Pt had called and left message that she has sharp pains that shoot through her breast. It is the breast that has the cancer but not same side of the breast where the cancer was found.  I did not call her back til today due to in a different location yest. She states that she called her surgeon Dr. Peyton Najjar and he told her it is from surgery to breast from scar tissue and the surgery itself cutting nerves.  If it cont. To bother her she can come see him. She has not had the pain in 2 days.

## 2017-04-25 ENCOUNTER — Ambulatory Visit
Admission: RE | Admit: 2017-04-25 | Discharge: 2017-04-25 | Disposition: A | Discharge status: Home / Self Care | Payer: Medicare HMO | Source: Ambulatory Visit | Attending: Radiation Oncology | Admitting: Radiation Oncology

## 2017-04-25 DIAGNOSIS — D649 Anemia, unspecified: Secondary | ICD-10-CM | POA: Diagnosis not present

## 2017-04-25 DIAGNOSIS — Z171 Estrogen receptor negative status [ER-]: Secondary | ICD-10-CM | POA: Diagnosis not present

## 2017-04-25 DIAGNOSIS — F419 Anxiety disorder, unspecified: Secondary | ICD-10-CM | POA: Diagnosis not present

## 2017-04-25 DIAGNOSIS — Z510 Encounter for antineoplastic radiation therapy: Secondary | ICD-10-CM | POA: Diagnosis not present

## 2017-04-25 DIAGNOSIS — K449 Diaphragmatic hernia without obstruction or gangrene: Secondary | ICD-10-CM | POA: Diagnosis not present

## 2017-04-25 DIAGNOSIS — E7800 Pure hypercholesterolemia, unspecified: Secondary | ICD-10-CM | POA: Diagnosis not present

## 2017-04-25 DIAGNOSIS — K219 Gastro-esophageal reflux disease without esophagitis: Secondary | ICD-10-CM | POA: Diagnosis not present

## 2017-04-25 DIAGNOSIS — J45909 Unspecified asthma, uncomplicated: Secondary | ICD-10-CM | POA: Diagnosis not present

## 2017-04-25 DIAGNOSIS — C50312 Malignant neoplasm of lower-inner quadrant of left female breast: Secondary | ICD-10-CM | POA: Diagnosis not present

## 2017-04-26 DIAGNOSIS — Z171 Estrogen receptor negative status [ER-]: Secondary | ICD-10-CM | POA: Diagnosis not present

## 2017-04-26 DIAGNOSIS — K219 Gastro-esophageal reflux disease without esophagitis: Secondary | ICD-10-CM | POA: Diagnosis not present

## 2017-04-26 DIAGNOSIS — K449 Diaphragmatic hernia without obstruction or gangrene: Secondary | ICD-10-CM | POA: Diagnosis not present

## 2017-04-26 DIAGNOSIS — C50312 Malignant neoplasm of lower-inner quadrant of left female breast: Secondary | ICD-10-CM | POA: Diagnosis not present

## 2017-04-26 DIAGNOSIS — J45909 Unspecified asthma, uncomplicated: Secondary | ICD-10-CM | POA: Diagnosis not present

## 2017-04-26 DIAGNOSIS — D649 Anemia, unspecified: Secondary | ICD-10-CM | POA: Diagnosis not present

## 2017-04-26 DIAGNOSIS — F419 Anxiety disorder, unspecified: Secondary | ICD-10-CM | POA: Diagnosis not present

## 2017-04-26 DIAGNOSIS — Z510 Encounter for antineoplastic radiation therapy: Secondary | ICD-10-CM | POA: Diagnosis not present

## 2017-04-26 DIAGNOSIS — E7800 Pure hypercholesterolemia, unspecified: Secondary | ICD-10-CM | POA: Diagnosis not present

## 2017-05-02 ENCOUNTER — Ambulatory Visit: Payer: Medicare HMO

## 2017-05-03 ENCOUNTER — Ambulatory Visit: Payer: Medicare HMO

## 2017-05-03 ENCOUNTER — Ambulatory Visit
Admission: RE | Admit: 2017-05-03 | Discharge: 2017-05-03 | Disposition: A | Discharge status: Home / Self Care | Payer: Medicare HMO | Source: Ambulatory Visit | Attending: Radiation Oncology | Admitting: Radiation Oncology

## 2017-05-03 DIAGNOSIS — Z510 Encounter for antineoplastic radiation therapy: Secondary | ICD-10-CM | POA: Diagnosis not present

## 2017-05-03 DIAGNOSIS — J45909 Unspecified asthma, uncomplicated: Secondary | ICD-10-CM | POA: Diagnosis not present

## 2017-05-03 DIAGNOSIS — Z171 Estrogen receptor negative status [ER-]: Secondary | ICD-10-CM | POA: Diagnosis not present

## 2017-05-03 DIAGNOSIS — K449 Diaphragmatic hernia without obstruction or gangrene: Secondary | ICD-10-CM | POA: Diagnosis not present

## 2017-05-03 DIAGNOSIS — E7800 Pure hypercholesterolemia, unspecified: Secondary | ICD-10-CM | POA: Diagnosis not present

## 2017-05-03 DIAGNOSIS — F419 Anxiety disorder, unspecified: Secondary | ICD-10-CM | POA: Diagnosis not present

## 2017-05-03 DIAGNOSIS — C50312 Malignant neoplasm of lower-inner quadrant of left female breast: Secondary | ICD-10-CM | POA: Diagnosis not present

## 2017-05-03 DIAGNOSIS — K219 Gastro-esophageal reflux disease without esophagitis: Secondary | ICD-10-CM | POA: Diagnosis not present

## 2017-05-03 DIAGNOSIS — D649 Anemia, unspecified: Secondary | ICD-10-CM | POA: Diagnosis not present

## 2017-05-04 ENCOUNTER — Inpatient Hospital Stay: Payer: Medicare HMO | Attending: Oncology | Admitting: Oncology

## 2017-05-04 ENCOUNTER — Encounter: Payer: Self-pay | Admitting: Oncology

## 2017-05-04 ENCOUNTER — Inpatient Hospital Stay: Payer: Medicare HMO

## 2017-05-04 ENCOUNTER — Ambulatory Visit: Payer: Medicare HMO

## 2017-05-04 VITALS — BP 144/78 | HR 72 | Temp 97.8°F | Resp 18 | Wt 125.0 lb

## 2017-05-04 DIAGNOSIS — Z800 Family history of malignant neoplasm of digestive organs: Secondary | ICD-10-CM | POA: Diagnosis not present

## 2017-05-04 DIAGNOSIS — Z79811 Long term (current) use of aromatase inhibitors: Secondary | ICD-10-CM | POA: Diagnosis not present

## 2017-05-04 DIAGNOSIS — Z801 Family history of malignant neoplasm of trachea, bronchus and lung: Secondary | ICD-10-CM | POA: Diagnosis not present

## 2017-05-04 DIAGNOSIS — Z86010 Personal history of colonic polyps: Secondary | ICD-10-CM | POA: Insufficient documentation

## 2017-05-04 DIAGNOSIS — C50312 Malignant neoplasm of lower-inner quadrant of left female breast: Secondary | ICD-10-CM | POA: Insufficient documentation

## 2017-05-04 DIAGNOSIS — K219 Gastro-esophageal reflux disease without esophagitis: Secondary | ICD-10-CM | POA: Insufficient documentation

## 2017-05-04 DIAGNOSIS — Z8719 Personal history of other diseases of the digestive system: Secondary | ICD-10-CM | POA: Insufficient documentation

## 2017-05-04 DIAGNOSIS — I10 Essential (primary) hypertension: Secondary | ICD-10-CM | POA: Insufficient documentation

## 2017-05-04 DIAGNOSIS — Z5112 Encounter for antineoplastic immunotherapy: Secondary | ICD-10-CM

## 2017-05-04 DIAGNOSIS — Z171 Estrogen receptor negative status [ER-]: Secondary | ICD-10-CM | POA: Insufficient documentation

## 2017-05-04 DIAGNOSIS — F419 Anxiety disorder, unspecified: Secondary | ICD-10-CM | POA: Insufficient documentation

## 2017-05-04 DIAGNOSIS — Z170 Estrogen receptor positive status [ER+]: Principal | ICD-10-CM

## 2017-05-04 DIAGNOSIS — K589 Irritable bowel syndrome without diarrhea: Secondary | ICD-10-CM | POA: Diagnosis not present

## 2017-05-04 DIAGNOSIS — Z8673 Personal history of transient ischemic attack (TIA), and cerebral infarction without residual deficits: Secondary | ICD-10-CM | POA: Insufficient documentation

## 2017-05-04 DIAGNOSIS — E7800 Pure hypercholesterolemia, unspecified: Secondary | ICD-10-CM | POA: Diagnosis not present

## 2017-05-04 DIAGNOSIS — Z79899 Other long term (current) drug therapy: Secondary | ICD-10-CM | POA: Insufficient documentation

## 2017-05-04 DIAGNOSIS — J45909 Unspecified asthma, uncomplicated: Secondary | ICD-10-CM | POA: Insufficient documentation

## 2017-05-04 DIAGNOSIS — D6481 Anemia due to antineoplastic chemotherapy: Secondary | ICD-10-CM | POA: Insufficient documentation

## 2017-05-04 DIAGNOSIS — N3010 Interstitial cystitis (chronic) without hematuria: Secondary | ICD-10-CM | POA: Insufficient documentation

## 2017-05-04 DIAGNOSIS — Z87891 Personal history of nicotine dependence: Secondary | ICD-10-CM | POA: Insufficient documentation

## 2017-05-04 DIAGNOSIS — I341 Nonrheumatic mitral (valve) prolapse: Secondary | ICD-10-CM | POA: Diagnosis not present

## 2017-05-04 DIAGNOSIS — Z9012 Acquired absence of left breast and nipple: Secondary | ICD-10-CM | POA: Diagnosis not present

## 2017-05-04 DIAGNOSIS — I499 Cardiac arrhythmia, unspecified: Secondary | ICD-10-CM | POA: Diagnosis not present

## 2017-05-04 MED ORDER — ANASTROZOLE 1 MG PO TABS: 1.0000 mg | ORAL_TABLET | Freq: Every day | ORAL | 3 refills | Status: DC

## 2017-05-04 MED ORDER — TRASTUZUMAB CHEMO 150 MG IV SOLR
6.0000 mg/kg | Freq: Once | INTRAVENOUS | Status: AC
  Administered 2017-05-04: 336 mg via INTRAVENOUS
  Filled 2017-05-04: qty 16

## 2017-05-04 MED ORDER — DIPHENHYDRAMINE HCL 25 MG PO CAPS
50.0000 mg | ORAL_CAPSULE | Freq: Once | ORAL | Status: AC
  Administered 2017-05-04: 50 mg via ORAL
  Filled 2017-05-04: qty 2

## 2017-05-04 MED ORDER — ACETAMINOPHEN 325 MG PO TABS
650.0000 mg | ORAL_TABLET | Freq: Once | ORAL | Status: AC
  Administered 2017-05-04: 650 mg via ORAL
  Filled 2017-05-04: qty 2

## 2017-05-04 MED ORDER — HEPARIN SOD (PORK) LOCK FLUSH 100 UNIT/ML IV SOLN
500.0000 [IU] | Freq: Once | INTRAVENOUS | Status: AC | PRN
  Administered 2017-05-04: 500 [IU]
  Filled 2017-05-04: qty 5

## 2017-05-04 MED ORDER — SODIUM CHLORIDE 0.9 % IV SOLN
Freq: Once | INTRAVENOUS | Status: AC
  Administered 2017-05-04: 11:00:00 via INTRAVENOUS
  Filled 2017-05-04: qty 1000

## 2017-05-07 ENCOUNTER — Ambulatory Visit
Admission: RE | Admit: 2017-05-07 | Discharge: 2017-05-07 | Disposition: A | Discharge status: Home / Self Care | Payer: Medicare HMO | Source: Ambulatory Visit | Attending: Radiation Oncology | Admitting: Radiation Oncology

## 2017-05-07 DIAGNOSIS — K449 Diaphragmatic hernia without obstruction or gangrene: Secondary | ICD-10-CM | POA: Diagnosis not present

## 2017-05-07 DIAGNOSIS — E7800 Pure hypercholesterolemia, unspecified: Secondary | ICD-10-CM | POA: Diagnosis not present

## 2017-05-07 DIAGNOSIS — K219 Gastro-esophageal reflux disease without esophagitis: Secondary | ICD-10-CM | POA: Diagnosis not present

## 2017-05-07 DIAGNOSIS — Z171 Estrogen receptor negative status [ER-]: Secondary | ICD-10-CM | POA: Diagnosis not present

## 2017-05-07 DIAGNOSIS — Z510 Encounter for antineoplastic radiation therapy: Secondary | ICD-10-CM | POA: Diagnosis not present

## 2017-05-07 DIAGNOSIS — D649 Anemia, unspecified: Secondary | ICD-10-CM | POA: Diagnosis not present

## 2017-05-07 DIAGNOSIS — C50312 Malignant neoplasm of lower-inner quadrant of left female breast: Secondary | ICD-10-CM | POA: Diagnosis not present

## 2017-05-07 DIAGNOSIS — J45909 Unspecified asthma, uncomplicated: Secondary | ICD-10-CM | POA: Diagnosis not present

## 2017-05-07 DIAGNOSIS — F419 Anxiety disorder, unspecified: Secondary | ICD-10-CM | POA: Diagnosis not present

## 2017-05-07 NOTE — Progress Notes (Signed)
Hematology/Oncology Consult note [facility]  [phone] [phone]  Patient Care Team: Rusty Aus, MD as PCP - General (Internal Medicine)   Name of the patient: Doris Barnes  [id number]  [birth year]   Date of visit: 05/07/17  Diagnosis- Stage IA invasive mammary carcinoma of the left breast ER negative, PR weakly positive and HER-2 positive  Chief complaint/ Reason for visit- on treatment assessment prior tocycle 2 of Q3 weekly herceptin   Heme/Onc history:1. Patient is a 77 year old female who underwent bilateral screening mammogram on 11/28/2016 which showed a possible mass and calcifications in her left breast. This was followed by diagnostic mammogram and an ultrasound of the left breast which showed: 1.4 x 1.2 x 1.3 cm irregular mass in the left breast at the 6:30 position 3 cm from the nipple. On magnification views the overall extent of the mass and calcifications measures 1.9 cm. Left axilla was evaluated with ultrasound and showed no enlarged and morphologically abnormal lymph nodes.   2. She underwent core biopsy of the left breast mass which showed: Invasive mammary carcinoma, grade 3. No DCIS or lymphovascular invasion. ER was negative and PR was 11-50% positive and HER-2 was +3 on IHC  3Menarche at the age of 89 years. G5 P5 L5. She did have twoprior biopsiesof her left breast which did not reveal any malignancy. No family history of breast or ovarian cancer. Her sister and maternal grandfather had colon cancer. She is considerably anxious today at the thought of getting chemotherapy. She does have a history of mitral valve prolapse and states that she has "irregular heartbeat"for which she is on atenolol. She had a Holter monitor in the past but was not found to have any arrhythmia.Baseline MUGA scan showed a normal EF of 64.5%  4. Patient underwent lumpectomy on 01/01/2017 which showed invasive mammary carcinoma,  size of tumor was 14 mm, grade 3 with negative margins. 0 out of one lymph nodes was positive for malignancy. There was associated DCIS. Lymphovascular invasion was negative. ER negative, PR 11-50% positive and HER-2/neu +3  5. Adjuvant taxol herceptin chemo started on 01/16/17.Patient had significant fatigue abdominal bloating and Cramping as well as nosebleeds from Taxol and did not wish to continue with Taxol. She received 5 cycles of weekly Taxol. chemotherapy was held for 3 weeks and did not wish to continue with chemotherapy at that time.Trial of Abraxane was given for cycle #9 of treatment which she tolerated very well.   Interval history- patient is doing well. Denies any GI complaints. Energy levels have improved. No skin rash  ECOG PS- 0 Pain scale- 0   Review of systems- Review of Systems  Constitutional: Negative for chills, fever, malaise/fatigue and weight loss.  HENT: Negative for congestion, ear discharge and nosebleeds.   Eyes: Negative for blurred vision.  Respiratory: Negative for cough, hemoptysis, sputum production, shortness of breath and wheezing.   Cardiovascular: Negative for chest pain, palpitations, orthopnea and claudication.  Gastrointestinal: Negative for abdominal pain, blood in stool, constipation, diarrhea, heartburn, melena, nausea and vomiting.  Genitourinary: Negative for dysuria, flank pain, frequency, hematuria and urgency.  Musculoskeletal: Negative for back pain, joint pain and myalgias.  Skin: Negative for rash.  Neurological: Negative for dizziness, tingling, focal weakness, seizures, weakness and headaches.  Endo/Heme/Allergies: Does not bruise/bleed easily.  Psychiatric/Behavioral: Negative for depression and suicidal ideas. The patient does not have insomnia.       Allergies  Allergen Reactions  . Imipramine Pamoate Shortness  Of Breath  . Bisphosphonates Nausea Only  . Epinephrine Other (See Comments)    Difficulty breathing  .  Prednisone Palpitations  . Sulfa Antibiotics Rash    Face turns red and stings     Past Medical History:  Diagnosis Date  . Anemia   . Anxiety   . Asthma   . Breast cancer ([place])   . Cancer ([place]) skin  . Chronic vulvitis   . Colon polyp   . Cystocele   . Diverticulitis   . Diverticulitis   . Diverticulosis   . Diverticulosis   . Dyspareunia, female   . Dyspnea   . Dysrhythmia   . Fibrocystic disease of both breasts   . Gastritis   . GERD (gastroesophageal reflux disease)    gastritis also  . Hemorrhoids   . History of hiatal hernia   . Hypercholesteremia   . Hypertension   . IBS (irritable bowel syndrome)   . IC (interstitial cystitis)   . Migraine   . Mitral valve prolapse   . OP (osteoporosis)   . Positional vertigo   . Rectocele   . Squamous cell carcinoma   . Stroke [facility])    TIA  . Vaginal atrophy      Past Surgical History:  Procedure Laterality Date  . APPENDECTOMY    . BREAST BIOPSY Left 1998   core bx- neg  . BREAST BIOPSY Left 2007   neg  . BREAST BIOPSY Left 12/14/2016   left breast US core path pending  . BREAST EXCISIONAL BIOPSY Left   . BREAST LUMPECTOMY Left 01/01/2017    INVASIVE MAMMARY CARCINOMA/Grade 3  . CARDIAC CATHETERIZATION N/A 01/18/2015   Procedure: Left Heart Cath and Coronary Angiography;  Surgeon: Teodoro Spray, MD;  Location: [facility];  Service: Cardiovascular;  Laterality: N/A;  . CATARACT EXTRACTION W/PHACO Right 12/15/2015   Procedure: CATARACT EXTRACTION PHACO AND INTRAOCULAR LENS PLACEMENT (I[facility]);  Surgeon: Estill Cotta, MD;  Location: [facility];  Service: Ophthalmology;  Laterality: Right;  [place] 01:20 AP% 23.9 CDE 35.49 Fluid pack lot # [id number]  . CATARACT EXTRACTION W/PHACO Left 12/29/2015   Procedure: CATARACT EXTRACTION PHACO AND INTRAOCULAR LENS PLACEMENT ([place]);  Surgeon: Estill Cotta, MD;  Location: [facility];  Service: Ophthalmology;  Laterality: Left;  [place]  01:20 AP% 25.1 CDE 32.86 Fluid pack  lot # [id number]  . COLON SURGERY    . COLONOSCOPY WITH PROPOFOL N/A 02/21/2016   Procedure: COLONOSCOPY WITH PROPOFOL;  Surgeon: Manya Silvas, MD;  Location: [facility];  Service: Endoscopy;  Laterality: N/A;  . CORONARY ANGIOPLASTY    . DILATION AND CURETTAGE OF UTERUS    . OOPHORECTOMY Bilateral   . PARTIAL MASTECTOMY WITH NEEDLE LOCALIZATION Left 01/01/2017   Procedure: PARTIAL MASTECTOMY WITH NEEDLE LOCALIZATION;  Surgeon: Herbert Pun, MD;  Location: [facility] OR[facility];  Service: General;  Laterality: Left;  . PORTACATH PLACEMENT Right 01/01/2017   Procedure: INSERTION PORT-A-CATH;  Surgeon: Herbert Pun, MD;  Location: [facility] OR[facility];  Service: General;  Laterality: Right;  . SENTINEL NODE BIOPSY Left 01/01/2017   Procedure: SENTINEL NODE BIOPSY;  Surgeon: Herbert Pun, MD;  Location: [facility] OR[facility];  Service: General;  Laterality: Left;  . TONSILLECTOMY    . VAGINAL HYSTERECTOMY      Social History   Socioeconomic History  . Marital status: Married    Spouse name: Not on file  . Number of children: Not on file  . Years of education: Not on file  . Highest education level:  Not on file  Social Needs  . Financial resource strain: Not on file  . Food insecurity - worry: Not on file  . Food insecurity - inability: Not on file  . Transportation needs - medical: Not on file  . Transportation needs - non-medical: Not on file  Occupational History  . Not on file  Tobacco Use  . Smoking status: Former Smoker    Packs/day: 1.00    Years: 33.00    Pack years: 33.00    Last attempt to quit: 10/17/1987    Years since quitting: 29.5  . Smokeless tobacco: Never Used  Substance and Sexual Activity  . Alcohol use: No  . Drug use: No  . Sexual activity: Yes  Other Topics Concern  . Not on file  Social History Narrative  . Not on file    Family History  Problem Relation Age of Onset  . Diabetes Mother   . Diabetes Father   . Colon cancer Sister   . Diabetes Sister     . Diabetes Maternal Aunt   . Colon cancer Paternal Grandfather   . Lung cancer Brother   . Breast cancer Neg Hx   . Ovarian cancer Neg Hx      Current Outpatient Medications:  .  acetaminophen (TYLENOL) 325 MG tablet, Take 650 mg by mouth every 6 (six) hours as needed (for headaches.). , Disp: , Rfl:  .  ALPRAZolam (XANAX) 0.25 MG tablet, Take 0.125-0.25 mg by mouth 2 (two) times daily as needed for anxiety or sleep. , Disp: , Rfl:  .  aspirin EC 325 MG tablet, Take 325 mg by mouth daily with breakfast. , Disp: , Rfl:  .  atenolol (TENORMIN) 50 MG tablet, Take 50 mg by mouth daily with breakfast. , Disp: , Rfl:  .  calcium citrate-vitamin D (CITRACAL+D) 315-200 MG-UNIT tablet, Take 1 tablet by mouth 2 (two) times daily., Disp: , Rfl:  .  dicyclomine (BENTYL) 20 MG tablet, Take 1 tablet (20 mg total) by mouth 3 (three) times daily as needed for spasms., Disp: 30 tablet, Rfl: 0 .  diphenhydrAMINE (BENADRYL) 25 MG tablet, Take 12.5 mg by mouth every 6 (six) hours as needed for itching (from rash)., Disp: , Rfl:  .  FLUoxetine (PROZAC) 40 MG capsule, Take 40 mg by mouth at bedtime., Disp: , Rfl:  .  fluticasone (FLONASE) 50 MCG/ACT nasal spray, Place 2 sprays into both nostrils daily as needed for allergies. , Disp: , Rfl:  .  hydrochlorothiazide (HYDRODIURIL) 25 MG tablet, Take 25 mg by mouth daily. , Disp: , Rfl:  .  lansoprazole (PREVACID) 30 MG capsule, Take 30 mg by mouth daily before breakfast. , Disp: , Rfl:  .  loratadine (CLARITIN) 10 MG tablet, Take 10 mg by mouth daily as needed for allergies. , Disp: , Rfl:  .  Magnesium 250 MG TABS, Take 250 mg by mouth daily., Disp: , Rfl:  .  Multiple Vitamins-Minerals (PRESERVISION AREDS 2) CAPS, Take 1 capsule by mouth 2 (two) times daily. , Disp: , Rfl:  .  polyethylene glycol (MIRALAX / GLYCOLAX) packet, Take 17 g daily by mouth., Disp: , Rfl:  .  simethicone (GAS-X) 80 MG chewable tablet, Chew 1 tablet (80 mg total) every 6 (six) hours as  needed by mouth for flatulence., Disp: 30 tablet, Rfl: 0 .  simvastatin (ZOCOR) 20 MG tablet, Take 20 mg by mouth daily at 8 pm., Disp: , Rfl:  .  telmisartan (MICARDIS) 80 MG tablet,  Take 40 mg by mouth daily with breakfast., Disp: , Rfl:  .  triamcinolone ointment (KENALOG) 0.5 %, Apply 1 application topically 2 (two) times daily., Disp: 30 g, Rfl: 0 .  anastrozole (ARIMIDEX) 1 MG tablet, Take 1 tablet (1 mg total) by mouth daily., Disp: 30 tablet, Rfl: 3  Physical exam:  Vitals:   05/04/17 1002  BP: (!) 144/78  Pulse: 72  Resp: 18  Temp: 97.8 F (36.6 C)  TempSrc: Tympanic  Weight: 125 lb (56.7 kg)   Physical Exam  Constitutional: She is oriented to person, place, and time and well-developed, well-nourished, and in no distress.  HENT:  Head: Normocephalic and atraumatic.  Eyes: EOM are normal. Pupils are equal, round, and reactive to light.  Neck: Normal range of motion.  Cardiovascular: Normal rate, regular rhythm and normal heart sounds.  Pulmonary/Chest: Effort normal and breath sounds normal.  Abdominal: Soft. Bowel sounds are normal.  Neurological: She is alert and oriented to person, place, and time.  Skin: Skin is warm and dry.     CMP Latest Ref Rng & Units 04/13/2017  Glucose 65 - 99 mg/dL 108(H)  BUN 6 - 20 mg/dL 11  Creatinine 0.44 - 1.00 mg/dL 0.88  Sodium 135 - 145 mmol/L 134(L)  Potassium 3.5 - 5.1 mmol/L 3.9  Chloride 101 - 111 mmol/L 100(L)  CO2 22 - 32 mmol/L 27  Calcium 8.9 - 10.3 mg/dL 8.6(L)  Total Protein 6.5 - 8.1 g/dL 6.3(L)  Total Bilirubin 0.3 - 1.2 mg/dL 0.5  Alkaline Phos 38 - 126 U/L 71  AST 15 - 41 U/L 28  ALT 14 - 54 U/L 23   CBC Latest Ref Rng & Units 04/13/2017  WBC 3.6 - 11.0 K/uL 4.4  Hemoglobin 12.0 - 16.0 g/dL 10.8(L)  Hematocrit 35.0 - 47.0 % 31.8(L)  Platelets 150 - 440 K/uL 276     Assessment and plan- Patient is a 77 y.o. female pathologicalprognostic stage IA pT1c1p N0 cM0invasive mammary carcinoma of the left breast ER  negative PR positive and HER-2 +3 on IHC s/p lumpectomy s/p adjuvant chemotherapy here for on treatment assessment prior to cycle 2 of Q3 weekly heceptin adjuvant.  Counts are okay to proceed with the next cycle of Herceptin today.  Recent MUGA scan in January 2019 revealed a normal EF.  Monitor echo every 3 months.  Patient also has mild chemo-induced anemia is currently stable and likely to improve with time.  I will see her back in 3 weeks time prior to the next cycle of Herceptin.  Patient completes 1 year of Herceptin treatment in October 2019    Visit Diagnosis 1. Malignant neoplasm of lower-inner quadrant of left breast in female, estrogen receptor positive ([place])   2. Encounter for monoclonal antibody treatment for malignancy      Dr. Randa Evens, MD, MPH [facility] Pager- [phone] 05/07/2017 12:23 PM

## 2017-05-08 ENCOUNTER — Ambulatory Visit
Admission: RE | Admit: 2017-05-08 | Discharge: 2017-05-08 | Disposition: A | Discharge status: Home / Self Care | Payer: Medicare HMO | Source: Ambulatory Visit | Attending: Radiation Oncology | Admitting: Radiation Oncology

## 2017-05-08 DIAGNOSIS — Z171 Estrogen receptor negative status [ER-]: Secondary | ICD-10-CM | POA: Diagnosis not present

## 2017-05-08 DIAGNOSIS — Z510 Encounter for antineoplastic radiation therapy: Secondary | ICD-10-CM | POA: Diagnosis not present

## 2017-05-08 DIAGNOSIS — C50312 Malignant neoplasm of lower-inner quadrant of left female breast: Secondary | ICD-10-CM | POA: Diagnosis not present

## 2017-05-08 DIAGNOSIS — D649 Anemia, unspecified: Secondary | ICD-10-CM | POA: Diagnosis not present

## 2017-05-08 DIAGNOSIS — K449 Diaphragmatic hernia without obstruction or gangrene: Secondary | ICD-10-CM | POA: Diagnosis not present

## 2017-05-08 DIAGNOSIS — F419 Anxiety disorder, unspecified: Secondary | ICD-10-CM | POA: Diagnosis not present

## 2017-05-08 DIAGNOSIS — J45909 Unspecified asthma, uncomplicated: Secondary | ICD-10-CM | POA: Diagnosis not present

## 2017-05-08 DIAGNOSIS — E7800 Pure hypercholesterolemia, unspecified: Secondary | ICD-10-CM | POA: Diagnosis not present

## 2017-05-08 DIAGNOSIS — K219 Gastro-esophageal reflux disease without esophagitis: Secondary | ICD-10-CM | POA: Diagnosis not present

## 2017-05-09 ENCOUNTER — Ambulatory Visit
Admission: RE | Admit: 2017-05-09 | Discharge: 2017-05-09 | Disposition: A | Discharge status: Home / Self Care | Payer: Medicare HMO | Source: Ambulatory Visit | Attending: Radiation Oncology | Admitting: Radiation Oncology

## 2017-05-09 DIAGNOSIS — K449 Diaphragmatic hernia without obstruction or gangrene: Secondary | ICD-10-CM | POA: Diagnosis not present

## 2017-05-09 DIAGNOSIS — F419 Anxiety disorder, unspecified: Secondary | ICD-10-CM | POA: Diagnosis not present

## 2017-05-09 DIAGNOSIS — Z171 Estrogen receptor negative status [ER-]: Secondary | ICD-10-CM | POA: Diagnosis not present

## 2017-05-09 DIAGNOSIS — K219 Gastro-esophageal reflux disease without esophagitis: Secondary | ICD-10-CM | POA: Diagnosis not present

## 2017-05-09 DIAGNOSIS — Z510 Encounter for antineoplastic radiation therapy: Secondary | ICD-10-CM | POA: Diagnosis not present

## 2017-05-09 DIAGNOSIS — J45909 Unspecified asthma, uncomplicated: Secondary | ICD-10-CM | POA: Diagnosis not present

## 2017-05-09 DIAGNOSIS — E7800 Pure hypercholesterolemia, unspecified: Secondary | ICD-10-CM | POA: Diagnosis not present

## 2017-05-09 DIAGNOSIS — D649 Anemia, unspecified: Secondary | ICD-10-CM | POA: Diagnosis not present

## 2017-05-09 DIAGNOSIS — C50312 Malignant neoplasm of lower-inner quadrant of left female breast: Secondary | ICD-10-CM | POA: Diagnosis not present

## 2017-05-10 ENCOUNTER — Ambulatory Visit
Admission: RE | Admit: 2017-05-10 | Discharge: 2017-05-10 | Disposition: A | Discharge status: Home / Self Care | Payer: Medicare HMO | Source: Ambulatory Visit | Attending: Radiation Oncology | Admitting: Radiation Oncology

## 2017-05-10 DIAGNOSIS — F419 Anxiety disorder, unspecified: Secondary | ICD-10-CM | POA: Diagnosis not present

## 2017-05-10 DIAGNOSIS — J45909 Unspecified asthma, uncomplicated: Secondary | ICD-10-CM | POA: Diagnosis not present

## 2017-05-10 DIAGNOSIS — Z171 Estrogen receptor negative status [ER-]: Secondary | ICD-10-CM | POA: Diagnosis not present

## 2017-05-10 DIAGNOSIS — K219 Gastro-esophageal reflux disease without esophagitis: Secondary | ICD-10-CM | POA: Diagnosis not present

## 2017-05-10 DIAGNOSIS — C50312 Malignant neoplasm of lower-inner quadrant of left female breast: Secondary | ICD-10-CM | POA: Diagnosis not present

## 2017-05-10 DIAGNOSIS — Z510 Encounter for antineoplastic radiation therapy: Secondary | ICD-10-CM | POA: Diagnosis not present

## 2017-05-10 DIAGNOSIS — D649 Anemia, unspecified: Secondary | ICD-10-CM | POA: Diagnosis not present

## 2017-05-10 DIAGNOSIS — E7800 Pure hypercholesterolemia, unspecified: Secondary | ICD-10-CM | POA: Diagnosis not present

## 2017-05-10 DIAGNOSIS — K449 Diaphragmatic hernia without obstruction or gangrene: Secondary | ICD-10-CM | POA: Diagnosis not present

## 2017-05-11 ENCOUNTER — Ambulatory Visit
Admission: RE | Admit: 2017-05-11 | Discharge: 2017-05-11 | Disposition: A | Discharge status: Home / Self Care | Payer: Medicare HMO | Source: Ambulatory Visit | Attending: Radiation Oncology | Admitting: Radiation Oncology

## 2017-05-11 DIAGNOSIS — K449 Diaphragmatic hernia without obstruction or gangrene: Secondary | ICD-10-CM | POA: Diagnosis not present

## 2017-05-11 DIAGNOSIS — J45909 Unspecified asthma, uncomplicated: Secondary | ICD-10-CM | POA: Diagnosis not present

## 2017-05-11 DIAGNOSIS — K219 Gastro-esophageal reflux disease without esophagitis: Secondary | ICD-10-CM | POA: Diagnosis not present

## 2017-05-11 DIAGNOSIS — D649 Anemia, unspecified: Secondary | ICD-10-CM | POA: Diagnosis not present

## 2017-05-11 DIAGNOSIS — E7800 Pure hypercholesterolemia, unspecified: Secondary | ICD-10-CM | POA: Diagnosis not present

## 2017-05-11 DIAGNOSIS — Z171 Estrogen receptor negative status [ER-]: Secondary | ICD-10-CM | POA: Diagnosis not present

## 2017-05-11 DIAGNOSIS — Z510 Encounter for antineoplastic radiation therapy: Secondary | ICD-10-CM | POA: Diagnosis not present

## 2017-05-11 DIAGNOSIS — F419 Anxiety disorder, unspecified: Secondary | ICD-10-CM | POA: Diagnosis not present

## 2017-05-11 DIAGNOSIS — C50312 Malignant neoplasm of lower-inner quadrant of left female breast: Secondary | ICD-10-CM | POA: Diagnosis not present

## 2017-05-14 ENCOUNTER — Ambulatory Visit
Admission: RE | Admit: 2017-05-14 | Discharge: 2017-05-14 | Disposition: A | Discharge status: Home / Self Care | Payer: Medicare HMO | Source: Ambulatory Visit | Attending: Radiation Oncology | Admitting: Radiation Oncology

## 2017-05-14 DIAGNOSIS — J45909 Unspecified asthma, uncomplicated: Secondary | ICD-10-CM | POA: Diagnosis not present

## 2017-05-14 DIAGNOSIS — K219 Gastro-esophageal reflux disease without esophagitis: Secondary | ICD-10-CM | POA: Diagnosis not present

## 2017-05-14 DIAGNOSIS — C50312 Malignant neoplasm of lower-inner quadrant of left female breast: Secondary | ICD-10-CM | POA: Diagnosis not present

## 2017-05-14 DIAGNOSIS — F419 Anxiety disorder, unspecified: Secondary | ICD-10-CM | POA: Diagnosis not present

## 2017-05-14 DIAGNOSIS — E7800 Pure hypercholesterolemia, unspecified: Secondary | ICD-10-CM | POA: Diagnosis not present

## 2017-05-14 DIAGNOSIS — D649 Anemia, unspecified: Secondary | ICD-10-CM | POA: Diagnosis not present

## 2017-05-14 DIAGNOSIS — Z510 Encounter for antineoplastic radiation therapy: Secondary | ICD-10-CM | POA: Diagnosis not present

## 2017-05-14 DIAGNOSIS — Z171 Estrogen receptor negative status [ER-]: Secondary | ICD-10-CM | POA: Diagnosis not present

## 2017-05-14 DIAGNOSIS — K449 Diaphragmatic hernia without obstruction or gangrene: Secondary | ICD-10-CM | POA: Diagnosis not present

## 2017-05-15 ENCOUNTER — Ambulatory Visit
Admission: RE | Admit: 2017-05-15 | Discharge: 2017-05-15 | Disposition: A | Discharge status: Home / Self Care | Payer: Medicare HMO | Source: Ambulatory Visit | Attending: Radiation Oncology | Admitting: Radiation Oncology

## 2017-05-15 DIAGNOSIS — F419 Anxiety disorder, unspecified: Secondary | ICD-10-CM | POA: Diagnosis not present

## 2017-05-15 DIAGNOSIS — E7800 Pure hypercholesterolemia, unspecified: Secondary | ICD-10-CM | POA: Diagnosis not present

## 2017-05-15 DIAGNOSIS — Z171 Estrogen receptor negative status [ER-]: Secondary | ICD-10-CM | POA: Diagnosis not present

## 2017-05-15 DIAGNOSIS — K449 Diaphragmatic hernia without obstruction or gangrene: Secondary | ICD-10-CM | POA: Diagnosis not present

## 2017-05-15 DIAGNOSIS — D649 Anemia, unspecified: Secondary | ICD-10-CM | POA: Diagnosis not present

## 2017-05-15 DIAGNOSIS — J45909 Unspecified asthma, uncomplicated: Secondary | ICD-10-CM | POA: Diagnosis not present

## 2017-05-15 DIAGNOSIS — K219 Gastro-esophageal reflux disease without esophagitis: Secondary | ICD-10-CM | POA: Diagnosis not present

## 2017-05-15 DIAGNOSIS — C50312 Malignant neoplasm of lower-inner quadrant of left female breast: Secondary | ICD-10-CM | POA: Diagnosis not present

## 2017-05-15 DIAGNOSIS — Z510 Encounter for antineoplastic radiation therapy: Secondary | ICD-10-CM | POA: Diagnosis not present

## 2017-05-16 ENCOUNTER — Ambulatory Visit
Admission: RE | Admit: 2017-05-16 | Discharge: 2017-05-16 | Disposition: A | Discharge status: Home / Self Care | Payer: Medicare HMO | Source: Ambulatory Visit | Attending: Radiation Oncology | Admitting: Radiation Oncology

## 2017-05-16 DIAGNOSIS — E7800 Pure hypercholesterolemia, unspecified: Secondary | ICD-10-CM | POA: Diagnosis not present

## 2017-05-16 DIAGNOSIS — K219 Gastro-esophageal reflux disease without esophagitis: Secondary | ICD-10-CM | POA: Diagnosis not present

## 2017-05-16 DIAGNOSIS — K449 Diaphragmatic hernia without obstruction or gangrene: Secondary | ICD-10-CM | POA: Diagnosis not present

## 2017-05-16 DIAGNOSIS — Z171 Estrogen receptor negative status [ER-]: Secondary | ICD-10-CM | POA: Diagnosis not present

## 2017-05-16 DIAGNOSIS — J45909 Unspecified asthma, uncomplicated: Secondary | ICD-10-CM | POA: Diagnosis not present

## 2017-05-16 DIAGNOSIS — D649 Anemia, unspecified: Secondary | ICD-10-CM | POA: Diagnosis not present

## 2017-05-16 DIAGNOSIS — C50312 Malignant neoplasm of lower-inner quadrant of left female breast: Secondary | ICD-10-CM | POA: Diagnosis not present

## 2017-05-16 DIAGNOSIS — Z510 Encounter for antineoplastic radiation therapy: Secondary | ICD-10-CM | POA: Diagnosis not present

## 2017-05-16 DIAGNOSIS — F419 Anxiety disorder, unspecified: Secondary | ICD-10-CM | POA: Diagnosis not present

## 2017-05-17 ENCOUNTER — Ambulatory Visit
Admission: RE | Admit: 2017-05-17 | Discharge: 2017-05-17 | Disposition: A | Discharge status: Home / Self Care | Payer: Medicare HMO | Source: Ambulatory Visit | Attending: Radiation Oncology | Admitting: Radiation Oncology

## 2017-05-17 ENCOUNTER — Telehealth: Payer: Self-pay | Admitting: Gastroenterology

## 2017-05-17 DIAGNOSIS — K449 Diaphragmatic hernia without obstruction or gangrene: Secondary | ICD-10-CM | POA: Diagnosis not present

## 2017-05-17 DIAGNOSIS — K219 Gastro-esophageal reflux disease without esophagitis: Secondary | ICD-10-CM | POA: Diagnosis not present

## 2017-05-17 DIAGNOSIS — Z171 Estrogen receptor negative status [ER-]: Secondary | ICD-10-CM | POA: Diagnosis not present

## 2017-05-17 DIAGNOSIS — J45909 Unspecified asthma, uncomplicated: Secondary | ICD-10-CM | POA: Diagnosis not present

## 2017-05-17 DIAGNOSIS — D649 Anemia, unspecified: Secondary | ICD-10-CM | POA: Diagnosis not present

## 2017-05-17 DIAGNOSIS — F419 Anxiety disorder, unspecified: Secondary | ICD-10-CM | POA: Diagnosis not present

## 2017-05-17 DIAGNOSIS — Z510 Encounter for antineoplastic radiation therapy: Secondary | ICD-10-CM | POA: Diagnosis not present

## 2017-05-17 DIAGNOSIS — C50312 Malignant neoplasm of lower-inner quadrant of left female breast: Secondary | ICD-10-CM | POA: Diagnosis not present

## 2017-05-17 DIAGNOSIS — E7800 Pure hypercholesterolemia, unspecified: Secondary | ICD-10-CM | POA: Diagnosis not present

## 2017-05-17 NOTE — Telephone Encounter (Signed)
LVM to call our office regarding a recall appt.

## 2017-05-18 ENCOUNTER — Ambulatory Visit
Admission: RE | Admit: 2017-05-18 | Discharge: 2017-05-18 | Disposition: A | Discharge status: Home / Self Care | Payer: Medicare HMO | Source: Ambulatory Visit | Attending: Radiation Oncology | Admitting: Radiation Oncology

## 2017-05-18 DIAGNOSIS — Z171 Estrogen receptor negative status [ER-]: Secondary | ICD-10-CM | POA: Diagnosis not present

## 2017-05-18 DIAGNOSIS — Z510 Encounter for antineoplastic radiation therapy: Secondary | ICD-10-CM | POA: Diagnosis not present

## 2017-05-18 DIAGNOSIS — C50312 Malignant neoplasm of lower-inner quadrant of left female breast: Secondary | ICD-10-CM | POA: Diagnosis not present

## 2017-05-18 DIAGNOSIS — J45909 Unspecified asthma, uncomplicated: Secondary | ICD-10-CM | POA: Diagnosis not present

## 2017-05-18 DIAGNOSIS — K449 Diaphragmatic hernia without obstruction or gangrene: Secondary | ICD-10-CM | POA: Diagnosis not present

## 2017-05-18 DIAGNOSIS — F419 Anxiety disorder, unspecified: Secondary | ICD-10-CM | POA: Diagnosis not present

## 2017-05-18 DIAGNOSIS — D649 Anemia, unspecified: Secondary | ICD-10-CM | POA: Diagnosis not present

## 2017-05-18 DIAGNOSIS — E7800 Pure hypercholesterolemia, unspecified: Secondary | ICD-10-CM | POA: Diagnosis not present

## 2017-05-18 DIAGNOSIS — K219 Gastro-esophageal reflux disease without esophagitis: Secondary | ICD-10-CM | POA: Diagnosis not present

## 2017-05-21 ENCOUNTER — Ambulatory Visit
Admission: RE | Admit: 2017-05-21 | Discharge: 2017-05-21 | Disposition: A | Discharge status: Home / Self Care | Payer: Medicare HMO | Source: Ambulatory Visit | Attending: Radiation Oncology | Admitting: Radiation Oncology

## 2017-05-21 DIAGNOSIS — F419 Anxiety disorder, unspecified: Secondary | ICD-10-CM | POA: Diagnosis not present

## 2017-05-21 DIAGNOSIS — Z171 Estrogen receptor negative status [ER-]: Secondary | ICD-10-CM | POA: Diagnosis not present

## 2017-05-21 DIAGNOSIS — M810 Age-related osteoporosis without current pathological fracture: Secondary | ICD-10-CM | POA: Diagnosis not present

## 2017-05-21 DIAGNOSIS — Z510 Encounter for antineoplastic radiation therapy: Secondary | ICD-10-CM | POA: Diagnosis not present

## 2017-05-21 DIAGNOSIS — K449 Diaphragmatic hernia without obstruction or gangrene: Secondary | ICD-10-CM | POA: Diagnosis not present

## 2017-05-21 DIAGNOSIS — J45909 Unspecified asthma, uncomplicated: Secondary | ICD-10-CM | POA: Diagnosis not present

## 2017-05-21 DIAGNOSIS — K219 Gastro-esophageal reflux disease without esophagitis: Secondary | ICD-10-CM | POA: Diagnosis not present

## 2017-05-21 DIAGNOSIS — E782 Mixed hyperlipidemia: Secondary | ICD-10-CM | POA: Diagnosis not present

## 2017-05-21 DIAGNOSIS — C50312 Malignant neoplasm of lower-inner quadrant of left female breast: Secondary | ICD-10-CM | POA: Diagnosis not present

## 2017-05-21 DIAGNOSIS — E7800 Pure hypercholesterolemia, unspecified: Secondary | ICD-10-CM | POA: Diagnosis not present

## 2017-05-21 DIAGNOSIS — D649 Anemia, unspecified: Secondary | ICD-10-CM | POA: Diagnosis not present

## 2017-05-22 ENCOUNTER — Ambulatory Visit
Admission: RE | Admit: 2017-05-22 | Discharge: 2017-05-22 | Disposition: A | Discharge status: Home / Self Care | Payer: Medicare HMO | Source: Ambulatory Visit | Attending: Radiation Oncology | Admitting: Radiation Oncology

## 2017-05-22 DIAGNOSIS — E7800 Pure hypercholesterolemia, unspecified: Secondary | ICD-10-CM | POA: Diagnosis not present

## 2017-05-22 DIAGNOSIS — F419 Anxiety disorder, unspecified: Secondary | ICD-10-CM | POA: Diagnosis not present

## 2017-05-22 DIAGNOSIS — K219 Gastro-esophageal reflux disease without esophagitis: Secondary | ICD-10-CM | POA: Diagnosis not present

## 2017-05-22 DIAGNOSIS — Z510 Encounter for antineoplastic radiation therapy: Secondary | ICD-10-CM | POA: Diagnosis not present

## 2017-05-22 DIAGNOSIS — D649 Anemia, unspecified: Secondary | ICD-10-CM | POA: Diagnosis not present

## 2017-05-22 DIAGNOSIS — K449 Diaphragmatic hernia without obstruction or gangrene: Secondary | ICD-10-CM | POA: Diagnosis not present

## 2017-05-22 DIAGNOSIS — C50312 Malignant neoplasm of lower-inner quadrant of left female breast: Secondary | ICD-10-CM | POA: Diagnosis not present

## 2017-05-22 DIAGNOSIS — J45909 Unspecified asthma, uncomplicated: Secondary | ICD-10-CM | POA: Diagnosis not present

## 2017-05-22 DIAGNOSIS — Z171 Estrogen receptor negative status [ER-]: Secondary | ICD-10-CM | POA: Diagnosis not present

## 2017-05-23 ENCOUNTER — Ambulatory Visit
Admission: RE | Admit: 2017-05-23 | Discharge: 2017-05-23 | Disposition: A | Discharge status: Home / Self Care | Payer: Medicare HMO | Source: Ambulatory Visit | Attending: Radiation Oncology | Admitting: Radiation Oncology

## 2017-05-23 ENCOUNTER — Other Ambulatory Visit: Payer: Self-pay | Admitting: *Deleted

## 2017-05-23 DIAGNOSIS — K449 Diaphragmatic hernia without obstruction or gangrene: Secondary | ICD-10-CM | POA: Diagnosis not present

## 2017-05-23 DIAGNOSIS — IMO0002 Reserved for concepts with insufficient information to code with codable children: Secondary | ICD-10-CM

## 2017-05-23 DIAGNOSIS — Z171 Estrogen receptor negative status [ER-]: Secondary | ICD-10-CM | POA: Diagnosis not present

## 2017-05-23 DIAGNOSIS — C50312 Malignant neoplasm of lower-inner quadrant of left female breast: Secondary | ICD-10-CM | POA: Diagnosis not present

## 2017-05-23 DIAGNOSIS — F419 Anxiety disorder, unspecified: Secondary | ICD-10-CM | POA: Diagnosis not present

## 2017-05-23 DIAGNOSIS — Z510 Encounter for antineoplastic radiation therapy: Secondary | ICD-10-CM | POA: Diagnosis not present

## 2017-05-23 DIAGNOSIS — K219 Gastro-esophageal reflux disease without esophagitis: Secondary | ICD-10-CM | POA: Diagnosis not present

## 2017-05-23 DIAGNOSIS — J45909 Unspecified asthma, uncomplicated: Secondary | ICD-10-CM | POA: Diagnosis not present

## 2017-05-23 DIAGNOSIS — D649 Anemia, unspecified: Secondary | ICD-10-CM | POA: Diagnosis not present

## 2017-05-23 DIAGNOSIS — E7800 Pure hypercholesterolemia, unspecified: Secondary | ICD-10-CM | POA: Diagnosis not present

## 2017-05-24 ENCOUNTER — Ambulatory Visit
Admission: RE | Admit: 2017-05-24 | Discharge: 2017-05-24 | Disposition: A | Discharge status: Home / Self Care | Payer: Medicare HMO | Source: Ambulatory Visit | Attending: Radiation Oncology | Admitting: Radiation Oncology

## 2017-05-24 DIAGNOSIS — D649 Anemia, unspecified: Secondary | ICD-10-CM | POA: Diagnosis not present

## 2017-05-24 DIAGNOSIS — J45909 Unspecified asthma, uncomplicated: Secondary | ICD-10-CM | POA: Diagnosis not present

## 2017-05-24 DIAGNOSIS — C50312 Malignant neoplasm of lower-inner quadrant of left female breast: Secondary | ICD-10-CM | POA: Diagnosis not present

## 2017-05-24 DIAGNOSIS — Z171 Estrogen receptor negative status [ER-]: Secondary | ICD-10-CM | POA: Diagnosis not present

## 2017-05-24 DIAGNOSIS — Z510 Encounter for antineoplastic radiation therapy: Secondary | ICD-10-CM | POA: Diagnosis not present

## 2017-05-24 DIAGNOSIS — F419 Anxiety disorder, unspecified: Secondary | ICD-10-CM | POA: Diagnosis not present

## 2017-05-24 DIAGNOSIS — E7800 Pure hypercholesterolemia, unspecified: Secondary | ICD-10-CM | POA: Diagnosis not present

## 2017-05-24 DIAGNOSIS — K219 Gastro-esophageal reflux disease without esophagitis: Secondary | ICD-10-CM | POA: Diagnosis not present

## 2017-05-24 DIAGNOSIS — K449 Diaphragmatic hernia without obstruction or gangrene: Secondary | ICD-10-CM | POA: Diagnosis not present

## 2017-05-25 ENCOUNTER — Inpatient Hospital Stay: Payer: Medicare HMO | Attending: Oncology

## 2017-05-25 ENCOUNTER — Inpatient Hospital Stay (HOSPITAL_BASED_OUTPATIENT_CLINIC_OR_DEPARTMENT_OTHER): Payer: Medicare HMO | Admitting: Oncology

## 2017-05-25 ENCOUNTER — Inpatient Hospital Stay: Payer: Medicare HMO

## 2017-05-25 ENCOUNTER — Encounter: Payer: Self-pay | Admitting: Oncology

## 2017-05-25 ENCOUNTER — Ambulatory Visit: Payer: Medicare HMO

## 2017-05-25 ENCOUNTER — Ambulatory Visit
Admission: RE | Admit: 2017-05-25 | Discharge: 2017-05-25 | Disposition: A | Discharge status: Home / Self Care | Payer: Medicare HMO | Source: Ambulatory Visit | Attending: Radiation Oncology | Admitting: Radiation Oncology

## 2017-05-25 VITALS — BP 134/80 | HR 65 | Temp 97.5°F | Resp 18 | Wt 124.4 lb

## 2017-05-25 DIAGNOSIS — J45909 Unspecified asthma, uncomplicated: Secondary | ICD-10-CM | POA: Diagnosis not present

## 2017-05-25 DIAGNOSIS — Z170 Estrogen receptor positive status [ER+]: Principal | ICD-10-CM

## 2017-05-25 DIAGNOSIS — Z79899 Other long term (current) drug therapy: Secondary | ICD-10-CM

## 2017-05-25 DIAGNOSIS — I341 Nonrheumatic mitral (valve) prolapse: Secondary | ICD-10-CM

## 2017-05-25 DIAGNOSIS — I10 Essential (primary) hypertension: Secondary | ICD-10-CM | POA: Insufficient documentation

## 2017-05-25 DIAGNOSIS — Z8673 Personal history of transient ischemic attack (TIA), and cerebral infarction without residual deficits: Secondary | ICD-10-CM

## 2017-05-25 DIAGNOSIS — Z9012 Acquired absence of left breast and nipple: Secondary | ICD-10-CM | POA: Insufficient documentation

## 2017-05-25 DIAGNOSIS — Z8719 Personal history of other diseases of the digestive system: Secondary | ICD-10-CM | POA: Insufficient documentation

## 2017-05-25 DIAGNOSIS — Z171 Estrogen receptor negative status [ER-]: Secondary | ICD-10-CM | POA: Diagnosis not present

## 2017-05-25 DIAGNOSIS — D649 Anemia, unspecified: Secondary | ICD-10-CM | POA: Diagnosis not present

## 2017-05-25 DIAGNOSIS — Z87891 Personal history of nicotine dependence: Secondary | ICD-10-CM

## 2017-05-25 DIAGNOSIS — Z86010 Personal history of colonic polyps: Secondary | ICD-10-CM

## 2017-05-25 DIAGNOSIS — F419 Anxiety disorder, unspecified: Secondary | ICD-10-CM | POA: Diagnosis not present

## 2017-05-25 DIAGNOSIS — C50512 Malignant neoplasm of lower-outer quadrant of left female breast: Secondary | ICD-10-CM | POA: Diagnosis not present

## 2017-05-25 DIAGNOSIS — Z7982 Long term (current) use of aspirin: Secondary | ICD-10-CM

## 2017-05-25 DIAGNOSIS — C50312 Malignant neoplasm of lower-inner quadrant of left female breast: Secondary | ICD-10-CM | POA: Insufficient documentation

## 2017-05-25 DIAGNOSIS — E7800 Pure hypercholesterolemia, unspecified: Secondary | ICD-10-CM | POA: Insufficient documentation

## 2017-05-25 DIAGNOSIS — Z5112 Encounter for antineoplastic immunotherapy: Secondary | ICD-10-CM | POA: Diagnosis not present

## 2017-05-25 DIAGNOSIS — K219 Gastro-esophageal reflux disease without esophagitis: Secondary | ICD-10-CM | POA: Diagnosis not present

## 2017-05-25 DIAGNOSIS — N941 Unspecified dyspareunia: Secondary | ICD-10-CM | POA: Insufficient documentation

## 2017-05-25 DIAGNOSIS — Z79811 Long term (current) use of aromatase inhibitors: Secondary | ICD-10-CM | POA: Diagnosis not present

## 2017-05-25 DIAGNOSIS — Z510 Encounter for antineoplastic radiation therapy: Secondary | ICD-10-CM | POA: Insufficient documentation

## 2017-05-25 DIAGNOSIS — IMO0002 Reserved for concepts with insufficient information to code with codable children: Secondary | ICD-10-CM

## 2017-05-25 DIAGNOSIS — M810 Age-related osteoporosis without current pathological fracture: Secondary | ICD-10-CM

## 2017-05-25 LAB — COMPREHENSIVE METABOLIC PANEL
ALT: 19 U/L (ref 14–54)
AST: 28 U/L (ref 15–41)
Albumin: 3.5 g/dL (ref 3.5–5.0)
Alkaline Phosphatase: 69 U/L (ref 38–126)
Anion gap: 7 (ref 5–15)
BUN: 12 mg/dL (ref 6–20)
CO2: 27 mmol/L (ref 22–32)
Calcium: 9 mg/dL (ref 8.9–10.3)
Chloride: 99 mmol/L — ABNORMAL LOW (ref 101–111)
Creatinine, Ser: 0.65 mg/dL (ref 0.44–1.00)
GFR calc Af Amer: >60 mL/min (ref >60)
GFR calc non Af Amer: >60 mL/min (ref >60)
Glucose, Bld: 139 mg/dL — ABNORMAL HIGH (ref 65–99)
Potassium: 3.7 mmol/L (ref 3.5–5.1)
Sodium: 133 mmol/L — ABNORMAL LOW (ref 135–145)
Total Bilirubin: 0.6 mg/dL (ref 0.3–1.2)
Total Protein: 6.5 g/dL (ref 6.5–8.1)

## 2017-05-25 LAB — CBC WITH DIFFERENTIAL/PLATELET
Basophils Absolute: 0 10*3/uL (ref 0–0.1)
Basophils Relative: 1 %
Eosinophils Absolute: 0.3 10*3/uL (ref 0–0.7)
Eosinophils Relative: 6 %
HCT: 32.6 % — ABNORMAL LOW (ref 35.0–47.0)
Hemoglobin: 11 g/dL — ABNORMAL LOW (ref 12.0–16.0)
Lymphocytes Relative: 17 %
Lymphs Abs: 0.9 10*3/uL — ABNORMAL LOW (ref 1.0–3.6)
MCH: 29.4 pg (ref 26.0–34.0)
MCHC: 33.7 g/dL (ref 32.0–36.0)
MCV: 87.1 fL (ref 80.0–100.0)
Monocytes Absolute: 0.5 10*3/uL (ref 0.2–0.9)
Monocytes Relative: 10 %
Neutro Abs: 3.4 10*3/uL (ref 1.4–6.5)
Neutrophils Relative %: 66 %
Platelets: 206 10*3/uL (ref 150–440)
RBC: 3.75 MIL/uL — ABNORMAL LOW (ref 3.80–5.20)
RDW: 14.8 % — ABNORMAL HIGH (ref 11.5–14.5)
WBC: 5.1 10*3/uL (ref 3.6–11.0)

## 2017-05-25 MED ORDER — ACETAMINOPHEN 325 MG PO TABS
650.0000 mg | ORAL_TABLET | Freq: Once | ORAL | Status: AC
  Administered 2017-05-25: 650 mg via ORAL
  Filled 2017-05-25: qty 2

## 2017-05-25 MED ORDER — HEPARIN SOD (PORK) LOCK FLUSH 100 UNIT/ML IV SOLN
500.0000 [IU] | Freq: Once | INTRAVENOUS | Status: AC | PRN
  Administered 2017-05-25: 500 [IU]
  Filled 2017-05-25: qty 5

## 2017-05-25 MED ORDER — DIPHENHYDRAMINE HCL 25 MG PO CAPS
50.0000 mg | ORAL_CAPSULE | Freq: Once | ORAL | Status: AC
  Administered 2017-05-25: 50 mg via ORAL
  Filled 2017-05-25: qty 2

## 2017-05-25 MED ORDER — SODIUM CHLORIDE 0.9 % IV SOLN
6.0000 mg/kg | Freq: Once | INTRAVENOUS | Status: AC
  Administered 2017-05-25: 336 mg via INTRAVENOUS
  Filled 2017-05-25: qty 16

## 2017-05-25 MED ORDER — SODIUM CHLORIDE 0.9% FLUSH
10.0000 mL | INTRAVENOUS | Status: DC | PRN
  Administered 2017-05-25: 10 mL
  Filled 2017-05-25: qty 10

## 2017-05-25 MED ORDER — SODIUM CHLORIDE 0.9 % IV SOLN
Freq: Once | INTRAVENOUS | Status: AC
  Administered 2017-05-25: 11:00:00 via INTRAVENOUS
  Filled 2017-05-25: qty 1000

## 2017-05-25 NOTE — Addendum Note (Signed)
Addended by: Luella Cook on: 05/25/2017 01:04 PM   Modules accepted: Orders

## 2017-05-25 NOTE — Progress Notes (Signed)
Hematology/Oncology Consult note [facility]  Telephone:([phone] [phone]  Patient Care Team: Rusty Aus, MD as PCP - General (Internal Medicine)   Name of the patient: Doris Barnes  [id number]  Feb 23, 1941   Date of visit: 05/25/17  Diagnosis- Stage IA invasive mammary carcinoma of the left breast ER negative, PR weakly positive and HER-2 positive  Chief complaint/ Reason for visit- on treatment assessment prior tocycle 3 of Q3 weekly herceptin   Heme/Onc history:1. Patient is a 77 year old female who underwent bilateral screening mammogram on 11/28/2016 which showed a possible mass and calcifications in her left breast. This was followed by diagnostic mammogram and an ultrasound of the left breast which showed: 1.4 x 1.2 x 1.3 cm irregular mass in the left breast at the 6:30 position 3 cm from the nipple. On magnification views the overall extent of the mass and calcifications measures 1.9 cm. Left axilla was evaluated with ultrasound and showed no enlarged and morphologically abnormal lymph nodes.   2. She underwent core biopsy of the left breast mass which showed: Invasive mammary carcinoma, grade 3. No DCIS or lymphovascular invasion. ER was negative and PR was 11-50% positive and HER-2 was +3 on IHC  3Menarche at the age of 89 years. G5 P5 L5. She did have twoprior biopsiesof her left breast which did not reveal any malignancy. No family history of breast or ovarian cancer. Her sister and maternal grandfather had colon cancer. She is considerably anxious today at the thought of getting chemotherapy. She does have a history of mitral valve prolapse and states that she has "irregular heartbeat"for which she is on atenolol. She had a Holter monitor in the past but was not found to have any arrhythmia.Baseline MUGA scan showed a normal EF of 64.5%  4. Patient underwent lumpectomy on 01/01/2017 which showed invasive mammary carcinoma,  size of tumor was 14 mm, grade 3 with negative margins. 0 out of one lymph nodes was positive for malignancy. There was associated DCIS. Lymphovascular invasion was negative. ER negative, PR 11-50% positive and HER-2/neu +3  5. Adjuvant taxol herceptin chemo started on 01/16/17.Patient had significant fatigue abdominal bloating and Cramping as well as nosebleeds from Taxol and did not wish to continue with Taxol. She received 5 cycles of weekly Taxol. chemotherapy was held for 3 weeks and did not wish to continue with chemotherapy at that time.Trial of Abraxane was given for cycle #9 of treatment which she tolerated very well.  6. Given that she had PR positive disease- risks benefits of hormone therapy discussed. arimidex started on 05/04/17 along with ca/vit D   Interval history- tolerating arimidex well. She reports some self limited episode of sheap shooting pain in her right leg which subsequently resolved. Denies other side effects  ECOG PS- 0 Pain scale- 0   Review of systems- Review of Systems  Constitutional: Negative for chills, fever, malaise/fatigue and weight loss.  HENT: Negative for congestion, ear discharge and nosebleeds.   Eyes: Negative for blurred vision.  Respiratory: Negative for cough, hemoptysis, sputum production, shortness of breath and wheezing.   Cardiovascular: Negative for chest pain, palpitations, orthopnea and claudication.  Gastrointestinal: Negative for abdominal pain, blood in stool, constipation, diarrhea, heartburn, melena, nausea and vomiting.  Genitourinary: Negative for dysuria, flank pain, frequency, hematuria and urgency.  Musculoskeletal: Negative for back pain, joint pain and myalgias.  Skin: Negative for rash.  Neurological: Negative for dizziness, tingling, focal weakness, seizures, weakness and headaches.  Endo/Heme/Allergies: Does  not bruise/bleed easily.  Psychiatric/Behavioral: Negative for depression and suicidal ideas. The patient does  not have insomnia.       Allergies  Allergen Reactions  . Imipramine Pamoate Shortness Of Breath  . Bisphosphonates Nausea Only  . Epinephrine Other (See Comments)    Difficulty breathing  . Prednisone Palpitations  . Sulfa Antibiotics Rash    Face turns red and stings     Past Medical History:  Diagnosis Date  . Anemia   . Anxiety   . Asthma   . Breast cancer ([place])   . Cancer ([place]) skin  . Chronic vulvitis   . Colon polyp   . Cystocele   . Diverticulitis   . Diverticulitis   . Diverticulosis   . Diverticulosis   . Dyspareunia, female   . Dyspnea   . Dysrhythmia   . Fibrocystic disease of both breasts   . Gastritis   . GERD (gastroesophageal reflux disease)    gastritis also  . Hemorrhoids   . History of hiatal hernia   . Hypercholesteremia   . Hypertension   . IBS (irritable bowel syndrome)   . IC (interstitial cystitis)   . Migraine   . Mitral valve prolapse   . OP (osteoporosis)   . Positional vertigo   . Rectocele   . Squamous cell carcinoma   . Stroke [facility])    TIA  . Vaginal atrophy      Past Surgical History:  Procedure Laterality Date  . APPENDECTOMY    . BREAST BIOPSY Left 1998   core bx- neg  . BREAST BIOPSY Left 2007   neg  . BREAST BIOPSY Left 12/14/2016   left breast US core path pending  . BREAST EXCISIONAL BIOPSY Left   . BREAST LUMPECTOMY Left 01/01/2017    INVASIVE MAMMARY CARCINOMA/Grade 3  . CARDIAC CATHETERIZATION N/A 01/18/2015   Procedure: Left Heart Cath and Coronary Angiography;  Surgeon: Teodoro Spray, MD;  Location: [place] CV LAB;  Service: Cardiovascular;  Laterality: N/A;  . CATARACT EXTRACTION W/PHACO Right 12/15/2015   Procedure: CATARACT EXTRACTION PHACO AND INTRAOCULAR LENS PLACEMENT (IOC);  Surgeon: Estill Cotta, MD;  Location: [facility] ORS;  Service: Ophthalmology;  Laterality: Right;  [place] 01:20 AP% 23.9 CDE 35.49 Fluid pack lot # [id number]  . CATARACT EXTRACTION W/PHACO Left 12/29/2015   Procedure:  CATARACT EXTRACTION PHACO AND INTRAOCULAR LENS PLACEMENT ([place]);  Surgeon: Estill Cotta, MD;  Location: [facility] ORS;  Service: Ophthalmology;  Laterality: Left;  [place]  01:20 AP% 25.1 CDE 32.86 Fluid pack lot # [id number]  . COLON SURGERY    . COLONOSCOPY WITH PROPOFOL N/A 02/21/2016   Procedure: COLONOSCOPY WITH PROPOFOL;  Surgeon: Manya Silvas, MD;  Location: [facility] ENDOSCOPY;  Service: Endoscopy;  Laterality: N/A;  . CORONARY ANGIOPLASTY    . DILATION AND CURETTAGE OF UTERUS    . OOPHORECTOMY Bilateral   . PARTIAL MASTECTOMY WITH NEEDLE LOCALIZATION Left 01/01/2017   Procedure: PARTIAL MASTECTOMY WITH NEEDLE LOCALIZATION;  Surgeon: Herbert Pun, MD;  Location: [facility] ORS;  Service: General;  Laterality: Left;  . PORTACATH PLACEMENT Right 01/01/2017   Procedure: INSERTION PORT-A-CATH;  Surgeon: Herbert Pun, MD;  Location: [facility] ORS;  Service: General;  Laterality: Right;  . SENTINEL NODE BIOPSY Left 01/01/2017   Procedure: SENTINEL NODE BIOPSY;  Surgeon: Herbert Pun, MD;  Location: [facility] ORS;  Service: General;  Laterality: Left;  . TONSILLECTOMY    . VAGINAL HYSTERECTOMY      Social History   Socioeconomic History  .  Marital status: Married    Spouse name: Not on file  . Number of children: Not on file  . Years of education: Not on file  . Highest education level: Not on file  Social Needs  . Financial resource strain: Not on file  . Food insecurity - worry: Not on file  . Food insecurity - inability: Not on file  . Transportation needs - medical: Not on file  . Transportation needs - non-medical: Not on file  Occupational History  . Not on file  Tobacco Use  . Smoking status: Former Smoker    Packs/day: 1.00    Years: 33.00    Pack years: 33.00    Last attempt to quit: 10/17/1987    Years since quitting: 29.6  . Smokeless tobacco: Never Used  Substance and Sexual Activity  . Alcohol use: No  . Drug use: No  . Sexual activity: Yes  Other Topics  Concern  . Not on file  Social History Narrative  . Not on file    Family History  Problem Relation Age of Onset  . Diabetes Mother   . Diabetes Father   . Colon cancer Sister   . Diabetes Sister   . Diabetes Maternal Aunt   . Colon cancer Paternal Grandfather   . Lung cancer Brother   . Breast cancer Neg Hx   . Ovarian cancer Neg Hx      Current Outpatient Medications:  .  acetaminophen (TYLENOL) 325 MG tablet, Take 650 mg by mouth every 6 (six) hours as needed (for headaches.). , Disp: , Rfl:  .  ALPRAZolam (XANAX) 0.25 MG tablet, Take 0.125-0.25 mg by mouth 2 (two) times daily as needed for anxiety or sleep. , Disp: , Rfl:  .  anastrozole (ARIMIDEX) 1 MG tablet, Take 1 tablet (1 mg total) by mouth daily., Disp: 30 tablet, Rfl: 3 .  aspirin EC 325 MG tablet, Take 325 mg by mouth daily with breakfast. , Disp: , Rfl:  .  atenolol (TENORMIN) 50 MG tablet, Take 50 mg by mouth daily with breakfast. , Disp: , Rfl:  .  calcium citrate-vitamin D (CITRACAL+D) 315-200 MG-UNIT tablet, Take 1 tablet by mouth 2 (two) times daily., Disp: , Rfl:  .  diphenhydrAMINE (BENADRYL) 25 MG tablet, Take 12.5 mg by mouth every 6 (six) hours as needed for itching (from rash)., Disp: , Rfl:  .  FLUoxetine (PROZAC) 40 MG capsule, Take 40 mg by mouth at bedtime., Disp: , Rfl:  .  fluticasone (FLONASE) 50 MCG/ACT nasal spray, Place 2 sprays into both nostrils daily as needed for allergies. , Disp: , Rfl:  .  hydrochlorothiazide (HYDRODIURIL) 25 MG tablet, Take 25 mg by mouth daily. , Disp: , Rfl:  .  lansoprazole (PREVACID) 30 MG capsule, Take 30 mg by mouth daily before breakfast. , Disp: , Rfl:  .  loratadine (CLARITIN) 10 MG tablet, Take 10 mg by mouth daily as needed for allergies. , Disp: , Rfl:  .  Magnesium 250 MG TABS, Take 250 mg by mouth daily., Disp: , Rfl:  .  Multiple Vitamins-Minerals (PRESERVISION AREDS 2) CAPS, Take 1 capsule by mouth 2 (two) times daily. , Disp: , Rfl:  .  simethicone (GAS-X)  80 MG chewable tablet, Chew 1 tablet (80 mg total) every 6 (six) hours as needed by mouth for flatulence., Disp: 30 tablet, Rfl: 0 .  simvastatin (ZOCOR) 20 MG tablet, Take 20 mg by mouth daily at 8 pm., Disp: , Rfl:  .  telmisartan (MICARDIS) 80 MG tablet, Take 40 mg by mouth daily with breakfast., Disp: , Rfl:  .  triamcinolone ointment (KENALOG) 0.5 %, Apply 1 application topically 2 (two) times daily., Disp: 30 g, Rfl: 0 .  dicyclomine (BENTYL) 20 MG tablet, Take 1 tablet (20 mg total) by mouth 3 (three) times daily as needed for spasms. (Patient not taking: Reported on 05/25/2017), Disp: 30 tablet, Rfl: 0 .  polyethylene glycol (MIRALAX / GLYCOLAX) packet, Take 17 g daily by mouth., Disp: , Rfl:   Physical exam:  Vitals:   05/25/17 1030 05/25/17 1032  BP: 134/80 134/80  Pulse:  65  Resp:  18  Temp:  (!) 97.5 F (36.4 C)  TempSrc:  Tympanic  Weight: 124 lb 7 oz (56.4 kg) 124 lb 7 oz (56.4 kg)   Physical Exam  Constitutional: She is oriented to person, place, and time and well-developed, well-nourished, and in no distress.  HENT:  Head: Normocephalic and atraumatic.  Eyes: EOM are normal. Pupils are equal, round, and reactive to light.  Neck: Normal range of motion.  Cardiovascular: Normal rate, regular rhythm and normal heart sounds.  Pulmonary/Chest: Effort normal and breath sounds normal.  Abdominal: Soft. Bowel sounds are normal.  Neurological: She is alert and oriented to person, place, and time.  Skin: Skin is warm and dry.     CMP Latest Ref Rng & Units 05/25/2017  Glucose 65 - 99 mg/dL 139(H)  BUN 6 - 20 mg/dL 12  Creatinine 0.44 - 1.00 mg/dL 0.65  Sodium 135 - 145 mmol/L 133(L)  Potassium 3.5 - 5.1 mmol/L 3.7  Chloride 101 - 111 mmol/L 99(L)  CO2 22 - 32 mmol/L 27  Calcium 8.9 - 10.3 mg/dL 9.0  Total Protein 6.5 - 8.1 g/dL 6.5  Total Bilirubin 0.3 - 1.2 mg/dL 0.6  Alkaline Phos 38 - 126 U/L 69  AST 15 - 41 U/L 28  ALT 14 - 54 U/L 19   CBC Latest Ref Rng & Units  05/25/2017  WBC 3.6 - 11.0 K/uL 5.1  Hemoglobin 12.0 - 16.0 g/dL 11.0(L)  Hematocrit 35.0 - 47.0 % 32.6(L)  Platelets 150 - 440 K/uL 206      Assessment and plan- Patient is a 77 y.o. female pathologicalprognostic stage IA pT1c1p N0 cM0invasive mammary carcinoma of the left breast ER negative PR positive and HER-2 +3 on IHC s/p lumpectomy s/p adjuvant chemotherapy here for on treatment assessment prior to cycle 2 of Q3 weekly heceptin adjuvant.  Patient will proceed with next cycle of Herceptin today.  Labs are stable.  I will see her back in 3 weeks for the next cycle of Herceptin.  Recent echo from January 2019 showed normal EF.  Patient will also continue to take Arimidex if she can tolerate it for 5 years  Although she was ER negative she did have PR positive disease and may still derive some benefit from hormone therapy.  She will also take calcium and vitamin D supplements.  We will proceed with a bone density scan at this time  I will see her back in 3 weeks time for the next cycle of Herceptin    Visit Diagnosis 1. Malignant neoplasm of lower-inner quadrant of left breast in female, estrogen receptor positive ([place])   2. Encounter for monoclonal antibody treatment for malignancy      Dr. Randa Evens, MD, MPH [facility] Pager- [phone] 05/25/2017 10:55 AM

## 2017-05-25 NOTE — Progress Notes (Signed)
Pt in for follow up and treatment. Reports occasional spasm in r lower leg at times.  No other concerns at this time.

## 2017-05-28 ENCOUNTER — Emergency Department
Admission: EM | Admit: 2017-05-28 | Discharge: 2017-05-28 | Disposition: A | Discharge status: Home / Self Care | Payer: Medicare HMO | Attending: Emergency Medicine | Admitting: Emergency Medicine

## 2017-05-28 ENCOUNTER — Emergency Department: Payer: Medicare HMO

## 2017-05-28 ENCOUNTER — Encounter: Payer: Self-pay | Admitting: Intensive Care

## 2017-05-28 ENCOUNTER — Ambulatory Visit: Payer: Medicare HMO

## 2017-05-28 DIAGNOSIS — F419 Anxiety disorder, unspecified: Secondary | ICD-10-CM | POA: Insufficient documentation

## 2017-05-28 DIAGNOSIS — Z9861 Coronary angioplasty status: Secondary | ICD-10-CM | POA: Diagnosis not present

## 2017-05-28 DIAGNOSIS — Z853 Personal history of malignant neoplasm of breast: Secondary | ICD-10-CM | POA: Insufficient documentation

## 2017-05-28 DIAGNOSIS — Z8673 Personal history of transient ischemic attack (TIA), and cerebral infarction without residual deficits: Secondary | ICD-10-CM | POA: Insufficient documentation

## 2017-05-28 DIAGNOSIS — I10 Essential (primary) hypertension: Secondary | ICD-10-CM | POA: Insufficient documentation

## 2017-05-28 DIAGNOSIS — R079 Chest pain, unspecified: Secondary | ICD-10-CM | POA: Diagnosis not present

## 2017-05-28 DIAGNOSIS — J45909 Unspecified asthma, uncomplicated: Secondary | ICD-10-CM | POA: Insufficient documentation

## 2017-05-28 DIAGNOSIS — C50919 Malignant neoplasm of unspecified site of unspecified female breast: Secondary | ICD-10-CM | POA: Diagnosis not present

## 2017-05-28 DIAGNOSIS — R0789 Other chest pain: Secondary | ICD-10-CM | POA: Diagnosis not present

## 2017-05-28 DIAGNOSIS — Z87891 Personal history of nicotine dependence: Secondary | ICD-10-CM | POA: Diagnosis not present

## 2017-05-28 LAB — BASIC METABOLIC PANEL
Anion gap: 10 (ref 5–15)
BUN: 11 mg/dL (ref 6–20)
CO2: 27 mmol/L (ref 22–32)
Calcium: 8.9 mg/dL (ref 8.9–10.3)
Chloride: 100 mmol/L — ABNORMAL LOW (ref 101–111)
Creatinine, Ser: 0.74 mg/dL (ref 0.44–1.00)
GFR calc Af Amer: >60 mL/min (ref >60)
GFR calc non Af Amer: >60 mL/min (ref >60)
Glucose, Bld: 163 mg/dL — ABNORMAL HIGH (ref 65–99)
Potassium: 3.6 mmol/L (ref 3.5–5.1)
Sodium: 137 mmol/L (ref 135–145)

## 2017-05-28 LAB — CBC
HCT: 36.5 % (ref 35.0–47.0)
Hemoglobin: 11.9 g/dL — ABNORMAL LOW (ref 12.0–16.0)
MCH: 28.4 pg (ref 26.0–34.0)
MCHC: 32.6 g/dL (ref 32.0–36.0)
MCV: 87.3 fL (ref 80.0–100.0)
Platelets: 215 10*3/uL (ref 150–440)
RBC: 4.18 MIL/uL (ref 3.80–5.20)
RDW: 14.7 % — ABNORMAL HIGH (ref 11.5–14.5)
WBC: 11.3 10*3/uL — ABNORMAL HIGH (ref 3.6–11.0)

## 2017-05-28 LAB — TROPONIN I: Troponin I: <0.03 ng/mL (ref <0.03)

## 2017-05-28 MED ORDER — IOPAMIDOL (ISOVUE-370) INJECTION 76%
75.0000 mL | Freq: Once | INTRAVENOUS | Status: AC | PRN
  Administered 2017-05-28: 75 mL via INTRAVENOUS

## 2017-05-28 MED ORDER — OXYCODONE-ACETAMINOPHEN 5-325 MG PO TABS: 1.0000 | ORAL_TABLET | Freq: Three times a day (TID) | ORAL | 0 refills | Status: DC | PRN

## 2017-05-28 MED ORDER — MORPHINE SULFATE (PF) 4 MG/ML IV SOLN
4.0000 mg | Freq: Once | INTRAVENOUS | Status: AC
  Administered 2017-05-28: 4 mg via INTRAVENOUS
  Filled 2017-05-28: qty 1

## 2017-05-28 MED ORDER — OXYCODONE-ACETAMINOPHEN 5-325 MG PO TABS
2.0000 | ORAL_TABLET | Freq: Once | ORAL | Status: AC
  Administered 2017-05-28: 2 via ORAL
  Filled 2017-05-28: qty 2

## 2017-05-28 MED ORDER — MORPHINE SULFATE (PF) 2 MG/ML IV SOLN
2.0000 mg | Freq: Once | INTRAVENOUS | Status: AC
  Administered 2017-05-28: 2 mg via INTRAVENOUS
  Filled 2017-05-28: qty 1

## 2017-05-28 NOTE — ED Triage Notes (Signed)
Patient reports throbbing central chest pain that radiates to whole upper back that started around 0500. Last chemo treatment X4 weeks ago. L sided breast cancer patient.  Patient also states the chest pain makes her feel SOB

## 2017-05-28 NOTE — ED Provider Notes (Signed)
[facility] Emergency Department Provider Note       Time seen: ----------------------------------------- 8:19 AM on 05/28/2017 -----------------------------------------   I have reviewed the triage vital signs and the nursing notes.  HISTORY   Chief Complaint Chest Pain    HPI Doris Barnes is a 77 y.o. female with a history of breast cancer, diverticulitis, dysrhythmia, GERD, hyperlipidemia, hypertension who presents to the ED for left-sided pleuritic chest pain.  Patient reports left-sided chest pain that radiates into her left shoulder since about 5 AM.  Patient thinks this woke her from sleep.  Her last chemotherapy treatment was 4 weeks ago for breast cancer.  She also states that the chest pain makes her feel short of breath.  She denies fevers, chills, vomiting or diarrhea.  Pain is 8 out of 10 in the left side of her chest  Past Medical History:  Diagnosis Date  . Anemia   . Anxiety   . Asthma   . Breast cancer ([place])   . Cancer ([place]) skin  . Chronic vulvitis   . Colon polyp   . Cystocele   . Diverticulitis   . Diverticulitis   . Diverticulosis   . Diverticulosis   . Dyspareunia, female   . Dyspnea   . Dysrhythmia   . Fibrocystic disease of both breasts   . Gastritis   . GERD (gastroesophageal reflux disease)    gastritis also  . Hemorrhoids   . History of hiatal hernia   . Hypercholesteremia   . Hypertension   . IBS (irritable bowel syndrome)   . IC (interstitial cystitis)   . Migraine   . Mitral valve prolapse   . OP (osteoporosis)   . Positional vertigo   . Rectocele   . Squamous cell carcinoma   . Stroke [facility])    TIA  . Vaginal atrophy     Patient Active Problem List   Diagnosis Date Noted  . Encounter for monoclonal antibody treatment for malignancy 02/20/2017  . Neutropenia ([place]) 01/30/2017  . Goals of care, counseling/discussion 01/09/2017  . Breast cancer ([place]) 12/21/2016  . TIA (transient ischemic attack)  02/22/2016  . Ischemic colitis ([place]) 02/22/2016  . Psoriasiform dermatitis 01/07/2015  . Bloodgood disease 12/31/2014  . HLD (hyperlipidemia) 12/31/2014  . Headache, migraine 12/31/2014  . Billowing mitral valve 12/31/2014  . Osteoporosis, post-menopausal 12/31/2014  . Squamous cell carcinoma 12/31/2014  . Essential hypertension 09/18/2014  . Degeneration of cervical intervertebral disc 09/18/2014  . Ataxia 09/17/2014  . Adaptive colitis 02/02/2014    Past Surgical History:  Procedure Laterality Date  . APPENDECTOMY    . BREAST BIOPSY Left 1998   core bx- neg  . BREAST BIOPSY Left 2007   neg  . BREAST BIOPSY Left 12/14/2016   left breast US core path pending  . BREAST EXCISIONAL BIOPSY Left   . BREAST LUMPECTOMY Left 01/01/2017    INVASIVE MAMMARY CARCINOMA/Grade 3  . CARDIAC CATHETERIZATION N/A 01/18/2015   Procedure: Left Heart Cath and Coronary Angiography;  Surgeon: Teodoro Spray, MD;  Location: [place] CV LAB;  Service: Cardiovascular;  Laterality: N/A;  . CATARACT EXTRACTION W/PHACO Right 12/15/2015   Procedure: CATARACT EXTRACTION PHACO AND INTRAOCULAR LENS PLACEMENT (IOC);  Surgeon: Estill Cotta, MD;  Location: [facility] ORS;  Service: Ophthalmology;  Laterality: Right;  [place] 01:20 AP% 23.9 CDE 35.49 Fluid pack lot # [id number]  . CATARACT EXTRACTION W/PHACO Left 12/29/2015   Procedure: CATARACT EXTRACTION PHACO AND INTRAOCULAR LENS PLACEMENT ([place]);  Surgeon: Remo Lipps  Dingeldein, MD;  Location: [facility] ORS;  Service: Ophthalmology;  Laterality: Left;  [place]  01:20 AP% 25.1 CDE 32.86 Fluid pack lot # [id number]  . COLON SURGERY    . COLONOSCOPY WITH PROPOFOL N/A 02/21/2016   Procedure: COLONOSCOPY WITH PROPOFOL;  Surgeon: Manya Silvas, MD;  Location: [facility] ENDOSCOPY;  Service: Endoscopy;  Laterality: N/A;  . CORONARY ANGIOPLASTY    . DILATION AND CURETTAGE OF UTERUS    . OOPHORECTOMY Bilateral   . PARTIAL MASTECTOMY WITH NEEDLE LOCALIZATION Left 01/01/2017   Procedure:  PARTIAL MASTECTOMY WITH NEEDLE LOCALIZATION;  Surgeon: Herbert Pun, MD;  Location: [facility] ORS;  Service: General;  Laterality: Left;  . PORTACATH PLACEMENT Right 01/01/2017   Procedure: INSERTION PORT-A-CATH;  Surgeon: Herbert Pun, MD;  Location: [facility] ORS;  Service: General;  Laterality: Right;  . SENTINEL NODE BIOPSY Left 01/01/2017   Procedure: SENTINEL NODE BIOPSY;  Surgeon: Herbert Pun, MD;  Location: [facility] ORS;  Service: General;  Laterality: Left;  . TONSILLECTOMY    . VAGINAL HYSTERECTOMY      Allergies Imipramine pamoate; Bisphosphonates; Epinephrine; Prednisone; and Sulfa antibiotics  Social History Social History   Tobacco Use  . Smoking status: Former Smoker    Packs/day: 1.00    Years: 33.00    Pack years: 33.00    Last attempt to quit: 10/17/1987    Years since quitting: 29.6  . Smokeless tobacco: Never Used  Substance Use Topics  . Alcohol use: No  . Drug use: No    Review of Systems Constitutional: Negative for fever. Cardiovascular: Positive for chest pain Respiratory: Positive for pain with breathing Gastrointestinal: Negative for abdominal pain, vomiting and diarrhea. Genitourinary: Negative for dysuria. Musculoskeletal: Negative for back pain. Skin: Negative for rash. Neurological: Negative for headaches, focal weakness or numbness.  All systems negative/normal/unremarkable except as stated in the HPI  ____________________________________________   PHYSICAL EXAM:  VITAL SIGNS: ED Triage Vitals  Enc Vitals Group     BP --      Pulse --      Resp --      Temp --      Temp Source 05/28/17 0807 Oral     SpO2 --      Weight 05/28/17 0805 124 lb (56.2 kg)     Height --      Head Circumference --      [place]ak Flow --      Pain Score 05/28/17 0805 8     Pain Loc --      Pain Edu? --      Excl. in [place]? --    Constitutional: Alert and oriented. Well appearing and in no distress. Eyes: Conjunctivae are normal. Normal  extraocular movements. ENT   Head: Normocephalic and atraumatic.   Nose: No congestion/rhinnorhea.   Mouth/Throat: Mucous membranes are moist.   Neck: No stridor. Cardiovascular: Normal rate, regular rhythm. No murmurs, rubs, or gallops. Respiratory: Normal respiratory effort without tachypnea nor retractions. Breath sounds are clear and equal bilaterally. No wheezes/rales/rhonchi.  Splinting is noted with deep breathing Gastrointestinal: Soft and nontender. Normal bowel sounds Musculoskeletal: Nontender with normal range of motion in extremities. No lower extremity tenderness nor edema. Neurologic:  Normal speech and language. No gross focal neurologic deficits are appreciated.  Skin:  Skin is warm, dry and intact. No rash noted. Psychiatric: Mood and affect are normal. Speech and behavior are normal.  ____________________________________________  EKG: Interpreted by me.  Sinus rhythm rate 84 bpm, normal PR interval, normal QRS size, ST  and T wave abnormalities unchanged from prior  ____________________________________________  ED COURSE:  As part of my medical decision making, I reviewed the following data within the [place] History obtained from family if available, nursing notes, old chart and ekg, as well as notes from prior ED visits. Patient presented for pleuritic chest pain, we will assess with labs and imaging as indicated at this time.   Procedures ____________________________________________   LABS (pertinent positives/negatives)  Labs Reviewed  BASIC METABOLIC PANEL - Abnormal; Notable for the following components:      Result Value   Chloride 100 (*)    Glucose, Bld 163 (*)    All other components within normal limits  CBC - Abnormal; Notable for the following components:   WBC 11.3 (*)    Hemoglobin 11.9 (*)    RDW 14.7 (*)    All other components within normal limits  TROPONIN I    RADIOLOGY Images were viewed by me  CT  angiogram of the chest IMPRESSION: 1. Negative for pulmonary embolus. No acute findings to explain the patient's pain. 2. Aortic atherosclerosis (ICD10-170.0). 3. Liver margin appears slightly irregular, raising suspicion for cirrhosis. ____________________________________________  DIFFERENTIAL DIAGNOSIS   Pleurisy, pleural effusion, pneumonia, PE, pneumothorax, radiation changes  FINAL ASSESSMENT AND PLAN  Chest pain   Plan: The patient had presented for left-sided chest pain with breathing. Patient's labs are unremarkable. Patient's imaging did not reveal any acute process.  Left-sided pleuritic chest pain is likely due to radiation treatments that she has been receiving.  She will receive oral pain medicine and she is cleared for outpatient follow-up.   Earleen Newport, MD   Note: This note was generated in part or whole with voice recognition software. Voice recognition is usually quite accurate but there are transcription errors that can and very often do occur. I apologize for any typographical errors that were not detected and corrected.     Earleen Newport, MD 05/28/17 [phone]

## 2017-05-29 ENCOUNTER — Ambulatory Visit: Admission: RE | Admit: 2017-05-29 | Payer: Medicare HMO | Source: Ambulatory Visit

## 2017-05-29 ENCOUNTER — Ambulatory Visit: Payer: Medicare HMO

## 2017-05-29 DIAGNOSIS — R0789 Other chest pain: Secondary | ICD-10-CM | POA: Diagnosis not present

## 2017-05-29 DIAGNOSIS — I25119 Atherosclerotic heart disease of native coronary artery with unspecified angina pectoris: Secondary | ICD-10-CM | POA: Diagnosis not present

## 2017-05-29 DIAGNOSIS — M810 Age-related osteoporosis without current pathological fracture: Secondary | ICD-10-CM | POA: Diagnosis not present

## 2017-05-29 DIAGNOSIS — R0781 Pleurodynia: Secondary | ICD-10-CM | POA: Diagnosis not present

## 2017-05-29 DIAGNOSIS — E782 Mixed hyperlipidemia: Secondary | ICD-10-CM | POA: Diagnosis not present

## 2017-05-30 ENCOUNTER — Ambulatory Visit: Payer: Medicare HMO

## 2017-05-31 ENCOUNTER — Ambulatory Visit
Admission: RE | Admit: 2017-05-31 | Discharge: 2017-05-31 | Disposition: A | Discharge status: Home / Self Care | Payer: Medicare HMO | Source: Ambulatory Visit | Attending: Radiation Oncology | Admitting: Radiation Oncology

## 2017-05-31 ENCOUNTER — Ambulatory Visit: Payer: Medicare HMO

## 2017-05-31 DIAGNOSIS — Z170 Estrogen receptor positive status [ER+]: Secondary | ICD-10-CM | POA: Diagnosis not present

## 2017-05-31 DIAGNOSIS — Z510 Encounter for antineoplastic radiation therapy: Secondary | ICD-10-CM | POA: Diagnosis not present

## 2017-05-31 DIAGNOSIS — C50312 Malignant neoplasm of lower-inner quadrant of left female breast: Secondary | ICD-10-CM | POA: Diagnosis not present

## 2017-05-31 DIAGNOSIS — C50512 Malignant neoplasm of lower-outer quadrant of left female breast: Secondary | ICD-10-CM | POA: Diagnosis not present

## 2017-05-31 DIAGNOSIS — Z171 Estrogen receptor negative status [ER-]: Secondary | ICD-10-CM | POA: Diagnosis not present

## 2017-06-01 ENCOUNTER — Ambulatory Visit
Admission: RE | Admit: 2017-06-01 | Discharge: 2017-06-01 | Disposition: A | Discharge status: Home / Self Care | Payer: Medicare HMO | Source: Ambulatory Visit | Attending: Radiation Oncology | Admitting: Radiation Oncology

## 2017-06-01 ENCOUNTER — Ambulatory Visit: Payer: Medicare HMO

## 2017-06-01 DIAGNOSIS — C50512 Malignant neoplasm of lower-outer quadrant of left female breast: Secondary | ICD-10-CM | POA: Diagnosis not present

## 2017-06-01 DIAGNOSIS — Z170 Estrogen receptor positive status [ER+]: Secondary | ICD-10-CM | POA: Diagnosis not present

## 2017-06-01 DIAGNOSIS — Z510 Encounter for antineoplastic radiation therapy: Secondary | ICD-10-CM | POA: Diagnosis not present

## 2017-06-04 ENCOUNTER — Ambulatory Visit
Admission: RE | Admit: 2017-06-04 | Discharge: 2017-06-04 | Disposition: A | Discharge status: Home / Self Care | Payer: Medicare HMO | Source: Ambulatory Visit | Attending: Radiation Oncology | Admitting: Radiation Oncology

## 2017-06-04 DIAGNOSIS — Z510 Encounter for antineoplastic radiation therapy: Secondary | ICD-10-CM | POA: Diagnosis not present

## 2017-06-04 DIAGNOSIS — Z170 Estrogen receptor positive status [ER+]: Secondary | ICD-10-CM | POA: Diagnosis not present

## 2017-06-04 DIAGNOSIS — C50512 Malignant neoplasm of lower-outer quadrant of left female breast: Secondary | ICD-10-CM | POA: Diagnosis not present

## 2017-06-05 ENCOUNTER — Ambulatory Visit: Payer: Medicare HMO

## 2017-06-05 ENCOUNTER — Ambulatory Visit
Admission: RE | Admit: 2017-06-05 | Discharge: 2017-06-05 | Disposition: A | Discharge status: Home / Self Care | Payer: Medicare HMO | Source: Ambulatory Visit | Attending: Radiation Oncology | Admitting: Radiation Oncology

## 2017-06-05 DIAGNOSIS — Z510 Encounter for antineoplastic radiation therapy: Secondary | ICD-10-CM | POA: Diagnosis not present

## 2017-06-05 DIAGNOSIS — Z170 Estrogen receptor positive status [ER+]: Secondary | ICD-10-CM | POA: Diagnosis not present

## 2017-06-05 DIAGNOSIS — C50512 Malignant neoplasm of lower-outer quadrant of left female breast: Secondary | ICD-10-CM | POA: Diagnosis not present

## 2017-06-06 ENCOUNTER — Ambulatory Visit
Admission: RE | Admit: 2017-06-06 | Discharge: 2017-06-06 | Disposition: A | Discharge status: Home / Self Care | Payer: Medicare HMO | Source: Ambulatory Visit | Attending: Radiation Oncology | Admitting: Radiation Oncology

## 2017-06-06 DIAGNOSIS — C50512 Malignant neoplasm of lower-outer quadrant of left female breast: Secondary | ICD-10-CM | POA: Diagnosis not present

## 2017-06-06 DIAGNOSIS — C50312 Malignant neoplasm of lower-inner quadrant of left female breast: Secondary | ICD-10-CM | POA: Diagnosis not present

## 2017-06-06 DIAGNOSIS — Z171 Estrogen receptor negative status [ER-]: Secondary | ICD-10-CM | POA: Diagnosis not present

## 2017-06-06 DIAGNOSIS — Z510 Encounter for antineoplastic radiation therapy: Secondary | ICD-10-CM | POA: Diagnosis not present

## 2017-06-06 DIAGNOSIS — Z170 Estrogen receptor positive status [ER+]: Secondary | ICD-10-CM | POA: Diagnosis not present

## 2017-06-07 ENCOUNTER — Ambulatory Visit: Payer: Medicare HMO

## 2017-06-07 ENCOUNTER — Ambulatory Visit
Admission: RE | Admit: 2017-06-07 | Discharge: 2017-06-07 | Disposition: A | Discharge status: Home / Self Care | Payer: Medicare HMO | Source: Ambulatory Visit | Attending: Radiation Oncology | Admitting: Radiation Oncology

## 2017-06-07 DIAGNOSIS — Z510 Encounter for antineoplastic radiation therapy: Secondary | ICD-10-CM | POA: Diagnosis not present

## 2017-06-07 DIAGNOSIS — C50512 Malignant neoplasm of lower-outer quadrant of left female breast: Secondary | ICD-10-CM | POA: Diagnosis not present

## 2017-06-07 DIAGNOSIS — Z170 Estrogen receptor positive status [ER+]: Secondary | ICD-10-CM | POA: Diagnosis not present

## 2017-06-08 ENCOUNTER — Ambulatory Visit
Admission: RE | Admit: 2017-06-08 | Discharge: 2017-06-08 | Disposition: A | Discharge status: Home / Self Care | Payer: Medicare HMO | Source: Ambulatory Visit | Attending: Radiation Oncology | Admitting: Radiation Oncology

## 2017-06-08 DIAGNOSIS — C50512 Malignant neoplasm of lower-outer quadrant of left female breast: Secondary | ICD-10-CM | POA: Diagnosis not present

## 2017-06-08 DIAGNOSIS — Z510 Encounter for antineoplastic radiation therapy: Secondary | ICD-10-CM | POA: Diagnosis not present

## 2017-06-08 DIAGNOSIS — Z170 Estrogen receptor positive status [ER+]: Secondary | ICD-10-CM | POA: Diagnosis not present

## 2017-06-11 ENCOUNTER — Ambulatory Visit
Admission: RE | Admit: 2017-06-11 | Discharge: 2017-06-11 | Disposition: A | Discharge status: Home / Self Care | Payer: Medicare HMO | Source: Ambulatory Visit | Attending: Radiation Oncology | Admitting: Radiation Oncology

## 2017-06-11 DIAGNOSIS — Z170 Estrogen receptor positive status [ER+]: Secondary | ICD-10-CM | POA: Diagnosis not present

## 2017-06-11 DIAGNOSIS — Z510 Encounter for antineoplastic radiation therapy: Secondary | ICD-10-CM | POA: Diagnosis not present

## 2017-06-11 DIAGNOSIS — C50512 Malignant neoplasm of lower-outer quadrant of left female breast: Secondary | ICD-10-CM | POA: Diagnosis not present

## 2017-06-12 ENCOUNTER — Ambulatory Visit
Admission: RE | Admit: 2017-06-12 | Discharge: 2017-06-12 | Disposition: A | Discharge status: Home / Self Care | Payer: Medicare HMO | Source: Ambulatory Visit | Attending: Radiation Oncology | Admitting: Radiation Oncology

## 2017-06-12 DIAGNOSIS — Z170 Estrogen receptor positive status [ER+]: Secondary | ICD-10-CM | POA: Diagnosis not present

## 2017-06-12 DIAGNOSIS — C50512 Malignant neoplasm of lower-outer quadrant of left female breast: Secondary | ICD-10-CM | POA: Diagnosis not present

## 2017-06-12 DIAGNOSIS — Z510 Encounter for antineoplastic radiation therapy: Secondary | ICD-10-CM | POA: Diagnosis not present

## 2017-06-15 ENCOUNTER — Inpatient Hospital Stay: Payer: Medicare HMO

## 2017-06-15 ENCOUNTER — Ambulatory Visit: Payer: Medicare HMO | Admitting: Oncology

## 2017-06-15 ENCOUNTER — Other Ambulatory Visit: Payer: Self-pay | Admitting: Oncology

## 2017-06-15 VITALS — BP 127/72 | HR 65 | Temp 97.6°F | Resp 18 | Wt 118.0 lb

## 2017-06-15 DIAGNOSIS — Z5112 Encounter for antineoplastic immunotherapy: Secondary | ICD-10-CM | POA: Diagnosis not present

## 2017-06-15 DIAGNOSIS — I341 Nonrheumatic mitral (valve) prolapse: Secondary | ICD-10-CM | POA: Diagnosis not present

## 2017-06-15 DIAGNOSIS — D649 Anemia, unspecified: Secondary | ICD-10-CM | POA: Diagnosis not present

## 2017-06-15 DIAGNOSIS — F419 Anxiety disorder, unspecified: Secondary | ICD-10-CM | POA: Diagnosis not present

## 2017-06-15 DIAGNOSIS — Z79811 Long term (current) use of aromatase inhibitors: Secondary | ICD-10-CM | POA: Diagnosis not present

## 2017-06-15 DIAGNOSIS — Z170 Estrogen receptor positive status [ER+]: Secondary | ICD-10-CM | POA: Diagnosis not present

## 2017-06-15 DIAGNOSIS — C50312 Malignant neoplasm of lower-inner quadrant of left female breast: Secondary | ICD-10-CM | POA: Diagnosis not present

## 2017-06-15 DIAGNOSIS — J45909 Unspecified asthma, uncomplicated: Secondary | ICD-10-CM | POA: Diagnosis not present

## 2017-06-15 DIAGNOSIS — N941 Unspecified dyspareunia: Secondary | ICD-10-CM | POA: Diagnosis not present

## 2017-06-15 MED ORDER — DIPHENHYDRAMINE HCL 25 MG PO CAPS
50.0000 mg | ORAL_CAPSULE | Freq: Once | ORAL | Status: AC
  Administered 2017-06-15: 25 mg via ORAL
  Filled 2017-06-15: qty 2

## 2017-06-15 MED ORDER — ACETAMINOPHEN 325 MG PO TABS
650.0000 mg | ORAL_TABLET | Freq: Once | ORAL | Status: AC
  Administered 2017-06-15: 650 mg via ORAL
  Filled 2017-06-15: qty 2

## 2017-06-15 MED ORDER — HEPARIN SOD (PORK) LOCK FLUSH 100 UNIT/ML IV SOLN
500.0000 [IU] | Freq: Once | INTRAVENOUS | Status: AC | PRN
  Administered 2017-06-15: 500 [IU]
  Filled 2017-06-15: qty 5

## 2017-06-15 MED ORDER — SODIUM CHLORIDE 0.9 % IV SOLN
Freq: Once | INTRAVENOUS | Status: AC
  Administered 2017-06-15: 12:00:00 via INTRAVENOUS
  Filled 2017-06-15: qty 1000

## 2017-06-15 MED ORDER — TRASTUZUMAB CHEMO 150 MG IV SOLR
6.0000 mg/kg | Freq: Once | INTRAVENOUS | Status: AC
  Administered 2017-06-15: 336 mg via INTRAVENOUS
  Filled 2017-06-15: qty 16

## 2017-07-06 ENCOUNTER — Inpatient Hospital Stay: Payer: Medicare HMO | Attending: Oncology | Admitting: Oncology

## 2017-07-06 ENCOUNTER — Encounter: Payer: Self-pay | Admitting: Oncology

## 2017-07-06 ENCOUNTER — Inpatient Hospital Stay: Payer: Medicare HMO

## 2017-07-06 ENCOUNTER — Encounter: Payer: Self-pay | Admitting: *Deleted

## 2017-07-06 ENCOUNTER — Telehealth: Payer: Self-pay | Admitting: Oncology

## 2017-07-06 VITALS — BP 138/80 | HR 65 | Temp 97.8°F | Resp 18 | Ht 62.0 in | Wt 122.3 lb

## 2017-07-06 DIAGNOSIS — E7800 Pure hypercholesterolemia, unspecified: Secondary | ICD-10-CM | POA: Diagnosis not present

## 2017-07-06 DIAGNOSIS — F419 Anxiety disorder, unspecified: Secondary | ICD-10-CM | POA: Diagnosis not present

## 2017-07-06 DIAGNOSIS — Z9221 Personal history of antineoplastic chemotherapy: Secondary | ICD-10-CM | POA: Insufficient documentation

## 2017-07-06 DIAGNOSIS — M810 Age-related osteoporosis without current pathological fracture: Secondary | ICD-10-CM | POA: Diagnosis not present

## 2017-07-06 DIAGNOSIS — Z800 Family history of malignant neoplasm of digestive organs: Secondary | ICD-10-CM | POA: Insufficient documentation

## 2017-07-06 DIAGNOSIS — Z8673 Personal history of transient ischemic attack (TIA), and cerebral infarction without residual deficits: Secondary | ICD-10-CM | POA: Diagnosis not present

## 2017-07-06 DIAGNOSIS — Z170 Estrogen receptor positive status [ER+]: Principal | ICD-10-CM

## 2017-07-06 DIAGNOSIS — J45909 Unspecified asthma, uncomplicated: Secondary | ICD-10-CM | POA: Diagnosis not present

## 2017-07-06 DIAGNOSIS — C50312 Malignant neoplasm of lower-inner quadrant of left female breast: Secondary | ICD-10-CM

## 2017-07-06 DIAGNOSIS — I341 Nonrheumatic mitral (valve) prolapse: Secondary | ICD-10-CM | POA: Diagnosis not present

## 2017-07-06 DIAGNOSIS — Z79899 Other long term (current) drug therapy: Secondary | ICD-10-CM | POA: Diagnosis not present

## 2017-07-06 DIAGNOSIS — I10 Essential (primary) hypertension: Secondary | ICD-10-CM | POA: Diagnosis not present

## 2017-07-06 DIAGNOSIS — Z87891 Personal history of nicotine dependence: Secondary | ICD-10-CM | POA: Insufficient documentation

## 2017-07-06 DIAGNOSIS — Z5112 Encounter for antineoplastic immunotherapy: Secondary | ICD-10-CM | POA: Diagnosis not present

## 2017-07-06 DIAGNOSIS — K219 Gastro-esophageal reflux disease without esophagitis: Secondary | ICD-10-CM | POA: Insufficient documentation

## 2017-07-06 DIAGNOSIS — Z79811 Long term (current) use of aromatase inhibitors: Secondary | ICD-10-CM | POA: Diagnosis not present

## 2017-07-06 DIAGNOSIS — D649 Anemia, unspecified: Secondary | ICD-10-CM | POA: Insufficient documentation

## 2017-07-06 DIAGNOSIS — Z86010 Personal history of colonic polyps: Secondary | ICD-10-CM | POA: Diagnosis not present

## 2017-07-06 DIAGNOSIS — K589 Irritable bowel syndrome without diarrhea: Secondary | ICD-10-CM | POA: Diagnosis not present

## 2017-07-06 DIAGNOSIS — Z8719 Personal history of other diseases of the digestive system: Secondary | ICD-10-CM | POA: Insufficient documentation

## 2017-07-06 MED ORDER — SODIUM CHLORIDE 0.9 % IV SOLN
Freq: Once | INTRAVENOUS | Status: AC
  Administered 2017-07-06: 09:00:00 via INTRAVENOUS
  Filled 2017-07-06: qty 1000

## 2017-07-06 MED ORDER — DIPHENHYDRAMINE HCL 25 MG PO CAPS
50.0000 mg | ORAL_CAPSULE | Freq: Once | ORAL | Status: AC
  Administered 2017-07-06: 25 mg via ORAL
  Filled 2017-07-06: qty 2

## 2017-07-06 MED ORDER — SODIUM CHLORIDE 0.9% FLUSH
10.0000 mL | INTRAVENOUS | Status: DC | PRN
  Administered 2017-07-06: 10 mL via INTRAVENOUS
  Filled 2017-07-06: qty 10

## 2017-07-06 MED ORDER — ACETAMINOPHEN 325 MG PO TABS
650.0000 mg | ORAL_TABLET | Freq: Once | ORAL | Status: AC
  Administered 2017-07-06: 650 mg via ORAL
  Filled 2017-07-06: qty 2

## 2017-07-06 MED ORDER — HEPARIN SOD (PORK) LOCK FLUSH 100 UNIT/ML IV SOLN
500.0000 [IU] | Freq: Once | INTRAVENOUS | Status: AC
  Administered 2017-07-06: 500 [IU] via INTRAVENOUS
  Filled 2017-07-06: qty 5

## 2017-07-06 MED ORDER — TRASTUZUMAB CHEMO 150 MG IV SOLR
6.0000 mg/kg | Freq: Once | INTRAVENOUS | Status: AC
  Administered 2017-07-06: 336 mg via INTRAVENOUS
  Filled 2017-07-06: qty 16

## 2017-07-06 NOTE — Telephone Encounter (Signed)
Patient called to rschd ECHO appt date. ECHO was rschd and conf with patient for 07/23/17 @ 10 a.m. Updated appt ltrs mailed to patient also.

## 2017-07-06 NOTE — Progress Notes (Signed)
No new changes noted today 

## 2017-07-06 NOTE — Progress Notes (Signed)
Hematology/Oncology Consult note [facility]  [phone] Fax:([phone]  Patient Care Team: Rusty Aus, MD as PCP - General (Internal Medicine)   Name of the patient: Doris Barnes  [id number]  12-Apr-1940   Date of visit: 07/06/17  Diagnosis- Stage IA invasive mammary carcinoma of the left breast ER negative, PR weakly positive and HER-2 positive  Chief complaint/ Reason for visit- on treatment assessment prior tocycle5of Q3 weekly herceptin   Heme/Onc history:1. Patient is a 77 year old female who underwent bilateral screening mammogram on 11/28/2016 which showed a possible mass and calcifications in her left breast. This was followed by diagnostic mammogram and an ultrasound of the left breast which showed: 1.4 x 1.2 x 1.3 cm irregular mass in the left breast at the 6:30 position 3 cm from the nipple. On magnification views the overall extent of the mass and calcifications measures 1.9 cm. Left axilla was evaluated with ultrasound and showed no enlarged and morphologically abnormal lymph nodes.   2. She underwent core biopsy of the left breast mass which showed: Invasive mammary carcinoma, grade 3. No DCIS or lymphovascular invasion. ER was negative and PR was 11-50% positive and HER-2 was +3 on IHC  3Menarche at the age of 70 years. G5 P5 L5. She did have twoprior biopsiesof her left breast which did not reveal any malignancy. No family history of breast or ovarian cancer. Her sister and maternal grandfather had colon cancer. She is considerably anxious today at the thought of getting chemotherapy. She does have a history of mitral valve prolapse and states that she has "irregular heartbeat"for which she is on atenolol. She had a Holter monitor in the past but was not found to have any arrhythmia.Baseline MUGA scan showed a normal EF of 64.5%  4. Patient underwent lumpectomy on 01/01/2017 which showed invasive mammary carcinoma,  size of tumor was 14 mm, grade 3 with negative margins. 0 out of one lymph nodes was positive for malignancy. There was associated DCIS. Lymphovascular invasion was negative. ER negative, PR 11-50% positive and HER-2/neu +3  5. Adjuvant taxol herceptin chemo started on 01/16/17.Patient had significant fatigue abdominal bloating and Cramping as well as nosebleeds from Taxol and did not wish to continue with Taxol. She received 5 cycles of weekly Taxol. chemotherapy was held for 3 weeks and did not wish to continue with chemotherapy at that time.Trial of Abraxane was given for cycle #9 of treatment which she tolerated very well.  6. Given that she had PR positive disease- risks benefits of hormone therapy discussed. arimidex started on 05/04/17 along with ca/vit D   Interval history- she is tolerating arimidex well. Reports occasional leg cramps. Denies other complaints denies any problems or side effects with herceptin  ECOG PS- 0 Pain scale- 0   Review of systems- Review of Systems  Constitutional: Negative for chills, fever, malaise/fatigue and weight loss.  HENT: Negative for congestion, ear discharge and nosebleeds.   Eyes: Negative for blurred vision.  Respiratory: Negative for cough, hemoptysis, sputum production, shortness of breath and wheezing.   Cardiovascular: Negative for chest pain, palpitations, orthopnea and claudication.  Gastrointestinal: Negative for abdominal pain, blood in stool, constipation, diarrhea, heartburn, melena, nausea and vomiting.  Genitourinary: Negative for dysuria, flank pain, frequency, hematuria and urgency.  Musculoskeletal: Negative for back pain, joint pain and myalgias.       Leg cramps  Skin: Negative for rash.  Neurological: Negative for dizziness, tingling, focal weakness, seizures, weakness and headaches.  Endo/Heme/Allergies: Does not bruise/bleed easily.  Psychiatric/Behavioral: Negative for depression and suicidal ideas. The patient does  not have insomnia.       Allergies  Allergen Reactions  . Imipramine Pamoate Shortness Of Breath  . Bisphosphonates Nausea Only  . Epinephrine Other (See Comments)    Difficulty breathing  . Prednisone Palpitations  . Sulfa Antibiotics Rash    Face turns red and stings     Past Medical History:  Diagnosis Date  . Anemia   . Anxiety   . Asthma   . Breast cancer ([place])   . Cancer ([place]) skin  . Chronic vulvitis   . Colon polyp   . Cystocele   . Diverticulitis   . Diverticulitis   . Diverticulosis   . Diverticulosis   . Dyspareunia, female   . Dyspnea   . Dysrhythmia   . Fibrocystic disease of both breasts   . Gastritis   . GERD (gastroesophageal reflux disease)    gastritis also  . Hemorrhoids   . History of hiatal hernia   . Hypercholesteremia   . Hypertension   . IBS (irritable bowel syndrome)   . IC (interstitial cystitis)   . Migraine   . Mitral valve prolapse   . OP (osteoporosis)   . Positional vertigo   . Rectocele   . Squamous cell carcinoma   . Stroke [facility])    TIA  . Vaginal atrophy      Past Surgical History:  Procedure Laterality Date  . APPENDECTOMY    . BREAST BIOPSY Left 1998   core bx- neg  . BREAST BIOPSY Left 2007   neg  . BREAST BIOPSY Left 12/14/2016   left breast US core path pending  . BREAST EXCISIONAL BIOPSY Left   . BREAST LUMPECTOMY Left 01/01/2017    INVASIVE MAMMARY CARCINOMA/Grade 3  . CARDIAC CATHETERIZATION N/A 01/18/2015   Procedure: Left Heart Cath and Coronary Angiography;  Surgeon: Teodoro Spray, MD;  Location: [place] [facility];  Service: Cardiovascular;  Laterality: N/A;  . CATARACT EXTRACTION W/PHACO Right 12/15/2015   Procedure: CATARACT EXTRACTION PHACO AND INTRAOCULAR LENS PLACEMENT (IOC);  Surgeon: Estill Cotta, MD;  Location: [facility] ORS;  Service: Ophthalmology;  Laterality: Right;  [place] 01:20 AP% 23.9 CDE 35.49 Fluid pack lot # [id number]  . CATARACT EXTRACTION W/PHACO Left 12/29/2015   Procedure:  CATARACT EXTRACTION PHACO AND INTRAOCULAR LENS PLACEMENT (Gas);  Surgeon: Estill Cotta, MD;  Location: [facility] OR[facility];  Service: Ophthalmology;  Laterality: Left;  [place]  01:20 AP% 25.1 CDE 32.86 Fluid pack lot # [id number]  . COLON SURGERY    . COLONOSCOPY WITH PROPOFOL N/A 02/21/2016   Procedure: COLONOSCOPY WITH PROPOFOL;  Surgeon: Manya Silvas, MD;  Location: [facility] ENDOSCOPY;  Service: Endoscopy;  Laterality: N/A;  . CORONARY ANGIOPLASTY    . DILATION AND CURETTAGE OF UTERUS    . OOPHORECTOMY Bilateral   . PARTIAL MASTECTOMY WITH NEEDLE LOCALIZATION Left 01/01/2017   Procedure: PARTIAL MASTECTOMY WITH NEEDLE LOCALIZATION;  Surgeon: Herbert Pun, MD;  Location: [facility] OR[facility];  Service: General;  Laterality: Left;  . PORTACATH PLACEMENT Right 01/01/2017   Procedure: INSERTION PORT-A-CATH;  Surgeon: Herbert Pun, MD;  Location: [facility] OR[facility];  Service: General;  Laterality: Right;  . SENTINEL NODE BIOPSY Left 01/01/2017   Procedure: SENTINEL NODE BIOPSY;  Surgeon: Herbert Pun, MD;  Location: [facility] ORS;  Service: General;  Laterality: Left;  . TONSILLECTOMY    . VAGINAL HYSTERECTOMY      Social History   Socioeconomic History  .  Marital status: Married    Spouse name: Not on file  . Number of children: Not on file  . Years of education: Not on file  . Highest education level: Not on file  Occupational History  . Not on file  Social Needs  . Financial resource strain: Not on file  . Food insecurity:    Worry: Not on file    Inability: Not on file  . Transportation needs:    Medical: Not on file    Non-medical: Not on file  Tobacco Use  . Smoking status: Former Smoker    Packs/day: 1.00    Years: 33.00    Pack years: 33.00    Last attempt to quit: 10/17/1987    Years since quitting: 29.7  . Smokeless tobacco: Never Used  Substance and Sexual Activity  . Alcohol use: No  . Drug use: No  . Sexual activity: Yes  Lifestyle  . Physical activity:    Days per  week: Not on file    Minutes per session: Not on file  . Stress: Not on file  Relationships  . Social connections:    Talks on phone: Not on file    Gets together: Not on file    Attends religious service: Not on file    Active member of club or organization: Not on file    Attends meetings of clubs or organizations: Not on file    Relationship status: Not on file  . Intimate partner violence:    Fear of current or ex partner: Not on file    Emotionally abused: Not on file    Physically abused: Not on file    Forced sexual activity: Not on file  Other Topics Concern  . Not on file  Social History Narrative  . Not on file    Family History  Problem Relation Age of Onset  . Diabetes Mother   . Diabetes Father   . Colon cancer Sister   . Diabetes Sister   . Diabetes Maternal Aunt   . Colon cancer Paternal Grandfather   . Lung cancer Brother   . Breast cancer Neg Hx   . Ovarian cancer Neg Hx      Current Outpatient Medications:  .  acetaminophen (TYLENOL) 325 MG tablet, Take 650 mg by mouth every 6 (six) hours as needed (for headaches.). , Disp: , Rfl:  .  ALPRAZolam (XANAX) 0.25 MG tablet, Take 0.25 mg by mouth at bedtime as needed for anxiety or sleep. , Disp: , Rfl:  .  anastrozole (ARIMIDEX) 1 MG tablet, Take 1 tablet (1 mg total) by mouth daily., Disp: 30 tablet, Rfl: 3 .  aspirin EC 325 MG tablet, Take 325 mg by mouth daily with breakfast. , Disp: , Rfl:  .  atenolol (TENORMIN) 50 MG tablet, Take 50 mg by mouth daily with breakfast. , Disp: , Rfl:  .  calcium citrate-vitamin D (CITRACAL+D) 315-200 MG-UNIT tablet, Take 1 tablet by mouth 2 (two) times daily., Disp: , Rfl:  .  dicyclomine (BENTYL) 20 MG tablet, Take 1 tablet (20 mg total) by mouth 3 (three) times daily as needed for spasms. (Patient not taking: Reported on 05/25/2017), Disp: 30 tablet, Rfl: 0 .  FLUoxetine (PROZAC) 40 MG capsule, Take 40 mg by mouth at bedtime., Disp: , Rfl:  .  fluticasone (FLONASE) 50  MCG/ACT nasal spray, Place 2 sprays into both nostrils daily as needed for allergies. , Disp: , Rfl:  .  hydrochlorothiazide (HYDRODIURIL) 25 MG  tablet, Take 25 mg by mouth daily. , Disp: , Rfl:  .  lansoprazole (PREVACID) 30 MG capsule, Take 30 mg by mouth daily before breakfast. , Disp: , Rfl:  .  loratadine (CLARITIN) 10 MG tablet, Take 10 mg by mouth daily as needed for allergies. , Disp: , Rfl:  .  Magnesium 250 MG TABS, Take 250 mg by mouth daily., Disp: , Rfl:  .  Multiple Vitamins-Minerals (PRESERVISION AREDS 2) CAPS, Take 1 capsule by mouth 2 (two) times daily. , Disp: , Rfl:  .  oxyCODONE-acetaminophen (PERCOCET) 5-325 MG tablet, Take 1-2 tablets by mouth every 8 (eight) hours as needed., Disp: 20 tablet, Rfl: 0 .  polyethylene glycol (MIRALAX / GLYCOLAX) packet, Take 17 g daily by mouth., Disp: , Rfl:  .  simethicone (GAS-X) 80 MG chewable tablet, Chew 1 tablet (80 mg total) every 6 (six) hours as needed by mouth for flatulence., Disp: 30 tablet, Rfl: 0 .  simvastatin (ZOCOR) 20 MG tablet, Take 20 mg by mouth daily at 8 pm., Disp: , Rfl:  .  telmisartan (MICARDIS) 80 MG tablet, Take 40 mg by mouth daily with breakfast., Disp: , Rfl:  .  triamcinolone ointment (KENALOG) 0.5 %, Apply 1 application topically 2 (two) times daily. (Patient not taking: Reported on 05/28/2017), Disp: 30 g, Rfl: 0  Physical exam:  Vitals:   07/06/17 0836  BP: 138/80  Pulse: 65  Resp: 18  Temp: 97.8 F (36.6 C)  TempSrc: Tympanic  Weight: 122 lb 4.8 oz (55.5 kg)  Height: '5\' 2"'  (1.575 m)   Physical Exam  Constitutional: She is oriented to person, place, and time.  HENT:  Head: Normocephalic and atraumatic.  Eyes: Pupils are equal, round, and reactive to light. EOM are normal.  Neck: Normal range of motion.  Cardiovascular: Normal rate, regular rhythm and normal heart sounds.  Pulmonary/Chest: Effort normal and breath sounds normal.  Abdominal: Soft. Bowel sounds are normal.  Neurological: She is  alert and oriented to person, place, and time.  Skin: Skin is warm and dry.     CMP Latest Ref Rng & Units 05/28/2017  Glucose 65 - 99 mg/dL 163(H)  BUN 6 - 20 mg/dL 11  Creatinine 0.44 - 1.00 mg/dL 0.74  Sodium 135 - 145 mmol/L 137  Potassium 3.5 - 5.1 mmol/L 3.6  Chloride 101 - 111 mmol/L 100(L)  CO2 22 - 32 mmol/L 27  Calcium 8.9 - 10.3 mg/dL 8.9  Total Protein 6.5 - 8.1 g/dL -  Total Bilirubin 0.3 - 1.2 mg/dL -  Alkaline Phos 38 - 126 U/L -  AST 15 - 41 U/L -  ALT 14 - 54 U/L -   CBC Latest Ref Rng & Units 05/28/2017  WBC 3.6 - 11.0 K/uL 11.3(H)  Hemoglobin 12.0 - 16.0 g/dL 11.9(L)  Hematocrit 35.0 - 47.0 % 36.5  Platelets 150 - 440 K/uL 215    No images are attached to the encounter.  No results found.   Assessment and plan- Patient is a 77 y.o. female female pathologicalprognostic stage IA pT1c1p N0 [phone] mammary carcinoma of the left breast ER negative PR positive and HER-2 +3 on IHC s/p lumpectomy s/p adjuvant chemotherapy here for on treatment assessment prior to cycle5of Q3 weekly heceptin adjuvant.  Counts ok to proceed with adjuvant herceptin today. She will get next cycle in 3 weeks. She will be seen by covering provider in 6 weeks with cbc, cmp prior to next cycle of herceptin. She will need ECHO in  the next couple of weeks for monitoring on herceptin  She will continue arimidex for 5 years along with calcium and Vit D    Visit Diagnosis 1. Malignant neoplasm of lower-inner quadrant of left breast in female, estrogen receptor positive ([place])   2. Encounter for monoclonal antibody treatment for malignancy      Dr. Randa Evens, MD, MPH [facility] [phone] 07/06/2017 1:14 PM

## 2017-07-20 ENCOUNTER — Ambulatory Visit: Payer: Medicare HMO

## 2017-07-20 ENCOUNTER — Ambulatory Visit: Payer: Medicare HMO | Admitting: Radiation Oncology

## 2017-07-23 ENCOUNTER — Ambulatory Visit
Admission: RE | Admit: 2017-07-23 | Discharge: 2017-07-23 | Disposition: A | Discharge status: Home / Self Care | Payer: Medicare HMO | Source: Ambulatory Visit | Attending: Oncology | Admitting: Oncology

## 2017-07-23 DIAGNOSIS — I341 Nonrheumatic mitral (valve) prolapse: Secondary | ICD-10-CM | POA: Insufficient documentation

## 2017-07-23 DIAGNOSIS — Z8673 Personal history of transient ischemic attack (TIA), and cerebral infarction without residual deficits: Secondary | ICD-10-CM | POA: Diagnosis not present

## 2017-07-23 DIAGNOSIS — C50312 Malignant neoplasm of lower-inner quadrant of left female breast: Secondary | ICD-10-CM

## 2017-07-23 DIAGNOSIS — Z170 Estrogen receptor positive status [ER+]: Secondary | ICD-10-CM | POA: Diagnosis not present

## 2017-07-23 DIAGNOSIS — I340 Nonrheumatic mitral (valve) insufficiency: Secondary | ICD-10-CM | POA: Insufficient documentation

## 2017-07-23 DIAGNOSIS — Z5112 Encounter for antineoplastic immunotherapy: Secondary | ICD-10-CM

## 2017-07-23 DIAGNOSIS — I10 Essential (primary) hypertension: Secondary | ICD-10-CM | POA: Insufficient documentation

## 2017-07-23 NOTE — Progress Notes (Signed)
*  PRELIMINARY RESULTS* Echocardiogram 2D Echocardiogram has been performed.  Wallie Char Stiven Kaspar 07/23/2017, 10:37 AM

## 2017-07-26 ENCOUNTER — Other Ambulatory Visit: Payer: Self-pay

## 2017-07-26 ENCOUNTER — Ambulatory Visit
Admission: RE | Admit: 2017-07-26 | Discharge: 2017-07-26 | Disposition: A | Discharge status: Home / Self Care | Payer: Medicare HMO | Source: Ambulatory Visit | Attending: Radiation Oncology | Admitting: Radiation Oncology

## 2017-07-26 ENCOUNTER — Encounter: Payer: Self-pay | Admitting: Radiation Oncology

## 2017-07-26 VITALS — BP 130/77 | HR 66 | Temp 96.2°F | Resp 18 | Wt 121.1 lb

## 2017-07-26 DIAGNOSIS — C50212 Malignant neoplasm of upper-inner quadrant of left female breast: Secondary | ICD-10-CM | POA: Insufficient documentation

## 2017-07-26 DIAGNOSIS — Z170 Estrogen receptor positive status [ER+]: Secondary | ICD-10-CM | POA: Diagnosis not present

## 2017-07-26 DIAGNOSIS — Z923 Personal history of irradiation: Secondary | ICD-10-CM | POA: Insufficient documentation

## 2017-07-26 DIAGNOSIS — Z79811 Long term (current) use of aromatase inhibitors: Secondary | ICD-10-CM | POA: Insufficient documentation

## 2017-07-26 DIAGNOSIS — C50312 Malignant neoplasm of lower-inner quadrant of left female breast: Secondary | ICD-10-CM

## 2017-07-26 NOTE — Progress Notes (Signed)
Radiation Oncology Follow up Note  Name: Doris Barnes   Date:   07/26/2017 MRN:  [id number] DOB: 1940/06/12    This 77 y.o. female presents to the clinic today for one-month follow-up status post whole breast radiation to the left breast for stage I ER negative PR weakly positiveHER-2/neu overexpressed invasive mammary carcinoma status post wide local excision and adjuvant chemotherapy.Marland Kitchen  REFERRING PROVIDER: Rusty Aus, MD  HPI: patient is a 77 year old female now seen out 1 month having completed whole breast radiation to her left breast for stage I ER negative PR weakly positive and HER-2/neu overexpressed invasive mammary carcinoma seen today in routine follow-up she is doing well..she's been started on arimadex tolerating that well without side effect. Some still some slight tenderness in her left breast.  COMPLICATIONS OF TREATMENT: none  FOLLOW UP COMPLIANCE: keeps appointments   PHYSICAL EXAM:  BP 130/77   Pulse 66   Temp (!) 96.2 F (35.7 C)   Resp 18   Wt 121 lb 2.3 oz (54.9 kg)   BMI 22.16 kg/m  Lungs are clear to A&P cardiac examination essentially unremarkable with regular rate and rhythm. No dominant mass or nodularity is noted in either breast in 2 positions examined. Incision is well-healed. No axillary or supraclavicular adenopathy is appreciated. Cosmetic result is excellent. Well-developed well-nourished patient in NAD. HEENT reveals PERLA, EOMI, discs not visualized.  Oral cavity is clear. No oral mucosal lesions are identified. Neck is clear without evidence of cervical or supraclavicular adenopathy. Lungs are clear to A&P. Cardiac examination is essentially unremarkable with regular rate and rhythm without murmur rub or thrill. Abdomen is benign with no organomegaly or masses noted. Motor sensory and DTR levels are equal and symmetric in the upper and lower extremities. Cranial nerves II through XII are grossly intact. Proprioception is intact. No peripheral  adenopathy or edema is identified. No motor or sensory levels are noted. Crude visual fields are within normal range.  RADIOLOGY RESULTS: no current films for review PLAN: at this time patient is doing well. She continues on arimadex without side effect. I'm please were overall progress. I've asked to see her out in 4-5 months for follow-up. Patient is to call sooner with any concerns. Anticipate a mammogram around 6 months out.  I would like to take this opportunity to thank you for allowing me to participate in the care of your patient.Noreene Filbert, MD

## 2017-07-27 ENCOUNTER — Inpatient Hospital Stay: Payer: Medicare HMO | Attending: Oncology

## 2017-07-27 ENCOUNTER — Other Ambulatory Visit: Payer: Self-pay | Admitting: Oncology

## 2017-07-27 DIAGNOSIS — Z9221 Personal history of antineoplastic chemotherapy: Secondary | ICD-10-CM | POA: Insufficient documentation

## 2017-07-27 DIAGNOSIS — Z923 Personal history of irradiation: Secondary | ICD-10-CM | POA: Diagnosis not present

## 2017-07-27 DIAGNOSIS — Z79811 Long term (current) use of aromatase inhibitors: Secondary | ICD-10-CM | POA: Diagnosis not present

## 2017-07-27 DIAGNOSIS — Z171 Estrogen receptor negative status [ER-]: Secondary | ICD-10-CM | POA: Insufficient documentation

## 2017-07-27 DIAGNOSIS — I341 Nonrheumatic mitral (valve) prolapse: Secondary | ICD-10-CM | POA: Diagnosis not present

## 2017-07-27 DIAGNOSIS — Z5112 Encounter for antineoplastic immunotherapy: Secondary | ICD-10-CM | POA: Insufficient documentation

## 2017-07-27 DIAGNOSIS — Z1501 Genetic susceptibility to malignant neoplasm of breast: Secondary | ICD-10-CM | POA: Insufficient documentation

## 2017-07-27 DIAGNOSIS — Z87891 Personal history of nicotine dependence: Secondary | ICD-10-CM | POA: Diagnosis not present

## 2017-07-27 DIAGNOSIS — C50312 Malignant neoplasm of lower-inner quadrant of left female breast: Secondary | ICD-10-CM | POA: Insufficient documentation

## 2017-07-27 DIAGNOSIS — Z170 Estrogen receptor positive status [ER+]: Secondary | ICD-10-CM

## 2017-07-27 MED ORDER — DIPHENHYDRAMINE HCL 25 MG PO CAPS
ORAL_CAPSULE | ORAL | Status: AC
  Filled 2017-07-27: qty 1

## 2017-07-27 MED ORDER — SODIUM CHLORIDE 0.9 % IV SOLN
Freq: Once | INTRAVENOUS | Status: AC
  Administered 2017-07-27: 14:00:00 via INTRAVENOUS
  Filled 2017-07-27: qty 1000

## 2017-07-27 MED ORDER — ACETAMINOPHEN 325 MG PO TABS
ORAL_TABLET | ORAL | Status: AC
  Filled 2017-07-27: qty 2

## 2017-07-27 MED ORDER — ACETAMINOPHEN 325 MG PO TABS
650.0000 mg | ORAL_TABLET | Freq: Once | ORAL | Status: AC
  Administered 2017-07-27: 650 mg via ORAL

## 2017-07-27 MED ORDER — TRASTUZUMAB CHEMO 150 MG IV SOLR
6.0000 mg/kg | Freq: Once | INTRAVENOUS | Status: AC
  Administered 2017-07-27: 336 mg via INTRAVENOUS
  Filled 2017-07-27: qty 16

## 2017-07-27 MED ORDER — HEPARIN SOD (PORK) LOCK FLUSH 100 UNIT/ML IV SOLN
500.0000 [IU] | Freq: Once | INTRAVENOUS | Status: AC | PRN
  Administered 2017-07-27: 500 [IU]
  Filled 2017-07-27: qty 5

## 2017-07-27 MED ORDER — DIPHENHYDRAMINE HCL 25 MG PO CAPS
25.0000 mg | ORAL_CAPSULE | Freq: Once | ORAL | Status: AC
  Administered 2017-07-27: 25 mg via ORAL

## 2017-07-30 DIAGNOSIS — I10 Essential (primary) hypertension: Secondary | ICD-10-CM | POA: Diagnosis not present

## 2017-07-30 DIAGNOSIS — E782 Mixed hyperlipidemia: Secondary | ICD-10-CM | POA: Diagnosis not present

## 2017-07-30 DIAGNOSIS — I25119 Atherosclerotic heart disease of native coronary artery with unspecified angina pectoris: Secondary | ICD-10-CM | POA: Diagnosis not present

## 2017-07-30 DIAGNOSIS — I341 Nonrheumatic mitral (valve) prolapse: Secondary | ICD-10-CM | POA: Diagnosis not present

## 2017-08-17 ENCOUNTER — Encounter: Payer: Self-pay | Admitting: Nurse Practitioner

## 2017-08-17 ENCOUNTER — Inpatient Hospital Stay: Payer: Medicare HMO

## 2017-08-17 ENCOUNTER — Inpatient Hospital Stay (HOSPITAL_BASED_OUTPATIENT_CLINIC_OR_DEPARTMENT_OTHER): Payer: Medicare HMO | Admitting: Nurse Practitioner

## 2017-08-17 VITALS — BP 143/79 | HR 62 | Temp 99.5°F | Resp 18 | Ht 62.0 in | Wt 121.4 lb

## 2017-08-17 DIAGNOSIS — Z79811 Long term (current) use of aromatase inhibitors: Secondary | ICD-10-CM

## 2017-08-17 DIAGNOSIS — Z923 Personal history of irradiation: Secondary | ICD-10-CM

## 2017-08-17 DIAGNOSIS — Z171 Estrogen receptor negative status [ER-]: Secondary | ICD-10-CM | POA: Diagnosis not present

## 2017-08-17 DIAGNOSIS — Z87891 Personal history of nicotine dependence: Secondary | ICD-10-CM | POA: Diagnosis not present

## 2017-08-17 DIAGNOSIS — Z1501 Genetic susceptibility to malignant neoplasm of breast: Secondary | ICD-10-CM | POA: Diagnosis not present

## 2017-08-17 DIAGNOSIS — I341 Nonrheumatic mitral (valve) prolapse: Secondary | ICD-10-CM

## 2017-08-17 DIAGNOSIS — Z170 Estrogen receptor positive status [ER+]: Principal | ICD-10-CM

## 2017-08-17 DIAGNOSIS — Z5112 Encounter for antineoplastic immunotherapy: Secondary | ICD-10-CM

## 2017-08-17 DIAGNOSIS — Z9221 Personal history of antineoplastic chemotherapy: Secondary | ICD-10-CM | POA: Diagnosis not present

## 2017-08-17 DIAGNOSIS — C50312 Malignant neoplasm of lower-inner quadrant of left female breast: Secondary | ICD-10-CM | POA: Diagnosis not present

## 2017-08-17 LAB — COMPREHENSIVE METABOLIC PANEL
ALT: 21 U/L (ref 14–54)
AST: 27 U/L (ref 15–41)
Albumin: 3.6 g/dL (ref 3.5–5.0)
Alkaline Phosphatase: 65 U/L (ref 38–126)
Anion gap: 10 (ref 5–15)
BUN: 13 mg/dL (ref 6–20)
CO2: 26 mmol/L (ref 22–32)
Calcium: 9.1 mg/dL (ref 8.9–10.3)
Chloride: 97 mmol/L — ABNORMAL LOW (ref 101–111)
Creatinine, Ser: 0.56 mg/dL (ref 0.44–1.00)
GFR calc Af Amer: >60 mL/min (ref >60)
GFR calc non Af Amer: >60 mL/min (ref >60)
Glucose, Bld: 122 mg/dL — ABNORMAL HIGH (ref 65–99)
Potassium: 3.9 mmol/L (ref 3.5–5.1)
Sodium: 133 mmol/L — ABNORMAL LOW (ref 135–145)
Total Bilirubin: 0.7 mg/dL (ref 0.3–1.2)
Total Protein: 6.8 g/dL (ref 6.5–8.1)

## 2017-08-17 LAB — CBC WITH DIFFERENTIAL/PLATELET
Basophils Absolute: 0 10*3/uL (ref 0–0.1)
Basophils Relative: 1 %
Eosinophils Absolute: 0.2 10*3/uL (ref 0–0.7)
Eosinophils Relative: 3 %
HCT: 33.2 % — ABNORMAL LOW (ref 35.0–47.0)
Hemoglobin: 11.2 g/dL — ABNORMAL LOW (ref 12.0–16.0)
Lymphocytes Relative: 18 %
Lymphs Abs: 1 10*3/uL (ref 1.0–3.6)
MCH: 28.1 pg (ref 26.0–34.0)
MCHC: 33.9 g/dL (ref 32.0–36.0)
MCV: 83 fL (ref 80.0–100.0)
Monocytes Absolute: 0.6 10*3/uL (ref 0.2–0.9)
Monocytes Relative: 10 %
Neutro Abs: 3.8 10*3/uL (ref 1.4–6.5)
Neutrophils Relative %: 68 %
Platelets: 209 10*3/uL (ref 150–440)
RBC: 4 MIL/uL (ref 3.80–5.20)
RDW: 14.9 % — ABNORMAL HIGH (ref 11.5–14.5)
WBC: 5.5 10*3/uL (ref 3.6–11.0)

## 2017-08-17 MED ORDER — SODIUM CHLORIDE 0.9% FLUSH
10.0000 mL | INTRAVENOUS | Status: DC | PRN
  Administered 2017-08-17: 10 mL via INTRAVENOUS
  Filled 2017-08-17: qty 10

## 2017-08-17 MED ORDER — ACETAMINOPHEN 325 MG PO TABS
650.0000 mg | ORAL_TABLET | Freq: Once | ORAL | Status: AC
  Administered 2017-08-17: 650 mg via ORAL
  Filled 2017-08-17: qty 2

## 2017-08-17 MED ORDER — TRASTUZUMAB CHEMO 150 MG IV SOLR
6.0000 mg/kg | Freq: Once | INTRAVENOUS | Status: AC
  Administered 2017-08-17: 336 mg via INTRAVENOUS
  Filled 2017-08-17: qty 16

## 2017-08-17 MED ORDER — SODIUM CHLORIDE 0.9 % IV SOLN
Freq: Once | INTRAVENOUS | Status: AC
  Administered 2017-08-17: 10:00:00 via INTRAVENOUS
  Filled 2017-08-17: qty 1000

## 2017-08-17 MED ORDER — DIPHENHYDRAMINE HCL 25 MG PO CAPS
25.0000 mg | ORAL_CAPSULE | Freq: Once | ORAL | Status: AC
  Administered 2017-08-17: 25 mg via ORAL
  Filled 2017-08-17: qty 1

## 2017-08-17 MED ORDER — EXEMESTANE 25 MG PO TABS: 25.0000 mg | ORAL_TABLET | Freq: Every day | ORAL | 0 refills | Status: DC

## 2017-08-17 MED ORDER — HEPARIN SOD (PORK) LOCK FLUSH 100 UNIT/ML IV SOLN
500.0000 [IU] | Freq: Once | INTRAVENOUS | Status: AC
  Administered 2017-08-17: 500 [IU] via INTRAVENOUS
  Filled 2017-08-17: qty 5

## 2017-08-17 NOTE — Progress Notes (Signed)
Hematology/Oncology Consult note [facility]  Telephone:([phone] [phone]  Patient Care Team: Rusty Aus, MD as PCP - General (Internal Medicine)   Name of the patient: Doris Barnes  [id number]  September 30, 1940   Date of visit: 08/17/17  Diagnosis- Stage IA invasive mammary carcinoma of the left breast ER negative, PR weakly positive and HER-2 positive  Chief complaint/ Reason for visit- on treatment assessment prior tocycle5of Q3 weekly herceptin   Heme/Onc history:Primary medical oncologist, Dr. Janese Banks Oncology History   1. Patient is a 77 year old female who underwent bilateral screening mammogram on 11/28/2016 which showed a possible mass and calcifications in her left breast. This was followed by diagnostic mammogram and an ultrasound of the left breast which showed: 1.4 x 1.2 x 1.3 cm irregular mass in the left breast at the 6:30 position 3 cm from the nipple. On magnification views the overall extent of the mass and calcifications measures 1.9 cm. Left axilla was evaluated with ultrasound and showed no enlarged and morphologically abnormal lymph nodes.   2. She underwent core biopsy of the left breast mass which showed: Invasive mammary carcinoma, grade 3. No DCIS or lymphovascular invasion. ER was negative and PR was 11-50% positive and HER-2 was +3 on IHC  3Menarche at the age of 83 years. G5 P5 L5. She did have twoprior biopsiesof her left breast which did not reveal any malignancy. No family history of breast or ovarian cancer. Her sister and maternal grandfather had colon cancer. She is considerably anxious today at the thought of getting chemotherapy. She does have a history of mitral valve prolapse and states that she has "irregular heartbeat"for which she is on atenolol. She had a Holter monitor in the past but was not found to have any arrhythmia.Baseline MUGA scan showed a normal EF of 64.5%  4. Patient underwent lumpectomy on  01/01/2017 which showed invasive mammary carcinoma, size of tumor was 14 mm, grade 3 with negative margins. 0 out of one lymph nodes was positive for malignancy. There was associated DCIS. Lymphovascular invasion was negative. ER negative, PR 11-50% positive and HER-2/neu +3  5. Adjuvant taxol herceptin chemo started on 01/16/17.Patient had significant fatigue abdominal bloating and Cramping as well as nosebleeds from Taxol and did not wish to continue with Taxol. She received 5 cycles of weekly Taxol. chemotherapy was held for 3 weeks and did not wish to continue with chemotherapy at that time.Trial of Abraxane was given for cycle #9 of treatment which she tolerated very well.  6. Given that she had PR positive disease- risks benefits of hormone therapy discussed. arimidex started on 05/04/17 along with ca/vit D  7. Completed adjuvant whole breast radiation on 06/12/2017     Breast cancer ([place])   12/21/2016 Initial Diagnosis    Breast cancer ([place])       Interval history- Today, she reports tolerating herceptin well. She has stopped taking Arimidex due to frequent leg cramps that interfere with sleep. Otherwise she feels well and denies other specific complaints.   ECOG PS- 0 Pain scale- 0   Review of systems- Review of Systems  Constitutional: Negative for chills, fever, malaise/fatigue and weight loss.  HENT: Negative for congestion, ear discharge and nosebleeds.   Eyes: Negative for blurred vision.  Respiratory: Negative for cough, hemoptysis, sputum production, shortness of breath and wheezing.   Cardiovascular: Negative for chest pain, palpitations, orthopnea and claudication.  Gastrointestinal: Negative for abdominal pain, blood in stool, constipation, diarrhea, heartburn, melena, nausea and  vomiting.  Genitourinary: Negative for dysuria, flank pain, frequency, hematuria and urgency.  Musculoskeletal: Negative for back pain, joint pain and myalgias.       Leg cramps- improved  since stopping arimidex  Skin: Negative for rash.  Neurological: Negative for dizziness, tingling, focal weakness, seizures, weakness and headaches.  Endo/Heme/Allergies: Does not bruise/bleed easily.  Psychiatric/Behavioral: Negative for depression and suicidal ideas. The patient does not have insomnia.       Allergies  Allergen Reactions  . Imipramine Pamoate Shortness Of Breath  . Bisphosphonates Nausea Only  . Epinephrine Other (See Comments)    Difficulty breathing  . Prednisone Palpitations  . Sulfa Antibiotics Rash    Face turns red and stings     Past Medical History:  Diagnosis Date  . Anemia   . Anxiety   . Asthma   . Breast cancer ([place])   . Cancer ([place]) skin  . Chronic vulvitis   . Colon polyp   . Cystocele   . Diverticulitis   . Diverticulitis   . Diverticulosis   . Diverticulosis   . Dyspareunia, female   . Dyspnea   . Dysrhythmia   . Fibrocystic disease of both breasts   . Gastritis   . GERD (gastroesophageal reflux disease)    gastritis also  . Hemorrhoids   . History of hiatal hernia   . Hypercholesteremia   . Hypertension   . IBS (irritable bowel syndrome)   . IC (interstitial cystitis)   . Migraine   . Mitral valve prolapse   . OP (osteoporosis)   . Positional vertigo   . Rectocele   . Squamous cell carcinoma   . Stroke [facility])    TIA  . Vaginal atrophy      Past Surgical History:  Procedure Laterality Date  . APPENDECTOMY    . BREAST BIOPSY Left 1998   core bx- neg  . BREAST BIOPSY Left 2007   neg  . BREAST BIOPSY Left 12/14/2016   left breast US core path pending  . BREAST EXCISIONAL BIOPSY Left   . BREAST LUMPECTOMY Left 01/01/2017    INVASIVE MAMMARY CARCINOMA/Grade 3  . CARDIAC CATHETERIZATION N/A 01/18/2015   Procedure: Left Heart Cath and Coronary Angiography;  Surgeon: Teodoro Spray, MD;  Location: [place] CV LAB;  Service: Cardiovascular;  Laterality: N/A;  . CATARACT EXTRACTION W/PHACO Right 12/15/2015    Procedure: CATARACT EXTRACTION PHACO AND INTRAOCULAR LENS PLACEMENT (IOC);  Surgeon: Estill Cotta, MD;  Location: [facility] ORS;  Service: Ophthalmology;  Laterality: Right;  [place] 01:20 AP% 23.9 CDE 35.49 Fluid pack lot # [id number]  . CATARACT EXTRACTION W/PHACO Left 12/29/2015   Procedure: CATARACT EXTRACTION PHACO AND INTRAOCULAR LENS PLACEMENT ([place]);  Surgeon: Estill Cotta, MD;  Location: [facility] ORS;  Service: Ophthalmology;  Laterality: Left;  [place]  01:20 AP% 25.1 CDE 32.86 Fluid pack lot # [id number]  . COLON SURGERY    . COLONOSCOPY WITH PROPOFOL N/A 02/21/2016   Procedure: COLONOSCOPY WITH PROPOFOL;  Surgeon: Manya Silvas, MD;  Location: [facility] ENDOSCOPY;  Service: Endoscopy;  Laterality: N/A;  . CORONARY ANGIOPLASTY    . DILATION AND CURETTAGE OF UTERUS    . OOPHORECTOMY Bilateral   . PARTIAL MASTECTOMY WITH NEEDLE LOCALIZATION Left 01/01/2017   Procedure: PARTIAL MASTECTOMY WITH NEEDLE LOCALIZATION;  Surgeon: Herbert Pun, MD;  Location: [facility] ORS;  Service: General;  Laterality: Left;  . PORTACATH PLACEMENT Right 01/01/2017   Procedure: INSERTION PORT-A-CATH;  Surgeon: Herbert Pun, MD;  Location: [facility] ORS;  Service:  General;  Laterality: Right;  . SENTINEL NODE BIOPSY Left 01/01/2017   Procedure: SENTINEL NODE BIOPSY;  Surgeon: Herbert Pun, MD;  Location: [facility] ORS;  Service: General;  Laterality: Left;  . TONSILLECTOMY    . VAGINAL HYSTERECTOMY      Social History   Socioeconomic History  . Marital status: Married    Spouse name: Not on file  . Number of children: Not on file  . Years of education: Not on file  . Highest education level: Not on file  Occupational History  . Not on file  Social Needs  . Financial resource strain: Not on file  . Food insecurity:    Worry: Not on file    Inability: Not on file  . Transportation needs:    Medical: Not on file    Non-medical: Not on file  Tobacco Use  . Smoking status: Former Smoker     Packs/day: 1.00    Years: 33.00    Pack years: 33.00    Last attempt to quit: 10/17/1987    Years since quitting: 29.8  . Smokeless tobacco: Never Used  Substance and Sexual Activity  . Alcohol use: No  . Drug use: No  . Sexual activity: Yes  Lifestyle  . Physical activity:    Days per week: Not on file    Minutes per session: Not on file  . Stress: Not on file  Relationships  . Social connections:    Talks on phone: Not on file    Gets together: Not on file    Attends religious service: Not on file    Active member of club or organization: Not on file    Attends meetings of clubs or organizations: Not on file    Relationship status: Not on file  . Intimate partner violence:    Fear of current or ex partner: Not on file    Emotionally abused: Not on file    Physically abused: Not on file    Forced sexual activity: Not on file  Other Topics Concern  . Not on file  Social History Narrative  . Not on file    Family History  Problem Relation Age of Onset  . Diabetes Mother   . Diabetes Father   . Colon cancer Sister   . Diabetes Sister   . Diabetes Maternal Aunt   . Colon cancer Paternal Grandfather   . Lung cancer Brother   . Breast cancer Neg Hx   . Ovarian cancer Neg Hx      Current Outpatient Medications:  .  acetaminophen (TYLENOL) 325 MG tablet, Take 650 mg by mouth every 6 (six) hours as needed (for headaches.). , Disp: , Rfl:  .  ALPRAZolam (XANAX) 0.25 MG tablet, Take 0.25 mg by mouth at bedtime as needed for anxiety or sleep. , Disp: , Rfl:  .  aspirin EC 325 MG tablet, Take 325 mg by mouth daily with breakfast. , Disp: , Rfl:  .  atenolol (TENORMIN) 50 MG tablet, Take 50 mg by mouth daily with breakfast. , Disp: , Rfl:  .  calcium citrate-vitamin D (CITRACAL+D) 315-200 MG-UNIT tablet, Take 1 tablet by mouth 2 (two) times daily., Disp: , Rfl:  .  FLUoxetine (PROZAC) 40 MG capsule, Take 40 mg by mouth at bedtime., Disp: , Rfl:  .  fluticasone (FLONASE) 50  MCG/ACT nasal spray, Place 2 sprays into both nostrils daily as needed for allergies. , Disp: , Rfl:  .  hydrochlorothiazide (HYDRODIURIL) 25 MG tablet,  Take 25 mg by mouth daily. , Disp: , Rfl:  .  lansoprazole (PREVACID) 30 MG capsule, Take 30 mg by mouth daily before breakfast. , Disp: , Rfl:  .  loratadine (CLARITIN) 10 MG tablet, Take 10 mg by mouth daily as needed for allergies. , Disp: , Rfl:  .  Magnesium 250 MG TABS, Take 250 mg by mouth daily., Disp: , Rfl:  .  Multiple Vitamins-Minerals (PRESERVISION AREDS 2) CAPS, Take 1 capsule by mouth 2 (two) times daily. , Disp: , Rfl:  .  polyethylene glycol (MIRALAX / GLYCOLAX) packet, Take 17 g by mouth daily as needed. , Disp: , Rfl:  .  simethicone (GAS-X) 80 MG chewable tablet, Chew 1 tablet (80 mg total) every 6 (six) hours as needed by mouth for flatulence., Disp: 30 tablet, Rfl: 0 .  simvastatin (ZOCOR) 20 MG tablet, Take 20 mg by mouth daily at 8 pm., Disp: , Rfl:  .  telmisartan (MICARDIS) 80 MG tablet, Take 40 mg by mouth daily with breakfast., Disp: , Rfl:  .  anastrozole (ARIMIDEX) 1 MG tablet, Take 1 tablet (1 mg total) by mouth daily., Disp: 30 tablet, Rfl: 3 .  dicyclomine (BENTYL) 20 MG tablet, Take 1 tablet (20 mg total) by mouth 3 (three) times daily as needed for spasms. (Patient not taking: Reported on 05/25/2017), Disp: 30 tablet, Rfl: 0 .  exemestane (AROMASIN) 25 MG tablet, Take 1 tablet (25 mg total) by mouth daily after breakfast., Disp: 30 tablet, Rfl: 0 .  oxyCODONE-acetaminophen (PERCOCET) 5-325 MG tablet, Take 1-2 tablets by mouth every 8 (eight) hours as needed. (Patient not taking: Reported on 07/06/2017), Disp: 20 tablet, Rfl: 0 .  triamcinolone ointment (KENALOG) 0.5 %, Apply 1 application topically 2 (two) times daily. (Patient not taking: Reported on 07/26/2017), Disp: 30 g, Rfl: 0 No current facility-administered medications for this visit.   Facility-Administered Medications Ordered in Other Visits:  .  heparin lock  flush 100 unit/mL, 500 Units, Intravenous, Once, Randa Evens C, MD .  sodium chloride flush (NS) 0.9 % injection 10 mL, 10 mL, Intravenous, PRN, Sindy Guadeloupe, MD, 10 mL at 08/17/17 0853  Physical exam:  Vitals:   08/17/17 0907  BP: (!) 143/79  Pulse: 62  Resp: 18  Temp: 99.5 F (37.5 C)  TempSrc: Oral  Weight: 121 lb 6.4 oz (55.1 kg)  Height: 5' 2" (1.575 m)   Physical Exam  Constitutional: She is oriented to person, place, and time.  HENT:  Head: Normocephalic and atraumatic.  Eyes: Pupils are equal, round, and reactive to light. EOM are normal.  Neck: Normal range of motion.  Cardiovascular: Normal rate and regular rhythm.  Mid-systolic click (hx of MVP)  Pulmonary/Chest: Effort normal and breath sounds normal.  Abdominal: Soft. Bowel sounds are normal.  Neurological: She is alert and oriented to person, place, and time.  Skin: Skin is warm and dry.     CMP Latest Ref Rng & Units 08/17/2017  Glucose 65 - 99 mg/dL 122(H)  BUN 6 - 20 mg/dL 13  Creatinine 0.44 - 1.00 mg/dL 0.56  Sodium 135 - 145 mmol/L 133(L)  Potassium 3.5 - 5.1 mmol/L 3.9  Chloride 101 - 111 mmol/L 97(L)  CO2 22 - 32 mmol/L 26  Calcium 8.9 - 10.3 mg/dL 9.1  Total Protein 6.5 - 8.1 g/dL 6.8  Total Bilirubin 0.3 - 1.2 mg/dL 0.7  Alkaline Phos 38 - 126 U/L 65  AST 15 - 41 U/L 27  ALT 14 -  54 U/L 21   CBC Latest Ref Rng & Units 08/17/2017  WBC 3.6 - 11.0 K/uL 5.5  Hemoglobin 12.0 - 16.0 g/dL 11.2(L)  Hematocrit 35.0 - 47.0 % 33.2(L)  Platelets 150 - 440 K/uL 209    No images are attached to the encounter.  No results found.   Assessment and plan- Patient is a 77 y.o. female with pathologicalprognostic stage IA pT1c1p N0 [phone] mammary carcinoma of the left breast ER negative PR positive and HER-2 +3 on IHC s/p lumpectomy s/p adjuvant chemotherapy here for on treatment assessment prior to cycle7of Q3 weekly adjuvant heceptin. [place] on 04/03/2017 showed LVEF of 65.2% comparable to 01/06/2017  LVEF of 64.5%. Echo on 07/23/17 showed EF of 50-55%. She completed whole breast radiation 06/12/2017.   Counts ok to proceed with adjuvant herceptin today. We will plan for [facility] 09/2017.   She has not tolerated anastrozole well d/t leg cramps and self-stopped the medication. I will switch her to a steroidal, third generation AI, Aromasin 25 mg daily today. I will give her a 30 day supply to assess tolerability. We discussed potential side effects of medication, rationale and benefit of use. Discussed goal of completing 5 years of AI treatment completing 04/2022.   We discussed the need for bone density scan in September 2019. She will continue Calcium 1200 mg and Vitamin D 800 iu at this time and was encouraged to participate in weight bearing exercise as tolerated.   She will return to clinic for CBC, CMP, and Mg and re-evaluation of Aromasin and consideration of cycle 8 of adjuvant herceptin in 3 weeks. She will then return to clinic in 6 weeks for cycle 9 of adjuvant herceptin.   Visit Diagnosis 1. Malignant neoplasm of lower-inner quadrant of left breast in female, estrogen receptor positive ([place])      Beckey Rutter, DNP, AGNP-C [facility] [place] (work cell) [phone] (office) 08/17/17 9:39 AM    CC: Dr. Janese Banks

## 2017-08-17 NOTE — Progress Notes (Signed)
Pt stopped the AI due to bad cramps all night. Now since she stopped taking medication she only has a slight cramp when she stretches in am.

## 2017-08-23 ENCOUNTER — Telehealth: Payer: Self-pay | Admitting: Oncology

## 2017-08-23 NOTE — Telephone Encounter (Signed)
Patient called to say that she has several questions. One is that her stomach has been hurting her ever since she started the exemestane.  She feels sick on her stomach and wonders what else she can take to protect her from having recurrence of breast cancer. I told her that there is another med in family of meds to try and it is letrozole and I will need to ask the covering MD about this. She has already tried anastrozole and it gave her bad leg cramps at night and was changed to the exemestane.  I told her she has to get back to feeling better with her stomach before starting a new med and she was agreeable. When she was seen last week she was not give new appts. I sent message to Verdis Frederickson to make the new appts today and maria made them and called pt.  The wrap was done 5/24 and no one checked her out. I  let her know about the appts. As well aswhile I was on the phone with her this afternoon. Third thing is that she got [facility] money but it came with a note stating that all the treatments were not turned in.  She states it covered 10/23 to 02/13/17 but she did not finish until Jan 2019. I told her that Marlowe Kays was out for a death in her family and I would check with her about this but it would be next week and she is ok with this. I did talk to Dr. B about the side effects of AI and he said that it is good idea about trying next med but she needs to get better with her stomach and since dr Janese Banks coming back from vacation on 6/3 I could ask her about it and let pt. Know and that would give pt 4 days to be off med to see if she feels better. She is agreeable to all above.

## 2017-08-23 NOTE — Telephone Encounter (Signed)
[facility]/MD/ HERCEPTIN in 3 weeks (from 05/24) per Sherry/schd msg. MF

## 2017-08-28 ENCOUNTER — Telehealth: Payer: Self-pay | Admitting: *Deleted

## 2017-08-28 ENCOUNTER — Other Ambulatory Visit: Payer: Self-pay | Admitting: *Deleted

## 2017-08-28 MED ORDER — LETROZOLE 2.5 MG PO TABS: 2.5000 mg | ORAL_TABLET | Freq: Every day | ORAL | 0 refills | Status: DC

## 2017-08-28 NOTE — Telephone Encounter (Signed)
Called pt back and she rec: to try the other med. I will send in rx for letrozole.  Patient is feeling better and her stomach back to normal. She did ask about the papers for [facility] and I did check with Marlowe Kays and she did not due the form. I told pt to bring her form and I will get connie to print off itemized report and she will bring it in on the 14th when she comes back.

## 2017-09-07 ENCOUNTER — Encounter: Payer: Self-pay | Admitting: Oncology

## 2017-09-07 ENCOUNTER — Other Ambulatory Visit: Payer: Self-pay | Admitting: *Deleted

## 2017-09-07 ENCOUNTER — Inpatient Hospital Stay: Payer: Medicare HMO | Attending: Oncology

## 2017-09-07 ENCOUNTER — Inpatient Hospital Stay (HOSPITAL_BASED_OUTPATIENT_CLINIC_OR_DEPARTMENT_OTHER): Payer: Medicare HMO | Admitting: Oncology

## 2017-09-07 ENCOUNTER — Inpatient Hospital Stay: Payer: Medicare HMO

## 2017-09-07 VITALS — BP 131/74 | HR 66 | Temp 97.3°F | Ht 62.0 in | Wt 120.9 lb

## 2017-09-07 DIAGNOSIS — Z170 Estrogen receptor positive status [ER+]: Secondary | ICD-10-CM

## 2017-09-07 DIAGNOSIS — Z9221 Personal history of antineoplastic chemotherapy: Secondary | ICD-10-CM | POA: Diagnosis not present

## 2017-09-07 DIAGNOSIS — C50312 Malignant neoplasm of lower-inner quadrant of left female breast: Secondary | ICD-10-CM

## 2017-09-07 DIAGNOSIS — E7800 Pure hypercholesterolemia, unspecified: Secondary | ICD-10-CM

## 2017-09-07 DIAGNOSIS — F419 Anxiety disorder, unspecified: Secondary | ICD-10-CM | POA: Diagnosis not present

## 2017-09-07 DIAGNOSIS — Z86010 Personal history of colonic polyps: Secondary | ICD-10-CM | POA: Insufficient documentation

## 2017-09-07 DIAGNOSIS — Z87891 Personal history of nicotine dependence: Secondary | ICD-10-CM | POA: Insufficient documentation

## 2017-09-07 DIAGNOSIS — R109 Unspecified abdominal pain: Secondary | ICD-10-CM | POA: Diagnosis not present

## 2017-09-07 DIAGNOSIS — Z79811 Long term (current) use of aromatase inhibitors: Secondary | ICD-10-CM

## 2017-09-07 DIAGNOSIS — Z7982 Long term (current) use of aspirin: Secondary | ICD-10-CM

## 2017-09-07 DIAGNOSIS — Z8719 Personal history of other diseases of the digestive system: Secondary | ICD-10-CM

## 2017-09-07 DIAGNOSIS — Z79899 Other long term (current) drug therapy: Secondary | ICD-10-CM

## 2017-09-07 DIAGNOSIS — I341 Nonrheumatic mitral (valve) prolapse: Secondary | ICD-10-CM

## 2017-09-07 DIAGNOSIS — Z800 Family history of malignant neoplasm of digestive organs: Secondary | ICD-10-CM

## 2017-09-07 DIAGNOSIS — K219 Gastro-esophageal reflux disease without esophagitis: Secondary | ICD-10-CM | POA: Diagnosis not present

## 2017-09-07 DIAGNOSIS — Z5112 Encounter for antineoplastic immunotherapy: Secondary | ICD-10-CM | POA: Diagnosis not present

## 2017-09-07 DIAGNOSIS — Z9012 Acquired absence of left breast and nipple: Secondary | ICD-10-CM | POA: Insufficient documentation

## 2017-09-07 DIAGNOSIS — Z8673 Personal history of transient ischemic attack (TIA), and cerebral infarction without residual deficits: Secondary | ICD-10-CM | POA: Diagnosis not present

## 2017-09-07 DIAGNOSIS — K449 Diaphragmatic hernia without obstruction or gangrene: Secondary | ICD-10-CM | POA: Diagnosis not present

## 2017-09-07 DIAGNOSIS — I499 Cardiac arrhythmia, unspecified: Secondary | ICD-10-CM | POA: Diagnosis not present

## 2017-09-07 LAB — COMPREHENSIVE METABOLIC PANEL
ALT: 20 U/L (ref 14–54)
AST: 23 U/L (ref 15–41)
Albumin: 3.7 g/dL (ref 3.5–5.0)
Alkaline Phosphatase: 73 U/L (ref 38–126)
Anion gap: 8 (ref 5–15)
BUN: 12 mg/dL (ref 6–20)
CO2: 27 mmol/L (ref 22–32)
Calcium: 9.1 mg/dL (ref 8.9–10.3)
Chloride: 101 mmol/L (ref 101–111)
Creatinine, Ser: 0.62 mg/dL (ref 0.44–1.00)
GFR calc Af Amer: >60 mL/min (ref >60)
GFR calc non Af Amer: >60 mL/min (ref >60)
Glucose, Bld: 104 mg/dL — ABNORMAL HIGH (ref 65–99)
Potassium: 3.8 mmol/L (ref 3.5–5.1)
Sodium: 136 mmol/L (ref 135–145)
Total Bilirubin: 0.6 mg/dL (ref 0.3–1.2)
Total Protein: 6.7 g/dL (ref 6.5–8.1)

## 2017-09-07 LAB — CBC WITH DIFFERENTIAL/PLATELET
Basophils Absolute: 0 10*3/uL (ref 0–0.1)
Basophils Relative: 1 %
Eosinophils Absolute: 0.2 10*3/uL (ref 0–0.7)
Eosinophils Relative: 4 %
HCT: 33.1 % — ABNORMAL LOW (ref 35.0–47.0)
Hemoglobin: 11.3 g/dL — ABNORMAL LOW (ref 12.0–16.0)
Lymphocytes Relative: 25 %
Lymphs Abs: 1.5 10*3/uL (ref 1.0–3.6)
MCH: 28.4 pg (ref 26.0–34.0)
MCHC: 34.1 g/dL (ref 32.0–36.0)
MCV: 83.3 fL (ref 80.0–100.0)
Monocytes Absolute: 0.6 10*3/uL (ref 0.2–0.9)
Monocytes Relative: 10 %
Neutro Abs: 3.6 10*3/uL (ref 1.4–6.5)
Neutrophils Relative %: 60 %
Platelets: 225 10*3/uL (ref 150–440)
RBC: 3.98 MIL/uL (ref 3.80–5.20)
RDW: 15.4 % — ABNORMAL HIGH (ref 11.5–14.5)
WBC: 5.9 10*3/uL (ref 3.6–11.0)

## 2017-09-07 LAB — MAGNESIUM: Magnesium: 2 mg/dL (ref 1.7–2.4)

## 2017-09-07 MED ORDER — DIPHENHYDRAMINE HCL 25 MG PO CAPS
25.0000 mg | ORAL_CAPSULE | Freq: Once | ORAL | Status: AC
  Administered 2017-09-07: 25 mg via ORAL
  Filled 2017-09-07: qty 1

## 2017-09-07 MED ORDER — TRASTUZUMAB CHEMO 150 MG IV SOLR
6.0000 mg/kg | Freq: Once | INTRAVENOUS | Status: AC
  Administered 2017-09-07: 336 mg via INTRAVENOUS
  Filled 2017-09-07: qty 16

## 2017-09-07 MED ORDER — HEPARIN SOD (PORK) LOCK FLUSH 100 UNIT/ML IV SOLN: 500.0000 [IU] | Freq: Once | INTRAVENOUS | Status: DC | PRN

## 2017-09-07 MED ORDER — HEPARIN SOD (PORK) LOCK FLUSH 100 UNIT/ML IV SOLN
500.0000 [IU] | Freq: Once | INTRAVENOUS | Status: AC
  Administered 2017-09-07: 500 [IU] via INTRAVENOUS
  Filled 2017-09-07: qty 5

## 2017-09-07 MED ORDER — SODIUM CHLORIDE 0.9% FLUSH
10.0000 mL | Freq: Once | INTRAVENOUS | Status: AC
  Administered 2017-09-07: 10 mL via INTRAVENOUS
  Filled 2017-09-07: qty 10

## 2017-09-07 MED ORDER — SODIUM CHLORIDE 0.9 % IV SOLN
Freq: Once | INTRAVENOUS | Status: AC
  Administered 2017-09-07: 15:00:00 via INTRAVENOUS
  Filled 2017-09-07: qty 1000

## 2017-09-07 MED ORDER — ACETAMINOPHEN 325 MG PO TABS
650.0000 mg | ORAL_TABLET | Freq: Once | ORAL | Status: AC
  Administered 2017-09-07: 650 mg via ORAL
  Filled 2017-09-07: qty 2

## 2017-09-07 NOTE — Progress Notes (Signed)
No new changes noted today 

## 2017-09-10 NOTE — Progress Notes (Signed)
Hematology/Oncology Consult note [facility]  [phone] [phone]  Patient Care Team: Rusty Aus, MD as PCP - General (Internal Medicine)   Name of the patient: Doris Barnes  [id number]  [birth year]   Date of visit: 09/10/17  Diagnosis- Stage IA invasive mammary carcinoma of the left breast ER negative, PR weakly positive and HER-2 positive   Chief complaint/ Reason for visit- on treatment assessment prior to cycle 7 of Q3 weekly herceptin  Heme/Onc history: 1. Patient is a 77 year old female who underwent bilateral screening mammogram on 11/28/2016 which showed a possible mass and calcifications in her left breast. This was followed by diagnostic mammogram and an ultrasound of the left breast which showed: 1.4 x 1.2 x 1.3 cm irregular mass in the left breast at the 6:30 position 3 cm from the nipple. On magnification views the overall extent of the mass and calcifications measures 1.9 cm. Left axilla was evaluated with ultrasound and showed no enlarged and morphologically abnormal lymph nodes.   2. She underwent core biopsy of the left breast mass which showed: Invasive mammary carcinoma, grade 3. No DCIS or lymphovascular invasion. ER was negative and PR was 11-50% positive and HER-2 was +3 on IHC  3Menarche at the age of 25 years. G5 P5 L5. She did have twoprior biopsiesof her left breast which did not reveal any malignancy. No family history of breast or ovarian cancer. Her sister and maternal grandfather had colon cancer. She is considerably anxious today at the thought of getting chemotherapy. She does have a history of mitral valve prolapse and states that she has "irregular heartbeat"for which she is on atenolol. She had a Holter monitor in the past but was not found to have any arrhythmia.Baseline MUGA scan showed a normal EF of 64.5%  4. Patient underwent lumpectomy on 01/01/2017 which showed invasive mammary carcinoma,  size of tumor was 14 mm, grade 3 with negative margins. 0 out of one lymph nodes was positive for malignancy. There was associated DCIS. Lymphovascular invasion was negative. ER negative, PR 11-50% positive and HER-2/neu +3  5. Adjuvant taxol herceptin chemo started on 01/16/17.Patient had significant fatigue abdominal bloating and Cramping as well as nosebleeds from Taxol and did not wish to continue with Taxol. She received 5 cycles of weekly Taxol. chemotherapy was held for 3 weeks and did not wish to continue with chemotherapy at that time.Trial of Abraxane was given for cycle #9 of treatment which she tolerated very well.  6. Given that she had PR positive disease- risks benefits of hormone therapy discussed. arimidex started on 05/04/17 along with ca/vit D. She could not tolerate both arimdiex and aromasin due to athralgias and abdominal pain   Interval history- currently she has been off romasin for about 2 weeks. Reports that she still gets intermittent abdominal pain when she is tries to mover her bowels but it is getting better. Denies other complaints  ECOG PS- 0 Pain scale- 0   Review of systems- Review of Systems  Constitutional: Negative for chills, fever, malaise/fatigue and weight loss.  HENT: Negative for congestion, ear discharge and nosebleeds.   Eyes: Negative for blurred vision.  Respiratory: Negative for cough, hemoptysis, sputum production, shortness of breath and wheezing.   Cardiovascular: Negative for chest pain, palpitations, orthopnea and claudication.  Gastrointestinal: Positive for abdominal pain. Negative for blood in stool, constipation, diarrhea, heartburn, melena, nausea and vomiting.  Genitourinary: Negative for dysuria, flank pain, frequency, hematuria and urgency.  Musculoskeletal:  Negative for back pain, joint pain and myalgias.  Skin: Negative for rash.  Neurological: Negative for dizziness, tingling, focal weakness, seizures, weakness and  headaches.  Endo/Heme/Allergies: Does not bruise/bleed easily.  Psychiatric/Behavioral: Negative for depression and suicidal ideas. The patient does not have insomnia.       Allergies  Allergen Reactions  . Imipramine Pamoate Shortness Of Breath  . Bisphosphonates Nausea Only  . Epinephrine Other (See Comments)    Difficulty breathing  . Prednisone Palpitations  . Sulfa Antibiotics Rash    Face turns red and stings     Past Medical History:  Diagnosis Date  . Anemia   . Anxiety   . Asthma   . Breast cancer ([place])   . Cancer ([place]) skin  . Chronic vulvitis   . Colon polyp   . Cystocele   . Diverticulitis   . Diverticulitis   . Diverticulosis   . Diverticulosis   . Dyspareunia, female   . Dyspnea   . Dysrhythmia   . Fibrocystic disease of both breasts   . Gastritis   . GERD (gastroesophageal reflux disease)    gastritis also  . Hemorrhoids   . History of hiatal hernia   . Hypercholesteremia   . Hypertension   . IBS (irritable bowel syndrome)   . IC (interstitial cystitis)   . Migraine   . Mitral valve prolapse   . OP (osteoporosis)   . Positional vertigo   . Rectocele   . Squamous cell carcinoma   . Stroke [facility])    TIA  . Vaginal atrophy      Past Surgical History:  Procedure Laterality Date  . APPENDECTOMY    . BREAST BIOPSY Left 1998   core bx- neg  . BREAST BIOPSY Left 2007   neg  . BREAST BIOPSY Left 12/14/2016   left breast US core path pending  . BREAST EXCISIONAL BIOPSY Left   . BREAST LUMPECTOMY Left 01/01/2017    INVASIVE MAMMARY CARCINOMA/Grade 3  . CARDIAC CATHETERIZATION N/A 01/18/2015   Procedure: Left Heart Cath and Coronary Angiography;  Surgeon: Teodoro Spray, MD;  Location: [place] [facility];  Service: Cardiovascular;  Laterality: N/A;  . CATARACT EXTRACTION W/PHACO Right 12/15/2015   Procedure: CATARACT EXTRACTION PHACO AND INTRAOCULAR LENS PLACEMENT (IOC);  Surgeon: Estill Cotta, MD;  Location: [facility];  Service:  Ophthalmology;  Laterality: Right;  [place] 01:20 AP% 23.9 CDE 35.49 Fluid pack lot # [id number]  . CATARACT EXTRACTION W/PHACO Left 12/29/2015   Procedure: CATARACT EXTRACTION PHACO AND INTRAOCULAR LENS PLACEMENT ([place]);  Surgeon: Estill Cotta, MD;  Location: [facility];  Service: Ophthalmology;  Laterality: Left;  [place]  01:20 AP% 25.1 CDE 32.86 Fluid pack lot # [id number]  . COLON SURGERY    . COLONOSCOPY WITH PROPOFOL N/A 02/21/2016   Procedure: COLONOSCOPY WITH PROPOFOL;  Surgeon: Manya Silvas, MD;  Location: [facility];  Service: Endoscopy;  Laterality: N/A;  . CORONARY ANGIOPLASTY    . DILATION AND CURETTAGE OF UTERUS    . OOPHORECTOMY Bilateral   . PARTIAL MASTECTOMY WITH NEEDLE LOCALIZATION Left 01/01/2017   Procedure: PARTIAL MASTECTOMY WITH NEEDLE LOCALIZATION;  Surgeon: Herbert Pun, MD;  Location: [facility];  Service: General;  Laterality: Left;  . PORTACATH PLACEMENT Right 01/01/2017   Procedure: INSERTION PORT-A-CATH;  Surgeon: Herbert Pun, MD;  Location: [facility] OR[facility];  Service: General;  Laterality: Right;  . SENTINEL NODE BIOPSY Left 01/01/2017   Procedure: SENTINEL NODE BIOPSY;  Surgeon: Herbert Pun, MD;  Location: [facility];  Service: General;  Laterality: Left;  . TONSILLECTOMY    . VAGINAL HYSTERECTOMY      Social History   Socioeconomic History  . Marital status: Married    Spouse name: Not on file  . Number of children: Not on file  . Years of education: Not on file  . Highest education level: Not on file  Occupational History  . Not on file  Social Needs  . Financial resource strain: Not on file  . Food insecurity:    Worry: Not on file    Inability: Not on file  . Transportation needs:    Medical: Not on file    Non-medical: Not on file  Tobacco Use  . Smoking status: Former Smoker    Packs/day: 1.00    Years: 33.00    Pack years: 33.00    Last attempt to quit: 10/17/1987    Years since quitting: 29.9  . Smokeless tobacco:  Never Used  Substance and Sexual Activity  . Alcohol use: No  . Drug use: No  . Sexual activity: Yes  Lifestyle  . Physical activity:    Days per week: Not on file    Minutes per session: Not on file  . Stress: Not on file  Relationships  . Social connections:    Talks on phone: Not on file    Gets together: Not on file    Attends religious service: Not on file    Active member of club or organization: Not on file    Attends meetings of clubs or organizations: Not on file    Relationship status: Not on file  . Intimate partner violence:    Fear of current or ex partner: Not on file    Emotionally abused: Not on file    Physically abused: Not on file    Forced sexual activity: Not on file  Other Topics Concern  . Not on file  Social History Narrative  . Not on file    Family History  Problem Relation Age of Onset  . Diabetes Mother   . Diabetes Father   . Colon cancer Sister   . Diabetes Sister   . Diabetes Maternal Aunt   . Colon cancer Paternal Grandfather   . Lung cancer Brother   . Breast cancer Neg Hx   . Ovarian cancer Neg Hx      Current Outpatient Medications:  .  ALPRAZolam (XANAX) 0.25 MG tablet, Take 0.25 mg by mouth at bedtime as needed for anxiety or sleep. , Disp: , Rfl:  .  aspirin EC 325 MG tablet, Take 325 mg by mouth daily with breakfast. , Disp: , Rfl:  .  atenolol (TENORMIN) 50 MG tablet, Take 50 mg by mouth daily with breakfast. , Disp: , Rfl:  .  calcium citrate-vitamin D (CITRACAL+D) 315-200 MG-UNIT tablet, Take 1 tablet by mouth 2 (two) times daily., Disp: , Rfl:  .  FLUoxetine (PROZAC) 40 MG capsule, Take 40 mg by mouth at bedtime., Disp: , Rfl:  .  hydrochlorothiazide (HYDRODIURIL) 25 MG tablet, Take 25 mg by mouth daily. , Disp: , Rfl:  .  lansoprazole (PREVACID) 30 MG capsule, Take 30 mg by mouth daily before breakfast. , Disp: , Rfl:  .  loratadine (CLARITIN) 10 MG tablet, Take 10 mg by mouth daily as needed for allergies. , Disp: , Rfl:    .  Magnesium 250 MG TABS, Take 250 mg by mouth daily., Disp: , Rfl:  .  Multiple Vitamins-Minerals (PRESERVISION AREDS  2) CAPS, Take 1 capsule by mouth 2 (two) times daily. , Disp: , Rfl:  .  simvastatin (ZOCOR) 20 MG tablet, Take 20 mg by mouth daily at 8 pm., Disp: , Rfl:  .  telmisartan (MICARDIS) 80 MG tablet, Take 40 mg by mouth daily with breakfast., Disp: , Rfl:  .  acetaminophen (TYLENOL) 325 MG tablet, Take 650 mg by mouth every 6 (six) hours as needed (for headaches.). , Disp: , Rfl:  .  anastrozole (ARIMIDEX) 1 MG tablet, Take 1 tablet (1 mg total) by mouth daily. (Patient not taking: Reported on 09/07/2017), Disp: 30 tablet, Rfl: 3 .  dicyclomine (BENTYL) 20 MG tablet, Take 1 tablet (20 mg total) by mouth 3 (three) times daily as needed for spasms. (Patient not taking: Reported on 05/25/2017), Disp: 30 tablet, Rfl: 0 .  fluticasone (FLONASE) 50 MCG/ACT nasal spray, Place 2 sprays into both nostrils daily as needed for allergies. , Disp: , Rfl:  .  letrozole (FEMARA) 2.5 MG tablet, Take 1 tablet (2.5 mg total) by mouth daily. (Patient not taking: Reported on 09/07/2017), Disp: 30 tablet, Rfl: 0 .  oxyCODONE-acetaminophen (PERCOCET) 5-325 MG tablet, Take 1-2 tablets by mouth every 8 (eight) hours as needed. (Patient not taking: Reported on 07/06/2017), Disp: 20 tablet, Rfl: 0 .  polyethylene glycol (MIRALAX / GLYCOLAX) packet, Take 17 g by mouth daily as needed. , Disp: , Rfl:  .  simethicone (GAS-X) 80 MG chewable tablet, Chew 1 tablet (80 mg total) every 6 (six) hours as needed by mouth for flatulence. (Patient not taking: Reported on 09/07/2017), Disp: 30 tablet, Rfl: 0 .  triamcinolone ointment (KENALOG) 0.5 %, Apply 1 application topically 2 (two) times daily. (Patient not taking: Reported on 07/26/2017), Disp: 30 g, Rfl: 0  Physical exam:  Vitals:   09/07/17 1350  BP: 131/74  Pulse: 66  Temp: (!) 97.3 F (36.3 C)  TempSrc: Tympanic  SpO2: 96%  Weight: 120 lb 14.4 oz (54.8 kg)   Height: '5\' 2"'$  (1.575 m)  PF: (!) 18 L/min   Physical Exam  Constitutional: She is oriented to person, place, and time.  She is thin. Does not appear to be in any acute distress  HENT:  Head: Normocephalic and atraumatic.  Eyes: Pupils are equal, round, and reactive to light. EOM are normal.  Neck: Normal range of motion.  Cardiovascular: Normal rate, regular rhythm and normal heart sounds.  Pulmonary/Chest: Effort normal and breath sounds normal.  Abdominal: Soft. Bowel sounds are normal.  Neurological: She is alert and oriented to person, place, and time.  Skin: Skin is warm and dry.     CMP Latest Ref Rng & Units 09/07/2017  Glucose 65 - 99 mg/dL 104(H)  BUN 6 - 20 mg/dL 12  Creatinine 0.44 - 1.00 mg/dL 0.62  Sodium 135 - 145 mmol/L 136  Potassium 3.5 - 5.1 mmol/L 3.8  Chloride 101 - 111 mmol/L 101  CO2 22 - 32 mmol/L 27  Calcium 8.9 - 10.3 mg/dL 9.1  Total Protein 6.5 - 8.1 g/dL 6.7  Total Bilirubin 0.3 - 1.2 mg/dL 0.6  Alkaline Phos 38 - 126 U/L 73  AST 15 - 41 U/L 23  ALT 14 - 54 U/L 20   CBC Latest Ref Rng & Units 09/07/2017  WBC 3.6 - 11.0 K/uL 5.9  Hemoglobin 12.0 - 16.0 g/dL 11.3(L)  Hematocrit 35.0 - 47.0 % 33.1(L)  Platelets 150 - 440 K/uL 225     Assessment and plan- Patient is a  77 y.o. female pathologicalprognostic stage IA pT1c1p N0 [phone] mammary carcinoma of the left breast ER negative PR positive and HER-2 +3 on IHC s/p lumpectomy s/p adjuvant chemotherapy here for on treatment assessment prior to cycle 7 of Q3 weekly herceptin  Counts ok to proceed with cycle 7 of herceptin today. Last echo showed EF of 50-55%. Prior to that MUGA showed EF of 62%. She continues to get herceptin as the drop in EF was not >16%. Will check repeat ECHO in 6 weeks time and decide about further doses of heceptin based on that. I will see her back in 6 weeks time for cycle 9. She will directly proceed with cycl 6 of herceptin in 3 weeks  Patients tumor was ER negative  and weakly PR +. She has not been able to tolerate both arimidex and aromasin. Will attempt letrozole at this time. If her symptoms recur, she will come off hormone therapy. She is unlikely to get significant benefit with her tumor biology     Visit Diagnosis 1. Malignant neoplasm of lower-inner quadrant of left breast in female, estrogen receptor positive ([place])   2. Encounter for monoclonal antibody treatment for malignancy   3. Use of exemestane (Aromasin)      Dr. Randa Evens, MD, MPH [facility] at [facility] [phone] 09/10/2017 8:12 AM

## 2017-09-19 DIAGNOSIS — Z85828 Personal history of other malignant neoplasm of skin: Secondary | ICD-10-CM | POA: Diagnosis not present

## 2017-09-19 DIAGNOSIS — Z08 Encounter for follow-up examination after completed treatment for malignant neoplasm: Secondary | ICD-10-CM | POA: Diagnosis not present

## 2017-09-19 DIAGNOSIS — X32XXXA Exposure to sunlight, initial encounter: Secondary | ICD-10-CM | POA: Diagnosis not present

## 2017-09-19 DIAGNOSIS — L568 Other specified acute skin changes due to ultraviolet radiation: Secondary | ICD-10-CM | POA: Diagnosis not present

## 2017-09-19 DIAGNOSIS — B353 Tinea pedis: Secondary | ICD-10-CM | POA: Diagnosis not present

## 2017-09-19 DIAGNOSIS — D2272 Melanocytic nevi of left lower limb, including hip: Secondary | ICD-10-CM | POA: Diagnosis not present

## 2017-09-19 DIAGNOSIS — L7211 Pilar cyst: Secondary | ICD-10-CM | POA: Diagnosis not present

## 2017-09-19 DIAGNOSIS — L814 Other melanin hyperpigmentation: Secondary | ICD-10-CM | POA: Diagnosis not present

## 2017-09-28 ENCOUNTER — Inpatient Hospital Stay: Payer: Medicare HMO | Attending: Oncology

## 2017-09-28 VITALS — BP 138/72 | HR 78 | Temp 97.1°F | Resp 18 | Wt 121.5 lb

## 2017-09-28 DIAGNOSIS — Z7982 Long term (current) use of aspirin: Secondary | ICD-10-CM | POA: Insufficient documentation

## 2017-09-28 DIAGNOSIS — I499 Cardiac arrhythmia, unspecified: Secondary | ICD-10-CM | POA: Diagnosis not present

## 2017-09-28 DIAGNOSIS — Z86010 Personal history of colonic polyps: Secondary | ICD-10-CM | POA: Diagnosis not present

## 2017-09-28 DIAGNOSIS — F419 Anxiety disorder, unspecified: Secondary | ICD-10-CM | POA: Diagnosis not present

## 2017-09-28 DIAGNOSIS — C50312 Malignant neoplasm of lower-inner quadrant of left female breast: Secondary | ICD-10-CM | POA: Insufficient documentation

## 2017-09-28 DIAGNOSIS — Z800 Family history of malignant neoplasm of digestive organs: Secondary | ICD-10-CM | POA: Insufficient documentation

## 2017-09-28 DIAGNOSIS — Z801 Family history of malignant neoplasm of trachea, bronchus and lung: Secondary | ICD-10-CM | POA: Insufficient documentation

## 2017-09-28 DIAGNOSIS — I10 Essential (primary) hypertension: Secondary | ICD-10-CM | POA: Diagnosis not present

## 2017-09-28 DIAGNOSIS — Z8673 Personal history of transient ischemic attack (TIA), and cerebral infarction without residual deficits: Secondary | ICD-10-CM | POA: Diagnosis not present

## 2017-09-28 DIAGNOSIS — Z79899 Other long term (current) drug therapy: Secondary | ICD-10-CM | POA: Diagnosis not present

## 2017-09-28 DIAGNOSIS — Z170 Estrogen receptor positive status [ER+]: Secondary | ICD-10-CM

## 2017-09-28 DIAGNOSIS — Z8719 Personal history of other diseases of the digestive system: Secondary | ICD-10-CM | POA: Insufficient documentation

## 2017-09-28 DIAGNOSIS — R0602 Shortness of breath: Secondary | ICD-10-CM | POA: Insufficient documentation

## 2017-09-28 DIAGNOSIS — Z5112 Encounter for antineoplastic immunotherapy: Secondary | ICD-10-CM | POA: Insufficient documentation

## 2017-09-28 DIAGNOSIS — K589 Irritable bowel syndrome without diarrhea: Secondary | ICD-10-CM | POA: Diagnosis not present

## 2017-09-28 DIAGNOSIS — N3010 Interstitial cystitis (chronic) without hematuria: Secondary | ICD-10-CM | POA: Insufficient documentation

## 2017-09-28 DIAGNOSIS — J45909 Unspecified asthma, uncomplicated: Secondary | ICD-10-CM | POA: Diagnosis not present

## 2017-09-28 DIAGNOSIS — I341 Nonrheumatic mitral (valve) prolapse: Secondary | ICD-10-CM | POA: Insufficient documentation

## 2017-09-28 DIAGNOSIS — Z87891 Personal history of nicotine dependence: Secondary | ICD-10-CM | POA: Insufficient documentation

## 2017-09-28 DIAGNOSIS — K219 Gastro-esophageal reflux disease without esophagitis: Secondary | ICD-10-CM | POA: Diagnosis not present

## 2017-09-28 DIAGNOSIS — E7800 Pure hypercholesterolemia, unspecified: Secondary | ICD-10-CM | POA: Diagnosis not present

## 2017-09-28 DIAGNOSIS — K449 Diaphragmatic hernia without obstruction or gangrene: Secondary | ICD-10-CM | POA: Diagnosis not present

## 2017-09-28 DIAGNOSIS — Z171 Estrogen receptor negative status [ER-]: Secondary | ICD-10-CM | POA: Insufficient documentation

## 2017-09-28 MED ORDER — SODIUM CHLORIDE 0.9 % IV SOLN
Freq: Once | INTRAVENOUS | Status: AC
  Administered 2017-09-28: 14:00:00 via INTRAVENOUS
  Filled 2017-09-28: qty 1000

## 2017-09-28 MED ORDER — TRASTUZUMAB CHEMO 150 MG IV SOLR
6.0000 mg/kg | Freq: Once | INTRAVENOUS | Status: AC
  Administered 2017-09-28: 336 mg via INTRAVENOUS
  Filled 2017-09-28: qty 16

## 2017-09-28 MED ORDER — DIPHENHYDRAMINE HCL 25 MG PO CAPS
25.0000 mg | ORAL_CAPSULE | Freq: Once | ORAL | Status: AC
  Administered 2017-09-28: 25 mg via ORAL
  Filled 2017-09-28: qty 1

## 2017-09-28 MED ORDER — ACETAMINOPHEN 325 MG PO TABS
650.0000 mg | ORAL_TABLET | Freq: Once | ORAL | Status: AC
Start: 2017-09-28 — End: 2017-09-28
  Administered 2017-09-28: 650 mg via ORAL
  Filled 2017-09-28: qty 2

## 2017-09-28 MED ORDER — HEPARIN SOD (PORK) LOCK FLUSH 100 UNIT/ML IV SOLN
500.0000 [IU] | Freq: Once | INTRAVENOUS | Status: AC | PRN
  Administered 2017-09-28: 500 [IU]
  Filled 2017-09-28: qty 5

## 2017-09-28 MED ORDER — SODIUM CHLORIDE 0.9% FLUSH
10.0000 mL | INTRAVENOUS | Status: DC | PRN
  Filled 2017-09-28: qty 10

## 2017-10-02 ENCOUNTER — Telehealth: Payer: Self-pay

## 2017-10-02 NOTE — Telephone Encounter (Signed)
The patient call and left message on the voice mail. That she has been approved for Herceptin until 02/2018. I have contacted the patient to inform of the correct information. The patient states she has been approved for the herecptin until 02/2018.

## 2017-10-17 ENCOUNTER — Ambulatory Visit
Admission: RE | Admit: 2017-10-17 | Discharge: 2017-10-17 | Disposition: A | Discharge status: Home / Self Care | Payer: Medicare HMO | Source: Ambulatory Visit | Attending: Oncology | Admitting: Oncology

## 2017-10-17 DIAGNOSIS — R0600 Dyspnea, unspecified: Secondary | ICD-10-CM | POA: Insufficient documentation

## 2017-10-17 DIAGNOSIS — I081 Rheumatic disorders of both mitral and tricuspid valves: Secondary | ICD-10-CM | POA: Diagnosis not present

## 2017-10-17 DIAGNOSIS — C50312 Malignant neoplasm of lower-inner quadrant of left female breast: Secondary | ICD-10-CM | POA: Insufficient documentation

## 2017-10-17 DIAGNOSIS — I341 Nonrheumatic mitral (valve) prolapse: Secondary | ICD-10-CM | POA: Insufficient documentation

## 2017-10-17 DIAGNOSIS — F419 Anxiety disorder, unspecified: Secondary | ICD-10-CM | POA: Diagnosis not present

## 2017-10-17 DIAGNOSIS — E7800 Pure hypercholesterolemia, unspecified: Secondary | ICD-10-CM | POA: Diagnosis not present

## 2017-10-17 DIAGNOSIS — Z8673 Personal history of transient ischemic attack (TIA), and cerebral infarction without residual deficits: Secondary | ICD-10-CM | POA: Diagnosis not present

## 2017-10-17 DIAGNOSIS — D649 Anemia, unspecified: Secondary | ICD-10-CM | POA: Insufficient documentation

## 2017-10-17 DIAGNOSIS — I10 Essential (primary) hypertension: Secondary | ICD-10-CM | POA: Diagnosis not present

## 2017-10-17 DIAGNOSIS — Z170 Estrogen receptor positive status [ER+]: Secondary | ICD-10-CM | POA: Insufficient documentation

## 2017-10-17 DIAGNOSIS — K219 Gastro-esophageal reflux disease without esophagitis: Secondary | ICD-10-CM | POA: Insufficient documentation

## 2017-10-17 DIAGNOSIS — Z01810 Encounter for preprocedural cardiovascular examination: Secondary | ICD-10-CM | POA: Diagnosis not present

## 2017-10-17 NOTE — Progress Notes (Signed)
*  PRELIMINARY RESULTS* Echocardiogram 2D Echocardiogram has been performed.  Sherrie Sport 10/17/2017, 11:47 AM

## 2017-10-19 ENCOUNTER — Encounter: Payer: Self-pay | Admitting: Oncology

## 2017-10-19 ENCOUNTER — Inpatient Hospital Stay: Payer: Medicare HMO

## 2017-10-19 ENCOUNTER — Inpatient Hospital Stay (HOSPITAL_BASED_OUTPATIENT_CLINIC_OR_DEPARTMENT_OTHER): Payer: Medicare HMO | Admitting: Oncology

## 2017-10-19 VITALS — BP 135/68 | Temp 97.2°F | Resp 18 | Ht 62.0 in | Wt 122.1 lb

## 2017-10-19 DIAGNOSIS — K219 Gastro-esophageal reflux disease without esophagitis: Secondary | ICD-10-CM

## 2017-10-19 DIAGNOSIS — J45909 Unspecified asthma, uncomplicated: Secondary | ICD-10-CM

## 2017-10-19 DIAGNOSIS — K589 Irritable bowel syndrome without diarrhea: Secondary | ICD-10-CM

## 2017-10-19 DIAGNOSIS — Z170 Estrogen receptor positive status [ER+]: Principal | ICD-10-CM

## 2017-10-19 DIAGNOSIS — C50312 Malignant neoplasm of lower-inner quadrant of left female breast: Secondary | ICD-10-CM | POA: Diagnosis not present

## 2017-10-19 DIAGNOSIS — I341 Nonrheumatic mitral (valve) prolapse: Secondary | ICD-10-CM | POA: Diagnosis not present

## 2017-10-19 DIAGNOSIS — Z801 Family history of malignant neoplasm of trachea, bronchus and lung: Secondary | ICD-10-CM

## 2017-10-19 DIAGNOSIS — Z800 Family history of malignant neoplasm of digestive organs: Secondary | ICD-10-CM

## 2017-10-19 DIAGNOSIS — Z86010 Personal history of colonic polyps: Secondary | ICD-10-CM

## 2017-10-19 DIAGNOSIS — I499 Cardiac arrhythmia, unspecified: Secondary | ICD-10-CM

## 2017-10-19 DIAGNOSIS — Z87891 Personal history of nicotine dependence: Secondary | ICD-10-CM

## 2017-10-19 DIAGNOSIS — Z7982 Long term (current) use of aspirin: Secondary | ICD-10-CM

## 2017-10-19 DIAGNOSIS — Z5112 Encounter for antineoplastic immunotherapy: Secondary | ICD-10-CM

## 2017-10-19 DIAGNOSIS — R0602 Shortness of breath: Secondary | ICD-10-CM

## 2017-10-19 DIAGNOSIS — Z8673 Personal history of transient ischemic attack (TIA), and cerebral infarction without residual deficits: Secondary | ICD-10-CM

## 2017-10-19 DIAGNOSIS — Z79899 Other long term (current) drug therapy: Secondary | ICD-10-CM

## 2017-10-19 DIAGNOSIS — E7800 Pure hypercholesterolemia, unspecified: Secondary | ICD-10-CM

## 2017-10-19 DIAGNOSIS — I10 Essential (primary) hypertension: Secondary | ICD-10-CM

## 2017-10-19 DIAGNOSIS — K449 Diaphragmatic hernia without obstruction or gangrene: Secondary | ICD-10-CM

## 2017-10-19 DIAGNOSIS — Z171 Estrogen receptor negative status [ER-]: Secondary | ICD-10-CM

## 2017-10-19 DIAGNOSIS — N3010 Interstitial cystitis (chronic) without hematuria: Secondary | ICD-10-CM

## 2017-10-19 DIAGNOSIS — Z8719 Personal history of other diseases of the digestive system: Secondary | ICD-10-CM

## 2017-10-19 DIAGNOSIS — F419 Anxiety disorder, unspecified: Secondary | ICD-10-CM

## 2017-10-19 DIAGNOSIS — R931 Abnormal findings on diagnostic imaging of heart and coronary circulation: Secondary | ICD-10-CM

## 2017-10-19 LAB — CBC WITH DIFFERENTIAL/PLATELET
Basophils Absolute: 0 10*3/uL (ref 0–0.1)
Basophils Relative: 1 %
Eosinophils Absolute: 0.2 10*3/uL (ref 0–0.7)
Eosinophils Relative: 4 %
HCT: 32.8 % — ABNORMAL LOW (ref 35.0–47.0)
Hemoglobin: 10.9 g/dL — ABNORMAL LOW (ref 12.0–16.0)
Lymphocytes Relative: 23 %
Lymphs Abs: 1.3 10*3/uL (ref 1.0–3.6)
MCH: 28.1 pg (ref 26.0–34.0)
MCHC: 33.3 g/dL (ref 32.0–36.0)
MCV: 84.5 fL (ref 80.0–100.0)
Monocytes Absolute: 0.6 10*3/uL (ref 0.2–0.9)
Monocytes Relative: 10 %
Neutro Abs: 3.5 10*3/uL (ref 1.4–6.5)
Neutrophils Relative %: 62 %
Platelets: 217 10*3/uL (ref 150–440)
RBC: 3.88 MIL/uL (ref 3.80–5.20)
RDW: 14.5 % (ref 11.5–14.5)
WBC: 5.5 10*3/uL (ref 3.6–11.0)

## 2017-10-19 LAB — COMPREHENSIVE METABOLIC PANEL
ALT: 19 U/L (ref 0–44)
AST: 24 U/L (ref 15–41)
Albumin: 3.5 g/dL (ref 3.5–5.0)
Alkaline Phosphatase: 68 U/L (ref 38–126)
Anion gap: 8 (ref 5–15)
BUN: 14 mg/dL (ref 8–23)
CO2: 27 mmol/L (ref 22–32)
Calcium: 9 mg/dL (ref 8.9–10.3)
Chloride: 98 mmol/L (ref 98–111)
Creatinine, Ser: 0.66 mg/dL (ref 0.44–1.00)
GFR calc Af Amer: >60 mL/min (ref >60)
GFR calc non Af Amer: >60 mL/min (ref >60)
Glucose, Bld: 116 mg/dL — ABNORMAL HIGH (ref 70–99)
Potassium: 3.7 mmol/L (ref 3.5–5.1)
Sodium: 133 mmol/L — ABNORMAL LOW (ref 135–145)
Total Bilirubin: 0.6 mg/dL (ref 0.3–1.2)
Total Protein: 6.6 g/dL (ref 6.5–8.1)

## 2017-10-19 NOTE — Progress Notes (Signed)
No new changes noted today 

## 2017-10-19 NOTE — Progress Notes (Signed)
Hematology/Oncology Consult note [facility]  Telephone:([phone] [phone]  Patient Care Team: Rusty Aus, MD as PCP - General (Internal Medicine)   Name of the patient: Doris Barnes  [id number]  Mar 02, 1941   Date of visit: 10/19/17  Diagnosis- Stage IA invasive mammary carcinoma of the left breast ER negative, PR weakly positive and HER-2 positive   Chief complaint/ Reason for visit- on treatment assessment prior to cycle 8 of q3 weekly herceptin  Heme/Onc history: 1. Patient is a 77 year old female who underwent bilateral screening mammogram on 11/28/2016 which showed a possible mass and calcifications in her left breast. This was followed by diagnostic mammogram and an ultrasound of the left breast which showed: 1.4 x 1.2 x 1.3 cm irregular mass in the left breast at the 6:30 position 3 cm from the nipple. On magnification views the overall extent of the mass and calcifications measures 1.9 cm. Left axilla was evaluated with ultrasound and showed no enlarged and morphologically abnormal lymph nodes.   2. She underwent core biopsy of the left breast mass which showed: Invasive mammary carcinoma, grade 3. No DCIS or lymphovascular invasion. ER was negative and PR was 11-50% positive and HER-2 was +3 on IHC  3Menarche at the age of 57 years. G5 P5 L5. She did have twoprior biopsiesof her left breast which did not reveal any malignancy. No family history of breast or ovarian cancer. Her sister and maternal grandfather had colon cancer. She is considerably anxious today at the thought of getting chemotherapy. She does have a history of mitral valve prolapse and states that she has "irregular heartbeat"for which she is on atenolol. She had a Holter monitor in the past but was not found to have any arrhythmia.Baseline MUGA scan showed a normal EF of 64.5%  4. Patient underwent lumpectomy on 01/01/2017 which showed invasive mammary carcinoma,  size of tumor was 14 mm, grade 3 with negative margins. 0 out of one lymph nodes was positive for malignancy. There was associated DCIS. Lymphovascular invasion was negative. ER negative, PR 11-50% positive and HER-2/neu +3  5. Adjuvant taxol herceptin chemo started on 01/16/17.Patient had significant fatigue abdominal bloating and Cramping as well as nosebleeds from Taxol and did not wish to continue with Taxol. She received 5 cycles of weekly Taxol. chemotherapy was held for 3 weeks and did not wish to continue with chemotherapy at that time.Trial of Abraxane was given for cycle #9 of treatment which she tolerated very well.  6. Given that she had PR positive disease- risks benefits of hormone therapy discussed. arimidex started on 05/04/17 along with ca/vit D. She could not tolerate both arimdiex and aromasin due to athralgias and abdominal pain   Interval history- reports feeling fatigued. Has occasional swelling in her distal foot at the end of the day which resolves. Denies other complaints. She has not restarted her aromasin yet  ECOG PS- 0 Pain scale- 0  Review of systems- Review of Systems  Constitutional: Positive for malaise/fatigue. Negative for chills, fever and weight loss.  HENT: Negative for congestion, ear discharge and nosebleeds.   Eyes: Negative for blurred vision.  Respiratory: Negative for cough, hemoptysis, sputum production, shortness of breath and wheezing.   Cardiovascular: Negative for chest pain, palpitations, orthopnea and claudication.  Gastrointestinal: Negative for abdominal pain, blood in stool, constipation, diarrhea, heartburn, melena, nausea and vomiting.  Genitourinary: Negative for dysuria, flank pain, frequency, hematuria and urgency.  Musculoskeletal: Negative for back pain, joint pain and  myalgias.  Skin: Negative for rash.  Neurological: Negative for dizziness, tingling, focal weakness, seizures, weakness and headaches.  Endo/Heme/Allergies: Does  not bruise/bleed easily.  Psychiatric/Behavioral: Negative for depression and suicidal ideas. The patient does not have insomnia.       Allergies  Allergen Reactions  . Imipramine Pamoate Shortness Of Breath  . Bisphosphonates Nausea Only  . Epinephrine Other (See Comments)    Difficulty breathing  . Prednisone Palpitations  . Sulfa Antibiotics Rash    Face turns red and stings     Past Medical History:  Diagnosis Date  . Anemia   . Anxiety   . Asthma   . Breast cancer ([place])   . Cancer ([place]) skin  . Chronic vulvitis   . Colon polyp   . Cystocele   . Diverticulitis   . Diverticulitis   . Diverticulosis   . Diverticulosis   . Dyspareunia, female   . Dyspnea   . Dysrhythmia   . Fibrocystic disease of both breasts   . Gastritis   . GERD (gastroesophageal reflux disease)    gastritis also  . Hemorrhoids   . History of hiatal hernia   . Hypercholesteremia   . Hypertension   . IBS (irritable bowel syndrome)   . IC (interstitial cystitis)   . Migraine   . Mitral valve prolapse   . OP (osteoporosis)   . Positional vertigo   . Rectocele   . Squamous cell carcinoma   . Stroke [facility])    TIA  . Vaginal atrophy      Past Surgical History:  Procedure Laterality Date  . APPENDECTOMY    . BREAST BIOPSY Left 1998   core bx- neg  . BREAST BIOPSY Left 2007   neg  . BREAST BIOPSY Left 12/14/2016   left breast US core path pending  . BREAST EXCISIONAL BIOPSY Left   . BREAST LUMPECTOMY Left 01/01/2017    INVASIVE MAMMARY CARCINOMA/Grade 3  . CARDIAC CATHETERIZATION N/A 01/18/2015   Procedure: Left Heart Cath and Coronary Angiography;  Surgeon: Teodoro Spray, MD;  Location: [place] [facility];  Service: Cardiovascular;  Laterality: N/A;  . CATARACT EXTRACTION W/PHACO Right 12/15/2015   Procedure: CATARACT EXTRACTION PHACO AND INTRAOCULAR LENS PLACEMENT (IOC);  Surgeon: Estill Cotta, MD;  Location: [facility] ORS;  Service: Ophthalmology;  Laterality: Right;  [place]  01:20 AP% 23.9 CDE 35.49 Fluid pack lot # [id number]  . CATARACT EXTRACTION W/PHACO Left 12/29/2015   Procedure: CATARACT EXTRACTION PHACO AND INTRAOCULAR LENS PLACEMENT ([place]);  Surgeon: Estill Cotta, MD;  Location: [facility] OR[facility];  Service: Ophthalmology;  Laterality: Left;  [place]  01:20 AP% 25.1 CDE 32.86 Fluid pack lot # [id number]  . COLON SURGERY    . COLONOSCOPY WITH PROPOFOL N/A 02/21/2016   Procedure: COLONOSCOPY WITH PROPOFOL;  Surgeon: Manya Silvas, MD;  Location: [facility] ENDOSCOPY;  Service: Endoscopy;  Laterality: N/A;  . CORONARY ANGIOPLASTY    . DILATION AND CURETTAGE OF UTERUS    . OOPHORECTOMY Bilateral   . PARTIAL MASTECTOMY WITH NEEDLE LOCALIZATION Left 01/01/2017   Procedure: PARTIAL MASTECTOMY WITH NEEDLE LOCALIZATION;  Surgeon: Herbert Pun, MD;  Location: [facility] ORS;  Service: General;  Laterality: Left;  . PORTACATH PLACEMENT Right 01/01/2017   Procedure: INSERTION PORT-A-CATH;  Surgeon: Herbert Pun, MD;  Location: [facility] ORS;  Service: General;  Laterality: Right;  . SENTINEL NODE BIOPSY Left 01/01/2017   Procedure: SENTINEL NODE BIOPSY;  Surgeon: Herbert Pun, MD;  Location: [facility] ORS;  Service: General;  Laterality: Left;  .  TONSILLECTOMY    . VAGINAL HYSTERECTOMY      Social History   Socioeconomic History  . Marital status: Married    Spouse name: Not on file  . Number of children: Not on file  . Years of education: Not on file  . Highest education level: Not on file  Occupational History  . Not on file  Social Needs  . Financial resource strain: Not on file  . Food insecurity:    Worry: Not on file    Inability: Not on file  . Transportation needs:    Medical: Not on file    Non-medical: Not on file  Tobacco Use  . Smoking status: Former Smoker    Packs/day: 1.00    Years: 33.00    Pack years: 33.00    Last attempt to quit: 10/17/1987    Years since quitting: 30.0  . Smokeless tobacco: Never Used  Substance and Sexual Activity   . Alcohol use: No  . Drug use: No  . Sexual activity: Yes  Lifestyle  . Physical activity:    Days per week: Not on file    Minutes per session: Not on file  . Stress: Not on file  Relationships  . Social connections:    Talks on phone: Not on file    Gets together: Not on file    Attends religious service: Not on file    Active member of club or organization: Not on file    Attends meetings of clubs or organizations: Not on file    Relationship status: Not on file  . Intimate partner violence:    Fear of current or ex partner: Not on file    Emotionally abused: Not on file    Physically abused: Not on file    Forced sexual activity: Not on file  Other Topics Concern  . Not on file  Social History Narrative  . Not on file    Family History  Problem Relation Age of Onset  . Diabetes Mother   . Diabetes Father   . Colon cancer Sister   . Diabetes Sister   . Diabetes Maternal Aunt   . Colon cancer Paternal Grandfather   . Lung cancer Brother   . Breast cancer Neg Hx   . Ovarian cancer Neg Hx      Current Outpatient Medications:  .  aspirin EC 325 MG tablet, Take 325 mg by mouth daily with breakfast. , Disp: , Rfl:  .  atenolol (TENORMIN) 50 MG tablet, Take 50 mg by mouth daily with breakfast. , Disp: , Rfl:  .  calcium citrate-vitamin D (CITRACAL+D) 315-200 MG-UNIT tablet, Take 1 tablet by mouth 2 (two) times daily., Disp: , Rfl:  .  FLUoxetine (PROZAC) 40 MG capsule, Take 40 mg by mouth at bedtime., Disp: , Rfl:  .  fluticasone (FLONASE) 50 MCG/ACT nasal spray, Place 2 sprays into both nostrils daily as needed for allergies. , Disp: , Rfl:  .  hydrochlorothiazide (HYDRODIURIL) 25 MG tablet, Take 25 mg by mouth daily. , Disp: , Rfl:  .  lansoprazole (PREVACID) 30 MG capsule, Take 30 mg by mouth daily before breakfast. , Disp: , Rfl:  .  loratadine (CLARITIN) 10 MG tablet, Take 10 mg by mouth daily as needed for allergies. , Disp: , Rfl:  .  Magnesium 250 MG TABS, Take  250 mg by mouth daily., Disp: , Rfl:  .  Multiple Vitamins-Minerals (PRESERVISION AREDS 2) CAPS, Take 1 capsule by mouth 2 (two)  times daily. , Disp: , Rfl:  .  polyethylene glycol (MIRALAX / GLYCOLAX) packet, Take 17 g by mouth daily as needed. , Disp: , Rfl:  .  simvastatin (ZOCOR) 20 MG tablet, Take 20 mg by mouth daily at 8 pm., Disp: , Rfl:  .  telmisartan (MICARDIS) 80 MG tablet, Take 40 mg by mouth daily with breakfast., Disp: , Rfl:  .  acetaminophen (TYLENOL) 325 MG tablet, Take 650 mg by mouth every 6 (six) hours as needed (for headaches.). , Disp: , Rfl:  .  ALPRAZolam (XANAX) 0.25 MG tablet, Take 0.25 mg by mouth at bedtime as needed for anxiety or sleep. , Disp: , Rfl:  .  anastrozole (ARIMIDEX) 1 MG tablet, Take 1 tablet (1 mg total) by mouth daily. (Patient not taking: Reported on 09/07/2017), Disp: 30 tablet, Rfl: 3 .  dicyclomine (BENTYL) 20 MG tablet, Take 1 tablet (20 mg total) by mouth 3 (three) times daily as needed for spasms. (Patient not taking: Reported on 05/25/2017), Disp: 30 tablet, Rfl: 0 .  letrozole (FEMARA) 2.5 MG tablet, Take 1 tablet (2.5 mg total) by mouth daily. (Patient not taking: Reported on 09/07/2017), Disp: 30 tablet, Rfl: 0 .  oxyCODONE-acetaminophen (PERCOCET) 5-325 MG tablet, Take 1-2 tablets by mouth every 8 (eight) hours as needed. (Patient not taking: Reported on 07/06/2017), Disp: 20 tablet, Rfl: 0 .  simethicone (GAS-X) 80 MG chewable tablet, Chew 1 tablet (80 mg total) every 6 (six) hours as needed by mouth for flatulence. (Patient not taking: Reported on 09/07/2017), Disp: 30 tablet, Rfl: 0 .  triamcinolone ointment (KENALOG) 0.5 %, Apply 1 application topically 2 (two) times daily. (Patient not taking: Reported on 07/26/2017), Disp: 30 g, Rfl: 0  Physical exam:  Vitals:   10/19/17 1038  BP: 135/68  Resp: 18  Temp: (!) 97.2 F (36.2 C)  TempSrc: Tympanic  SpO2: 96%  Weight: 122 lb 1.6 oz (55.4 kg)  Height: _0  (1.575 m)   Physical Exam   Constitutional: She is oriented to person, place, and time.  Thin female in no acute distress  HENT:  Head: Normocephalic and atraumatic.  Eyes: Pupils are equal, round, and reactive to light. EOM are normal.  Neck: Normal range of motion.  Cardiovascular: Normal rate, regular rhythm and normal heart sounds.  Pulmonary/Chest: Effort normal and breath sounds normal.  Abdominal: Soft. Bowel sounds are normal.  Musculoskeletal: She exhibits edema (trace ankle edema).  Neurological: She is alert and oriented to person, place, and time.  Skin: Skin is warm and dry.     CMP Latest Ref Rng & Units 10/19/2017  Glucose 70 - 99 mg/dL 116(H)  BUN 8 - 23 mg/dL 14  Creatinine 0.44 - 1.00 mg/dL 0.66  Sodium 135 - 145 mmol/L 133(L)  Potassium 3.5 - 5.1 mmol/L 3.7  Chloride 98 - 111 mmol/L 98  CO2 22 - 32 mmol/L 27  Calcium 8.9 - 10.3 mg/dL 9.0  Total Protein 6.5 - 8.1 g/dL 6.6  Total Bilirubin 0.3 - 1.2 mg/dL 0.6  Alkaline Phos 38 - 126 U/L 68  AST 15 - 41 U/L 24  ALT 0 - 44 U/L 19   CBC Latest Ref Rng & Units 10/19/2017  WBC 3.6 - 11.0 K/uL 5.5  Hemoglobin 12.0 - 16.0 g/dL 10.9(L)  Hematocrit 35.0 - 47.0 % 32.8(L)  Platelets 150 - 440 K/uL 217      Assessment and plan- Patient is a 77 y.o. female pathologicalprognostic stage IA pT1c1p N0 cM0invasive mammary  carcinoma of the left breast ER negative PR positive and HER-2 +3 on IHC s/p lumpectomy s/p adjuvant chemotherapy. She is here for next cycle of adjuvant herceptin.    On reviewing patient's prior cardiac imaging her mother scan in January 2019 showed an EF of 65%.  Repeat echocardiogram in April 2019 showed an EF of 50 to 55%.  Since the drop in the ejection fraction was less than 16% Herceptin was continued and a repeat echocardiogram in July 2019 now shows EF of 45 to 50% and a borderline decrease in the left ventricular ejection fraction overall since her ejection fraction has not dropped more than 16% and has a borderline reduced  LVEF, I will plan to hold her Herceptin at this time.  I will repeat her echocardiogram in 4 weeks time and see her thereafter.  If there is no improvement in her ejection fraction she will not be rechallenged with Herceptin.  She has received about 9 months of Herceptin so far and was supposed to finish her treatment in 3 more months.  We could consider re-challenging her with Herceptin if there is an improvement in her EF.  I will also touch base with Dr. Ubaldo Glassing her primary cardiologist after her next echocardiogram  Patient has currently not restarted her Aromasin back and she will consider doing that.  We will assess how she is doing on that in about 4 weeks time and I see her back   Visit Diagnosis 1. Malignant neoplasm of lower-inner quadrant of left breast in female, estrogen receptor positive ([place])   2. Decreased cardiac ejection fraction   3. High risk medication use      Dr. Randa Evens, MD, MPH [facility] [phone] 10/19/2017 11:27 AM

## 2017-10-25 ENCOUNTER — Telehealth: Payer: Self-pay | Admitting: *Deleted

## 2017-10-25 DIAGNOSIS — K219 Gastro-esophageal reflux disease without esophagitis: Secondary | ICD-10-CM | POA: Diagnosis not present

## 2017-10-25 NOTE — Telephone Encounter (Signed)
Patient called to say that she did not receive the appt schedule in the mail for her ECHO. I reprinted it and called her and left her a message that I am sorry it did not come and I have personally reprinted it and put it in the mail. It will not even go to the post office until tom.afternoon so I assume it will take til early next week to get it and if not call me back . I did leave on the voicemail the date and time for hte ECHO. syv

## 2017-11-15 ENCOUNTER — Ambulatory Visit
Admission: RE | Admit: 2017-11-15 | Discharge: 2017-11-15 | Disposition: A | Discharge status: Home / Self Care | Payer: Medicare HMO | Source: Ambulatory Visit | Attending: Oncology | Admitting: Oncology

## 2017-11-15 DIAGNOSIS — Z170 Estrogen receptor positive status [ER+]: Secondary | ICD-10-CM | POA: Diagnosis not present

## 2017-11-15 DIAGNOSIS — R0600 Dyspnea, unspecified: Secondary | ICD-10-CM | POA: Insufficient documentation

## 2017-11-15 DIAGNOSIS — C50312 Malignant neoplasm of lower-inner quadrant of left female breast: Secondary | ICD-10-CM

## 2017-11-15 DIAGNOSIS — I080 Rheumatic disorders of both mitral and aortic valves: Secondary | ICD-10-CM | POA: Diagnosis not present

## 2017-11-15 DIAGNOSIS — Z79899 Other long term (current) drug therapy: Secondary | ICD-10-CM

## 2017-11-15 DIAGNOSIS — I10 Essential (primary) hypertension: Secondary | ICD-10-CM | POA: Insufficient documentation

## 2017-11-15 DIAGNOSIS — R931 Abnormal findings on diagnostic imaging of heart and coronary circulation: Secondary | ICD-10-CM

## 2017-11-15 MED ORDER — PERFLUTREN LIPID MICROSPHERE
1.0000 mL | INTRAVENOUS | Status: AC | PRN
  Administered 2017-11-15: 2 mL via INTRAVENOUS
  Filled 2017-11-15: qty 10

## 2017-11-15 NOTE — Progress Notes (Signed)
*  PRELIMINARY RESULTS* Echocardiogram 2D Echocardiogram has been performed.  Wallie Char Sigurd Pugh 11/15/2017, 11:08 AM

## 2017-11-16 ENCOUNTER — Encounter: Payer: Self-pay | Admitting: Oncology

## 2017-11-16 ENCOUNTER — Inpatient Hospital Stay: Payer: Medicare HMO | Attending: Oncology | Admitting: Oncology

## 2017-11-16 VITALS — BP 125/72 | HR 65 | Temp 97.5°F | Resp 18 | Ht 62.0 in | Wt 122.5 lb

## 2017-11-16 DIAGNOSIS — Z7982 Long term (current) use of aspirin: Secondary | ICD-10-CM | POA: Insufficient documentation

## 2017-11-16 DIAGNOSIS — I341 Nonrheumatic mitral (valve) prolapse: Secondary | ICD-10-CM | POA: Insufficient documentation

## 2017-11-16 DIAGNOSIS — Z79899 Other long term (current) drug therapy: Secondary | ICD-10-CM | POA: Insufficient documentation

## 2017-11-16 DIAGNOSIS — K589 Irritable bowel syndrome without diarrhea: Secondary | ICD-10-CM | POA: Diagnosis not present

## 2017-11-16 DIAGNOSIS — Z9221 Personal history of antineoplastic chemotherapy: Secondary | ICD-10-CM | POA: Insufficient documentation

## 2017-11-16 DIAGNOSIS — E7800 Pure hypercholesterolemia, unspecified: Secondary | ICD-10-CM | POA: Insufficient documentation

## 2017-11-16 DIAGNOSIS — C50312 Malignant neoplasm of lower-inner quadrant of left female breast: Secondary | ICD-10-CM | POA: Diagnosis not present

## 2017-11-16 DIAGNOSIS — J45909 Unspecified asthma, uncomplicated: Secondary | ICD-10-CM | POA: Diagnosis not present

## 2017-11-16 DIAGNOSIS — Z800 Family history of malignant neoplasm of digestive organs: Secondary | ICD-10-CM | POA: Diagnosis not present

## 2017-11-16 DIAGNOSIS — K219 Gastro-esophageal reflux disease without esophagitis: Secondary | ICD-10-CM | POA: Diagnosis not present

## 2017-11-16 DIAGNOSIS — Z171 Estrogen receptor negative status [ER-]: Secondary | ICD-10-CM | POA: Diagnosis not present

## 2017-11-16 DIAGNOSIS — Z86010 Personal history of colonic polyps: Secondary | ICD-10-CM | POA: Diagnosis not present

## 2017-11-16 DIAGNOSIS — I10 Essential (primary) hypertension: Secondary | ICD-10-CM | POA: Diagnosis not present

## 2017-11-16 DIAGNOSIS — F419 Anxiety disorder, unspecified: Secondary | ICD-10-CM | POA: Insufficient documentation

## 2017-11-16 DIAGNOSIS — N3010 Interstitial cystitis (chronic) without hematuria: Secondary | ICD-10-CM | POA: Insufficient documentation

## 2017-11-16 DIAGNOSIS — I499 Cardiac arrhythmia, unspecified: Secondary | ICD-10-CM | POA: Insufficient documentation

## 2017-11-16 DIAGNOSIS — Z170 Estrogen receptor positive status [ER+]: Secondary | ICD-10-CM

## 2017-11-16 DIAGNOSIS — K449 Diaphragmatic hernia without obstruction or gangrene: Secondary | ICD-10-CM | POA: Insufficient documentation

## 2017-11-16 DIAGNOSIS — Z8673 Personal history of transient ischemic attack (TIA), and cerebral infarction without residual deficits: Secondary | ICD-10-CM | POA: Insufficient documentation

## 2017-11-16 DIAGNOSIS — Z5112 Encounter for antineoplastic immunotherapy: Secondary | ICD-10-CM | POA: Diagnosis not present

## 2017-11-16 DIAGNOSIS — Z8719 Personal history of other diseases of the digestive system: Secondary | ICD-10-CM | POA: Insufficient documentation

## 2017-11-16 DIAGNOSIS — Z87891 Personal history of nicotine dependence: Secondary | ICD-10-CM | POA: Insufficient documentation

## 2017-11-16 DIAGNOSIS — M810 Age-related osteoporosis without current pathological fracture: Secondary | ICD-10-CM | POA: Diagnosis not present

## 2017-11-16 NOTE — Progress Notes (Signed)
No new changes noted today 

## 2017-11-17 ENCOUNTER — Encounter: Payer: Self-pay | Admitting: Emergency Medicine

## 2017-11-17 ENCOUNTER — Emergency Department
Admission: EM | Admit: 2017-11-17 | Discharge: 2017-11-17 | Disposition: A | Discharge status: Home / Self Care | Payer: Medicare HMO | Attending: Emergency Medicine | Admitting: Emergency Medicine

## 2017-11-17 DIAGNOSIS — I10 Essential (primary) hypertension: Secondary | ICD-10-CM | POA: Diagnosis not present

## 2017-11-17 DIAGNOSIS — Z87891 Personal history of nicotine dependence: Secondary | ICD-10-CM | POA: Diagnosis not present

## 2017-11-17 DIAGNOSIS — Y998 Other external cause status: Secondary | ICD-10-CM | POA: Insufficient documentation

## 2017-11-17 DIAGNOSIS — Z853 Personal history of malignant neoplasm of breast: Secondary | ICD-10-CM | POA: Diagnosis not present

## 2017-11-17 DIAGNOSIS — Z8673 Personal history of transient ischemic attack (TIA), and cerebral infarction without residual deficits: Secondary | ICD-10-CM | POA: Insufficient documentation

## 2017-11-17 DIAGNOSIS — Z79899 Other long term (current) drug therapy: Secondary | ICD-10-CM | POA: Diagnosis not present

## 2017-11-17 DIAGNOSIS — Z7982 Long term (current) use of aspirin: Secondary | ICD-10-CM | POA: Insufficient documentation

## 2017-11-17 DIAGNOSIS — Y9389 Activity, other specified: Secondary | ICD-10-CM | POA: Diagnosis not present

## 2017-11-17 DIAGNOSIS — F419 Anxiety disorder, unspecified: Secondary | ICD-10-CM | POA: Diagnosis not present

## 2017-11-17 DIAGNOSIS — Y929 Unspecified place or not applicable: Secondary | ICD-10-CM | POA: Diagnosis not present

## 2017-11-17 DIAGNOSIS — W268XXA Contact with other sharp object(s), not elsewhere classified, initial encounter: Secondary | ICD-10-CM | POA: Insufficient documentation

## 2017-11-17 DIAGNOSIS — J45909 Unspecified asthma, uncomplicated: Secondary | ICD-10-CM | POA: Diagnosis not present

## 2017-11-17 DIAGNOSIS — S61001A Unspecified open wound of right thumb without damage to nail, initial encounter: Secondary | ICD-10-CM | POA: Diagnosis not present

## 2017-11-17 DIAGNOSIS — S6991XA Unspecified injury of right wrist, hand and finger(s), initial encounter: Secondary | ICD-10-CM | POA: Diagnosis present

## 2017-11-17 DIAGNOSIS — Z23 Encounter for immunization: Secondary | ICD-10-CM | POA: Insufficient documentation

## 2017-11-17 MED ORDER — LIDOCAINE-EPINEPHRINE-TETRACAINE (LET) SOLUTION
NASAL | Status: AC
  Administered 2017-11-17: 3 mL
  Filled 2017-11-17: qty 3

## 2017-11-17 MED ORDER — TRAMADOL HCL 50 MG PO TABS: 50.0000 mg | ORAL_TABLET | Freq: Two times a day (BID) | ORAL | 0 refills | Status: DC | PRN

## 2017-11-17 MED ORDER — TRAMADOL HCL 50 MG PO TABS
50.0000 mg | ORAL_TABLET | Freq: Once | ORAL | Status: AC
  Administered 2017-11-17: 50 mg via ORAL
  Filled 2017-11-17: qty 1

## 2017-11-17 MED ORDER — NEOMYCIN-POLYMYXIN-PRAMOXINE 1 % EX CREA: TOPICAL_CREAM | Freq: Two times a day (BID) | CUTANEOUS | 0 refills | Status: DC

## 2017-11-17 MED ORDER — TETANUS-DIPHTH-ACELL PERTUSSIS 5-2.5-18.5 LF-MCG/0.5 IM SUSP
0.5000 mL | Freq: Once | INTRAMUSCULAR | Status: AC
  Administered 2017-11-17: 0.5 mL via INTRAMUSCULAR
  Filled 2017-11-17: qty 0.5

## 2017-11-17 NOTE — Discharge Instructions (Signed)
Leave dressing in place for 2 days.  Remove dressing with warm soapy water.  Pat area dry and apply Neosporin and re-bandage with Band-Aids for 3 days.  After 3 days tried to leave the wound on bandage during the day and re-bandage at night before retiring for bed until healing is complete.

## 2017-11-17 NOTE — ED Provider Notes (Signed)
[facility] Emergency Department Provider Note   ____________________________________________   First MD Initiated Contact with Patient 11/17/17 1320     (approximate)  I have reviewed the triage vital signs and the nursing notes.   HISTORY  Chief Complaint Laceration    HPI Doris Barnes is a 77 y.o. female patient presents with an avulsion to the tip of the right thumb secondary to a metal cup.  Patient state unable to control bleeding secondary to taking 325 mg aspirin daily.  Patient has lost sensation loss of function.  Patient did not know when her last tetanus shot was given.  Patient rates pain as a 7/10.  Patient described the pain is "achy".  Past Medical History:  Diagnosis Date  . Anemia   . Anxiety   . Asthma   . Breast cancer ([place])   . Cancer ([place]) skin  . Chronic vulvitis   . Colon polyp   . Cystocele   . Diverticulitis   . Diverticulitis   . Diverticulosis   . Diverticulosis   . Dyspareunia, female   . Dyspnea   . Dysrhythmia   . Fibrocystic disease of both breasts   . Gastritis   . GERD (gastroesophageal reflux disease)    gastritis also  . Hemorrhoids   . History of hiatal hernia   . Hypercholesteremia   . Hypertension   . IBS (irritable bowel syndrome)   . IC (interstitial cystitis)   . Migraine   . Mitral valve prolapse   . OP (osteoporosis)   . Positional vertigo   . Rectocele   . Squamous cell carcinoma   . Stroke [facility])    TIA  . Vaginal atrophy     Patient Active Problem List   Diagnosis Date Noted  . Encounter for monoclonal antibody treatment for malignancy 02/20/2017  . Neutropenia ([place]) 01/30/2017  . Goals of care, counseling/discussion 01/09/2017  . Breast cancer ([place]) 12/21/2016  . TIA (transient ischemic attack) 02/22/2016  . Ischemic colitis ([place]) 02/22/2016  . Psoriasiform dermatitis 01/07/2015  . Bloodgood disease 12/31/2014  . HLD (hyperlipidemia) 12/31/2014  . Headache, migraine  12/31/2014  . Billowing mitral valve 12/31/2014  . Osteoporosis, post-menopausal 12/31/2014  . Squamous cell carcinoma 12/31/2014  . Essential hypertension 09/18/2014  . Degeneration of cervical intervertebral disc 09/18/2014  . Ataxia 09/17/2014  . Adaptive colitis 02/02/2014    Past Surgical History:  Procedure Laterality Date  . APPENDECTOMY    . BREAST BIOPSY Left 1998   core bx- neg  . BREAST BIOPSY Left 2007   neg  . BREAST BIOPSY Left 12/14/2016   left breast US core path pending  . BREAST EXCISIONAL BIOPSY Left   . BREAST LUMPECTOMY Left 01/01/2017    INVASIVE MAMMARY CARCINOMA/Grade 3  . CARDIAC CATHETERIZATION N/A 01/18/2015   Procedure: Left Heart Cath and Coronary Angiography;  Surgeon: Teodoro Spray, MD;  Location: [place] CV LAB;  Service: Cardiovascular;  Laterality: N/A;  . CATARACT EXTRACTION W/PHACO Right 12/15/2015   Procedure: CATARACT EXTRACTION PHACO AND INTRAOCULAR LENS PLACEMENT (IOC);  Surgeon: Estill Cotta, MD;  Location: [facility] ORS;  Service: Ophthalmology;  Laterality: Right;  [place] 01:20 AP% 23.9 CDE 35.49 Fluid pack lot # [id number]  . CATARACT EXTRACTION W/PHACO Left 12/29/2015   Procedure: CATARACT EXTRACTION PHACO AND INTRAOCULAR LENS PLACEMENT ([place]);  Surgeon: Estill Cotta, MD;  Location: [facility] ORS;  Service: Ophthalmology;  Laterality: Left;  [place]  01:20 AP% 25.1 CDE 32.86 Fluid pack lot # [id number]  .  COLON SURGERY    . COLONOSCOPY WITH PROPOFOL N/A 02/21/2016   Procedure: COLONOSCOPY WITH PROPOFOL;  Surgeon: Manya Silvas, MD;  Location[facility] ENDOSCOPY;  Service: Endoscopy;  Laterality: N/A;  . CORONARY ANGIOPLASTY    . DILATION AND CURETTAGE OF UTERUS    . OOPHORECTOMY Bilateral   . PARTIAL MASTECTOMY WITH NEEDLE LOCALIZATION Left 01/01/2017   Procedure: PARTIAL MASTECTOMY WITH NEEDLE LOCALIZATION;  Surgeon: Herbert Pun, MD;  Location: [facility] ORS;  Service: General;  Laterality: Left;  . PORTACATH PLACEMENT Right 01/01/2017     Procedure: INSERTION PORT-A-CATH;  Surgeon: Herbert Pun, MD;  Location: [facility] ORS;  Service: General;  Laterality: Right;  . SENTINEL NODE BIOPSY Left 01/01/2017   Procedure: SENTINEL NODE BIOPSY;  Surgeon: Herbert Pun, MD;  Location: [facility] ORS;  Service: General;  Laterality: Left;  . TONSILLECTOMY    . VAGINAL HYSTERECTOMY      Prior to Admission medications   Medication Sig Start Date End Date Taking? Authorizing Provider  acetaminophen (TYLENOL) 325 MG tablet Take 650 mg by mouth every 6 (six) hours as needed (for headaches.).     [provider]  ALPRAZolam Duanne Moron) 0.25 MG tablet Take 0.25 mg by mouth at bedtime as needed for anxiety or sleep.  11/17/14   [provider]  anastrozole (ARIMIDEX) 1 MG tablet Take 1 tablet (1 mg total) by mouth daily. Patient not taking: Reported on 09/07/2017 05/04/17   Sindy Guadeloupe, MD  aspirin EC 325 MG tablet Take 325 mg by mouth daily with breakfast.     [provider]  atenolol (TENORMIN) 50 MG tablet Take 50 mg by mouth daily with breakfast.  07/24/14   [provider]  calcium citrate-vitamin D (CITRACAL+D) 315-200 MG-UNIT tablet Take 1 tablet by mouth 2 (two) times daily.    [provider]  dicyclomine (BENTYL) 20 MG tablet Take 1 tablet (20 mg total) by mouth 3 (three) times daily as needed for spasms. Patient not taking: Reported on 05/25/2017 01/23/17 01/23/18  Orbie Pyo, MD  FLUoxetine (PROZAC) 40 MG capsule Take 40 mg by mouth at bedtime. 12/18/16   [provider]  fluticasone (FLONASE) 50 MCG/ACT nasal spray Place 2 sprays into both nostrils daily as needed for allergies.     [provider]  hydrochlorothiazide (HYDRODIURIL) 25 MG tablet Take 25 mg by mouth daily.  07/24/14   [provider]  lansoprazole (PREVACID) 30 MG capsule Take 30 mg by mouth daily before breakfast.  07/24/14   [provider]  letrozole (FEMARA) 2.5 MG tablet  Take 1 tablet (2.5 mg total) by mouth daily. Patient not taking: Reported on 09/07/2017 08/28/17   Sindy Guadeloupe, MD  loratadine (CLARITIN) 10 MG tablet Take 10 mg by mouth daily as needed for allergies.  03/19/14   [provider]  Magnesium 250 MG TABS Take 250 mg by mouth daily.    [provider]  Multiple Vitamins-Minerals (PRESERVISION AREDS 2) CAPS Take 1 capsule by mouth 2 (two) times daily.     [provider]  neomycin-polymyxin-pramoxine (NEOSPORIN PLUS) 1 % cream Apply topically 2 (two) times daily. 11/17/17   Sable Feil, PA-C  oxyCODONE-acetaminophen (PERCOCET) 5-325 MG tablet Take 1-2 tablets by mouth every 8 (eight) hours as needed. Patient not taking: Reported on 07/06/2017 05/28/17   Earleen Newport, MD  polyethylene glycol [facility] / Floria Raveling) packet Take 17 g by mouth daily as needed.     [provider]  simethicone (  GAS-X) 80 MG chewable tablet Chew 1 tablet (80 mg total) every 6 (six) hours as needed by mouth for flatulence. Patient not taking: Reported on 09/07/2017 01/30/17   Sindy Guadeloupe, MD  simvastatin (ZOCOR) 20 MG tablet Take 20 mg by mouth daily at 8 pm. 11/04/16   [provider]  telmisartan (MICARDIS) 80 MG tablet Take 40 mg by mouth daily with breakfast. 11/23/16   [provider]  traMADol (ULTRAM) 50 MG tablet Take 1 tablet (50 mg total) by mouth every 12 (twelve) hours as needed. 11/17/17   Sable Feil, PA-C  triamcinolone ointment (KENALOG) 0.5 % Apply 1 application topically 2 (two) times daily. Patient not taking: Reported on 07/26/2017 02/20/17   Sindy Guadeloupe, MD  prochlorperazine (COMPAZINE) 10 MG tablet Take 1 tablet (10 mg total) by mouth every 6 (six) hours as needed (Nausea or vomiting). 01/04/17 04/06/17  Sindy Guadeloupe, MD    Allergies Imipramine pamoate; Bisphosphonates; Epinephrine; Prednisone; and Sulfa antibiotics  Family History  Problem Relation Age of Onset  . Diabetes Mother   .  Diabetes Father   . Colon cancer Sister   . Diabetes Sister   . Diabetes Maternal Aunt   . Colon cancer Paternal Grandfather   . Lung cancer Brother   . Breast cancer Neg Hx   . Ovarian cancer Neg Hx     Social History Social History   Tobacco Use  . Smoking status: Former Smoker    Packs/day: 1.00    Years: 33.00    Pack years: 33.00    Last attempt to quit: 10/17/1987    Years since quitting: 30.1  . Smokeless tobacco: Never Used  Substance Use Topics  . Alcohol use: No  . Drug use: No    Review of Systems Constitutional: No fever/chills Eyes: No visual changes. ENT: No sore throat. Cardiovascular: Denies chest pain. Respiratory: Denies shortness of breath. Gastrointestinal: No abdominal pain.  No nausea, no vomiting.  No diarrhea.  No constipation. Genitourinary: Negative for dysuria. Musculoskeletal: Negative for back pain. Skin: Negative for rash. Neurological: Negative for headaches, focal weakness or numbness.  Psychiatric:Anxiety. Endocrine:Hyperlipidemia and hypertension. Allergic/Immunilogical: See medication list.  ____________________________________________   PHYSICAL EXAM:  VITAL SIGNS: ED Triage Vitals  Enc Vitals Group     BP 11/17/17 1307 (!) 142/100     Pulse Rate 11/17/17 1307 68     Resp 11/17/17 1307 16     Temp 11/17/17 1307 98.5 F (36.9 C)     Temp Source 11/17/17 1307 Oral     SpO2 11/17/17 1307 96 %     Weight 11/17/17 1308 121 lb (54.9 kg)     Height 11/17/17 1308 5\' 2"  (1.575 m)     Head Circumference --      Peak Flow --      Pain Score 11/17/17 1302 7     Pain Loc --      Pain Edu? --      Excl. in [place]? --    Constitutional: Alert and oriented. Well appearing and in no acute distress. Hematological/Lymphatic/Immunilogical: No cervical lymphadenopathy. Cardiovascular: Normal rate, regular rhythm. Grossly normal heart sounds.  Good peripheral circulation. Respiratory: Normal respiratory effort.  No retractions. Lungs  CTAB. Neurologic:  Normal speech and language. No gross focal neurologic deficits are appreciated. No gait instability. Skin:  Skin is warm, dry and intact. No rash noted.  Skin avulsion distal right thumb. Psychiatric: Mood and affect are normal. Speech and behavior are  normal.  ____________________________________________   LABS (all labs ordered are listed, but only abnormal results are displayed)  Labs Reviewed - No data to display ____________________________________________  EKG   ____________________________________________  RADIOLOGY  ED MD interpretation:    Official radiology report(s): No results found.  ____________________________________________   PROCEDURES  Procedure(s) performed: None  Procedures  Critical Care performed: No  ____________________________________________   INITIAL IMPRESSION / ASSESSMENT AND PLAN / ED COURSE  As part of my medical decision making, I reviewed the following data within the electronic MEDICAL RECORD NUMBER    Skin avulsion to the left thumb.  Discussed rationale for not attempting to suture this area.  Hematuria controlled with LET, Surgicel, and pressure dressing.  Patient given Tdap prior to departure.  Patient given discharge care instructions and advised to follow-up PCP as needed.  Return to ED if increased bleeding.      ____________________________________________   FINAL CLINICAL IMPRESSION(S) / ED DIAGNOSES  Final diagnoses:  Avulsion of skin of right thumb, initial encounter     ED Discharge Orders         Ordered    traMADol (ULTRAM) 50 MG tablet  Every 12 hours PRN     11/17/17 1341    neomycin-polymyxin-pramoxine (NEOSPORIN PLUS) 1 % cream  2 times daily     11/17/17 1345           Note:  This document was prepared using Dragon voice recognition software and may include unintentional dictation errors.    Sable Feil, PA-C 11/17/17 [place], [place], MD 11/17/17 [phone]

## 2017-11-17 NOTE — ED Notes (Signed)
See triage note  presents with laceration to right thumb  States she was using a mandolin to cut potatoes .and it slipped

## 2017-11-17 NOTE — ED Triage Notes (Addendum)
Patient cut the tip of her lright thumb and reports bleeding continuing for longer than normal.  Patient takes aspirin daily.

## 2017-11-19 ENCOUNTER — Other Ambulatory Visit: Payer: Self-pay | Admitting: Oncology

## 2017-11-19 ENCOUNTER — Inpatient Hospital Stay: Payer: Medicare HMO

## 2017-11-19 DIAGNOSIS — Z170 Estrogen receptor positive status [ER+]: Principal | ICD-10-CM

## 2017-11-19 DIAGNOSIS — J45909 Unspecified asthma, uncomplicated: Secondary | ICD-10-CM | POA: Diagnosis not present

## 2017-11-19 DIAGNOSIS — F419 Anxiety disorder, unspecified: Secondary | ICD-10-CM | POA: Diagnosis not present

## 2017-11-19 DIAGNOSIS — I499 Cardiac arrhythmia, unspecified: Secondary | ICD-10-CM | POA: Diagnosis not present

## 2017-11-19 DIAGNOSIS — K449 Diaphragmatic hernia without obstruction or gangrene: Secondary | ICD-10-CM | POA: Diagnosis not present

## 2017-11-19 DIAGNOSIS — I341 Nonrheumatic mitral (valve) prolapse: Secondary | ICD-10-CM | POA: Diagnosis not present

## 2017-11-19 DIAGNOSIS — K219 Gastro-esophageal reflux disease without esophagitis: Secondary | ICD-10-CM | POA: Diagnosis not present

## 2017-11-19 DIAGNOSIS — Z5112 Encounter for antineoplastic immunotherapy: Secondary | ICD-10-CM | POA: Diagnosis not present

## 2017-11-19 DIAGNOSIS — Z171 Estrogen receptor negative status [ER-]: Secondary | ICD-10-CM | POA: Diagnosis not present

## 2017-11-19 DIAGNOSIS — C50312 Malignant neoplasm of lower-inner quadrant of left female breast: Secondary | ICD-10-CM

## 2017-11-19 MED ORDER — SODIUM CHLORIDE 0.9 % IV SOLN
Freq: Once | INTRAVENOUS | Status: AC
  Administered 2017-11-19: 10:00:00 via INTRAVENOUS
  Filled 2017-11-19: qty 250

## 2017-11-19 MED ORDER — DIPHENHYDRAMINE HCL 25 MG PO CAPS
25.0000 mg | ORAL_CAPSULE | Freq: Once | ORAL | Status: AC
  Administered 2017-11-19: 25 mg via ORAL
  Filled 2017-11-19: qty 1

## 2017-11-19 MED ORDER — TRASTUZUMAB CHEMO 150 MG IV SOLR
6.0000 mg/kg | Freq: Once | INTRAVENOUS | Status: AC
  Administered 2017-11-19: 336 mg via INTRAVENOUS
  Filled 2017-11-19: qty 16

## 2017-11-19 MED ORDER — HEPARIN SOD (PORK) LOCK FLUSH 100 UNIT/ML IV SOLN
500.0000 [IU] | Freq: Once | INTRAVENOUS | Status: AC
  Administered 2017-11-19: 500 [IU] via INTRAVENOUS
  Filled 2017-11-19: qty 5

## 2017-11-19 MED ORDER — SODIUM CHLORIDE 0.9% FLUSH
10.0000 mL | INTRAVENOUS | Status: DC | PRN
  Administered 2017-11-19: 10 mL via INTRAVENOUS
  Filled 2017-11-19: qty 10

## 2017-11-19 MED ORDER — ACETAMINOPHEN 325 MG PO TABS
650.0000 mg | ORAL_TABLET | Freq: Once | ORAL | Status: AC
  Administered 2017-11-19: 650 mg via ORAL
  Filled 2017-11-19: qty 2

## 2017-11-19 NOTE — Progress Notes (Signed)
Hematology/Oncology Consult note [facility]  Telephone:([phone] [phone]  Patient Care Team: Rusty Aus, MD as PCP - General (Internal Medicine)   Name of the patient: Doris Barnes  [id number]  Jun 09, 1940   Date of visit: 11/19/17  Diagnosis- Stage IA invasive mammary carcinoma of the left breast ER negative, PR weakly positive and HER-2 positive  Chief complaint/ Reason for visit-discuss echocardiogram results and further management  Heme/Onc history: 1. Patient is a 77 year old female who underwent bilateral screening mammogram on 11/28/2016 which showed a possible mass and calcifications in her left breast. This was followed by diagnostic mammogram and an ultrasound of the left breast which showed: 1.4 x 1.2 x 1.3 cm irregular mass in the left breast at the 6:30 position 3 cm from the nipple. On magnification views the overall extent of the mass and calcifications measures 1.9 cm. Left axilla was evaluated with ultrasound and showed no enlarged and morphologically abnormal lymph nodes.   2. She underwent core biopsy of the left breast mass which showed: Invasive mammary carcinoma, grade 3. No DCIS or lymphovascular invasion. ER was negative and PR was 11-50% positive and HER-2 was +3 on IHC  3Menarche at the age of 55 years. G5 P5 L5. She did have twoprior biopsiesof her left breast which did not reveal any malignancy. No family history of breast or ovarian cancer. Her sister and maternal grandfather had colon cancer. She is considerably anxious today at the thought of getting chemotherapy. She does have a history of mitral valve prolapse and states that she has "irregular heartbeat"for which she is on atenolol. She had a Holter monitor in the past but was not found to have any arrhythmia.Baseline MUGA scan showed a normal EF of 64.5%  4. Patient underwent lumpectomy on 01/01/2017 which showed invasive mammary carcinoma, size of tumor  was 14 mm, grade 3 with negative margins. 0 out of one lymph nodes was positive for malignancy. There was associated DCIS. Lymphovascular invasion was negative. ER negative, PR 11-50% positive and HER-2/neu +3  5. Adjuvant taxol herceptin chemo started on 01/16/17.Patient had significant fatigue abdominal bloating and Cramping as well as nosebleeds from Taxol and did not wish to continue with Taxol. She received 5 cycles of weekly Taxol. chemotherapy was held for 3 weeks and did not wish to continue with chemotherapy at that time.Trial of Abraxane was given for cycle #9 of treatment which she tolerated very well.  6. Given that she had PR positive disease- risks benefits of hormone therapy discussed. arimidex started on 05/04/17 along with ca/vit D. She could not tolerate both arimidex and aromasin due to athralgias and abdominal pain    Interval history-overall she is doing well.  Her mild leg edema has resolved.  She is currently anxious about her husband's aneurysm which was recently diagnosed and she needs to take him to [facility] for the same.  ECOG PS- 1 Pain scale- 0 Opioid associated constipation- no  Review of systems- Review of Systems  Constitutional: Negative for chills, fever, malaise/fatigue and weight loss.  HENT: Negative for congestion, ear discharge and nosebleeds.   Eyes: Negative for blurred vision.  Respiratory: Negative for cough, hemoptysis, sputum production, shortness of breath and wheezing.   Cardiovascular: Negative for chest pain, palpitations, orthopnea and claudication.  Gastrointestinal: Negative for abdominal pain, blood in stool, constipation, diarrhea, heartburn, melena, nausea and vomiting.  Genitourinary: Negative for dysuria, flank pain, frequency, hematuria and urgency.  Musculoskeletal: Negative for back pain,  joint pain and myalgias.  Skin: Negative for rash.  Neurological: Negative for dizziness, tingling, focal weakness, seizures, weakness and  headaches.  Endo/Heme/Allergies: Does not bruise/bleed easily.  Psychiatric/Behavioral: Negative for depression and suicidal ideas. The patient does not have insomnia.       Allergies  Allergen Reactions  . Imipramine Pamoate Shortness Of Breath  . Bisphosphonates Nausea Only  . Epinephrine Other (See Comments)    Difficulty breathing  . Prednisone Palpitations  . Sulfa Antibiotics Rash    Face turns red and stings     Past Medical History:  Diagnosis Date  . Anemia   . Anxiety   . Asthma   . Breast cancer ([place])   . Cancer ([place]) skin  . Chronic vulvitis   . Colon polyp   . Cystocele   . Diverticulitis   . Diverticulitis   . Diverticulosis   . Diverticulosis   . Dyspareunia, female   . Dyspnea   . Dysrhythmia   . Fibrocystic disease of both breasts   . Gastritis   . GERD (gastroesophageal reflux disease)    gastritis also  . Hemorrhoids   . History of hiatal hernia   . Hypercholesteremia   . Hypertension   . IBS (irritable bowel syndrome)   . IC (interstitial cystitis)   . Migraine   . Mitral valve prolapse   . OP (osteoporosis)   . Positional vertigo   . Rectocele   . Squamous cell carcinoma   . Stroke [facility])    TIA  . Vaginal atrophy      Past Surgical History:  Procedure Laterality Date  . APPENDECTOMY    . BREAST BIOPSY Left 1998   core bx- neg  . BREAST BIOPSY Left 2007   neg  . BREAST BIOPSY Left 12/14/2016   left breast US core path pending  . BREAST EXCISIONAL BIOPSY Left   . BREAST LUMPECTOMY Left 01/01/2017    INVASIVE MAMMARY CARCINOMA/Grade 3  . CARDIAC CATHETERIZATION N/A 01/18/2015   Procedure: Left Heart Cath and Coronary Angiography;  Surgeon: Teodoro Spray, MD;  Location: [place] [facility];  Service: Cardiovascular;  Laterality: N/A;  . CATARACT EXTRACTION W/PHACO Right 12/15/2015   Procedure: CATARACT EXTRACTION PHACO AND INTRAOCULAR LENS PLACEMENT (IOC);  Surgeon: Estill Cotta, MD;  Location: [facility] ORS;  Service:  Ophthalmology;  Laterality: Right;  [place] 01:20 AP% 23.9 CDE 35.49 Fluid pack lot # [id number]  . CATARACT EXTRACTION W/PHACO Left 12/29/2015   Procedure: CATARACT EXTRACTION PHACO AND INTRAOCULAR LENS PLACEMENT ([place]);  Surgeon: Estill Cotta, MD;  Location: [facility] OR[facility];  Service: Ophthalmology;  Laterality: Left;  [place]  01:20 AP% 25.1 CDE 32.86 Fluid pack lot # [id number]  . COLON SURGERY    . COLONOSCOPY WITH PROPOFOL N/A 02/21/2016   Procedure: COLONOSCOPY WITH PROPOFOL;  Surgeon: Manya Silvas, MD;  Location: [facility];  Service: Endoscopy;  Laterality: N/A;  . CORONARY ANGIOPLASTY    . DILATION AND CURETTAGE OF UTERUS    . OOPHORECTOMY Bilateral   . PARTIAL MASTECTOMY WITH NEEDLE LOCALIZATION Left 01/01/2017   Procedure: PARTIAL MASTECTOMY WITH NEEDLE LOCALIZATION;  Surgeon: Herbert Pun, MD;  Location: [facility] OR[facility];  Service: General;  Laterality: Left;  . PORTACATH PLACEMENT Right 01/01/2017   Procedure: INSERTION PORT-A-CATH;  Surgeon: Herbert Pun, MD;  Location: [facility] ORS;  Service: General;  Laterality: Right;  . SENTINEL NODE BIOPSY Left 01/01/2017   Procedure: SENTINEL NODE BIOPSY;  Surgeon: Herbert Pun, MD;  Location: [facility] ORS;  Service: General;  Laterality: Left;  . TONSILLECTOMY    . VAGINAL HYSTERECTOMY      Social History   Socioeconomic History  . Marital status: Married    Spouse name: Not on file  . Number of children: Not on file  . Years of education: Not on file  . Highest education level: Not on file  Occupational History  . Not on file  Social Needs  . Financial resource strain: Not on file  . Food insecurity:    Worry: Not on file    Inability: Not on file  . Transportation needs:    Medical: Not on file    Non-medical: Not on file  Tobacco Use  . Smoking status: Former Smoker    Packs/day: 1.00    Years: 33.00    Pack years: 33.00    Last attempt to quit: 10/17/1987    Years since quitting: 30.1  . Smokeless tobacco:  Never Used  Substance and Sexual Activity  . Alcohol use: No  . Drug use: No  . Sexual activity: Yes  Lifestyle  . Physical activity:    Days per week: Not on file    Minutes per session: Not on file  . Stress: Not on file  Relationships  . Social connections:    Talks on phone: Not on file    Gets together: Not on file    Attends religious service: Not on file    Active member of club or organization: Not on file    Attends meetings of clubs or organizations: Not on file    Relationship status: Not on file  . Intimate partner violence:    Fear of current or ex partner: Not on file    Emotionally abused: Not on file    Physically abused: Not on file    Forced sexual activity: Not on file  Other Topics Concern  . Not on file  Social History Narrative  . Not on file    Family History  Problem Relation Age of Onset  . Diabetes Mother   . Diabetes Father   . Colon cancer Sister   . Diabetes Sister   . Diabetes Maternal Aunt   . Colon cancer Paternal Grandfather   . Lung cancer Brother   . Breast cancer Neg Hx   . Ovarian cancer Neg Hx      Current Outpatient Medications:  .  aspirin EC 325 MG tablet, Take 325 mg by mouth daily with breakfast. , Disp: , Rfl:  .  atenolol (TENORMIN) 50 MG tablet, Take 50 mg by mouth daily with breakfast. , Disp: , Rfl:  .  calcium citrate-vitamin D (CITRACAL+D) 315-200 MG-UNIT tablet, Take 1 tablet by mouth 2 (two) times daily., Disp: , Rfl:  .  FLUoxetine (PROZAC) 40 MG capsule, Take 40 mg by mouth at bedtime., Disp: , Rfl:  .  hydrochlorothiazide (HYDRODIURIL) 25 MG tablet, Take 25 mg by mouth daily. , Disp: , Rfl:  .  lansoprazole (PREVACID) 30 MG capsule, Take 30 mg by mouth daily before breakfast. , Disp: , Rfl:  .  loratadine (CLARITIN) 10 MG tablet, Take 10 mg by mouth daily as needed for allergies. , Disp: , Rfl:  .  Magnesium 250 MG TABS, Take 250 mg by mouth daily., Disp: , Rfl:  .  Multiple Vitamins-Minerals (PRESERVISION  AREDS 2) CAPS, Take 1 capsule by mouth 2 (two) times daily. , Disp: , Rfl:  .  simvastatin (ZOCOR) 20 MG tablet, Take 20 mg by mouth daily  at 8 pm., Disp: , Rfl:  .  telmisartan (MICARDIS) 80 MG tablet, Take 40 mg by mouth daily with breakfast., Disp: , Rfl:  .  acetaminophen (TYLENOL) 325 MG tablet, Take 650 mg by mouth every 6 (six) hours as needed (for headaches.). , Disp: , Rfl:  .  ALPRAZolam (XANAX) 0.25 MG tablet, Take 0.25 mg by mouth at bedtime as needed for anxiety or sleep. , Disp: , Rfl:  .  anastrozole (ARIMIDEX) 1 MG tablet, Take 1 tablet (1 mg total) by mouth daily. (Patient not taking: Reported on 09/07/2017), Disp: 30 tablet, Rfl: 3 .  dicyclomine (BENTYL) 20 MG tablet, Take 1 tablet (20 mg total) by mouth 3 (three) times daily as needed for spasms. (Patient not taking: Reported on 05/25/2017), Disp: 30 tablet, Rfl: 0 .  fluticasone (FLONASE) 50 MCG/ACT nasal spray, Place 2 sprays into both nostrils daily as needed for allergies. , Disp: , Rfl:  .  letrozole (FEMARA) 2.5 MG tablet, Take 1 tablet (2.5 mg total) by mouth daily. (Patient not taking: Reported on 09/07/2017), Disp: 30 tablet, Rfl: 0 .  neomycin-polymyxin-pramoxine (NEOSPORIN PLUS) 1 % cream, Apply topically 2 (two) times daily., Disp: 14.2 g, Rfl: 0 .  oxyCODONE-acetaminophen (PERCOCET) 5-325 MG tablet, Take 1-2 tablets by mouth every 8 (eight) hours as needed. (Patient not taking: Reported on 07/06/2017), Disp: 20 tablet, Rfl: 0 .  polyethylene glycol (MIRALAX / GLYCOLAX) packet, Take 17 g by mouth daily as needed. , Disp: , Rfl:  .  simethicone (GAS-X) 80 MG chewable tablet, Chew 1 tablet (80 mg total) every 6 (six) hours as needed by mouth for flatulence. (Patient not taking: Reported on 09/07/2017), Disp: 30 tablet, Rfl: 0 .  traMADol (ULTRAM) 50 MG tablet, Take 1 tablet (50 mg total) by mouth every 12 (twelve) hours as needed., Disp: 12 tablet, Rfl: 0 .  triamcinolone ointment (KENALOG) 0.5 %, Apply 1 application topically 2  (two) times daily. (Patient not taking: Reported on 07/26/2017), Disp: 30 g, Rfl: 0 No current facility-administered medications for this visit.   Facility-Administered Medications Ordered in Other Visits:  .  sodium chloride flush (NS) 0.9 % injection 10 mL, 10 mL, Intravenous, PRN, Sindy Guadeloupe, MD, 10 mL at 11/19/17 0950 .  trastuzumab (HERCEPTIN) 336 mg in sodium chloride 0.9 % 250 mL chemo infusion, 6 mg/kg (Treatment Plan Recorded), Intravenous, Once, Sindy Guadeloupe, MD, Stopped at 11/19/17 1145  Physical exam:  Vitals:   11/16/17 1418  BP: 125/72  Pulse: 65  Resp: 18  Temp: (!) 97.5 F (36.4 C)  TempSrc: Tympanic  SpO2: 97%  Weight: 122 lb 8 oz (55.6 kg)  Height: '5\' 2"'  (1.575 m)   Physical Exam  Constitutional: She is oriented to person, place, and time.  Thin elderly frail woman in no acute distress  HENT:  Head: Normocephalic and atraumatic.  Eyes: Pupils are equal, round, and reactive to light. EOM are normal.  Neck: Normal range of motion.  Cardiovascular: Normal rate, regular rhythm and normal heart sounds.  Pulmonary/Chest: Effort normal and breath sounds normal.  Abdominal: Soft. Bowel sounds are normal.  Musculoskeletal: She exhibits no edema.  Neurological: She is alert and oriented to person, place, and time.  Skin: Skin is warm and dry.     CMP Latest Ref Rng & Units 10/19/2017  Glucose 70 - 99 mg/dL 116(H)  BUN 8 - 23 mg/dL 14  Creatinine 0.44 - 1.00 mg/dL 0.66  Sodium 135 - 145 mmol/L 133(L)  Potassium  3.5 - 5.1 mmol/L 3.7  Chloride 98 - 111 mmol/L 98  CO2 22 - 32 mmol/L 27  Calcium 8.9 - 10.3 mg/dL 9.0  Total Protein 6.5 - 8.1 g/dL 6.6  Total Bilirubin 0.3 - 1.2 mg/dL 0.6  Alkaline Phos 38 - 126 U/L 68  AST 15 - 41 U/L 24  ALT 0 - 44 U/L 19   CBC Latest Ref Rng & Units 10/19/2017  WBC 3.6 - 11.0 K/uL 5.5  Hemoglobin 12.0 - 16.0 g/dL 10.9(L)  Hematocrit 35.0 - 47.0 % 32.8(L)  Platelets 150 - 440 K/uL 217      Assessment and plan- Patient is  a 77 y.o. female pathologicalprognostic stage IA pT1c1p N0 cM0invasive mammary carcinoma of the left breast ER negative PR positive and HER-2 +3 on IHC s/p lumpectomy s/p adjuvant chemotherapy.  She is here to discuss her echocardiogram results and whether she can continue her Herceptin at this time  Patient had an echocardiogram in January 2019 which showed an EF of 65%.  That gradually dropped down to 45 to 50% in July 2019 and her Herceptin has been on hold since then.  We did repeat an echocardiogram 4 weeks after that and her EF has improved to 55 to 65%.  Patient started her Herceptin treatment on 01/16/2017.  She has 2 more months of treatment remaining and she will proceed with the next cycle of Herceptin on 11/19/2017 given that her EF has now improved.  She will proceed with the next cycle of Herceptin following that in 3 weeks time and I will see her back in 6 weeks with CBC and CMP   Visit Diagnosis 1. Malignant neoplasm of lower-inner quadrant of left breast in female, estrogen receptor positive ([place])   2. Encounter for monoclonal antibody treatment for malignancy      Dr. Randa Evens, MD, MPH [facility] [phone] 11/19/2017 11:20 AM

## 2017-11-22 ENCOUNTER — Encounter: Payer: Self-pay | Admitting: *Deleted

## 2017-11-23 ENCOUNTER — Encounter: Payer: Self-pay | Admitting: Anesthesiology

## 2017-11-23 ENCOUNTER — Encounter
Admission: RE | Disposition: A | Discharge status: Home / Self Care | Payer: Self-pay | Source: Ambulatory Visit | Attending: Unknown Physician Specialty

## 2017-11-23 ENCOUNTER — Ambulatory Visit
Admission: RE | Admit: 2017-11-23 | Discharge: 2017-11-23 | Disposition: A | Discharge status: Home / Self Care | Payer: Medicare HMO | Source: Ambulatory Visit | Attending: Unknown Physician Specialty | Admitting: Unknown Physician Specialty

## 2017-11-23 ENCOUNTER — Ambulatory Visit: Payer: Medicare HMO | Admitting: Anesthesiology

## 2017-11-23 DIAGNOSIS — F419 Anxiety disorder, unspecified: Secondary | ICD-10-CM | POA: Diagnosis not present

## 2017-11-23 DIAGNOSIS — K253 Acute gastric ulcer without hemorrhage or perforation: Secondary | ICD-10-CM | POA: Insufficient documentation

## 2017-11-23 DIAGNOSIS — K449 Diaphragmatic hernia without obstruction or gangrene: Secondary | ICD-10-CM | POA: Diagnosis not present

## 2017-11-23 DIAGNOSIS — Z882 Allergy status to sulfonamides status: Secondary | ICD-10-CM | POA: Insufficient documentation

## 2017-11-23 DIAGNOSIS — Z79811 Long term (current) use of aromatase inhibitors: Secondary | ICD-10-CM | POA: Insufficient documentation

## 2017-11-23 DIAGNOSIS — K319 Disease of stomach and duodenum, unspecified: Secondary | ICD-10-CM | POA: Insufficient documentation

## 2017-11-23 DIAGNOSIS — J45909 Unspecified asthma, uncomplicated: Secondary | ICD-10-CM | POA: Diagnosis not present

## 2017-11-23 DIAGNOSIS — Z888 Allergy status to other drugs, medicaments and biological substances status: Secondary | ICD-10-CM | POA: Diagnosis not present

## 2017-11-23 DIAGNOSIS — K2900 Acute gastritis without bleeding: Secondary | ICD-10-CM | POA: Diagnosis not present

## 2017-11-23 DIAGNOSIS — Z9012 Acquired absence of left breast and nipple: Secondary | ICD-10-CM | POA: Diagnosis not present

## 2017-11-23 DIAGNOSIS — Z800 Family history of malignant neoplasm of digestive organs: Secondary | ICD-10-CM | POA: Insufficient documentation

## 2017-11-23 DIAGNOSIS — I10 Essential (primary) hypertension: Secondary | ICD-10-CM | POA: Insufficient documentation

## 2017-11-23 DIAGNOSIS — K2950 Unspecified chronic gastritis without bleeding: Secondary | ICD-10-CM | POA: Diagnosis not present

## 2017-11-23 DIAGNOSIS — Z8673 Personal history of transient ischemic attack (TIA), and cerebral infarction without residual deficits: Secondary | ICD-10-CM | POA: Insufficient documentation

## 2017-11-23 DIAGNOSIS — Z87891 Personal history of nicotine dependence: Secondary | ICD-10-CM | POA: Insufficient documentation

## 2017-11-23 DIAGNOSIS — M810 Age-related osteoporosis without current pathological fracture: Secondary | ICD-10-CM | POA: Insufficient documentation

## 2017-11-23 DIAGNOSIS — K219 Gastro-esophageal reflux disease without esophagitis: Secondary | ICD-10-CM | POA: Insufficient documentation

## 2017-11-23 DIAGNOSIS — E7800 Pure hypercholesterolemia, unspecified: Secondary | ICD-10-CM | POA: Insufficient documentation

## 2017-11-23 DIAGNOSIS — Z7982 Long term (current) use of aspirin: Secondary | ICD-10-CM | POA: Diagnosis not present

## 2017-11-23 DIAGNOSIS — Z9861 Coronary angioplasty status: Secondary | ICD-10-CM | POA: Insufficient documentation

## 2017-11-23 DIAGNOSIS — Z79891 Long term (current) use of opiate analgesic: Secondary | ICD-10-CM | POA: Insufficient documentation

## 2017-11-23 DIAGNOSIS — N3010 Interstitial cystitis (chronic) without hematuria: Secondary | ICD-10-CM | POA: Insufficient documentation

## 2017-11-23 DIAGNOSIS — Z79899 Other long term (current) drug therapy: Secondary | ICD-10-CM | POA: Diagnosis not present

## 2017-11-23 DIAGNOSIS — Z9842 Cataract extraction status, left eye: Secondary | ICD-10-CM | POA: Insufficient documentation

## 2017-11-23 DIAGNOSIS — K3189 Other diseases of stomach and duodenum: Secondary | ICD-10-CM | POA: Diagnosis not present

## 2017-11-23 DIAGNOSIS — Z86010 Personal history of colonic polyps: Secondary | ICD-10-CM | POA: Insufficient documentation

## 2017-11-23 DIAGNOSIS — Z9841 Cataract extraction status, right eye: Secondary | ICD-10-CM | POA: Insufficient documentation

## 2017-11-23 DIAGNOSIS — K589 Irritable bowel syndrome without diarrhea: Secondary | ICD-10-CM | POA: Diagnosis not present

## 2017-11-23 DIAGNOSIS — Z7951 Long term (current) use of inhaled steroids: Secondary | ICD-10-CM | POA: Insufficient documentation

## 2017-11-23 DIAGNOSIS — E785 Hyperlipidemia, unspecified: Secondary | ICD-10-CM | POA: Diagnosis not present

## 2017-11-23 DIAGNOSIS — K2970 Gastritis, unspecified, without bleeding: Secondary | ICD-10-CM | POA: Diagnosis not present

## 2017-11-23 DIAGNOSIS — Z961 Presence of intraocular lens: Secondary | ICD-10-CM | POA: Insufficient documentation

## 2017-11-23 DIAGNOSIS — Z853 Personal history of malignant neoplasm of breast: Secondary | ICD-10-CM | POA: Insufficient documentation

## 2017-11-23 HISTORY — PX: ESOPHAGOGASTRODUODENOSCOPY (EGD) WITH PROPOFOL: SHX5813

## 2017-11-23 SURGERY — ESOPHAGOGASTRODUODENOSCOPY (EGD) WITH PROPOFOL
Anesthesia: General

## 2017-11-23 MED ORDER — PIPERACILLIN-TAZOBACTAM 3.375 G IVPB 30 MIN
3.3750 g | Freq: Once | INTRAVENOUS | Status: AC
  Administered 2017-11-23: 3.375 g via INTRAVENOUS
  Filled 2017-11-23: qty 50

## 2017-11-23 MED ORDER — PROPOFOL 10 MG/ML IV BOLUS
INTRAVENOUS | Status: DC | PRN
  Administered 2017-11-23: 90 mg via INTRAVENOUS

## 2017-11-23 MED ORDER — SODIUM CHLORIDE 0.9 % IV SOLN: INTRAVENOUS | Status: DC

## 2017-11-23 MED ORDER — PIPERACILLIN-TAZOBACTAM 3.375 G IVPB
INTRAVENOUS | Status: AC
  Administered 2017-11-23: 3.375 g via INTRAVENOUS
  Filled 2017-11-23: qty 50

## 2017-11-23 MED ORDER — PROPOFOL 10 MG/ML IV BOLUS
INTRAVENOUS | Status: AC
  Filled 2017-11-23: qty 20

## 2017-11-23 MED ORDER — LIDOCAINE HCL (PF) 2 % IJ SOLN
INTRAMUSCULAR | Status: AC
  Filled 2017-11-23: qty 10

## 2017-11-23 MED ORDER — LIDOCAINE HCL (CARDIAC) PF 100 MG/5ML IV SOSY
PREFILLED_SYRINGE | INTRAVENOUS | Status: DC | PRN
  Administered 2017-11-23: 50 mg via INTRAVENOUS

## 2017-11-23 MED ORDER — SODIUM CHLORIDE 0.9 % IV SOLN
INTRAVENOUS | Status: DC
  Administered 2017-11-23: 1000 mL via INTRAVENOUS

## 2017-11-23 MED ORDER — PROPOFOL 500 MG/50ML IV EMUL
INTRAVENOUS | Status: DC | PRN
  Administered 2017-11-23: 130 ug/kg/min via INTRAVENOUS

## 2017-11-23 MED ORDER — LIDOCAINE HCL (PF) 1 % IJ SOLN
INTRAMUSCULAR | Status: AC
  Administered 2017-11-23: 0.3 mL
  Filled 2017-11-23: qty 2

## 2017-11-23 NOTE — Op Note (Signed)
[facility] Gastroenterology Patient Name: Doris Barnes Procedure Date: 11/23/2017 11:45 AM MRN: [id number] Account #: [id number] Date of Birth: May 25, 1940 Admit Type: Outpatient Age: 77 Room: [facility] ENDO ROOM 3 Gender: Female Note Status: Finalized Procedure:            Upper GI endoscopy Indications:          For therapy of gastro-esophageal reflux disease Providers:            Manya Silvas, MD Referring MD:         Rusty Aus, MD (Referring MD) Medicines:            Propofol per Anesthesia Complications:        No immediate complications. Procedure:            Pre-Anesthesia Assessment:                       - After reviewing the risks and benefits, the patient                        was deemed in satisfactory condition to undergo the                        procedure.                       After obtaining informed consent, the endoscope was                        passed under direct vision. Throughout the procedure,                        the patient's blood pressure, pulse, and oxygen                        saturations were monitored continuously. The Endoscope                        was introduced through the mouth, and advanced to the                        second part of duodenum. The upper GI endoscopy was                        accomplished without difficulty. The patient tolerated                        the procedure well. Findings:      A small hiatal hernia was present.      Scattered and patchy moderate inflammation characterized by erythema,       granularity and shallow ulcerations was found in the gastric body and in       the gastric antrum. Biopsies were taken with a cold forceps for       histology. Biopsies were taken with a cold forceps for Helicobacter       pylori testing. Broad areas of inflammation seen in the body of the       stomach. Worst inflammation seen in anterior mid body. Impression:           - Small hiatal hernia.                     -  Acute chronic erosive gastritis. Biopsied.                       take extra medicine to heal up the inflammation, await                        possible Helicobacter determination. Recommendation:       - Await pathology results. Manya Silvas, MD 11/23/2017 12:12:55 PM This report has been signed electronically. Number of Addenda: 0 Note Initiated On: 11/23/2017 11:45 AM      [facility]

## 2017-11-23 NOTE — Anesthesia Preprocedure Evaluation (Signed)
Anesthesia Evaluation  Patient identified by MRN, date of birth, ID band Patient awake    Reviewed: Allergy & Precautions, NPO status , Patient's Chart, lab work & pertinent test results, reviewed documented beta blocker date and time   Airway Mallampati: II  TM Distance: >3 FB     Dental  (+) Chipped   Pulmonary shortness of breath, asthma , former smoker,           Cardiovascular hypertension, Pt. on medications and Pt. on home beta blockers + dysrhythmias      Neuro/Psych  Headaches, Anxiety TIA   GI/Hepatic hiatal hernia, GERD  ,  Endo/Other    Renal/GU      Musculoskeletal  (+) Arthritis ,   Abdominal   Peds  Hematology  (+) anemia ,   Anesthesia Other Findings MVP. Echo 55-65.  Reproductive/Obstetrics                             Anesthesia Physical Anesthesia Plan  ASA: III  Anesthesia Plan: General   Post-op Pain Management:    Induction: Intravenous  PONV Risk Score and Plan:   Airway Management Planned:   Additional Equipment:   Intra-op Plan:   Post-operative Plan:   Informed Consent: I have reviewed the patients History and Physical, chart, labs and discussed the procedure including the risks, benefits and alternatives for the proposed anesthesia with the patient or authorized representative who has indicated his/her understanding and acceptance.     Plan Discussed with: CRNA  Anesthesia Plan Comments:         Anesthesia Quick Evaluation

## 2017-11-23 NOTE — H&P (Signed)
Primary Care Physician:  Rusty Aus, MD Primary Gastroenterologist:  Dr. Vira Agar  Pre-Procedure History & Physical: HPI:  Doris Barnes is a 77 y.o. female is here for an endoscopy. Done for GERD   Past Medical History:  Diagnosis Date  . Anemia   . Anxiety   . Asthma   . Breast cancer ([place])   . Cancer ([place]) skin  . Chronic vulvitis   . Colon polyp   . Cystocele   . Diverticulitis   . Diverticulitis   . Diverticulosis   . Diverticulosis   . Dyspareunia, female   . Dyspnea   . Dysrhythmia   . Fibrocystic disease of both breasts   . Gastritis   . GERD (gastroesophageal reflux disease)    gastritis also  . Hemorrhoids   . History of hiatal hernia   . Hypercholesteremia   . Hypertension   . IBS (irritable bowel syndrome)   . IC (interstitial cystitis)   . Migraine   . Mitral valve prolapse   . OP (osteoporosis)   . Positional vertigo   . Rectocele   . Squamous cell carcinoma   . Stroke [facility])    TIA  . Vaginal atrophy     Past Surgical History:  Procedure Laterality Date  . APPENDECTOMY    . BREAST BIOPSY Left 1998   core bx- neg  . BREAST BIOPSY Left 2007   neg  . BREAST BIOPSY Left 12/14/2016   left breast US core path pending  . BREAST EXCISIONAL BIOPSY Left   . BREAST LUMPECTOMY Left 01/01/2017    INVASIVE MAMMARY CARCINOMA/Grade 3  . CARDIAC CATHETERIZATION N/A 01/18/2015   Procedure: Left Heart Cath and Coronary Angiography;  Surgeon: Teodoro Spray, MD;  Location: [place] [facility];  Service: Cardiovascular;  Laterality: N/A;  . CATARACT EXTRACTION W/PHACO Right 12/15/2015   Procedure: CATARACT EXTRACTION PHACO AND INTRAOCULAR LENS PLACEMENT (IOC);  Surgeon: Estill Cotta, MD;  Location: [facility] ORS;  Service: Ophthalmology;  Laterality: Right;  [place] 01:20 AP% 23.9 CDE 35.49 Fluid pack lot # [id number]  . CATARACT EXTRACTION W/PHACO Left 12/29/2015   Procedure: CATARACT EXTRACTION PHACO AND INTRAOCULAR LENS PLACEMENT ([place]);  Surgeon: Estill Cotta, MD;  Location: [facility] ORS;  Service: Ophthalmology;  Laterality: Left;  [place]  01:20 AP% 25.1 CDE 32.86 Fluid pack lot # [id number]  . COLON SURGERY    . COLONOSCOPY WITH PROPOFOL N/A 02/21/2016   Procedure: COLONOSCOPY WITH PROPOFOL;  Surgeon: Manya Silvas, MD;  Location: [facility] ENDOSCOPY;  Service: Endoscopy;  Laterality: N/A;  . CORONARY ANGIOPLASTY    . DILATION AND CURETTAGE OF UTERUS    . OOPHORECTOMY Bilateral   . PARTIAL MASTECTOMY WITH NEEDLE LOCALIZATION Left 01/01/2017   Procedure: PARTIAL MASTECTOMY WITH NEEDLE LOCALIZATION;  Surgeon: Herbert Pun, MD;  Location: [facility] ORS;  Service: General;  Laterality: Left;  . PORTACATH PLACEMENT Right 01/01/2017   Procedure: INSERTION PORT-A-CATH;  Surgeon: Herbert Pun, MD;  Location: [facility] ORS;  Service: General;  Laterality: Right;  . SENTINEL NODE BIOPSY Left 01/01/2017   Procedure: SENTINEL NODE BIOPSY;  Surgeon: Herbert Pun, MD;  Location: [facility] ORS;  Service: General;  Laterality: Left;  . TONSILLECTOMY    . VAGINAL HYSTERECTOMY      Prior to Admission medications   Medication Sig Start Date End Date Taking? Authorizing Provider  acetaminophen (TYLENOL) 325 MG tablet Take 650 mg by mouth every 6 (six) hours as needed (for headaches.).    Yes [provider]  ALPRAZolam (XANAX) 0.25 MG tablet Take 0.25 mg by mouth at bedtime as needed for anxiety or sleep.  11/17/14  Yes [provider]  anastrozole (ARIMIDEX) 1 MG tablet Take 1 tablet (1 mg total) by mouth daily. 05/04/17  Yes Sindy Guadeloupe, MD  aspirin EC 325 MG tablet Take 325 mg by mouth daily with breakfast.    Yes [provider]  atenolol (TENORMIN) 50 MG tablet Take 50 mg by mouth daily with breakfast.  07/24/14  Yes [provider]  calcium citrate-vitamin D (CITRACAL+D) 315-200 MG-UNIT tablet Take 1 tablet by mouth 2 (two) times daily.   Yes [provider]  dicyclomine (BENTYL) 20 MG tablet Take 1  tablet (20 mg total) by mouth 3 (three) times daily as needed for spasms. 01/23/17 01/23/18 Yes Orbie Pyo, MD  FLUoxetine (PROZAC) 40 MG capsule Take 40 mg by mouth at bedtime. 12/18/16  Yes [provider]  fluticasone (FLONASE) 50 MCG/ACT nasal spray Place 2 sprays into both nostrils daily as needed for allergies.    Yes [provider]  hydrochlorothiazide (HYDRODIURIL) 25 MG tablet Take 25 mg by mouth daily.  07/24/14  Yes [provider]  lansoprazole (PREVACID) 30 MG capsule Take 30 mg by mouth daily before breakfast.  07/24/14  Yes [provider]  letrozole (FEMARA) 2.5 MG tablet Take 1 tablet (2.5 mg total) by mouth daily. 08/28/17  Yes Sindy Guadeloupe, MD  loratadine (CLARITIN) 10 MG tablet Take 10 mg by mouth daily as needed for allergies.  03/19/14  Yes [provider]  Magnesium 250 MG TABS Take 250 mg by mouth daily.   Yes [provider]  Multiple Vitamins-Minerals (PRESERVISION AREDS 2) CAPS Take 1 capsule by mouth 2 (two) times daily.    Yes [provider]  neomycin-polymyxin-pramoxine (NEOSPORIN PLUS) 1 % cream Apply topically 2 (two) times daily. 11/17/17  Yes Sable Feil, PA-C  oxyCODONE-acetaminophen (PERCOCET) 5-325 MG tablet Take 1-2 tablets by mouth every 8 (eight) hours as needed. 05/28/17  Yes Earleen Newport, MD  simethicone (GAS-X) 80 MG chewable tablet Chew 1 tablet (80 mg total) every 6 (six) hours as needed by mouth for flatulence. 01/30/17  Yes Sindy Guadeloupe, MD  telmisartan (MICARDIS) 80 MG tablet Take 40 mg by mouth daily with breakfast. 11/23/16  Yes [provider]  traMADol (ULTRAM) 50 MG tablet Take 1 tablet (50 mg total) by mouth every 12 (twelve) hours as needed. 11/17/17  Yes Sable Feil, PA-C  triamcinolone ointment (KENALOG) 0.5 % Apply 1 application topically 2 (two) times daily. 02/20/17  Yes Sindy Guadeloupe, MD  polyethylene glycol (MIRALAX / GLYCOLAX) packet Take 17 g  by mouth daily as needed.     [provider]  simvastatin (ZOCOR) 20 MG tablet Take 20 mg by mouth daily at 8 pm. 11/04/16   [provider]  prochlorperazine (COMPAZINE) 10 MG tablet Take 1 tablet (10 mg total) by mouth every 6 (six) hours as needed (Nausea or vomiting). 01/04/17 04/06/17  Sindy Guadeloupe, MD    Allergies as of 11/02/2017 - Review Complete 10/19/2017  Allergen Reaction Noted  . Imipramine pamoate Shortness Of Breath 12/13/2015  . Bisphosphonates Nausea Only 11/28/2016  . Epinephrine Other (See Comments) 12/13/2015  . Prednisone Palpitations 12/21/2016  . Sulfa antibiotics Rash 09/17/2014    Family History  Problem Relation Age of Onset  . Diabetes Mother   . Diabetes Father   . Colon cancer Sister   .  Diabetes Sister   . Diabetes Maternal Aunt   . Colon cancer Paternal Grandfather   . Lung cancer Brother   . Breast cancer Neg Hx   . Ovarian cancer Neg Hx     Social History   Socioeconomic History  . Marital status: Married    Spouse name: Not on file  . Number of children: Not on file  . Years of education: Not on file  . Highest education level: Not on file  Occupational History  . Not on file  Social Needs  . Financial resource strain: Not on file  . Food insecurity:    Worry: Not on file    Inability: Not on file  . Transportation needs:    Medical: Not on file    Non-medical: Not on file  Tobacco Use  . Smoking status: Former Smoker    Packs/day: 1.00    Years: 33.00    Pack years: 33.00    Last attempt to quit: 10/17/1987    Years since quitting: 30.1  . Smokeless tobacco: Never Used  Substance and Sexual Activity  . Alcohol use: No  . Drug use: No  . Sexual activity: Yes  Lifestyle  . Physical activity:    Days per week: Not on file    Minutes per session: Not on file  . Stress: Not on file  Relationships  . Social connections:    Talks on phone: Not on file    Gets together: Not on file    Attends religious  service: Not on file    Active member of club or organization: Not on file    Attends meetings of clubs or organizations: Not on file    Relationship status: Not on file  . Intimate partner violence:    Fear of current or ex partner: Not on file    Emotionally abused: Not on file    Physically abused: Not on file    Forced sexual activity: Not on file  Other Topics Concern  . Not on file  Social History Narrative  . Not on file    Review of Systems: See HPI, otherwise negative ROS  Physical Exam: BP (!) 160/76   Pulse 65   Temp (!) 96.1 F (35.6 C) (Tympanic)   Resp 16   Ht 5\' 2"  (1.575 m)   Wt 54.4 kg   SpO2 100%   BMI 21.95 kg/m  General:   Alert,  pleasant and cooperative in NAD Head:  Normocephalic and atraumatic. Neck:  Supple; no masses or thyromegaly. Lungs:  Clear throughout to auscultation.    Heart:  Regular rate and rhythm. Abdomen:  Soft, nontender and nondistended. Normal bowel sounds, without guarding, and without rebound.   Neurologic:  Alert and  oriented x4;  grossly normal neurologically.  Impression/Plan: SYNAI PRETTYMAN is here for an endoscopy to be performed for GERD  Risks, benefits, limitations, and alternatives regarding  endoscopy have been reviewed with the patient.  Questions have been answered.  All parties agreeable.   Gaylyn Cheers, MD  11/23/2017, 11:53 AM

## 2017-11-23 NOTE — Anesthesia Post-op Follow-up Note (Signed)
Anesthesia QCDR form completed.        

## 2017-11-23 NOTE — Anesthesia Postprocedure Evaluation (Signed)
Anesthesia Post Note  Patient: Doris Barnes  Procedure(s) Performed: ESOPHAGOGASTRODUODENOSCOPY (EGD) WITH PROPOFOL (N/A )  Patient location during evaluation: Endoscopy Anesthesia Type: General Level of consciousness: awake and alert Pain management: pain level controlled Vital Signs Assessment: post-procedure vital signs reviewed and stable Respiratory status: spontaneous breathing, nonlabored ventilation, respiratory function stable and patient connected to nasal cannula oxygen Cardiovascular status: blood pressure returned to baseline and stable Postop Assessment: no apparent nausea or vomiting Anesthetic complications: no     Last Vitals:  Vitals:   11/23/17 1058 11/23/17 1210  BP: (!) 160/76 (!) 122/44  Pulse: 65   Resp: 16 18  Temp: (!) 35.6 C (!) 36.1 C  SpO2: 100%     Last Pain:  Vitals:   11/23/17 1230  TempSrc:   PainSc: 0-No pain                 Harmony Sandell S

## 2017-11-23 NOTE — Transfer of Care (Signed)
Immediate Anesthesia Transfer of Care Note  Patient: Doris Barnes  Procedure(s) Performed: ESOPHAGOGASTRODUODENOSCOPY (EGD) WITH PROPOFOL (N/A )  Patient Location: PACU and Endoscopy Unit  Anesthesia Type:General  Level of Consciousness: awake, alert , oriented and patient cooperative  Airway & Oxygen Therapy: Patient Spontanous Breathing  Post-op Assessment: Report given to RN and Post -op Vital signs reviewed and stable  Post vital signs: Reviewed and stable  Last Vitals:  Vitals Value Taken Time  BP    Temp    Pulse 63 11/23/2017 12:10 PM  Resp 24 11/23/2017 12:10 PM  SpO2 98 % 11/23/2017 12:10 PM  Vitals shown include unvalidated device data.  Last Pain:  Vitals:   11/23/17 1210  TempSrc: (P) Tympanic  PainSc:          Complications: No apparent anesthesia complications

## 2017-11-27 ENCOUNTER — Encounter: Payer: Self-pay | Admitting: Unknown Physician Specialty

## 2017-11-28 LAB — SURGICAL PATHOLOGY

## 2017-12-05 DIAGNOSIS — M810 Age-related osteoporosis without current pathological fracture: Secondary | ICD-10-CM | POA: Diagnosis not present

## 2017-12-05 DIAGNOSIS — I25119 Atherosclerotic heart disease of native coronary artery with unspecified angina pectoris: Secondary | ICD-10-CM | POA: Diagnosis not present

## 2017-12-05 DIAGNOSIS — E782 Mixed hyperlipidemia: Secondary | ICD-10-CM | POA: Diagnosis not present

## 2017-12-10 ENCOUNTER — Other Ambulatory Visit: Payer: Self-pay

## 2017-12-10 DIAGNOSIS — IMO0002 Reserved for concepts with insufficient information to code with codable children: Secondary | ICD-10-CM

## 2017-12-11 ENCOUNTER — Encounter: Payer: Self-pay | Admitting: Oncology

## 2017-12-11 ENCOUNTER — Inpatient Hospital Stay: Payer: Medicare HMO | Attending: Oncology

## 2017-12-11 ENCOUNTER — Inpatient Hospital Stay (HOSPITAL_BASED_OUTPATIENT_CLINIC_OR_DEPARTMENT_OTHER): Payer: Medicare HMO | Admitting: Oncology

## 2017-12-11 ENCOUNTER — Inpatient Hospital Stay: Payer: Medicare HMO

## 2017-12-11 VITALS — BP 148/70 | HR 64 | Temp 97.7°F | Resp 18 | Ht 62.0 in | Wt 123.8 lb

## 2017-12-11 DIAGNOSIS — E7800 Pure hypercholesterolemia, unspecified: Secondary | ICD-10-CM

## 2017-12-11 DIAGNOSIS — Z171 Estrogen receptor negative status [ER-]: Secondary | ICD-10-CM | POA: Insufficient documentation

## 2017-12-11 DIAGNOSIS — D649 Anemia, unspecified: Secondary | ICD-10-CM | POA: Insufficient documentation

## 2017-12-11 DIAGNOSIS — Z170 Estrogen receptor positive status [ER+]: Secondary | ICD-10-CM

## 2017-12-11 DIAGNOSIS — Z7982 Long term (current) use of aspirin: Secondary | ICD-10-CM

## 2017-12-11 DIAGNOSIS — N3090 Cystitis, unspecified without hematuria: Secondary | ICD-10-CM

## 2017-12-11 DIAGNOSIS — K219 Gastro-esophageal reflux disease without esophagitis: Secondary | ICD-10-CM | POA: Insufficient documentation

## 2017-12-11 DIAGNOSIS — C50312 Malignant neoplasm of lower-inner quadrant of left female breast: Secondary | ICD-10-CM

## 2017-12-11 DIAGNOSIS — J45909 Unspecified asthma, uncomplicated: Secondary | ICD-10-CM

## 2017-12-11 DIAGNOSIS — I341 Nonrheumatic mitral (valve) prolapse: Secondary | ICD-10-CM

## 2017-12-11 DIAGNOSIS — Z87891 Personal history of nicotine dependence: Secondary | ICD-10-CM

## 2017-12-11 DIAGNOSIS — Z800 Family history of malignant neoplasm of digestive organs: Secondary | ICD-10-CM

## 2017-12-11 DIAGNOSIS — F419 Anxiety disorder, unspecified: Secondary | ICD-10-CM | POA: Insufficient documentation

## 2017-12-11 DIAGNOSIS — M810 Age-related osteoporosis without current pathological fracture: Secondary | ICD-10-CM | POA: Insufficient documentation

## 2017-12-11 DIAGNOSIS — K449 Diaphragmatic hernia without obstruction or gangrene: Secondary | ICD-10-CM | POA: Diagnosis not present

## 2017-12-11 DIAGNOSIS — Z8673 Personal history of transient ischemic attack (TIA), and cerebral infarction without residual deficits: Secondary | ICD-10-CM | POA: Diagnosis not present

## 2017-12-11 DIAGNOSIS — Z86010 Personal history of colonic polyps: Secondary | ICD-10-CM

## 2017-12-11 DIAGNOSIS — Z79899 Other long term (current) drug therapy: Secondary | ICD-10-CM

## 2017-12-11 DIAGNOSIS — K589 Irritable bowel syndrome without diarrhea: Secondary | ICD-10-CM | POA: Diagnosis not present

## 2017-12-11 DIAGNOSIS — I10 Essential (primary) hypertension: Secondary | ICD-10-CM | POA: Insufficient documentation

## 2017-12-11 DIAGNOSIS — IMO0002 Reserved for concepts with insufficient information to code with codable children: Secondary | ICD-10-CM

## 2017-12-11 DIAGNOSIS — Z5112 Encounter for antineoplastic immunotherapy: Secondary | ICD-10-CM

## 2017-12-11 LAB — CBC WITH DIFFERENTIAL/PLATELET
Basophils Absolute: 0 10*3/uL (ref 0–0.1)
Basophils Relative: 0 %
Eosinophils Absolute: 0.2 10*3/uL (ref 0–0.7)
Eosinophils Relative: 4 %
HCT: 33.1 % — ABNORMAL LOW (ref 35.0–47.0)
Hemoglobin: 11 g/dL — ABNORMAL LOW (ref 12.0–16.0)
Lymphocytes Relative: 18 %
Lymphs Abs: 1.1 10*3/uL (ref 1.0–3.6)
MCH: 27.9 pg (ref 26.0–34.0)
MCHC: 33.2 g/dL (ref 32.0–36.0)
MCV: 84 fL (ref 80.0–100.0)
Monocytes Absolute: 0.5 10*3/uL (ref 0.2–0.9)
Monocytes Relative: 9 %
Neutro Abs: 4.1 10*3/uL (ref 1.4–6.5)
Neutrophils Relative %: 69 %
Platelets: 210 10*3/uL (ref 150–440)
RBC: 3.94 MIL/uL (ref 3.80–5.20)
RDW: 14.2 % (ref 11.5–14.5)
WBC: 5.9 10*3/uL (ref 3.6–11.0)

## 2017-12-11 LAB — COMPREHENSIVE METABOLIC PANEL
ALT: 19 U/L (ref 0–44)
AST: 27 U/L (ref 15–41)
Albumin: 3.5 g/dL (ref 3.5–5.0)
Alkaline Phosphatase: 69 U/L (ref 38–126)
Anion gap: 8 (ref 5–15)
BUN: 13 mg/dL (ref 8–23)
CO2: 29 mmol/L (ref 22–32)
Calcium: 9 mg/dL (ref 8.9–10.3)
Chloride: 98 mmol/L (ref 98–111)
Creatinine, Ser: 0.64 mg/dL (ref 0.44–1.00)
GFR calc Af Amer: >60 mL/min (ref >60)
GFR calc non Af Amer: >60 mL/min (ref >60)
Glucose, Bld: 146 mg/dL — ABNORMAL HIGH (ref 70–99)
Potassium: 3.8 mmol/L (ref 3.5–5.1)
Sodium: 135 mmol/L (ref 135–145)
Total Bilirubin: 0.6 mg/dL (ref 0.3–1.2)
Total Protein: 6.4 g/dL — ABNORMAL LOW (ref 6.5–8.1)

## 2017-12-11 MED ORDER — DIPHENHYDRAMINE HCL 25 MG PO CAPS
25.0000 mg | ORAL_CAPSULE | Freq: Once | ORAL | Status: AC
  Administered 2017-12-11: 25 mg via ORAL
  Filled 2017-12-11: qty 1

## 2017-12-11 MED ORDER — HEPARIN SOD (PORK) LOCK FLUSH 100 UNIT/ML IV SOLN
500.0000 [IU] | Freq: Once | INTRAVENOUS | Status: AC
  Administered 2017-12-11: 500 [IU] via INTRAVENOUS
  Filled 2017-12-11: qty 5

## 2017-12-11 MED ORDER — ACETAMINOPHEN 325 MG PO TABS
650.0000 mg | ORAL_TABLET | Freq: Once | ORAL | Status: AC
  Administered 2017-12-11: 650 mg via ORAL
  Filled 2017-12-11: qty 2

## 2017-12-11 MED ORDER — TRASTUZUMAB CHEMO 150 MG IV SOLR
6.0000 mg/kg | Freq: Once | INTRAVENOUS | Status: AC
  Administered 2017-12-11: 336 mg via INTRAVENOUS
  Filled 2017-12-11: qty 16

## 2017-12-11 MED ORDER — HEPARIN SOD (PORK) LOCK FLUSH 100 UNIT/ML IV SOLN: 500.0000 [IU] | Freq: Once | INTRAVENOUS | Status: DC | PRN

## 2017-12-11 MED ORDER — SODIUM CHLORIDE 0.9 % IV SOLN
Freq: Once | INTRAVENOUS | Status: AC
  Administered 2017-12-11: 11:00:00 via INTRAVENOUS
  Filled 2017-12-11: qty 250

## 2017-12-11 MED ORDER — SODIUM CHLORIDE 0.9% FLUSH
10.0000 mL | Freq: Once | INTRAVENOUS | Status: AC
  Administered 2017-12-11: 10 mL via INTRAVENOUS
  Filled 2017-12-11: qty 10

## 2017-12-11 NOTE — Progress Notes (Signed)
No new changes noted today 

## 2017-12-11 NOTE — Progress Notes (Signed)
Hematology/Oncology Consult note [facility]  Telephone:([phone] [phone]  Patient Care Team: Rusty Aus, MD as PCP - General (Internal Medicine)   Name of the patient: Doris Barnes  [id number]  Aug 15, 1940   Date of visit: 12/11/17  Diagnosis- Stage IA invasive mammary carcinoma of the left breast ER negative, PR weakly positive and HER-2 positive   Chief complaint/ Reason for visit-on treatment assessment prior to his next cycle of Herceptin  Heme/Onc history: 1. Patient is a 77 year old female who underwent bilateral screening mammogram on 11/28/2016 which showed a possible mass and calcifications in her left breast. This was followed by diagnostic mammogram and an ultrasound of the left breast which showed: 1.4 x 1.2 x 1.3 cm irregular mass in the left breast at the 6:30 position 3 cm from the nipple. On magnification views the overall extent of the mass and calcifications measures 1.9 cm. Left axilla was evaluated with ultrasound and showed no enlarged and morphologically abnormal lymph nodes.   2. She underwent core biopsy of the left breast mass which showed: Invasive mammary carcinoma, grade 3. No DCIS or lymphovascular invasion. ER was negative and PR was 11-50% positive and HER-2 was +3 on IHC  3Menarche at the age of 54 years. G5 P5 L5. She did have twoprior biopsiesof her left breast which did not reveal any malignancy. No family history of breast or ovarian cancer. Her sister and maternal grandfather had colon cancer. She is considerably anxious today at the thought of getting chemotherapy. She does have a history of mitral valve prolapse and states that she has "irregular heartbeat"for which she is on atenolol. She had a Holter monitor in the past but was not found to have any arrhythmia.Baseline MUGA scan showed a normal EF of 64.5%  4. Patient underwent lumpectomy on 01/01/2017 which showed invasive mammary carcinoma, size  of tumor was 14 mm, grade 3 with negative margins. 0 out of one lymph nodes was positive for malignancy. There was associated DCIS. Lymphovascular invasion was negative. ER negative, PR 11-50% positive and HER-2/neu +3  5. Adjuvant taxol herceptin chemo started on 01/16/17.Patient had significant fatigue abdominal bloating and Cramping as well as nosebleeds from Taxol and did not wish to continue with Taxol. She received 5 cycles of weekly Taxol. chemotherapy was held for 3 weeks and did not wish to continue with chemotherapy at that time.Trial of Abraxane was given for cycle #9 of treatment which she tolerated very well.  6. Given that she had PR positive disease- risks benefits of hormone therapy discussed. arimidex started on 05/04/17 along with ca/vit D. She could not tolerate both arimidex and aromasin due to athralgias and abdominal pain   Interval history-patient is currently tolerating Herceptin well.  She reports no shortness of breath or leg swelling.  She feels well today  ECOG PS- 1 Pain scale- 0 Opioid associated constipation- no  Review of systems- ROS   Allergies  Allergen Reactions  . Imipramine Pamoate Shortness Of Breath  . Bisphosphonates Nausea Only  . Epinephrine Other (See Comments)    Difficulty breathing  . Prednisone Palpitations  . Sulfa Antibiotics Rash    Face turns red and stings     Past Medical History:  Diagnosis Date  . Anemia   . Anxiety   . Asthma   . Breast cancer ([place])   . Cancer ([place]) skin  . Chronic vulvitis   . Colon polyp   . Cystocele   . Diverticulitis   .  Diverticulitis   . Diverticulosis   . Diverticulosis   . Dyspareunia, female   . Dyspnea   . Dysrhythmia   . Fibrocystic disease of both breasts   . Gastritis   . GERD (gastroesophageal reflux disease)    gastritis also  . Hemorrhoids   . History of hiatal hernia   . Hypercholesteremia   . Hypertension   . IBS (irritable bowel syndrome)   . IC (interstitial  cystitis)   . Migraine   . Mitral valve prolapse   . OP (osteoporosis)   . Positional vertigo   . Rectocele   . Squamous cell carcinoma   . Stroke [facility])    TIA  . Vaginal atrophy      Past Surgical History:  Procedure Laterality Date  . APPENDECTOMY    . BREAST BIOPSY Left 1998   core bx- neg  . BREAST BIOPSY Left 2007   neg  . BREAST BIOPSY Left 12/14/2016   left breast US core path pending  . BREAST EXCISIONAL BIOPSY Left   . BREAST LUMPECTOMY Left 01/01/2017    INVASIVE MAMMARY CARCINOMA/Grade 3  . CARDIAC CATHETERIZATION N/A 01/18/2015   Procedure: Left Heart Cath and Coronary Angiography;  Surgeon: Teodoro Spray, MD;  Location: [place] [facility];  Service: Cardiovascular;  Laterality: N/A;  . CATARACT EXTRACTION W/PHACO Right 12/15/2015   Procedure: CATARACT EXTRACTION PHACO AND INTRAOCULAR LENS PLACEMENT ([facility]);  Surgeon: Estill Cotta, MD;  Location: [facility] ORS;  Service: Ophthalmology;  Laterality: Right;  [place] 01:20 AP% 23.9 CDE 35.49 Fluid pack lot # [id number]  . CATARACT EXTRACTION W/PHACO Left 12/29/2015   Procedure: CATARACT EXTRACTION PHACO AND INTRAOCULAR LENS PLACEMENT ([place]);  Surgeon: Estill Cotta, MD;  Location: [facility] ORS;  Service: Ophthalmology;  Laterality: Left;  [place]  01:20 AP% 25.1 CDE 32.86 Fluid pack lot # [id number]  . COLON SURGERY    . COLONOSCOPY WITH PROPOFOL N/A 02/21/2016   Procedure: COLONOSCOPY WITH PROPOFOL;  Surgeon: Manya Silvas, MD;  Location: [facility] ENDOSCOPY;  Service: Endoscopy;  Laterality: N/A;  . CORONARY ANGIOPLASTY    . DILATION AND CURETTAGE OF UTERUS    . ESOPHAGOGASTRODUODENOSCOPY (EGD) WITH PROPOFOL N/A 11/23/2017   Procedure: ESOPHAGOGASTRODUODENOSCOPY (EGD) WITH PROPOFOL;  Surgeon: Manya Silvas, MD;  Location: [facility] ENDOSCOPY;  Service: Endoscopy;  Laterality: N/A;  . OOPHORECTOMY Bilateral   . PARTIAL MASTECTOMY WITH NEEDLE LOCALIZATION Left 01/01/2017   Procedure: PARTIAL MASTECTOMY WITH NEEDLE LOCALIZATION;   Surgeon: Herbert Pun, MD;  Location: [facility] ORS;  Service: General;  Laterality: Left;  . PORTACATH PLACEMENT Right 01/01/2017   Procedure: INSERTION PORT-A-CATH;  Surgeon: Herbert Pun, MD;  Location: [facility] ORS;  Service: General;  Laterality: Right;  . SENTINEL NODE BIOPSY Left 01/01/2017   Procedure: SENTINEL NODE BIOPSY;  Surgeon: Herbert Pun, MD;  Location: [facility] ORS;  Service: General;  Laterality: Left;  . TONSILLECTOMY    . VAGINAL HYSTERECTOMY      Social History   Socioeconomic History  . Marital status: Married    Spouse name: Not on file  . Number of children: Not on file  . Years of education: Not on file  . Highest education level: Not on file  Occupational History  . Not on file  Social Needs  . Financial resource strain: Not on file  . Food insecurity:    Worry: Not on file    Inability: Not on file  . Transportation needs:    Medical: Not on file    Non-medical: Not on  file  Tobacco Use  . Smoking status: Former Smoker    Packs/day: 1.00    Years: 33.00    Pack years: 33.00    Last attempt to quit: 10/17/1987    Years since quitting: 30.1  . Smokeless tobacco: Never Used  Substance and Sexual Activity  . Alcohol use: No  . Drug use: No  . Sexual activity: Yes  Lifestyle  . Physical activity:    Days per week: Not on file    Minutes per session: Not on file  . Stress: Not on file  Relationships  . Social connections:    Talks on phone: Not on file    Gets together: Not on file    Attends religious service: Not on file    Active member of club or organization: Not on file    Attends meetings of clubs or organizations: Not on file    Relationship status: Not on file  . Intimate partner violence:    Fear of current or ex partner: Not on file    Emotionally abused: Not on file    Physically abused: Not on file    Forced sexual activity: Not on file  Other Topics Concern  . Not on file  Social History Narrative  . Not on  file    Family History  Problem Relation Age of Onset  . Diabetes Mother   . Diabetes Father   . Colon cancer Sister   . Diabetes Sister   . Diabetes Maternal Aunt   . Colon cancer Paternal Grandfather   . Lung cancer Brother   . Breast cancer Neg Hx   . Ovarian cancer Neg Hx      Current Outpatient Medications:  .  aspirin EC 325 MG tablet, Take 325 mg by mouth daily with breakfast. , Disp: , Rfl:  .  atenolol (TENORMIN) 50 MG tablet, Take 50 mg by mouth daily with breakfast. , Disp: , Rfl:  .  calcium citrate-vitamin D (CITRACAL+D) 315-200 MG-UNIT tablet, Take 1 tablet by mouth 2 (two) times daily., Disp: , Rfl:  .  FLUoxetine (PROZAC) 40 MG capsule, Take 40 mg by mouth at bedtime., Disp: , Rfl:  .  hydrochlorothiazide (HYDRODIURIL) 25 MG tablet, Take 25 mg by mouth daily. , Disp: , Rfl:  .  lansoprazole (PREVACID) 30 MG capsule, Take 30 mg by mouth daily before breakfast. , Disp: , Rfl:  .  loratadine (CLARITIN) 10 MG tablet, Take 10 mg by mouth daily as needed for allergies. , Disp: , Rfl:  .  Magnesium 250 MG TABS, Take 250 mg by mouth daily., Disp: , Rfl:  .  Multiple Vitamins-Minerals (PRESERVISION AREDS 2) CAPS, Take 1 capsule by mouth 2 (two) times daily. , Disp: , Rfl:  .  simvastatin (ZOCOR) 20 MG tablet, Take 20 mg by mouth daily at 8 pm., Disp: , Rfl:  .  telmisartan (MICARDIS) 80 MG tablet, Take 40 mg by mouth daily with breakfast., Disp: , Rfl:  .  triamcinolone ointment (KENALOG) 0.5 %, Apply 1 application topically 2 (two) times daily., Disp: 30 g, Rfl: 0 .  acetaminophen (TYLENOL) 325 MG tablet, Take 650 mg by mouth every 6 (six) hours as needed (for headaches.). , Disp: , Rfl:  .  ALPRAZolam (XANAX) 0.25 MG tablet, Take 0.25 mg by mouth at bedtime as needed for anxiety or sleep. , Disp: , Rfl:  .  anastrozole (ARIMIDEX) 1 MG tablet, Take 1 tablet (1 mg total) by mouth daily. (Patient not  taking: Reported on 12/11/2017), Disp: 30 tablet, Rfl: 3 .  dicyclomine  (BENTYL) 20 MG tablet, Take 1 tablet (20 mg total) by mouth 3 (three) times daily as needed for spasms. (Patient not taking: Reported on 12/11/2017), Disp: 30 tablet, Rfl: 0 .  fluticasone (FLONASE) 50 MCG/ACT nasal spray, Place 2 sprays into both nostrils daily as needed for allergies. , Disp: , Rfl:  .  letrozole (FEMARA) 2.5 MG tablet, Take 1 tablet (2.5 mg total) by mouth daily. (Patient not taking: Reported on 12/11/2017), Disp: 30 tablet, Rfl: 0 .  neomycin-polymyxin-pramoxine (NEOSPORIN PLUS) 1 % cream, Apply topically 2 (two) times daily. (Patient not taking: Reported on 12/11/2017), Disp: 14.2 g, Rfl: 0 .  oxyCODONE-acetaminophen (PERCOCET) 5-325 MG tablet, Take 1-2 tablets by mouth every 8 (eight) hours as needed. (Patient not taking: Reported on 12/11/2017), Disp: 20 tablet, Rfl: 0 .  polyethylene glycol (MIRALAX / GLYCOLAX) packet, Take 17 g by mouth daily as needed. , Disp: , Rfl:  .  simethicone (GAS-X) 80 MG chewable tablet, Chew 1 tablet (80 mg total) every 6 (six) hours as needed by mouth for flatulence. (Patient not taking: Reported on 12/11/2017), Disp: 30 tablet, Rfl: 0 .  traMADol (ULTRAM) 50 MG tablet, Take 1 tablet (50 mg total) by mouth every 12 (twelve) hours as needed. (Patient not taking: Reported on 12/11/2017), Disp: 12 tablet, Rfl: 0 No current facility-administered medications for this visit.   Facility-Administered Medications Ordered in Other Visits:  .  heparin lock flush 100 unit/mL, 500 Units, Intracatheter, Once PRN, Sindy Guadeloupe, MD  Physical exam:  Vitals:   12/11/17 1032  BP: (!) 148/70  Pulse: 64  Resp: 18  Temp: 97.7 F (36.5 C)  TempSrc: Tympanic  Weight: 123 lb 12.8 oz (56.2 kg)  Height: '5\' 2"'  (1.575 m)   Physical Exam   CMP Latest Ref Rng & Units 12/11/2017  Glucose 70 - 99 mg/dL 146(H)  BUN 8 - 23 mg/dL 13  Creatinine 0.44 - 1.00 mg/dL 0.64  Sodium 135 - 145 mmol/L 135  Potassium 3.5 - 5.1 mmol/L 3.8  Chloride 98 - 111 mmol/L 98  CO2 22 - 32  mmol/L 29  Calcium 8.9 - 10.3 mg/dL 9.0  Total Protein 6.5 - 8.1 g/dL 6.4(L)  Total Bilirubin 0.3 - 1.2 mg/dL 0.6  Alkaline Phos 38 - 126 U/L 69  AST 15 - 41 U/L 27  ALT 0 - 44 U/L 19   CBC Latest Ref Rng & Units 12/11/2017  WBC 3.6 - 11.0 K/uL 5.9  Hemoglobin 12.0 - 16.0 g/dL 11.0(L)  Hematocrit 35.0 - 47.0 % 33.1(L)  Platelets 150 - 440 K/uL 210      Assessment and plan- Patient is a 77 y.o. female pathologicalprognostic stage IA pT1c1p N0 cM0invasive mammary carcinoma of the left breast ER negative PR positive and HER-2 +3 on IHC s/p lumpectomy s/p adjuvant chemotherapy.  She is here for next treatment of adjuvant Herceptin  Counts okay to proceed with the next cycle of Herceptin today.  She will directly proceed with Herceptin in 3 weeks and I will see her back in 6 weeks with CBC and CMP and that would be her last cycle of Herceptin.  She first started Taxol Herceptin on 01/16/2017.  I will plan to check a repeat echocardiogram upon finishing Herceptin treatment.  Patient did have a weakly PR positive tumor but could not tolerate AI and she is currently off hormone therapy at this time   Visit Diagnosis 1.  Malignant neoplasm of lower-inner quadrant of left breast in female, estrogen receptor positive ([place])   2. Encounter for monoclonal antibody treatment for malignancy      Dr. Randa Evens, MD, MPH [facility] [phone] 12/11/2017 2:09 PM

## 2017-12-12 DIAGNOSIS — M810 Age-related osteoporosis without current pathological fracture: Secondary | ICD-10-CM | POA: Diagnosis not present

## 2017-12-12 DIAGNOSIS — E538 Deficiency of other specified B group vitamins: Secondary | ICD-10-CM | POA: Diagnosis not present

## 2017-12-12 DIAGNOSIS — K2960 Other gastritis without bleeding: Secondary | ICD-10-CM | POA: Diagnosis not present

## 2017-12-12 DIAGNOSIS — Z0000 Encounter for general adult medical examination without abnormal findings: Secondary | ICD-10-CM | POA: Diagnosis not present

## 2017-12-12 DIAGNOSIS — E782 Mixed hyperlipidemia: Secondary | ICD-10-CM | POA: Diagnosis not present

## 2017-12-21 ENCOUNTER — Ambulatory Visit: Payer: Medicare HMO

## 2017-12-21 ENCOUNTER — Ambulatory Visit: Payer: Medicare HMO | Admitting: Oncology

## 2017-12-21 ENCOUNTER — Other Ambulatory Visit: Payer: Medicare HMO

## 2017-12-26 ENCOUNTER — Other Ambulatory Visit: Payer: Self-pay | Admitting: Internal Medicine

## 2017-12-26 DIAGNOSIS — M810 Age-related osteoporosis without current pathological fracture: Secondary | ICD-10-CM | POA: Diagnosis not present

## 2017-12-26 DIAGNOSIS — Z853 Personal history of malignant neoplasm of breast: Secondary | ICD-10-CM

## 2017-12-31 ENCOUNTER — Other Ambulatory Visit: Payer: Self-pay

## 2017-12-31 ENCOUNTER — Ambulatory Visit
Admission: RE | Admit: 2017-12-31 | Discharge: 2017-12-31 | Disposition: A | Discharge status: Home / Self Care | Payer: Medicare HMO | Source: Ambulatory Visit | Attending: Radiation Oncology | Admitting: Radiation Oncology

## 2017-12-31 ENCOUNTER — Encounter: Payer: Self-pay | Admitting: Radiation Oncology

## 2017-12-31 DIAGNOSIS — Z923 Personal history of irradiation: Secondary | ICD-10-CM | POA: Diagnosis not present

## 2017-12-31 DIAGNOSIS — Z170 Estrogen receptor positive status [ER+]: Secondary | ICD-10-CM

## 2017-12-31 DIAGNOSIS — C50312 Malignant neoplasm of lower-inner quadrant of left female breast: Secondary | ICD-10-CM

## 2017-12-31 DIAGNOSIS — Z853 Personal history of malignant neoplasm of breast: Secondary | ICD-10-CM | POA: Diagnosis not present

## 2017-12-31 NOTE — Progress Notes (Signed)
Radiation Oncology Follow up Note  Name: Doris Barnes   Date:   12/31/2017 MRN:  [id number] DOB: March 21, 1941    This 77 y.o. female presents to the clinic today for six-month follow-up status post whole breast radiation to her left breast for stage I ER negative PR weakly positive HER-2/neu overexpressed invasive mammary carcinoma. She was status post wide local excision and adjuvant chemotherapy.  REFERRING PROVIDER: Rusty Aus, MD  HPI: patient is a 77 year old female now out 6 months having completed whole breast radiation to her left breast as was wide local excision for stage I ER negative PR weakly positive and HER-2/neu overexpressed invasive mammary carcinoma. She is seen today in routine follow-up doing well. She specifically denies breast tenderness cough or bone pain. She is not on antiestrogen therapy based on its side effects including dyspepsia. She is not yet had a.follow-up mammogram which is scheduled for October.  COMPLICATIONS OF TREATMENT: none  FOLLOW UP COMPLIANCE: keeps appointments   PHYSICAL EXAM:  BP (!) (P) 149/77 (BP Location: Left Arm, Patient Position: Sitting)   Pulse (P) 69   Temp (!) (P) 95.4 F (35.2 C) (Tympanic)   Wt (P) 125 lb 3.5 oz (56.8 kg)   SpO2 (P) 99%   BMI (P) 22.90 kg/m  Lungs are clear to A&P cardiac examination essentially unremarkable with regular rate and rhythm. No dominant mass or nodularity is noted in either breast in 2 positions examined. Incision is well-healed. No axillary or supraclavicular adenopathy is appreciated. Cosmetic result is excellent.there is some tethering of the nipple towards the incision although cosmetic result is still good to excellent.  RADIOLOGY RESULTS: mammograms are reviewed  PLAN: present time patient continues to do well with no evidence of disease. She has declined antiestrogen therapy. She is scheduled mammograms for next month. I have asked to see her back in 6 months for follow-up and then will  start once your follow-up visits. Patient is to call at anytime with any concerns.  I would like to take this opportunity to thank you for allowing me to participate in the care of your patient.Noreene Filbert, MD

## 2018-01-01 ENCOUNTER — Inpatient Hospital Stay: Payer: Medicare HMO | Attending: Oncology

## 2018-01-01 VITALS — BP 144/75 | HR 67 | Temp 97.0°F | Resp 18

## 2018-01-01 DIAGNOSIS — E7800 Pure hypercholesterolemia, unspecified: Secondary | ICD-10-CM | POA: Insufficient documentation

## 2018-01-01 DIAGNOSIS — K219 Gastro-esophageal reflux disease without esophagitis: Secondary | ICD-10-CM | POA: Diagnosis not present

## 2018-01-01 DIAGNOSIS — K589 Irritable bowel syndrome without diarrhea: Secondary | ICD-10-CM | POA: Insufficient documentation

## 2018-01-01 DIAGNOSIS — J45909 Unspecified asthma, uncomplicated: Secondary | ICD-10-CM | POA: Insufficient documentation

## 2018-01-01 DIAGNOSIS — Z87891 Personal history of nicotine dependence: Secondary | ICD-10-CM | POA: Diagnosis not present

## 2018-01-01 DIAGNOSIS — Z800 Family history of malignant neoplasm of digestive organs: Secondary | ICD-10-CM | POA: Insufficient documentation

## 2018-01-01 DIAGNOSIS — Z170 Estrogen receptor positive status [ER+]: Secondary | ICD-10-CM

## 2018-01-01 DIAGNOSIS — K449 Diaphragmatic hernia without obstruction or gangrene: Secondary | ICD-10-CM | POA: Insufficient documentation

## 2018-01-01 DIAGNOSIS — Z923 Personal history of irradiation: Secondary | ICD-10-CM | POA: Insufficient documentation

## 2018-01-01 DIAGNOSIS — C50312 Malignant neoplasm of lower-inner quadrant of left female breast: Secondary | ICD-10-CM | POA: Insufficient documentation

## 2018-01-01 DIAGNOSIS — Z171 Estrogen receptor negative status [ER-]: Secondary | ICD-10-CM | POA: Diagnosis not present

## 2018-01-01 DIAGNOSIS — Z86010 Personal history of colonic polyps: Secondary | ICD-10-CM | POA: Insufficient documentation

## 2018-01-01 DIAGNOSIS — Z5112 Encounter for antineoplastic immunotherapy: Secondary | ICD-10-CM | POA: Diagnosis not present

## 2018-01-01 DIAGNOSIS — Z9221 Personal history of antineoplastic chemotherapy: Secondary | ICD-10-CM | POA: Insufficient documentation

## 2018-01-01 DIAGNOSIS — Z79899 Other long term (current) drug therapy: Secondary | ICD-10-CM | POA: Diagnosis not present

## 2018-01-01 DIAGNOSIS — I10 Essential (primary) hypertension: Secondary | ICD-10-CM | POA: Insufficient documentation

## 2018-01-01 DIAGNOSIS — Z7982 Long term (current) use of aspirin: Secondary | ICD-10-CM | POA: Insufficient documentation

## 2018-01-01 DIAGNOSIS — I341 Nonrheumatic mitral (valve) prolapse: Secondary | ICD-10-CM | POA: Insufficient documentation

## 2018-01-01 DIAGNOSIS — Z8673 Personal history of transient ischemic attack (TIA), and cerebral infarction without residual deficits: Secondary | ICD-10-CM | POA: Insufficient documentation

## 2018-01-01 DIAGNOSIS — Z801 Family history of malignant neoplasm of trachea, bronchus and lung: Secondary | ICD-10-CM | POA: Diagnosis not present

## 2018-01-01 MED ORDER — SODIUM CHLORIDE 0.9 % IV SOLN
Freq: Once | INTRAVENOUS | Status: AC
  Administered 2018-01-01: 15:00:00 via INTRAVENOUS
  Filled 2018-01-01: qty 250

## 2018-01-01 MED ORDER — ACETAMINOPHEN 325 MG PO TABS
650.0000 mg | ORAL_TABLET | Freq: Once | ORAL | Status: AC
  Administered 2018-01-01: 650 mg via ORAL
  Filled 2018-01-01: qty 2

## 2018-01-01 MED ORDER — DIPHENHYDRAMINE HCL 25 MG PO CAPS
25.0000 mg | ORAL_CAPSULE | Freq: Once | ORAL | Status: AC
  Administered 2018-01-01: 25 mg via ORAL
  Filled 2018-01-01: qty 1

## 2018-01-01 MED ORDER — HEPARIN SOD (PORK) LOCK FLUSH 100 UNIT/ML IV SOLN
500.0000 [IU] | Freq: Once | INTRAVENOUS | Status: AC | PRN
  Administered 2018-01-01: 500 [IU]
  Filled 2018-01-01: qty 5

## 2018-01-01 MED ORDER — TRASTUZUMAB CHEMO 150 MG IV SOLR
6.0000 mg/kg | Freq: Once | INTRAVENOUS | Status: AC
  Administered 2018-01-01: 336 mg via INTRAVENOUS
  Filled 2018-01-01: qty 16

## 2018-01-08 ENCOUNTER — Other Ambulatory Visit: Payer: Medicare HMO

## 2018-01-11 ENCOUNTER — Ambulatory Visit
Admission: RE | Admit: 2018-01-11 | Discharge: 2018-01-11 | Disposition: A | Discharge status: Home / Self Care | Payer: Medicare HMO | Source: Ambulatory Visit | Attending: Internal Medicine | Admitting: Internal Medicine

## 2018-01-11 DIAGNOSIS — N6001 Solitary cyst of right breast: Secondary | ICD-10-CM | POA: Diagnosis not present

## 2018-01-11 DIAGNOSIS — N6324 Unspecified lump in the left breast, lower inner quadrant: Secondary | ICD-10-CM | POA: Diagnosis not present

## 2018-01-11 DIAGNOSIS — Z853 Personal history of malignant neoplasm of breast: Secondary | ICD-10-CM

## 2018-01-11 DIAGNOSIS — R928 Other abnormal and inconclusive findings on diagnostic imaging of breast: Secondary | ICD-10-CM | POA: Diagnosis not present

## 2018-01-11 HISTORY — DX: Personal history of irradiation: Z92.3

## 2018-01-11 HISTORY — DX: Personal history of antineoplastic chemotherapy: Z92.21

## 2018-01-22 ENCOUNTER — Inpatient Hospital Stay: Payer: Medicare HMO

## 2018-01-22 ENCOUNTER — Inpatient Hospital Stay (HOSPITAL_BASED_OUTPATIENT_CLINIC_OR_DEPARTMENT_OTHER): Payer: Medicare HMO | Admitting: Oncology

## 2018-01-22 ENCOUNTER — Encounter: Payer: Self-pay | Admitting: Oncology

## 2018-01-22 ENCOUNTER — Other Ambulatory Visit: Payer: Self-pay

## 2018-01-22 VITALS — BP 148/79 | HR 62 | Temp 97.8°F | Resp 18 | Ht 62.0 in | Wt 125.3 lb

## 2018-01-22 DIAGNOSIS — Z8673 Personal history of transient ischemic attack (TIA), and cerebral infarction without residual deficits: Secondary | ICD-10-CM

## 2018-01-22 DIAGNOSIS — I10 Essential (primary) hypertension: Secondary | ICD-10-CM

## 2018-01-22 DIAGNOSIS — Z7982 Long term (current) use of aspirin: Secondary | ICD-10-CM

## 2018-01-22 DIAGNOSIS — Z171 Estrogen receptor negative status [ER-]: Secondary | ICD-10-CM | POA: Diagnosis not present

## 2018-01-22 DIAGNOSIS — C50312 Malignant neoplasm of lower-inner quadrant of left female breast: Secondary | ICD-10-CM

## 2018-01-22 DIAGNOSIS — Z5112 Encounter for antineoplastic immunotherapy: Secondary | ICD-10-CM | POA: Diagnosis not present

## 2018-01-22 DIAGNOSIS — K449 Diaphragmatic hernia without obstruction or gangrene: Secondary | ICD-10-CM

## 2018-01-22 DIAGNOSIS — Z800 Family history of malignant neoplasm of digestive organs: Secondary | ICD-10-CM

## 2018-01-22 DIAGNOSIS — K589 Irritable bowel syndrome without diarrhea: Secondary | ICD-10-CM

## 2018-01-22 DIAGNOSIS — I341 Nonrheumatic mitral (valve) prolapse: Secondary | ICD-10-CM

## 2018-01-22 DIAGNOSIS — K219 Gastro-esophageal reflux disease without esophagitis: Secondary | ICD-10-CM

## 2018-01-22 DIAGNOSIS — J45909 Unspecified asthma, uncomplicated: Secondary | ICD-10-CM

## 2018-01-22 DIAGNOSIS — Z79899 Other long term (current) drug therapy: Secondary | ICD-10-CM

## 2018-01-22 DIAGNOSIS — Z87891 Personal history of nicotine dependence: Secondary | ICD-10-CM

## 2018-01-22 DIAGNOSIS — E7800 Pure hypercholesterolemia, unspecified: Secondary | ICD-10-CM | POA: Diagnosis not present

## 2018-01-22 DIAGNOSIS — Z170 Estrogen receptor positive status [ER+]: Principal | ICD-10-CM

## 2018-01-22 DIAGNOSIS — Z86010 Personal history of colonic polyps: Secondary | ICD-10-CM

## 2018-01-22 DIAGNOSIS — Z9221 Personal history of antineoplastic chemotherapy: Secondary | ICD-10-CM

## 2018-01-22 DIAGNOSIS — Z801 Family history of malignant neoplasm of trachea, bronchus and lung: Secondary | ICD-10-CM

## 2018-01-22 DIAGNOSIS — Z923 Personal history of irradiation: Secondary | ICD-10-CM

## 2018-01-22 LAB — COMPREHENSIVE METABOLIC PANEL
ALT: 18 U/L (ref 0–44)
AST: 23 U/L (ref 15–41)
Albumin: 3.6 g/dL (ref 3.5–5.0)
Alkaline Phosphatase: 72 U/L (ref 38–126)
Anion gap: 5 (ref 5–15)
BUN: 11 mg/dL (ref 8–23)
CO2: 30 mmol/L (ref 22–32)
Calcium: 9.1 mg/dL (ref 8.9–10.3)
Chloride: 97 mmol/L — ABNORMAL LOW (ref 98–111)
Creatinine, Ser: 0.59 mg/dL (ref 0.44–1.00)
GFR calc Af Amer: >60 mL/min (ref >60)
GFR calc non Af Amer: >60 mL/min (ref >60)
Glucose, Bld: 127 mg/dL — ABNORMAL HIGH (ref 70–99)
Potassium: 3.9 mmol/L (ref 3.5–5.1)
Sodium: 132 mmol/L — ABNORMAL LOW (ref 135–145)
Total Bilirubin: 0.5 mg/dL (ref 0.3–1.2)
Total Protein: 6.6 g/dL (ref 6.5–8.1)

## 2018-01-22 LAB — CBC WITH DIFFERENTIAL/PLATELET
Abs Immature Granulocytes: 0.01 10*3/uL (ref 0.00–0.07)
Basophils Absolute: 0 10*3/uL (ref 0.0–0.1)
Basophils Relative: 1 %
Eosinophils Absolute: 0.3 10*3/uL (ref 0.0–0.5)
Eosinophils Relative: 6 %
HCT: 33.4 % — ABNORMAL LOW (ref 36.0–46.0)
Hemoglobin: 10.7 g/dL — ABNORMAL LOW (ref 12.0–15.0)
Immature Granulocytes: 0 %
Lymphocytes Relative: 18 %
Lymphs Abs: 0.9 10*3/uL (ref 0.7–4.0)
MCH: 27.4 pg (ref 26.0–34.0)
MCHC: 32 g/dL (ref 30.0–36.0)
MCV: 85.4 fL (ref 80.0–100.0)
Monocytes Absolute: 0.5 10*3/uL (ref 0.1–1.0)
Monocytes Relative: 10 %
Neutro Abs: 3 10*3/uL (ref 1.7–7.7)
Neutrophils Relative %: 65 %
Platelets: 182 10*3/uL (ref 150–400)
RBC: 3.91 MIL/uL (ref 3.87–5.11)
RDW: 13.6 % (ref 11.5–15.5)
WBC: 4.7 10*3/uL (ref 4.0–10.5)
nRBC: 0 % (ref 0.0–0.2)

## 2018-01-22 NOTE — Progress Notes (Signed)
Hematology/Oncology Consult note [facility]  Telephone:([phone] [phone]  Patient Care Team: Rusty Aus, MD as PCP - General (Internal Medicine)   Name of the patient: Doris Barnes  [id number]  November 08, 1940   Date of visit: 01/22/18  Diagnosis- Stage IA invasive mammary carcinoma of the left breast ER negative, PR weakly positive and HER-2 positive  Chief complaint/ Reason for visit-routine follow-up of breast cancer  Heme/Onc history: Patient is a 77 year old female who was diagnosed with invasive mammary carcinoma of the left breast ER negative, PR 11 to 50% positive and HER-2/neu positive.  Tumor was grade 314 mm.  Lymph nodes were negative for malignancy.  She started adjuvant Taxol Herceptin chemotherapy in October 2018 and has completed 1 year of adjuvant Herceptin.  She is also completed adjuvant radiation.  She could not tolerate AI and did not wish to continue hormone therapy.  Interval history-patient feels well today and denies any complaints.  Denies any fatigue  ECOG PS- 1 Pain scale- 0 Opioid associated constipation- no  Review of systems- Review of Systems  Constitutional: Negative for chills, fever, malaise/fatigue and weight loss.  HENT: Negative for congestion, ear discharge and nosebleeds.   Eyes: Negative for blurred vision.  Respiratory: Negative for cough, hemoptysis, sputum production, shortness of breath and wheezing.   Cardiovascular: Negative for chest pain, palpitations, orthopnea and claudication.  Gastrointestinal: Negative for abdominal pain, blood in stool, constipation, diarrhea, heartburn, melena, nausea and vomiting.  Genitourinary: Negative for dysuria, flank pain, frequency, hematuria and urgency.  Musculoskeletal: Negative for back pain, joint pain and myalgias.  Skin: Negative for rash.  Neurological: Negative for dizziness, tingling, focal weakness, seizures, weakness and headaches.    Endo/Heme/Allergies: Does not bruise/bleed easily.  Psychiatric/Behavioral: Negative for depression and suicidal ideas. The patient does not have insomnia.       Allergies  Allergen Reactions  . Imipramine Pamoate Shortness Of Breath  . Bisphosphonates Nausea Only  . Epinephrine Other (See Comments)    Difficulty breathing  . Prednisone Palpitations  . Sulfa Antibiotics Rash    Face turns red and stings     Past Medical History:  Diagnosis Date  . Anemia   . Anxiety   . Asthma   . Breast cancer ([place]) 12/14/2016   left breast  . Cancer ([place]) skin  . Chronic vulvitis   . Colon polyp   . Cystocele   . Diverticulitis   . Diverticulitis   . Diverticulosis   . Diverticulosis   . Dyspareunia, female   . Dyspnea   . Dysrhythmia   . Fibrocystic disease of both breasts   . Gastritis   . GERD (gastroesophageal reflux disease)    gastritis also  . Hemorrhoids   . History of hiatal hernia   . Hypercholesteremia   . Hypertension   . IBS (irritable bowel syndrome)   . IC (interstitial cystitis)   . Migraine   . Mitral valve prolapse   . OP (osteoporosis)   . Personal history of chemotherapy   . Personal history of radiation therapy   . Positional vertigo   . Rectocele   . Squamous cell carcinoma   . Stroke [facility])    TIA  . Vaginal atrophy      Past Surgical History:  Procedure Laterality Date  . APPENDECTOMY    . BREAST BIOPSY Left 1998   core bx- neg  . BREAST BIOPSY Left 2007   neg  . BREAST BIOPSY Left 12/14/2016  left breast US core positive  . BREAST EXCISIONAL BIOPSY Left   . BREAST LUMPECTOMY Left 01/01/2017    INVASIVE MAMMARY CARCINOMA/Grade 3  . CARDIAC CATHETERIZATION N/A 01/18/2015   Procedure: Left Heart Cath and Coronary Angiography;  Surgeon: Teodoro Spray, MD;  Location: [place] CV LAB;  Service: Cardiovascular;  Laterality: N/A;  . CATARACT EXTRACTION W/PHACO Right 12/15/2015   Procedure: CATARACT EXTRACTION PHACO AND INTRAOCULAR  LENS PLACEMENT (IOC);  Surgeon: Estill Cotta, MD;  Location: [facility] ORS;  Service: Ophthalmology;  Laterality: Right;  [place] 01:20 AP% 23.9 CDE 35.49 Fluid pack lot # [id number]  . CATARACT EXTRACTION W/PHACO Left 12/29/2015   Procedure: CATARACT EXTRACTION PHACO AND INTRAOCULAR LENS PLACEMENT ([place]);  Surgeon: Estill Cotta, MD;  Location: [facility] ORS;  Service: Ophthalmology;  Laterality: Left;  [place]  01:20 AP% 25.1 CDE 32.86 Fluid pack lot # [id number]  . COLON SURGERY    . COLONOSCOPY WITH PROPOFOL N/A 02/21/2016   Procedure: COLONOSCOPY WITH PROPOFOL;  Surgeon: Manya Silvas, MD;  Location[facility] ENDOSCOPY;  Service: Endoscopy;  Laterality: N/A;  . CORONARY ANGIOPLASTY    . DILATION AND CURETTAGE OF UTERUS    . ESOPHAGOGASTRODUODENOSCOPY (EGD) WITH PROPOFOL N/A 11/23/2017   Procedure: ESOPHAGOGASTRODUODENOSCOPY (EGD) WITH PROPOFOL;  Surgeon: Manya Silvas, MD;  Location: [facility] ENDOSCOPY;  Service: Endoscopy;  Laterality: N/A;  . OOPHORECTOMY Bilateral   . PARTIAL MASTECTOMY WITH NEEDLE LOCALIZATION Left 01/01/2017   Procedure: PARTIAL MASTECTOMY WITH NEEDLE LOCALIZATION;  Surgeon: Herbert Pun, MD;  Location: [facility] ORS;  Service: General;  Laterality: Left;  . PORTACATH PLACEMENT Right 01/01/2017   Procedure: INSERTION PORT-A-CATH;  Surgeon: Herbert Pun, MD;  Location: [facility] ORS;  Service: General;  Laterality: Right;  . SENTINEL NODE BIOPSY Left 01/01/2017   Procedure: SENTINEL NODE BIOPSY;  Surgeon: Herbert Pun, MD;  Location: [facility] ORS;  Service: General;  Laterality: Left;  . TONSILLECTOMY    . VAGINAL HYSTERECTOMY      Social History   Socioeconomic History  . Marital status: Married    Spouse name: Not on file  . Number of children: Not on file  . Years of education: Not on file  . Highest education level: Not on file  Occupational History  . Not on file  Social Needs  . Financial resource strain: Not on file  . Food insecurity:    Worry: Not on  file    Inability: Not on file  . Transportation needs:    Medical: Not on file    Non-medical: Not on file  Tobacco Use  . Smoking status: Former Smoker    Packs/day: 1.00    Years: 33.00    Pack years: 33.00    Last attempt to quit: 10/17/1987    Years since quitting: 30.2  . Smokeless tobacco: Never Used  Substance and Sexual Activity  . Alcohol use: No  . Drug use: No  . Sexual activity: Yes  Lifestyle  . Physical activity:    Days per week: Not on file    Minutes per session: Not on file  . Stress: Not on file  Relationships  . Social connections:    Talks on phone: Not on file    Gets together: Not on file    Attends religious service: Not on file    Active member of club or organization: Not on file    Attends meetings of clubs or organizations: Not on file    Relationship status: Not on file  . Intimate partner violence:  Fear of current or ex partner: Not on file    Emotionally abused: Not on file    Physically abused: Not on file    Forced sexual activity: Not on file  Other Topics Concern  . Not on file  Social History Narrative  . Not on file    Family History  Problem Relation Age of Onset  . Diabetes Mother   . Diabetes Father   . Colon cancer Sister   . Diabetes Sister   . Diabetes Maternal Aunt   . Colon cancer Paternal Grandfather   . Lung cancer Brother   . Breast cancer Neg Hx   . Ovarian cancer Neg Hx      Current Outpatient Medications:  .  aspirin EC 325 MG tablet, Take 325 mg by mouth daily with breakfast. , Disp: , Rfl:  .  atenolol (TENORMIN) 50 MG tablet, Take 50 mg by mouth daily with breakfast. , Disp: , Rfl:  .  calcium citrate-vitamin D (CITRACAL+D) 315-200 MG-UNIT tablet, Take 1 tablet by mouth 2 (two) times daily., Disp: , Rfl:  .  FLUoxetine (PROZAC) 40 MG capsule, Take 40 mg by mouth at bedtime., Disp: , Rfl:  .  hydrochlorothiazide (HYDRODIURIL) 25 MG tablet, Take 25 mg by mouth daily. , Disp: , Rfl:  .  lansoprazole  (PREVACID) 30 MG capsule, Take 30 mg by mouth daily before breakfast. , Disp: , Rfl:  .  Magnesium 250 MG TABS, Take 250 mg by mouth daily., Disp: , Rfl:  .  simvastatin (ZOCOR) 20 MG tablet, Take 20 mg by mouth daily at 8 pm., Disp: , Rfl:  .  telmisartan (MICARDIS) 80 MG tablet, Take 40 mg by mouth daily with breakfast., Disp: , Rfl:  .  acetaminophen (TYLENOL) 325 MG tablet, Take 650 mg by mouth every 6 (six) hours as needed (for headaches.). , Disp: , Rfl:  .  ALPRAZolam (XANAX) 0.25 MG tablet, Take 0.25 mg by mouth at bedtime as needed for anxiety or sleep. , Disp: , Rfl:  .  anastrozole (ARIMIDEX) 1 MG tablet, Take 1 tablet (1 mg total) by mouth daily. (Patient not taking: Reported on 12/11/2017), Disp: 30 tablet, Rfl: 3 .  dicyclomine (BENTYL) 20 MG tablet, Take 1 tablet (20 mg total) by mouth 3 (three) times daily as needed for spasms. (Patient not taking: Reported on 12/11/2017), Disp: 30 tablet, Rfl: 0 .  fluticasone (FLONASE) 50 MCG/ACT nasal spray, Place 2 sprays into both nostrils daily as needed for allergies. , Disp: , Rfl:  .  letrozole (FEMARA) 2.5 MG tablet, Take 1 tablet (2.5 mg total) by mouth daily. (Patient not taking: Reported on 12/11/2017), Disp: 30 tablet, Rfl: 0 .  loratadine (CLARITIN) 10 MG tablet, Take 10 mg by mouth daily as needed for allergies. , Disp: , Rfl:  .  Multiple Vitamins-Minerals (PRESERVISION AREDS 2) CAPS, Take 1 capsule by mouth 2 (two) times daily. , Disp: , Rfl:  .  neomycin-polymyxin-pramoxine (NEOSPORIN PLUS) 1 % cream, Apply topically 2 (two) times daily. (Patient not taking: Reported on 12/11/2017), Disp: 14.2 g, Rfl: 0 .  oxyCODONE-acetaminophen (PERCOCET) 5-325 MG tablet, Take 1-2 tablets by mouth every 8 (eight) hours as needed. (Patient not taking: Reported on 12/11/2017), Disp: 20 tablet, Rfl: 0 .  polyethylene glycol (MIRALAX / GLYCOLAX) packet, Take 17 g by mouth daily as needed. , Disp: , Rfl:  .  simethicone (GAS-X) 80 MG chewable tablet, Chew 1  tablet (80 mg total) every 6 (six) hours as  needed by mouth for flatulence. (Patient not taking: Reported on 12/11/2017), Disp: 30 tablet, Rfl: 0 .  traMADol (ULTRAM) 50 MG tablet, Take 1 tablet (50 mg total) by mouth every 12 (twelve) hours as needed. (Patient not taking: Reported on 12/11/2017), Disp: 12 tablet, Rfl: 0 .  triamcinolone ointment (KENALOG) 0.5 %, Apply 1 application topically 2 (two) times daily. (Patient not taking: Reported on 01/22/2018), Disp: 30 g, Rfl: 0  Physical exam:  Vitals:   01/22/18 1054  BP: (!) 148/79  Pulse: 62  Resp: 18  Temp: 97.8 F (36.6 C)  TempSrc: Tympanic  SpO2: 98%  Weight: 125 lb 4.8 oz (56.8 kg)  Height: _0  (1.575 m)   Physical Exam  Constitutional: She is oriented to person, place, and time. She appears well-developed and well-nourished.  HENT:  Head: Normocephalic and atraumatic.  Eyes: Pupils are equal, round, and reactive to light. EOM are normal.  Neck: Normal range of motion.  Cardiovascular: Normal rate, regular rhythm and normal heart sounds.  Pulmonary/Chest: Effort normal and breath sounds normal.  Abdominal: Soft. Bowel sounds are normal.  Neurological: She is alert and oriented to person, place, and time.  Skin: Skin is warm and dry.     CMP Latest Ref Rng & Units 01/22/2018  Glucose 70 - 99 mg/dL 127(H)  BUN 8 - 23 mg/dL 11  Creatinine 0.44 - 1.00 mg/dL 0.59  Sodium 135 - 145 mmol/L 132(L)  Potassium 3.5 - 5.1 mmol/L 3.9  Chloride 98 - 111 mmol/L 97(L)  CO2 22 - 32 mmol/L 30  Calcium 8.9 - 10.3 mg/dL 9.1  Total Protein 6.5 - 8.1 g/dL 6.6  Total Bilirubin 0.3 - 1.2 mg/dL 0.5  Alkaline Phos 38 - 126 U/L 72  AST 15 - 41 U/L 23  ALT 0 - 44 U/L 18   CBC Latest Ref Rng & Units 01/22/2018  WBC 4.0 - 10.5 K/uL 4.7  Hemoglobin 12.0 - 15.0 g/dL 10.7(L)  Hematocrit 36.0 - 46.0 % 33.4(L)  Platelets 150 - 400 K/uL 182    No images are attached to the encounter.  US Breast Ltd Uni Left Inc Axilla  Result Date:  01/11/2018 CLINICAL DATA:  Malignant lumpectomy of the LOWER INNER QUADRANT of the LEFT breast in October, 2018 for grade 3 invasive mammary carcinoma. Adjuvant chemotherapy and radiation therapy. Initial post lumpectomy annual evaluation EXAM: DIGITAL DIAGNOSTIC BILATERAL MAMMOGRAM WITH CAD AND TOMO LIMITED ULTRASOUND BILATERAL BREASTS COMPARISON:  Mammography 01/01/2017 (LEFT), 12/14/2016 (LEFT), 12/07/2016 (LEFT), 11/28/2016 (BILATERAL) and earlier. LEFT breast ultrasound 12/07/2016. No prior RIGHT breast ultrasound. ACR Breast Density Category b: There are scattered areas of fibroglandular density. FINDINGS: Tomosynthesis and synthesized full field CC and MLO views of both breasts were obtained. Standard spot magnification CC view of the lumpectomy site in the LEFT breast was also obtained. Post surgical scar/architectural distortion at the lumpectomy site in the LOWER INNER LEFT breast at ANTERIOR to POSTERIOR depth. Circumscribed low-density mass at the lumpectomy site measuring 5-6 cm likely representing a seroma. No suspicious findings elsewhere in the LEFT breast New circumscribed low-density mass involving the OUTER RIGHT breast at MIDDLE depth measuring approximately 6 mm without associated architectural distortion or suspicious calcifications. No suspicious findings elsewhere in the RIGHT breast. Stable benign degenerated fibroadenoma involving the UPPER INNER QUADRANT at POSTERIOR depth. Mammographic images were processed with CAD. On physical exam, there is a palpable fluctuant mass in the INNER LEFT breast which the patient states has been there essentially since surgery and has  potentially decreased in size since that time. Targeted RIGHT breast ultrasound is performed, showing an oval circumscribed parallel anechoic mass at the 8:30 o'clock position approximately 4 cm from nipple measuring approximately 2 x 5 x 4 mm, demonstrating posterior acoustic enhancement and no internal power Doppler flow,  corresponding to the mammographic finding. No suspicious solid mass or abnormal acoustic shadowing is identified. Port-A-Cath port overlies the RIGHT axilla on the MLO view Targeted LEFT breast ultrasound is performed, showing an oval circumscribed parallel anechoic mass at the lumpectomy site at the 8 o'clock position approximately 5 cm from nipple measuring approximately 2.7 x 5.1 x 4.7 cm, demonstrating posterior acoustic enhancement and no internal power Doppler flow, corresponding to the mammographic finding. No suspicious solid mass or abnormal acoustic shadowing is identified. IMPRESSION: 1. No mammographic or sonographic evidence of malignancy involving either breast. 2. Benign approximate 5 cm postoperative seroma at the lumpectomy site in the LEFT breast. 3. Benign 5 mm cyst in the LOWER OUTER RIGHT breast corresponding to a mammographic finding. RECOMMENDATION: BILATERAL diagnostic mammography in 1 year. I have discussed the findings and recommendations with the patient. Results were also provided in writing at the conclusion of the visit. If applicable, a reminder letter will be sent to the patient regarding the next appointment. BI-RADS CATEGORY  2: Benign. Electronically Signed   By: Evangeline Dakin M.D.   On: 01/11/2018 12:05   US Breast Ltd Uni Right Inc Axilla  Result Date: 01/11/2018 CLINICAL DATA:  Malignant lumpectomy of the LOWER INNER QUADRANT of the LEFT breast in October, 2018 for grade 3 invasive mammary carcinoma. Adjuvant chemotherapy and radiation therapy. Initial post lumpectomy annual evaluation EXAM: DIGITAL DIAGNOSTIC BILATERAL MAMMOGRAM WITH CAD AND TOMO LIMITED ULTRASOUND BILATERAL BREASTS COMPARISON:  Mammography 01/01/2017 (LEFT), 12/14/2016 (LEFT), 12/07/2016 (LEFT), 11/28/2016 (BILATERAL) and earlier. LEFT breast ultrasound 12/07/2016. No prior RIGHT breast ultrasound. ACR Breast Density Category b: There are scattered areas of fibroglandular density. FINDINGS:  Tomosynthesis and synthesized full field CC and MLO views of both breasts were obtained. Standard spot magnification CC view of the lumpectomy site in the LEFT breast was also obtained. Post surgical scar/architectural distortion at the lumpectomy site in the LOWER INNER LEFT breast at ANTERIOR to POSTERIOR depth. Circumscribed low-density mass at the lumpectomy site measuring 5-6 cm likely representing a seroma. No suspicious findings elsewhere in the LEFT breast New circumscribed low-density mass involving the OUTER RIGHT breast at MIDDLE depth measuring approximately 6 mm without associated architectural distortion or suspicious calcifications. No suspicious findings elsewhere in the RIGHT breast. Stable benign degenerated fibroadenoma involving the UPPER INNER QUADRANT at POSTERIOR depth. Mammographic images were processed with CAD. On physical exam, there is a palpable fluctuant mass in the INNER LEFT breast which the patient states has been there essentially since surgery and has potentially decreased in size since that time. Targeted RIGHT breast ultrasound is performed, showing an oval circumscribed parallel anechoic mass at the 8:30 o'clock position approximately 4 cm from nipple measuring approximately 2 x 5 x 4 mm, demonstrating posterior acoustic enhancement and no internal power Doppler flow, corresponding to the mammographic finding. No suspicious solid mass or abnormal acoustic shadowing is identified. Port-A-Cath port overlies the RIGHT axilla on the MLO view Targeted LEFT breast ultrasound is performed, showing an oval circumscribed parallel anechoic mass at the lumpectomy site at the 8 o'clock position approximately 5 cm from nipple measuring approximately 2.7 x 5.1 x 4.7 cm, demonstrating posterior acoustic enhancement and no internal power Doppler flow, corresponding  to the mammographic finding. No suspicious solid mass or abnormal acoustic shadowing is identified. IMPRESSION: 1. No mammographic  or sonographic evidence of malignancy involving either breast. 2. Benign approximate 5 cm postoperative seroma at the lumpectomy site in the LEFT breast. 3. Benign 5 mm cyst in the LOWER OUTER RIGHT breast corresponding to a mammographic finding. RECOMMENDATION: BILATERAL diagnostic mammography in 1 year. I have discussed the findings and recommendations with the patient. Results were also provided in writing at the conclusion of the visit. If applicable, a reminder letter will be sent to the patient regarding the next appointment. BI-RADS CATEGORY  2: Benign. Electronically Signed   By: Evangeline Dakin M.D.   On: 01/11/2018 12:05   Mm Diag Breast Tomo Bilateral  Result Date: 01/11/2018 CLINICAL DATA:  Malignant lumpectomy of the LOWER INNER QUADRANT of the LEFT breast in October, 2018 for grade 3 invasive mammary carcinoma. Adjuvant chemotherapy and radiation therapy. Initial post lumpectomy annual evaluation EXAM: DIGITAL DIAGNOSTIC BILATERAL MAMMOGRAM WITH CAD AND TOMO LIMITED ULTRASOUND BILATERAL BREASTS COMPARISON:  Mammography 01/01/2017 (LEFT), 12/14/2016 (LEFT), 12/07/2016 (LEFT), 11/28/2016 (BILATERAL) and earlier. LEFT breast ultrasound 12/07/2016. No prior RIGHT breast ultrasound. ACR Breast Density Category b: There are scattered areas of fibroglandular density. FINDINGS: Tomosynthesis and synthesized full field CC and MLO views of both breasts were obtained. Standard spot magnification CC view of the lumpectomy site in the LEFT breast was also obtained. Post surgical scar/architectural distortion at the lumpectomy site in the LOWER INNER LEFT breast at ANTERIOR to POSTERIOR depth. Circumscribed low-density mass at the lumpectomy site measuring 5-6 cm likely representing a seroma. No suspicious findings elsewhere in the LEFT breast New circumscribed low-density mass involving the OUTER RIGHT breast at MIDDLE depth measuring approximately 6 mm without associated architectural distortion or  suspicious calcifications. No suspicious findings elsewhere in the RIGHT breast. Stable benign degenerated fibroadenoma involving the UPPER INNER QUADRANT at POSTERIOR depth. Mammographic images were processed with CAD. On physical exam, there is a palpable fluctuant mass in the INNER LEFT breast which the patient states has been there essentially since surgery and has potentially decreased in size since that time. Targeted RIGHT breast ultrasound is performed, showing an oval circumscribed parallel anechoic mass at the 8:30 o'clock position approximately 4 cm from nipple measuring approximately 2 x 5 x 4 mm, demonstrating posterior acoustic enhancement and no internal power Doppler flow, corresponding to the mammographic finding. No suspicious solid mass or abnormal acoustic shadowing is identified. Port-A-Cath port overlies the RIGHT axilla on the MLO view Targeted LEFT breast ultrasound is performed, showing an oval circumscribed parallel anechoic mass at the lumpectomy site at the 8 o'clock position approximately 5 cm from nipple measuring approximately 2.7 x 5.1 x 4.7 cm, demonstrating posterior acoustic enhancement and no internal power Doppler flow, corresponding to the mammographic finding. No suspicious solid mass or abnormal acoustic shadowing is identified. IMPRESSION: 1. No mammographic or sonographic evidence of malignancy involving either breast. 2. Benign approximate 5 cm postoperative seroma at the lumpectomy site in the LEFT breast. 3. Benign 5 mm cyst in the LOWER OUTER RIGHT breast corresponding to a mammographic finding. RECOMMENDATION: BILATERAL diagnostic mammography in 1 year. I have discussed the findings and recommendations with the patient. Results were also provided in writing at the conclusion of the visit. If applicable, a reminder letter will be sent to the patient regarding the next appointment. BI-RADS CATEGORY  2: Benign. Electronically Signed   By: Evangeline Dakin M.D.   On:  01/11/2018 12:05  Assessment and plan- Patient is a 77 y.o. female pathologicalprognostic stage IA pT1c1p N0 [id number] mammary carcinoma of the left breast ER negative PR positive and HER-2 +3 on IHC s/p lumpectomy s/p adjuvant chemotherapy.   She has also completed 1 year of adjuvant Herceptin and is here for routine follow-up  Patient has completed 1 year of adjuvant Herceptin chemotherapy.  We will touch base with Dr. Peyton Najjar regarding port removal at this time.  Patient has ER negative and a weakly PR positive tumor.  She has not been able to tolerate AI in the past and is currently off hormone therapy.  We will repeat her echocardiogram in 1 month's time.  I will see her back in 3 months with no labs   Visit Diagnosis 1. Malignant neoplasm of lower-inner quadrant of left breast in female, estrogen receptor positive ([place])   2. High risk medication use      Dr. Randa Evens, MD, MPH [facility] [phone] 01/22/2018 3:21 PM

## 2018-01-22 NOTE — Progress Notes (Signed)
No new changes noted today 

## 2018-01-24 ENCOUNTER — Telehealth: Payer: Self-pay | Admitting: *Deleted

## 2018-01-24 NOTE — Telephone Encounter (Signed)
Called patient and left message that her port can be removed on November 4 when I called back the patient had another appointment with cardiology on the same day so it would not work out.  So the new date is November 8 at 830 she should arrive 10 minutes early to the appointment for Dr. Peyton Najjar.  Patient does not have any appointments on that day and she will make it to the appointment to get her port removed.  Order for the port removed is already been faxed to Dr. Deniece Ree office

## 2018-01-31 DIAGNOSIS — I10 Essential (primary) hypertension: Secondary | ICD-10-CM | POA: Diagnosis not present

## 2018-01-31 DIAGNOSIS — I341 Nonrheumatic mitral (valve) prolapse: Secondary | ICD-10-CM | POA: Diagnosis not present

## 2018-01-31 DIAGNOSIS — E782 Mixed hyperlipidemia: Secondary | ICD-10-CM | POA: Diagnosis not present

## 2018-01-31 DIAGNOSIS — I25119 Atherosclerotic heart disease of native coronary artery with unspecified angina pectoris: Secondary | ICD-10-CM | POA: Diagnosis not present

## 2018-02-01 DIAGNOSIS — Z95828 Presence of other vascular implants and grafts: Secondary | ICD-10-CM | POA: Diagnosis not present

## 2018-02-14 ENCOUNTER — Ambulatory Visit
Admission: RE | Admit: 2018-02-14 | Discharge: 2018-02-14 | Disposition: A | Discharge status: Home / Self Care | Payer: Medicare HMO | Source: Ambulatory Visit | Attending: Oncology | Admitting: Oncology

## 2018-02-14 DIAGNOSIS — I119 Hypertensive heart disease without heart failure: Secondary | ICD-10-CM | POA: Diagnosis not present

## 2018-02-14 DIAGNOSIS — I080 Rheumatic disorders of both mitral and aortic valves: Secondary | ICD-10-CM | POA: Diagnosis not present

## 2018-02-14 DIAGNOSIS — C50312 Malignant neoplasm of lower-inner quadrant of left female breast: Secondary | ICD-10-CM

## 2018-02-14 DIAGNOSIS — Z79899 Other long term (current) drug therapy: Secondary | ICD-10-CM | POA: Insufficient documentation

## 2018-02-14 DIAGNOSIS — Z170 Estrogen receptor positive status [ER+]: Secondary | ICD-10-CM | POA: Diagnosis not present

## 2018-02-14 NOTE — Progress Notes (Signed)
*  PRELIMINARY RESULTS* Echocardiogram 2D Echocardiogram has been performed.  Doris Barnes 02/14/2018, 10:40 AM

## 2018-03-06 ENCOUNTER — Emergency Department
Admission: EM | Admit: 2018-03-06 | Discharge: 2018-03-06 | Disposition: A | Discharge status: Home / Self Care | Payer: Medicare HMO | Attending: Emergency Medicine | Admitting: Emergency Medicine

## 2018-03-06 ENCOUNTER — Other Ambulatory Visit: Payer: Self-pay

## 2018-03-06 ENCOUNTER — Encounter: Payer: Self-pay | Admitting: Emergency Medicine

## 2018-03-06 ENCOUNTER — Emergency Department: Payer: Medicare HMO

## 2018-03-06 DIAGNOSIS — W19XXXA Unspecified fall, initial encounter: Secondary | ICD-10-CM

## 2018-03-06 DIAGNOSIS — Z87891 Personal history of nicotine dependence: Secondary | ICD-10-CM | POA: Insufficient documentation

## 2018-03-06 DIAGNOSIS — W109XXA Fall (on) (from) unspecified stairs and steps, initial encounter: Secondary | ICD-10-CM | POA: Diagnosis not present

## 2018-03-06 DIAGNOSIS — Z853 Personal history of malignant neoplasm of breast: Secondary | ICD-10-CM | POA: Insufficient documentation

## 2018-03-06 DIAGNOSIS — Z79899 Other long term (current) drug therapy: Secondary | ICD-10-CM | POA: Insufficient documentation

## 2018-03-06 DIAGNOSIS — Y92008 Other place in unspecified non-institutional (private) residence as the place of occurrence of the external cause: Secondary | ICD-10-CM | POA: Diagnosis not present

## 2018-03-06 DIAGNOSIS — J45909 Unspecified asthma, uncomplicated: Secondary | ICD-10-CM | POA: Diagnosis not present

## 2018-03-06 DIAGNOSIS — Z8673 Personal history of transient ischemic attack (TIA), and cerebral infarction without residual deficits: Secondary | ICD-10-CM | POA: Diagnosis not present

## 2018-03-06 DIAGNOSIS — Y92009 Unspecified place in unspecified non-institutional (private) residence as the place of occurrence of the external cause: Secondary | ICD-10-CM

## 2018-03-06 DIAGNOSIS — Y939 Activity, unspecified: Secondary | ICD-10-CM | POA: Insufficient documentation

## 2018-03-06 DIAGNOSIS — M25532 Pain in left wrist: Secondary | ICD-10-CM | POA: Diagnosis not present

## 2018-03-06 DIAGNOSIS — Y999 Unspecified external cause status: Secondary | ICD-10-CM | POA: Insufficient documentation

## 2018-03-06 DIAGNOSIS — I10 Essential (primary) hypertension: Secondary | ICD-10-CM | POA: Diagnosis not present

## 2018-03-06 DIAGNOSIS — Z7982 Long term (current) use of aspirin: Secondary | ICD-10-CM | POA: Insufficient documentation

## 2018-03-06 DIAGNOSIS — S63502A Unspecified sprain of left wrist, initial encounter: Secondary | ICD-10-CM | POA: Diagnosis not present

## 2018-03-06 DIAGNOSIS — S6992XA Unspecified injury of left wrist, hand and finger(s), initial encounter: Secondary | ICD-10-CM | POA: Diagnosis not present

## 2018-03-06 NOTE — ED Provider Notes (Addendum)
[facility], [place] Emergency Department Provider Note   ____________________________________________   None    (approximate)  I have reviewed the triage vital signs and the nursing notes.   HISTORY  Chief Complaint Wrist Injury  HPI Doris Barnes is a 77 y.o. female presents to ED with complaint of left wrist pain.  Patient states that she fell last evening going into her home.  She states the steps were slick and she fell landing on her left wrist.  She is continued to have pain since that time.  She has been using her husband's wrist brace for support.  She denies any previous fractures to her left wrist.  Denies any head injury or loss of consciousness during her fall.  Currently she rates her pain as an 8 out of 10.  Past Medical History:  Diagnosis Date  . Anemia   . Anxiety   . Asthma   . Breast cancer ([place]) 12/14/2016   left breast  . Cancer ([place]) skin  . Chronic vulvitis   . Colon polyp   . Cystocele   . Diverticulitis   . Diverticulitis   . Diverticulosis   . Diverticulosis   . Dyspareunia, female   . Dyspnea   . Dysrhythmia   . Fibrocystic disease of both breasts   . Gastritis   . GERD (gastroesophageal reflux disease)    gastritis also  . Hemorrhoids   . History of hiatal hernia   . Hypercholesteremia   . Hypertension   . IBS (irritable bowel syndrome)   . IC (interstitial cystitis)   . Migraine   . Mitral valve prolapse   . OP (osteoporosis)   . Personal history of chemotherapy   . Personal history of radiation therapy   . Positional vertigo   . Rectocele   . Squamous cell carcinoma   . Stroke [facility])    TIA  . Vaginal atrophy     Patient Active Problem List   Diagnosis Date Noted  . Encounter for monoclonal antibody treatment for malignancy 02/20/2017  . Neutropenia ([place]) 01/30/2017  . Goals of care, counseling/discussion 01/09/2017  . Breast cancer ([place]) 12/21/2016  . TIA (transient ischemic attack) 02/22/2016  .  Ischemic colitis ([place]) 02/22/2016  . Psoriasiform dermatitis 01/07/2015  . Bloodgood disease 12/31/2014  . HLD (hyperlipidemia) 12/31/2014  . Headache, migraine 12/31/2014  . Billowing mitral valve 12/31/2014  . Osteoporosis, post-menopausal 12/31/2014  . Squamous cell carcinoma 12/31/2014  . Essential hypertension 09/18/2014  . Degeneration of cervical intervertebral disc 09/18/2014  . Ataxia 09/17/2014  . Adaptive colitis 02/02/2014    Past Surgical History:  Procedure Laterality Date  . APPENDECTOMY    . BREAST BIOPSY Left 1998   core bx- neg  . BREAST BIOPSY Left 2007   neg  . BREAST BIOPSY Left 12/14/2016   left breast US core positive  . BREAST EXCISIONAL BIOPSY Left   . BREAST LUMPECTOMY Left 01/01/2017    INVASIVE MAMMARY CARCINOMA/Grade 3  . CARDIAC CATHETERIZATION N/A 01/18/2015   Procedure: Left Heart Cath and Coronary Angiography;  Surgeon: Teodoro Spray, MD;  Location: [place] CV [facility];  Service: Cardiovascular;  Laterality: N/A;  . CATARACT EXTRACTION W/PHACO Right 12/15/2015   Procedure: CATARACT EXTRACTION PHACO AND INTRAOCULAR LENS PLACEMENT (IOC);  Surgeon: Estill Cotta, MD;  Location: [facility] ORS;  Service: Ophthalmology;  Laterality: Right;  [place] 01:20 AP% 23.9 CDE 35.49 Fluid pack lot # [id number]  . CATARACT EXTRACTION W/PHACO Left 12/29/2015   Procedure: CATARACT  EXTRACTION PHACO AND INTRAOCULAR LENS PLACEMENT (IOC);  Surgeon: Estill Cotta, MD;  Location: [facility] ORS;  Service: Ophthalmology;  Laterality: Left;  [place]  01:20 AP% 25.1 CDE 32.86 Fluid pack lot # [id number]  . COLON SURGERY    . COLONOSCOPY WITH PROPOFOL N/A 02/21/2016   Procedure: COLONOSCOPY WITH PROPOFOL;  Surgeon: Manya Silvas, MD;  Location: [facility] ENDOSCOPY;  Service: Endoscopy;  Laterality: N/A;  . CORONARY ANGIOPLASTY    . DILATION AND CURETTAGE OF UTERUS    . ESOPHAGOGASTRODUODENOSCOPY (EGD) WITH PROPOFOL N/A 11/23/2017   Procedure: ESOPHAGOGASTRODUODENOSCOPY (EGD) WITH  PROPOFOL;  Surgeon: Manya Silvas, MD;  Location: [facility] ENDOSCOPY;  Service: Endoscopy;  Laterality: N/A;  . OOPHORECTOMY Bilateral   . PARTIAL MASTECTOMY WITH NEEDLE LOCALIZATION Left 01/01/2017   Procedure: PARTIAL MASTECTOMY WITH NEEDLE LOCALIZATION;  Surgeon: Herbert Pun, MD;  Location: [facility] ORS;  Service: General;  Laterality: Left;  . PORTACATH PLACEMENT Right 01/01/2017   Procedure: INSERTION PORT-A-CATH;  Surgeon: Herbert Pun, MD;  Location: [facility] ORS;  Service: General;  Laterality: Right;  . SENTINEL NODE BIOPSY Left 01/01/2017   Procedure: SENTINEL NODE BIOPSY;  Surgeon: Herbert Pun, MD;  Location: [facility] ORS;  Service: General;  Laterality: Left;  . TONSILLECTOMY    . VAGINAL HYSTERECTOMY      Prior to Admission medications   Medication Sig Start Date End Date Taking? Authorizing Provider  acetaminophen (TYLENOL) 325 MG tablet Take 650 mg by mouth every 6 (six) hours as needed (for headaches.).     [provider]  ALPRAZolam Duanne Moron) 0.25 MG tablet Take 0.25 mg by mouth at bedtime as needed for anxiety or sleep.  11/17/14   [provider]  aspirin EC 325 MG tablet Take 325 mg by mouth daily with breakfast.     [provider]  atenolol (TENORMIN) 50 MG tablet Take 50 mg by mouth daily with breakfast.  07/24/14   [provider]  calcium citrate-vitamin D (CITRACAL+D) 315-200 MG-UNIT tablet Take 1 tablet by mouth 2 (two) times daily.    [provider]  FLUoxetine (PROZAC) 40 MG capsule Take 40 mg by mouth at bedtime. 12/18/16   [provider]  fluticasone (FLONASE) 50 MCG/ACT nasal spray Place 2 sprays into both nostrils daily as needed for allergies.     [provider]  hydrochlorothiazide (HYDRODIURIL) 25 MG tablet Take 25 mg by mouth daily.  07/24/14   [provider]  lansoprazole (PREVACID) 30 MG capsule Take 30 mg by mouth daily before breakfast.  07/24/14   [provider]  loratadine (CLARITIN) 10 MG tablet Take 10 mg by mouth daily as needed for allergies.  03/19/14   [provider]  Magnesium 250 MG TABS Take 250 mg by mouth daily.    [provider]  Multiple Vitamins-Minerals (PRESERVISION AREDS 2) CAPS Take 1 capsule by mouth 2 (two) times daily.     [provider]  polyethylene glycol (MIRALAX / GLYCOLAX) packet Take 17 g by mouth daily as needed.     [provider]  simvastatin (ZOCOR) 20 MG tablet Take 20 mg by mouth daily at 8 pm. 11/04/16   [provider]  telmisartan (MICARDIS) 80 MG tablet Take 40 mg by mouth daily with breakfast. 11/23/16   [provider]  prochlorperazine (COMPAZINE) 10 MG tablet Take 1 tablet (10 mg total) by mouth every 6 (six) hours as needed (Nausea or vomiting). 01/04/17 04/06/17  Sindy Guadeloupe, MD    Allergies Imipramine  pamoate; Bisphosphonates; Epinephrine; Prednisone; and Sulfa antibiotics  Family History  Problem Relation Age of Onset  . Diabetes Mother   . Diabetes Father   . Colon cancer Sister   . Diabetes Sister   . Diabetes Maternal Aunt   . Colon cancer Paternal Grandfather   . Lung cancer Brother   . Breast cancer Neg Hx   . Ovarian cancer Neg Hx     Social History Social History   Tobacco Use  . Smoking status: Former Smoker    Packs/day: 1.00    Years: 33.00    Pack years: 33.00    Last attempt to quit: 10/17/1987    Years since quitting: 30.4  . Smokeless tobacco: Never Used  Substance Use Topics  . Alcohol use: No  . Drug use: No    Review of Systems Constitutional: No fever/chills Eyes: No visual changes. ENT: No trauma. Cardiovascular: Denies chest pain. Respiratory: Denies shortness of breath. Musculoskeletal: Positive for left wrist pain. Skin: Negative for rash. Neurological: Negative for headaches, focal weakness or numbness. ___________________________________________   PHYSICAL EXAM:  VITAL SIGNS: ED Triage  Vitals  Enc Vitals Group     BP 03/06/18 0841 (!) 170/60     Pulse Rate 03/06/18 0841 67     Resp 03/06/18 0841 16     Temp 03/06/18 0841 97.7 F (36.5 C)     Temp Source 03/06/18 0841 Oral     SpO2 03/06/18 0841 97 %     Weight 03/06/18 0838 125 lb 3.5 oz (56.8 kg)     Height --      Head Circumference --      Peak Flow --      Pain Score 03/06/18 0837 8     Pain Loc --      Pain Edu? --      Excl. in [place]? --    Constitutional: Alert and oriented. Well appearing and in no acute distress. Eyes: Conjunctivae are normal.  Head: Atraumatic. Neck: No stridor.  No cervical spine tenderness on palpation posteriorly. Cardiovascular: Normal rate, regular rhythm. Grossly normal heart sounds.  Good peripheral circulation. Respiratory: Normal respiratory effort.  No retractions. Lungs CTAB. Musculoskeletal: Examination of left wrist there is moderate soft tissue swelling present.  No gross deformity is appreciated but difficult to tell because of soft tissue swelling.  There is an ecchymotic area over the distal ulnar dorsal aspect.  Range of motion is restricted secondary to pain.  Motor sensory function intact distal to the injury.  No skin abrasions are present.  Capillary refill is less than 3 seconds. Neurologic:  Normal speech and language. No gross focal neurologic deficits are appreciated. Skin:  Skin is warm, dry and intact.  Psychiatric: Mood and affect are normal. Speech and behavior are normal.  ____________________________________________   LABS (all labs ordered are listed, but only abnormal results are displayed)  Labs Reviewed - No data to display  RADIOLOGY  ED MD interpretation:   Left wrist x-ray is negative for acute bony injury.  Official radiology report(s): Dg Wrist Complete Left  Result Date: 03/06/2018 CLINICAL DATA:  Left wrist pain secondary to a fall last night. EXAM: LEFT WRIST - COMPLETE 3+ VIEW COMPARISON:  None. FINDINGS: There's no fracture or  dislocation. There's slight arthritis at the first [facility] joint and between the scaphoid and trapezium. Slight soft tissue swelling of the distal forearm. IMPRESSION: No acute osseous abnormality. Electronically Signed   By: Lorriane Shire M.D.   On:  03/06/2018 09:44    ____________________________________________   PROCEDURES  Procedure(s) performed:   .Splint Application Date/Time: 40/12/2723 11:13 AM Performed by: Brayton Caves, NT Authorized by: Johnn Hai, PA-C   Consent:    Consent obtained:  Verbal   Consent given by:  Patient   Risks discussed:  Pain and swelling Pre-procedure details:    Sensation:  Normal Procedure details:    Laterality:  Left   Location:  Wrist   Wrist:  L wrist   Strapping: yes     Splint type:  Wrist   Supplies:  Prefabricated splint Post-procedure details:    Pain:  Improved   Sensation:  Normal   Patient tolerance of procedure:  Tolerated well, no immediate complications    Critical Care performed: No  ____________________________________________   INITIAL IMPRESSION / ASSESSMENT AND PLAN / ED COURSE  As part of my medical decision making, I reviewed the following data within the electronic MEDICAL RECORD NUMBER Notes from prior ED visits and [place] Controlled Substance Database  She presents to the ED with complaint of left wrist pain after a mechanical fall last night at her home.  Patient denied any head injury or loss of consciousness.  She states the only injury she  sustained was her left wrist.  She has taken Tylenol and used her husband's wrist splint for support during the night.  X-ray was negative for acute bony injury however patient was made aware that most likely she sprained her wrist when she fell.  Patient is to elevate and use ice as needed for swelling and to reduce pain.  Patient is going to take Tylenol as she states that her husband is having surgery tomorrow and she does not want to be  sedated.  ____________________________________________   FINAL CLINICAL IMPRESSION(S) / ED DIAGNOSES  Final diagnoses:  Sprain of left wrist, initial encounter  Fall at home, initial encounter     ED Discharge Orders    None       Note:  This document was prepared using Dragon voice recognition software and may include unintentional dictation errors.    Johnn Hai, PA-C 03/06/18 1116    Johnn Hai, PA-C 03/06/18 1118    Earleen Newport, MD 03/06/18 1128

## 2018-03-06 NOTE — ED Triage Notes (Signed)
Mechanical fall last night.  Injured left wrist.

## 2018-03-06 NOTE — Discharge Instructions (Signed)
Follow-up with your primary care provider if any continued problems.  Take Tylenol as needed for pain.  Ice and elevate.  Wear splint for protection and support.

## 2018-04-23 ENCOUNTER — Encounter: Payer: Self-pay | Admitting: Oncology

## 2018-04-23 ENCOUNTER — Inpatient Hospital Stay: Payer: Medicare HMO | Attending: Oncology | Admitting: Oncology

## 2018-04-23 ENCOUNTER — Inpatient Hospital Stay: Payer: Medicare HMO

## 2018-04-23 VITALS — BP 122/76 | HR 64 | Temp 98.3°F | Resp 18 | Ht 62.0 in | Wt 124.8 lb

## 2018-04-23 DIAGNOSIS — Z171 Estrogen receptor negative status [ER-]: Secondary | ICD-10-CM | POA: Insufficient documentation

## 2018-04-23 DIAGNOSIS — I10 Essential (primary) hypertension: Secondary | ICD-10-CM | POA: Insufficient documentation

## 2018-04-23 DIAGNOSIS — Z923 Personal history of irradiation: Secondary | ICD-10-CM | POA: Insufficient documentation

## 2018-04-23 DIAGNOSIS — Z853 Personal history of malignant neoplasm of breast: Secondary | ICD-10-CM | POA: Diagnosis not present

## 2018-04-23 DIAGNOSIS — R51 Headache: Secondary | ICD-10-CM | POA: Insufficient documentation

## 2018-04-23 DIAGNOSIS — Z87891 Personal history of nicotine dependence: Secondary | ICD-10-CM | POA: Diagnosis not present

## 2018-04-23 DIAGNOSIS — Z7982 Long term (current) use of aspirin: Secondary | ICD-10-CM | POA: Diagnosis not present

## 2018-04-23 DIAGNOSIS — Z79899 Other long term (current) drug therapy: Secondary | ICD-10-CM | POA: Diagnosis not present

## 2018-04-23 DIAGNOSIS — Z08 Encounter for follow-up examination after completed treatment for malignant neoplasm: Secondary | ICD-10-CM

## 2018-04-23 NOTE — Progress Notes (Signed)
HERE FOR F/U , SHE HAS BEEN HAVING ha ON AN OFF.. SHE IS GOING TO CALL PCP TO CHECK ABOUT A SCAN-SHE HAS MULTIPLE FAMILY MEMBERS THAT HAS ANEURSYM.

## 2018-04-23 NOTE — Progress Notes (Signed)
Survivorship Care Plan visit completed.  Treatment summary reviewed and given to patient.  ASCO answers booklet reviewed and given to patient.  CARE program and Cancer Transitions discussed with patient along with other resources cancer center offers to patients and caregivers.  Patient verbalized understanding.    

## 2018-04-26 ENCOUNTER — Encounter: Payer: Self-pay | Admitting: Oncology

## 2018-04-26 NOTE — Progress Notes (Signed)
Hematology/Oncology Consult note [facility]  [phone] [phone]  Patient Care Team: Rusty Aus, MD as PCP - General (Internal Medicine) Sindy Guadeloupe, MD as Consulting Physician (Oncology) Herbert Pun, MD as Consulting Physician (General Surgery) Rico Junker, RN as Oncology Nurse Navigator Noreene Filbert, MD as Referring Physician (Radiation Oncology)   Name of the patient: Doris Barnes  [id number]  [birth year]   Date of visit: 04/26/18  Diagnosis- Stage IA invasive mammary carcinoma of the left breast ER negative, PR weakly positive and HER-2 positive  Chief complaint/ Reason for visit-routine follow-up of breast cancer  Heme/Onc history:  Patient is a 78 year old female who was diagnosed with invasive mammary carcinoma of the left breast ER negative, PR 11 to 50% positive and HER-2/neu positive.  Tumor was grade 314 mm.  Lymph nodes were negative for malignancy.  She started adjuvant Taxol Herceptin chemotherapy in October 2018 and has completed 1 year of adjuvant Herceptin.  She is also completed adjuvant radiation.  She could not tolerate AI and did not wish to continue hormone therapy.  Interval history-she was having some on and off headache and will be discussing with pcp soon. She has family history of aneurysms and some of her family members have died from it.  Other than that she feels well and denies any new complaints today.  Her appetite is good and she denies any unintentional weight loss.  Denies any new aches or pains anywhere  ECOG PS- 1 Pain scale- 0 Opioid associated constipation- no  Review of systems- Review of Systems  Constitutional: Positive for malaise/fatigue. Negative for chills, fever and weight loss.  HENT: Negative for congestion, ear discharge and nosebleeds.   Eyes: Negative for blurred vision.  Respiratory: Negative for cough, hemoptysis, sputum production, shortness of breath  and wheezing.   Cardiovascular: Negative for chest pain, palpitations, orthopnea and claudication.  Gastrointestinal: Negative for abdominal pain, blood in stool, constipation, diarrhea, heartburn, melena, nausea and vomiting.  Genitourinary: Negative for dysuria, flank pain, frequency, hematuria and urgency.  Musculoskeletal: Negative for back pain, joint pain and myalgias.  Skin: Negative for rash.  Neurological: Positive for headaches. Negative for dizziness, tingling, focal weakness, seizures and weakness.  Endo/Heme/Allergies: Does not bruise/bleed easily.  Psychiatric/Behavioral: Negative for depression and suicidal ideas. The patient does not have insomnia.       Allergies  Allergen Reactions  . Imipramine Pamoate Shortness Of Breath  . Bisphosphonates Nausea Only  . Epinephrine Other (See Comments)    Difficulty breathing  . Prednisone Palpitations  . Sulfa Antibiotics Rash    Face turns red and stings     Past Medical History:  Diagnosis Date  . Anemia   . Anxiety   . Asthma   . Breast cancer ([place]) 12/14/2016   left breast  . Cancer ([place]) skin  . Chronic vulvitis   . Colon polyp   . Cystocele   . Diverticulitis   . Diverticulitis   . Diverticulosis   . Diverticulosis   . Dyspareunia, female   . Dyspnea   . Dysrhythmia   . Fibrocystic disease of both breasts   . Gastritis   . GERD (gastroesophageal reflux disease)    gastritis also  . Hemorrhoids   . History of hiatal hernia   . Hypercholesteremia   . Hypertension   . IBS (irritable bowel syndrome)   . IC (interstitial cystitis)   . Migraine   . Mitral valve prolapse   .  OP (osteoporosis)   . Personal history of chemotherapy   . Personal history of radiation therapy   . Positional vertigo   . Rectocele   . Squamous cell carcinoma   . Stroke [facility])    TIA  . Vaginal atrophy      Past Surgical History:  Procedure Laterality Date  . APPENDECTOMY    . BREAST BIOPSY Left 1998   core bx- neg  .  BREAST BIOPSY Left 2007   neg  . BREAST BIOPSY Left 12/14/2016   left breast US core positive  . BREAST EXCISIONAL BIOPSY Left   . BREAST LUMPECTOMY Left 01/01/2017    INVASIVE MAMMARY CARCINOMA/Grade 3  . CARDIAC CATHETERIZATION N/A 01/18/2015   Procedure: Left Heart Cath and Coronary Angiography;  Surgeon: Teodoro Spray, MD;  Location: [place] CV LAB;  Service: Cardiovascular;  Laterality: N/A;  . CATARACT EXTRACTION W/PHACO Right 12/15/2015   Procedure: CATARACT EXTRACTION PHACO AND INTRAOCULAR LENS PLACEMENT (IOC);  Surgeon: Estill Cotta, MD;  Location: [facility] ORS;  Service: Ophthalmology;  Laterality: Right;  [place] 01:20 AP% 23.9 CDE 35.49 Fluid pack lot # [id number]  . CATARACT EXTRACTION W/PHACO Left 12/29/2015   Procedure: CATARACT EXTRACTION PHACO AND INTRAOCULAR LENS PLACEMENT ([place]);  Surgeon: Estill Cotta, MD;  Location: [facility] ORS;  Service: Ophthalmology;  Laterality: Left;  [place]  01:20 AP% 25.1 CDE 32.86 Fluid pack lot # [id number]  . COLON SURGERY    . COLONOSCOPY WITH PROPOFOL N/A 02/21/2016   Procedure: COLONOSCOPY WITH PROPOFOL;  Surgeon: Manya Silvas, MD;  Location: [facility] ENDOSCOPY;  Service: Endoscopy;  Laterality: N/A;  . CORONARY ANGIOPLASTY    . DILATION AND CURETTAGE OF UTERUS    . ESOPHAGOGASTRODUODENOSCOPY (EGD) WITH PROPOFOL N/A 11/23/2017   Procedure: ESOPHAGOGASTRODUODENOSCOPY (EGD) WITH PROPOFOL;  Surgeon: Manya Silvas, MD;  Location: [facility] ENDOSCOPY;  Service: Endoscopy;  Laterality: N/A;  . OOPHORECTOMY Bilateral   . PARTIAL MASTECTOMY WITH NEEDLE LOCALIZATION Left 01/01/2017   Procedure: PARTIAL MASTECTOMY WITH NEEDLE LOCALIZATION;  Surgeon: Herbert Pun, MD;  Location: [facility] ORS;  Service: General;  Laterality: Left;  . PORTACATH PLACEMENT Right 01/01/2017   Procedure: INSERTION PORT-A-CATH;  Surgeon: Herbert Pun, MD;  Location: [facility] ORS;  Service: General;  Laterality: Right;  . SENTINEL NODE BIOPSY Left 01/01/2017   Procedure:  SENTINEL NODE BIOPSY;  Surgeon: Herbert Pun, MD;  Location: [facility] ORS;  Service: General;  Laterality: Left;  . TONSILLECTOMY    . VAGINAL HYSTERECTOMY      Social History   Socioeconomic History  . Marital status: Married    Spouse name: Not on file  . Number of children: Not on file  . Years of education: Not on file  . Highest education level: Not on file  Occupational History  . Not on file  Social Needs  . Financial resource strain: Not on file  . Food insecurity:    Worry: Not on file    Inability: Not on file  . Transportation needs:    Medical: Not on file    Non-medical: Not on file  Tobacco Use  . Smoking status: Former Smoker    Packs/day: 1.00    Years: 33.00    Pack years: 33.00    Last attempt to quit: 10/17/1987    Years since quitting: 30.5  . Smokeless tobacco: Never Used  Substance and Sexual Activity  . Alcohol use: No  . Drug use: No  . Sexual activity: Yes  Lifestyle  . Physical activity:  Days per week: Not on file    Minutes per session: Not on file  . Stress: Not on file  Relationships  . Social connections:    Talks on phone: Not on file    Gets together: Not on file    Attends religious service: Not on file    Active member of club or organization: Not on file    Attends meetings of clubs or organizations: Not on file    Relationship status: Not on file  . Intimate partner violence:    Fear of current or ex partner: Not on file    Emotionally abused: Not on file    Physically abused: Not on file    Forced sexual activity: Not on file  Other Topics Concern  . Not on file  Social History Narrative  . Not on file    Family History  Problem Relation Age of Onset  . Diabetes Mother   . Diabetes Father   . Colon cancer Sister   . Diabetes Sister   . Diabetes Maternal Aunt   . Colon cancer Paternal Grandfather   . Lung cancer Brother   . Breast cancer Neg Hx   . Ovarian cancer Neg Hx      Current Outpatient  Medications:  .  acetaminophen (TYLENOL) 325 MG tablet, Take 650 mg by mouth every 6 (six) hours as needed (for headaches.). , Disp: , Rfl:  .  ALPRAZolam (XANAX) 0.25 MG tablet, Take 0.25 mg by mouth at bedtime as needed for anxiety or sleep. , Disp: , Rfl:  .  aspirin EC 325 MG tablet, Take 325 mg by mouth daily with breakfast. , Disp: , Rfl:  .  atenolol (TENORMIN) 50 MG tablet, Take 50 mg by mouth daily with breakfast. , Disp: , Rfl:  .  calcium citrate-vitamin D (CITRACAL+D) 315-200 MG-UNIT tablet, Take 1 tablet by mouth 2 (two) times daily., Disp: , Rfl:  .  FLUoxetine (PROZAC) 40 MG capsule, Take 40 mg by mouth at bedtime., Disp: , Rfl:  .  fluticasone (FLONASE) 50 MCG/ACT nasal spray, Place 2 sprays into both nostrils daily as needed for allergies. , Disp: , Rfl:  .  hydrochlorothiazide (HYDRODIURIL) 25 MG tablet, Take 25 mg by mouth daily. , Disp: , Rfl:  .  lansoprazole (PREVACID) 30 MG capsule, Take 30 mg by mouth daily before breakfast. , Disp: , Rfl:  .  loratadine (CLARITIN) 10 MG tablet, Take 10 mg by mouth daily as needed for allergies. , Disp: , Rfl:  .  Magnesium 250 MG TABS, Take 250 mg by mouth daily., Disp: , Rfl:  .  Multiple Vitamins-Minerals (PRESERVISION AREDS 2) CAPS, Take 1 capsule by mouth 2 (two) times daily. , Disp: , Rfl:  .  polyethylene glycol (MIRALAX / GLYCOLAX) packet, Take 17 g by mouth daily as needed. , Disp: , Rfl:  .  simvastatin (ZOCOR) 20 MG tablet, Take 20 mg by mouth daily at 8 pm., Disp: , Rfl:  .  telmisartan (MICARDIS) 80 MG tablet, Take 40 mg by mouth daily with breakfast., Disp: , Rfl:   Physical exam:  Vitals:   04/23/18 1439  BP: 122/76  Pulse: 64  Resp: 18  Temp: 98.3 F (36.8 C)  TempSrc: Tympanic  Weight: 124 lb 12.8 oz (56.6 kg)  Height: '5\' 2"'  (1.575 m)   Physical Exam Constitutional:      General: She is not in acute distress. HENT:     Head: Normocephalic and atraumatic.  Eyes:  Pupils: Pupils are equal, round, and  reactive to light.  Neck:     Musculoskeletal: Normal range of motion.  Cardiovascular:     Rate and Rhythm: Normal rate and regular rhythm.     Heart sounds: Normal heart sounds.  Pulmonary:     Effort: Pulmonary effort is normal.     Breath sounds: Normal breath sounds.  Abdominal:     General: Bowel sounds are normal.     Palpations: Abdomen is soft.  Skin:    General: Skin is warm and dry.  Neurological:     Mental Status: She is alert and oriented to person, place, and time.    Breast exam was performed in seated and lying down position. Patient is status post left lumpectomy with a well-healed surgical scar. No evidence of any palpable masses. No evidence of axillary adenopathy. No evidence of any palpable masses or lumps in the right breast. No evidence of right axillary adenopathy   CMP Latest Ref Rng & Units 01/22/2018  Glucose 70 - 99 mg/dL 127(H)  BUN 8 - 23 mg/dL 11  Creatinine 0.44 - 1.00 mg/dL 0.59  Sodium 135 - 145 mmol/L 132(L)  Potassium 3.5 - 5.1 mmol/L 3.9  Chloride 98 - 111 mmol/L 97(L)  CO2 22 - 32 mmol/L 30  Calcium 8.9 - 10.3 mg/dL 9.1  Total Protein 6.5 - 8.1 g/dL 6.6  Total Bilirubin 0.3 - 1.2 mg/dL 0.5  Alkaline Phos 38 - 126 U/L 72  AST 15 - 41 U/L 23  ALT 0 - 44 U/L 18   CBC Latest Ref Rng & Units 01/22/2018  WBC 4.0 - 10.5 K/uL 4.7  Hemoglobin 12.0 - 15.0 g/dL 10.7(L)  Hematocrit 36.0 - 46.0 % 33.4(L)  Platelets 150 - 400 K/uL 182     Assessment and plan- Patient is a 78 y.o. female pathologicalprognostic stage IA pT1c1p N0 cM0invasive mammary carcinoma of the left breast ER negative PR positive and HER-2 +3 on IHC s/p lumpectomy s/p adjuvant chemotherapy.  She has also completed 1 year of adjuvant Herceptin.  She is here for routine follow-up of breast cancer  Recent echocardiogram done after completion of Herceptin shows a normal EF of 55 to 60%.  Her port is out.  Clinically she is doing well and there is no evidence of recurrence on  today's exam. She will be due for a repeat mammogram in October 2020.  She is going to be discussing her ongoing headaches with her primary care doctor.  If her headaches persist she will let us know and I will consider doing an MRI at that time  I will see her back in 3 months no labs   Visit Diagnosis 1. Encounter for follow-up surveillance of breast cancer      Dr. Randa Evens, MD, MPH [facility] at [facility] [phone] 04/26/2018 11:31 AM

## 2018-06-05 DIAGNOSIS — E782 Mixed hyperlipidemia: Secondary | ICD-10-CM | POA: Diagnosis not present

## 2018-06-05 DIAGNOSIS — K2960 Other gastritis without bleeding: Secondary | ICD-10-CM | POA: Diagnosis not present

## 2018-06-05 DIAGNOSIS — E538 Deficiency of other specified B group vitamins: Secondary | ICD-10-CM | POA: Diagnosis not present

## 2018-06-07 DIAGNOSIS — K589 Irritable bowel syndrome without diarrhea: Secondary | ICD-10-CM | POA: Diagnosis not present

## 2018-06-07 DIAGNOSIS — K2960 Other gastritis without bleeding: Secondary | ICD-10-CM | POA: Diagnosis not present

## 2018-06-07 DIAGNOSIS — Z86010 Personal history of colonic polyps: Secondary | ICD-10-CM | POA: Diagnosis not present

## 2018-06-07 DIAGNOSIS — K219 Gastro-esophageal reflux disease without esophagitis: Secondary | ICD-10-CM | POA: Diagnosis not present

## 2018-06-12 DIAGNOSIS — Z0000 Encounter for general adult medical examination without abnormal findings: Secondary | ICD-10-CM | POA: Diagnosis not present

## 2018-06-12 DIAGNOSIS — E782 Mixed hyperlipidemia: Secondary | ICD-10-CM | POA: Diagnosis not present

## 2018-06-12 DIAGNOSIS — M810 Age-related osteoporosis without current pathological fracture: Secondary | ICD-10-CM | POA: Diagnosis not present

## 2018-06-12 DIAGNOSIS — I25119 Atherosclerotic heart disease of native coronary artery with unspecified angina pectoris: Secondary | ICD-10-CM | POA: Diagnosis not present

## 2018-06-12 DIAGNOSIS — C50312 Malignant neoplasm of lower-inner quadrant of left female breast: Secondary | ICD-10-CM | POA: Diagnosis not present

## 2018-07-03 DIAGNOSIS — L570 Actinic keratosis: Secondary | ICD-10-CM | POA: Diagnosis not present

## 2018-07-03 DIAGNOSIS — X32XXXA Exposure to sunlight, initial encounter: Secondary | ICD-10-CM | POA: Diagnosis not present

## 2018-07-03 DIAGNOSIS — L820 Inflamed seborrheic keratosis: Secondary | ICD-10-CM | POA: Diagnosis not present

## 2018-07-03 DIAGNOSIS — C44729 Squamous cell carcinoma of skin of left lower limb, including hip: Secondary | ICD-10-CM | POA: Diagnosis not present

## 2018-07-03 DIAGNOSIS — R58 Hemorrhage, not elsewhere classified: Secondary | ICD-10-CM | POA: Diagnosis not present

## 2018-07-03 DIAGNOSIS — L538 Other specified erythematous conditions: Secondary | ICD-10-CM | POA: Diagnosis not present

## 2018-07-03 DIAGNOSIS — D485 Neoplasm of uncertain behavior of skin: Secondary | ICD-10-CM | POA: Diagnosis not present

## 2018-07-09 DIAGNOSIS — C44729 Squamous cell carcinoma of skin of left lower limb, including hip: Secondary | ICD-10-CM | POA: Diagnosis not present

## 2018-07-17 ENCOUNTER — Ambulatory Visit: Payer: Medicare HMO | Admitting: Radiation Oncology

## 2018-07-23 ENCOUNTER — Ambulatory Visit: Payer: Medicare HMO | Admitting: Radiation Oncology

## 2018-07-23 ENCOUNTER — Ambulatory Visit: Payer: Medicare HMO | Admitting: Oncology

## 2018-08-01 DIAGNOSIS — I10 Essential (primary) hypertension: Secondary | ICD-10-CM | POA: Diagnosis not present

## 2018-08-01 DIAGNOSIS — E782 Mixed hyperlipidemia: Secondary | ICD-10-CM | POA: Diagnosis not present

## 2018-08-01 DIAGNOSIS — I341 Nonrheumatic mitral (valve) prolapse: Secondary | ICD-10-CM | POA: Diagnosis not present

## 2018-08-01 DIAGNOSIS — I25119 Atherosclerotic heart disease of native coronary artery with unspecified angina pectoris: Secondary | ICD-10-CM | POA: Diagnosis not present

## 2018-08-02 DIAGNOSIS — I341 Nonrheumatic mitral (valve) prolapse: Secondary | ICD-10-CM | POA: Diagnosis not present

## 2018-08-02 DIAGNOSIS — E782 Mixed hyperlipidemia: Secondary | ICD-10-CM | POA: Diagnosis not present

## 2018-08-02 DIAGNOSIS — I10 Essential (primary) hypertension: Secondary | ICD-10-CM | POA: Diagnosis not present

## 2018-08-02 DIAGNOSIS — I25119 Atherosclerotic heart disease of native coronary artery with unspecified angina pectoris: Secondary | ICD-10-CM | POA: Diagnosis not present

## 2018-08-23 ENCOUNTER — Ambulatory Visit: Admit: 2018-08-23 | Payer: Medicare HMO | Admitting: Unknown Physician Specialty

## 2018-08-23 SURGERY — COLONOSCOPY WITH PROPOFOL
Anesthesia: General

## 2018-09-02 ENCOUNTER — Other Ambulatory Visit: Payer: Self-pay

## 2018-09-03 ENCOUNTER — Ambulatory Visit
Admission: RE | Admit: 2018-09-03 | Discharge: 2018-09-03 | Disposition: A | Discharge status: Home / Self Care | Payer: Medicare HMO | Source: Ambulatory Visit | Attending: Radiation Oncology | Admitting: Radiation Oncology

## 2018-09-03 ENCOUNTER — Encounter: Payer: Self-pay | Admitting: Oncology

## 2018-09-03 ENCOUNTER — Other Ambulatory Visit: Payer: Self-pay

## 2018-09-03 ENCOUNTER — Inpatient Hospital Stay: Payer: Medicare HMO | Attending: Oncology | Admitting: Oncology

## 2018-09-03 VITALS — BP 145/71 | HR 67 | Temp 97.2°F | Resp 20 | Ht 62.0 in | Wt 124.0 lb

## 2018-09-03 DIAGNOSIS — Z800 Family history of malignant neoplasm of digestive organs: Secondary | ICD-10-CM | POA: Insufficient documentation

## 2018-09-03 DIAGNOSIS — Z9221 Personal history of antineoplastic chemotherapy: Secondary | ICD-10-CM

## 2018-09-03 DIAGNOSIS — Z923 Personal history of irradiation: Secondary | ICD-10-CM | POA: Diagnosis not present

## 2018-09-03 DIAGNOSIS — I10 Essential (primary) hypertension: Secondary | ICD-10-CM | POA: Diagnosis not present

## 2018-09-03 DIAGNOSIS — Z853 Personal history of malignant neoplasm of breast: Secondary | ICD-10-CM | POA: Diagnosis not present

## 2018-09-03 DIAGNOSIS — K449 Diaphragmatic hernia without obstruction or gangrene: Secondary | ICD-10-CM | POA: Diagnosis not present

## 2018-09-03 DIAGNOSIS — Z87891 Personal history of nicotine dependence: Secondary | ICD-10-CM | POA: Insufficient documentation

## 2018-09-03 DIAGNOSIS — Z8673 Personal history of transient ischemic attack (TIA), and cerebral infarction without residual deficits: Secondary | ICD-10-CM | POA: Diagnosis not present

## 2018-09-03 DIAGNOSIS — C50312 Malignant neoplasm of lower-inner quadrant of left female breast: Secondary | ICD-10-CM

## 2018-09-03 DIAGNOSIS — Z7982 Long term (current) use of aspirin: Secondary | ICD-10-CM | POA: Diagnosis not present

## 2018-09-03 DIAGNOSIS — Z801 Family history of malignant neoplasm of trachea, bronchus and lung: Secondary | ICD-10-CM | POA: Insufficient documentation

## 2018-09-03 DIAGNOSIS — F419 Anxiety disorder, unspecified: Secondary | ICD-10-CM | POA: Insufficient documentation

## 2018-09-03 DIAGNOSIS — I341 Nonrheumatic mitral (valve) prolapse: Secondary | ICD-10-CM | POA: Diagnosis not present

## 2018-09-03 DIAGNOSIS — Z170 Estrogen receptor positive status [ER+]: Secondary | ICD-10-CM

## 2018-09-03 DIAGNOSIS — Z08 Encounter for follow-up examination after completed treatment for malignant neoplasm: Secondary | ICD-10-CM

## 2018-09-03 DIAGNOSIS — K219 Gastro-esophageal reflux disease without esophagitis: Secondary | ICD-10-CM | POA: Diagnosis not present

## 2018-09-03 DIAGNOSIS — E7800 Pure hypercholesterolemia, unspecified: Secondary | ICD-10-CM | POA: Insufficient documentation

## 2018-09-03 DIAGNOSIS — R42 Dizziness and giddiness: Secondary | ICD-10-CM | POA: Insufficient documentation

## 2018-09-03 DIAGNOSIS — Z79899 Other long term (current) drug therapy: Secondary | ICD-10-CM | POA: Diagnosis not present

## 2018-09-03 DIAGNOSIS — K589 Irritable bowel syndrome without diarrhea: Secondary | ICD-10-CM | POA: Insufficient documentation

## 2018-09-03 NOTE — Progress Notes (Signed)
Hematology/Oncology Consult note [facility]  [phone] [phone]  Patient Care Team: Rusty Aus, MD as PCP - General (Internal Medicine) Sindy Guadeloupe, MD as Consulting Physician (Oncology) Herbert Pun, MD as Consulting Physician (General Surgery) Rico Junker, RN as Oncology Nurse Navigator Noreene Filbert, MD as Referring Physician (Radiation Oncology)   Name of the patient: Nalanie Winiecki  [id number]  [birth year]   Date of visit: 09/03/18  Diagnosis- Stage IA invasive mammary carcinoma of the left breast ER negative, PR weakly positive and HER-2 positive  Chief complaint/ Reason for visit-routine follow-up of breast cancer  Heme/Onc history: Patient is a 78 year old female who was diagnosed with invasive mammary carcinoma of the left breast ER negative, PR 11 to 50% positive and HER-2/neu positive. Tumor was grade 314 mm. Lymph nodes were negative for malignancy. She started adjuvant Taxol Herceptin chemotherapy in October 2018 and has completed 1 year of adjuvant Herceptin. She is also completed adjuvant radiation. She could not tolerate AI and did not wish to continue hormone therapy.   Interval history-overall she is doing well and denies any complaints today.  Her appetite is good and her weight has remained stable.  Her headaches have gotten better with Claritin.  Denies any new aches or pains anywhere.  ECOG PS- 0 Pain scale- 0 Opioid associated constipation- no  Review of systems- Review of Systems  Constitutional: Negative for chills, fever, malaise/fatigue and weight loss.  HENT: Negative for congestion, ear discharge and nosebleeds.   Eyes: Negative for blurred vision.  Respiratory: Negative for cough, hemoptysis, sputum production, shortness of breath and wheezing.   Cardiovascular: Negative for chest pain, palpitations, orthopnea and claudication.  Gastrointestinal: Negative for abdominal  pain, blood in stool, constipation, diarrhea, heartburn, melena, nausea and vomiting.  Genitourinary: Negative for dysuria, flank pain, frequency, hematuria and urgency.  Musculoskeletal: Negative for back pain, joint pain and myalgias.  Skin: Negative for rash.  Neurological: Negative for dizziness, tingling, focal weakness, seizures, weakness and headaches.  Endo/Heme/Allergies: Does not bruise/bleed easily.  Psychiatric/Behavioral: Negative for depression and suicidal ideas. The patient does not have insomnia.        Allergies  Allergen Reactions  . Imipramine Pamoate Shortness Of Breath  . Bisphosphonates Nausea Only  . Epinephrine Other (See Comments)    Difficulty breathing  . Prednisone Palpitations  . Sulfa Antibiotics Rash    Face turns red and stings     Past Medical History:  Diagnosis Date  . Anemia   . Anxiety   . Asthma   . Breast cancer ([place]) 12/14/2016   left breast  . Cancer ([place]) skin  . Chronic vulvitis   . Colon polyp   . Cystocele   . Diverticulitis   . Diverticulitis   . Diverticulosis   . Diverticulosis   . Dyspareunia, female   . Dyspnea   . Dysrhythmia   . Fibrocystic disease of both breasts   . Gastritis   . GERD (gastroesophageal reflux disease)    gastritis also  . Hemorrhoids   . History of hiatal hernia   . Hypercholesteremia   . Hypertension   . IBS (irritable bowel syndrome)   . IC (interstitial cystitis)   . Migraine   . Mitral valve prolapse   . OP (osteoporosis)   . Personal history of chemotherapy   . Personal history of radiation therapy   . Positional vertigo   . Rectocele   . Squamous cell carcinoma   .  Stroke [facility])    TIA  . Vaginal atrophy      Past Surgical History:  Procedure Laterality Date  . APPENDECTOMY    . BREAST BIOPSY Left 1998   core bx- neg  . BREAST BIOPSY Left 2007   neg  . BREAST BIOPSY Left 12/14/2016   left breast US core positive  . BREAST EXCISIONAL BIOPSY Left   . BREAST LUMPECTOMY  Left 01/01/2017    INVASIVE MAMMARY CARCINOMA/Grade 3  . CARDIAC CATHETERIZATION N/A 01/18/2015   Procedure: Left Heart Cath and Coronary Angiography;  Surgeon: Teodoro Spray, MD;  Location: [place] CV LAB;  Service: Cardiovascular;  Laterality: N/A;  . CATARACT EXTRACTION W/PHACO Right 12/15/2015   Procedure: CATARACT EXTRACTION PHACO AND INTRAOCULAR LENS PLACEMENT (IOC);  Surgeon: Estill Cotta, MD;  Location: [facility] ORS;  Service: Ophthalmology;  Laterality: Right;  [place] 01:20 AP% 23.9 CDE 35.49 Fluid pack lot # [id number]  . CATARACT EXTRACTION W/PHACO Left 12/29/2015   Procedure: CATARACT EXTRACTION PHACO AND INTRAOCULAR LENS PLACEMENT ([place]);  Surgeon: Estill Cotta, MD;  Location: [facility] ORS;  Service: Ophthalmology;  Laterality: Left;  [place]  01:20 AP% 25.1 CDE 32.86 Fluid pack lot # [id number]  . COLON SURGERY    . COLONOSCOPY WITH PROPOFOL N/A 02/21/2016   Procedure: COLONOSCOPY WITH PROPOFOL;  Surgeon: Manya Silvas, MD;  Location: [facility] ENDOSCOPY;  Service: Endoscopy;  Laterality: N/A;  . CORONARY ANGIOPLASTY    . DILATION AND CURETTAGE OF UTERUS    . ESOPHAGOGASTRODUODENOSCOPY (EGD) WITH PROPOFOL N/A 11/23/2017   Procedure: ESOPHAGOGASTRODUODENOSCOPY (EGD) WITH PROPOFOL;  Surgeon: Manya Silvas, MD;  Location: [facility] ENDOSCOPY;  Service: Endoscopy;  Laterality: N/A;  . OOPHORECTOMY Bilateral   . PARTIAL MASTECTOMY WITH NEEDLE LOCALIZATION Left 01/01/2017   Procedure: PARTIAL MASTECTOMY WITH NEEDLE LOCALIZATION;  Surgeon: Herbert Pun, MD;  Location: [facility] ORS;  Service: General;  Laterality: Left;  . PORTACATH PLACEMENT Right 01/01/2017   Procedure: INSERTION PORT-A-CATH;  Surgeon: Herbert Pun, MD;  Location: [facility] ORS;  Service: General;  Laterality: Right;  . SENTINEL NODE BIOPSY Left 01/01/2017   Procedure: SENTINEL NODE BIOPSY;  Surgeon: Herbert Pun, MD;  Location: [facility] ORS;  Service: General;  Laterality: Left;  . TONSILLECTOMY    . VAGINAL  HYSTERECTOMY      Social History   Socioeconomic History  . Marital status: Married    Spouse name: Not on file  . Number of children: Not on file  . Years of education: Not on file  . Highest education level: Not on file  Occupational History  . Not on file  Social Needs  . Financial resource strain: Not on file  . Food insecurity:    Worry: Not on file    Inability: Not on file  . Transportation needs:    Medical: Not on file    Non-medical: Not on file  Tobacco Use  . Smoking status: Former Smoker    Packs/day: 1.00    Years: 33.00    Pack years: 33.00    Last attempt to quit: 10/17/1987    Years since quitting: 30.9  . Smokeless tobacco: Never Used  Substance and Sexual Activity  . Alcohol use: No  . Drug use: No  . Sexual activity: Yes  Lifestyle  . Physical activity:    Days per week: Not on file    Minutes per session: Not on file  . Stress: Not on file  Relationships  . Social connections:    Talks on phone: Not on  file    Gets together: Not on file    Attends religious service: Not on file    Active member of club or organization: Not on file    Attends meetings of clubs or organizations: Not on file    Relationship status: Not on file  . Intimate partner violence:    Fear of current or ex partner: Not on file    Emotionally abused: Not on file    Physically abused: Not on file    Forced sexual activity: Not on file  Other Topics Concern  . Not on file  Social History Narrative  . Not on file    Family History  Problem Relation Age of Onset  . Diabetes Mother   . Diabetes Father   . Colon cancer Sister   . Diabetes Sister   . Diabetes Maternal Aunt   . Colon cancer Paternal Grandfather   . Lung cancer Brother   . Breast cancer Neg Hx   . Ovarian cancer Neg Hx      Current Outpatient Medications:  .  acetaminophen (TYLENOL) 325 MG tablet, Take 650 mg by mouth every 6 (six) hours as needed (for headaches.). , Disp: , Rfl:  .  ALPRAZolam  (XANAX) 0.25 MG tablet, Take 0.25 mg by mouth at bedtime as needed for anxiety or sleep. , Disp: , Rfl:  .  aspirin EC 325 MG tablet, Take 325 mg by mouth daily with breakfast. , Disp: , Rfl:  .  atenolol (TENORMIN) 50 MG tablet, Take 50 mg by mouth daily with breakfast. , Disp: , Rfl:  .  calcium citrate-vitamin D (CITRACAL+D) 315-200 MG-UNIT tablet, Take 1 tablet by mouth 2 (two) times daily., Disp: , Rfl:  .  FLUoxetine (PROZAC) 40 MG capsule, Take 40 mg by mouth at bedtime., Disp: , Rfl:  .  hydrochlorothiazide (HYDRODIURIL) 25 MG tablet, Take 25 mg by mouth daily. , Disp: , Rfl:  .  lansoprazole (PREVACID) 30 MG capsule, Take 30 mg by mouth daily before breakfast. , Disp: , Rfl:  .  loratadine (CLARITIN) 10 MG tablet, Take 10 mg by mouth daily as needed for allergies. , Disp: , Rfl:  .  Magnesium 250 MG TABS, Take 250 mg by mouth daily., Disp: , Rfl:  .  Multiple Vitamins-Minerals (PRESERVISION AREDS 2) CAPS, Take 1 capsule by mouth 2 (two) times daily. , Disp: , Rfl:  .  polyethylene glycol (MIRALAX / GLYCOLAX) packet, Take 17 g by mouth daily as needed. , Disp: , Rfl:  .  simvastatin (ZOCOR) 20 MG tablet, Take 20 mg by mouth daily at 8 pm., Disp: , Rfl:  .  telmisartan (MICARDIS) 80 MG tablet, Take 40 mg by mouth daily with breakfast., Disp: , Rfl:  .  fluticasone (FLONASE) 50 MCG/ACT nasal spray, Place 2 sprays into both nostrils daily as needed for allergies. , Disp: , Rfl:   Physical exam:  Vitals:   09/03/18 1303  BP: (!) 145/71  Pulse: 67  Resp: 20  Temp: (!) 97.2 F (36.2 C)  TempSrc: Tympanic  Weight: 124 lb (56.2 kg)  Height: _0  (1.575 m)   Physical Exam HENT:     Head: Normocephalic and atraumatic.  Eyes:     Pupils: Pupils are equal, round, and reactive to light.  Neck:     Musculoskeletal: Normal range of motion.  Cardiovascular:     Rate and Rhythm: Normal rate and regular rhythm.     Heart sounds: Normal heart sounds.  Pulmonary:     Effort: Pulmonary  effort is normal.     Breath sounds: Normal breath sounds.  Abdominal:     General: Bowel sounds are normal.     Palpations: Abdomen is soft.  Skin:    General: Skin is warm and dry.  Neurological:     Mental Status: She is alert and oriented to person, place, and time.     Breast exam was performed in seated and lying down position. Patient is status post left lumpectomy with a well-healed surgical scar. No evidence of any palpable masses. No evidence of axillary adenopathy. No evidence of any palpable masses or lumps in the right breast. No evidence of right axillary adenopathy  CMP Latest Ref Rng & Units 01/22/2018  Glucose 70 - 99 mg/dL 127(H)  BUN 8 - 23 mg/dL 11  Creatinine 0.44 - 1.00 mg/dL 0.59  Sodium 135 - 145 mmol/L 132(L)  Potassium 3.5 - 5.1 mmol/L 3.9  Chloride 98 - 111 mmol/L 97(L)  CO2 22 - 32 mmol/L 30  Calcium 8.9 - 10.3 mg/dL 9.1  Total Protein 6.5 - 8.1 g/dL 6.6  Total Bilirubin 0.3 - 1.2 mg/dL 0.5  Alkaline Phos 38 - 126 U/L 72  AST 15 - 41 U/L 23  ALT 0 - 44 U/L 18   CBC Latest Ref Rng & Units 01/22/2018  WBC 4.0 - 10.5 K/uL 4.7  Hemoglobin 12.0 - 15.0 g/dL 10.7(L)  Hematocrit 36.0 - 46.0 % 33.4(L)  Platelets 150 - 400 K/uL 182     Assessment and plan- Patient is a 78 y.o. female pathologicalprognostic stage IA pT1c1p N0 cM0invasive mammary carcinoma of the left breast ER negative PR positive and HER-2 +3 on IHC s/p lumpectomy s/p adjuvant chemotherapy.She has also completed 1 year of adjuvant Herceptin.   This is a routine follow-up visit for breast cancer  Clinically patient is doing well and there is no concern for recurrence based on signs and symptoms.  She is due for a mammogram in October 2020 which will be arranged by Dr. Ammie Ferrier office.  Recent labs from March 2020 reviewed and were within normal limits.  I will see her back in 6 months no labs   Visit Diagnosis 1. Encounter for follow-up surveillance of breast cancer      Dr. Randa Evens, MD, MPH [facility] at [facility] [phone] 09/03/2018 1:56 PM

## 2018-09-03 NOTE — Progress Notes (Signed)
Radiation Oncology Follow up Note  Name: Doris Barnes   Date:   09/03/2018 MRN:  [id number] DOB: 09/15/1940    This 78 y.o. female presents to the clinic today for 1 year follow-up status post whole breast radiation to her left breast for stage I ER negative PR weakly positive HER-2/neu overexpressed invasive mammary carcinoma of the left breast.  REFERRING PROVIDER: Rusty Aus, MD  HPI: Patient is a 78 year old female now at 1 year having completed whole breast radiation to her left breast for ER PR negative HER-2/neu positive invasive mammary carcinoma.  She is seen today in routine follow-up is doing well.  She specifically denies breast tenderness cough or bone pain..  She had a mammogram back in October which I have reviewed was showing no evidence of malignancy.  She is not on antiestrogen therapy.  COMPLICATIONS OF TREATMENT: none  FOLLOW UP COMPLIANCE: keeps appointments   PHYSICAL EXAM:  There were no vitals taken for this visit. Well-developed well-nourished patient in NAD. HEENT reveals PERLA, EOMI, discs not visualized.  Oral cavity is clear. No oral mucosal lesions are identified. Neck is clear without evidence of cervical or supraclavicular adenopathy. Lungs are clear to A&P. Cardiac examination is essentially unremarkable with regular rate and rhythm without murmur rub or thrill. Abdomen is benign with no organomegaly or masses noted. Motor sensory and DTR levels are equal and symmetric in the upper and lower extremities. Cranial nerves II through XII are grossly intact. Proprioception is intact. No peripheral adenopathy or edema is identified. No motor or sensory levels are noted. Crude visual fields are within normal range.  RADIOLOGY RESULTS: Mammogram reviewed compatible with above-stated findings  PLAN: Present time she continues to do well with no evidence of disease.  I am pleased with her overall progress.  Of asked to see her out in 1 year for follow-up.  She  continues close follow-up care with medical oncology.  Patient knows to call with any concerns.  I would like to take this opportunity to thank you for allowing me to participate in the care of your patient.Noreene Filbert, MD

## 2018-09-26 DIAGNOSIS — S39012A Strain of muscle, fascia and tendon of lower back, initial encounter: Secondary | ICD-10-CM | POA: Diagnosis not present

## 2018-09-30 DIAGNOSIS — M5116 Intervertebral disc disorders with radiculopathy, lumbar region: Secondary | ICD-10-CM | POA: Diagnosis not present

## 2018-10-22 DIAGNOSIS — D225 Melanocytic nevi of trunk: Secondary | ICD-10-CM | POA: Diagnosis not present

## 2018-10-22 DIAGNOSIS — Z85828 Personal history of other malignant neoplasm of skin: Secondary | ICD-10-CM | POA: Diagnosis not present

## 2018-10-22 DIAGNOSIS — L821 Other seborrheic keratosis: Secondary | ICD-10-CM | POA: Diagnosis not present

## 2018-10-22 DIAGNOSIS — Z08 Encounter for follow-up examination after completed treatment for malignant neoplasm: Secondary | ICD-10-CM | POA: Diagnosis not present

## 2018-10-22 DIAGNOSIS — D2262 Melanocytic nevi of left upper limb, including shoulder: Secondary | ICD-10-CM | POA: Diagnosis not present

## 2018-10-22 DIAGNOSIS — D2272 Melanocytic nevi of left lower limb, including hip: Secondary | ICD-10-CM | POA: Diagnosis not present

## 2018-10-22 DIAGNOSIS — D2261 Melanocytic nevi of right upper limb, including shoulder: Secondary | ICD-10-CM | POA: Diagnosis not present

## 2018-10-22 DIAGNOSIS — D2271 Melanocytic nevi of right lower limb, including hip: Secondary | ICD-10-CM | POA: Diagnosis not present

## 2018-10-24 DIAGNOSIS — M542 Cervicalgia: Secondary | ICD-10-CM | POA: Diagnosis not present

## 2018-10-24 DIAGNOSIS — G43001 Migraine without aura, not intractable, with status migrainosus: Secondary | ICD-10-CM | POA: Diagnosis not present

## 2018-10-25 DIAGNOSIS — D485 Neoplasm of uncertain behavior of skin: Secondary | ICD-10-CM | POA: Diagnosis not present

## 2018-10-25 DIAGNOSIS — L570 Actinic keratosis: Secondary | ICD-10-CM | POA: Diagnosis not present

## 2018-11-01 DIAGNOSIS — Z20828 Contact with and (suspected) exposure to other viral communicable diseases: Secondary | ICD-10-CM | POA: Diagnosis not present

## 2018-11-12 DIAGNOSIS — H40003 Preglaucoma, unspecified, bilateral: Secondary | ICD-10-CM | POA: Diagnosis not present

## 2018-11-22 DIAGNOSIS — L570 Actinic keratosis: Secondary | ICD-10-CM | POA: Diagnosis not present

## 2018-11-22 DIAGNOSIS — X32XXXA Exposure to sunlight, initial encounter: Secondary | ICD-10-CM | POA: Diagnosis not present

## 2018-12-09 DIAGNOSIS — E782 Mixed hyperlipidemia: Secondary | ICD-10-CM | POA: Diagnosis not present

## 2018-12-09 DIAGNOSIS — M810 Age-related osteoporosis without current pathological fracture: Secondary | ICD-10-CM | POA: Diagnosis not present

## 2018-12-16 DIAGNOSIS — F419 Anxiety disorder, unspecified: Secondary | ICD-10-CM | POA: Diagnosis not present

## 2018-12-16 DIAGNOSIS — K5900 Constipation, unspecified: Secondary | ICD-10-CM | POA: Diagnosis not present

## 2018-12-16 DIAGNOSIS — E782 Mixed hyperlipidemia: Secondary | ICD-10-CM | POA: Diagnosis not present

## 2018-12-16 DIAGNOSIS — F329 Major depressive disorder, single episode, unspecified: Secondary | ICD-10-CM | POA: Diagnosis not present

## 2018-12-16 DIAGNOSIS — K219 Gastro-esophageal reflux disease without esophagitis: Secondary | ICD-10-CM | POA: Diagnosis not present

## 2018-12-16 DIAGNOSIS — Z0000 Encounter for general adult medical examination without abnormal findings: Secondary | ICD-10-CM | POA: Diagnosis not present

## 2018-12-16 DIAGNOSIS — I10 Essential (primary) hypertension: Secondary | ICD-10-CM | POA: Diagnosis not present

## 2018-12-23 ENCOUNTER — Other Ambulatory Visit: Payer: Self-pay | Admitting: Internal Medicine

## 2018-12-23 DIAGNOSIS — C50312 Malignant neoplasm of lower-inner quadrant of left female breast: Secondary | ICD-10-CM

## 2018-12-27 DIAGNOSIS — R062 Wheezing: Secondary | ICD-10-CM | POA: Diagnosis not present

## 2018-12-27 DIAGNOSIS — R05 Cough: Secondary | ICD-10-CM | POA: Diagnosis not present

## 2018-12-27 DIAGNOSIS — J40 Bronchitis, not specified as acute or chronic: Secondary | ICD-10-CM | POA: Diagnosis not present

## 2018-12-27 DIAGNOSIS — Z03818 Encounter for observation for suspected exposure to other biological agents ruled out: Secondary | ICD-10-CM | POA: Diagnosis not present

## 2018-12-31 ENCOUNTER — Other Ambulatory Visit: Admission: RE | Admit: 2018-12-31 | Payer: Medicare HMO | Source: Ambulatory Visit

## 2019-01-03 ENCOUNTER — Other Ambulatory Visit: Payer: Self-pay

## 2019-01-03 ENCOUNTER — Encounter
Admission: RE | Disposition: A | Discharge status: Home / Self Care | Payer: Self-pay | Source: Home / Self Care | Attending: Gastroenterology

## 2019-01-03 ENCOUNTER — Encounter: Payer: Self-pay | Admitting: *Deleted

## 2019-01-03 ENCOUNTER — Ambulatory Visit: Payer: Medicare HMO | Admitting: Anesthesiology

## 2019-01-03 ENCOUNTER — Ambulatory Visit
Admission: RE | Admit: 2019-01-03 | Discharge: 2019-01-03 | Disposition: A | Discharge status: Home / Self Care | Payer: Medicare HMO | Attending: Gastroenterology | Admitting: Gastroenterology

## 2019-01-03 DIAGNOSIS — Z1211 Encounter for screening for malignant neoplasm of colon: Secondary | ICD-10-CM | POA: Diagnosis not present

## 2019-01-03 DIAGNOSIS — G43909 Migraine, unspecified, not intractable, without status migrainosus: Secondary | ICD-10-CM | POA: Diagnosis not present

## 2019-01-03 DIAGNOSIS — Z923 Personal history of irradiation: Secondary | ICD-10-CM | POA: Insufficient documentation

## 2019-01-03 DIAGNOSIS — I10 Essential (primary) hypertension: Secondary | ICD-10-CM | POA: Insufficient documentation

## 2019-01-03 DIAGNOSIS — K219 Gastro-esophageal reflux disease without esophagitis: Secondary | ICD-10-CM | POA: Diagnosis not present

## 2019-01-03 DIAGNOSIS — N3010 Interstitial cystitis (chronic) without hematuria: Secondary | ICD-10-CM | POA: Insufficient documentation

## 2019-01-03 DIAGNOSIS — D124 Benign neoplasm of descending colon: Secondary | ICD-10-CM | POA: Insufficient documentation

## 2019-01-03 DIAGNOSIS — Z79899 Other long term (current) drug therapy: Secondary | ICD-10-CM | POA: Diagnosis not present

## 2019-01-03 DIAGNOSIS — I341 Nonrheumatic mitral (valve) prolapse: Secondary | ICD-10-CM | POA: Diagnosis not present

## 2019-01-03 DIAGNOSIS — K449 Diaphragmatic hernia without obstruction or gangrene: Secondary | ICD-10-CM | POA: Insufficient documentation

## 2019-01-03 DIAGNOSIS — K640 First degree hemorrhoids: Secondary | ICD-10-CM | POA: Insufficient documentation

## 2019-01-03 DIAGNOSIS — Z8673 Personal history of transient ischemic attack (TIA), and cerebral infarction without residual deficits: Secondary | ICD-10-CM | POA: Insufficient documentation

## 2019-01-03 DIAGNOSIS — K5790 Diverticulosis of intestine, part unspecified, without perforation or abscess without bleeding: Secondary | ICD-10-CM | POA: Diagnosis not present

## 2019-01-03 DIAGNOSIS — F419 Anxiety disorder, unspecified: Secondary | ICD-10-CM | POA: Diagnosis not present

## 2019-01-03 DIAGNOSIS — K648 Other hemorrhoids: Secondary | ICD-10-CM | POA: Diagnosis not present

## 2019-01-03 DIAGNOSIS — K5730 Diverticulosis of large intestine without perforation or abscess without bleeding: Secondary | ICD-10-CM | POA: Insufficient documentation

## 2019-01-03 DIAGNOSIS — Z7982 Long term (current) use of aspirin: Secondary | ICD-10-CM | POA: Diagnosis not present

## 2019-01-03 DIAGNOSIS — M810 Age-related osteoporosis without current pathological fracture: Secondary | ICD-10-CM | POA: Diagnosis not present

## 2019-01-03 DIAGNOSIS — J45909 Unspecified asthma, uncomplicated: Secondary | ICD-10-CM | POA: Diagnosis not present

## 2019-01-03 DIAGNOSIS — K635 Polyp of colon: Secondary | ICD-10-CM | POA: Diagnosis not present

## 2019-01-03 DIAGNOSIS — Z9221 Personal history of antineoplastic chemotherapy: Secondary | ICD-10-CM | POA: Diagnosis not present

## 2019-01-03 DIAGNOSIS — Z853 Personal history of malignant neoplasm of breast: Secondary | ICD-10-CM | POA: Insufficient documentation

## 2019-01-03 DIAGNOSIS — N6011 Diffuse cystic mastopathy of right breast: Secondary | ICD-10-CM | POA: Insufficient documentation

## 2019-01-03 DIAGNOSIS — N6012 Diffuse cystic mastopathy of left breast: Secondary | ICD-10-CM | POA: Diagnosis not present

## 2019-01-03 DIAGNOSIS — E7800 Pure hypercholesterolemia, unspecified: Secondary | ICD-10-CM | POA: Insufficient documentation

## 2019-01-03 DIAGNOSIS — H8110 Benign paroxysmal vertigo, unspecified ear: Secondary | ICD-10-CM | POA: Insufficient documentation

## 2019-01-03 DIAGNOSIS — Z888 Allergy status to other drugs, medicaments and biological substances status: Secondary | ICD-10-CM | POA: Insufficient documentation

## 2019-01-03 DIAGNOSIS — K589 Irritable bowel syndrome without diarrhea: Secondary | ICD-10-CM | POA: Diagnosis not present

## 2019-01-03 DIAGNOSIS — Z86010 Personal history of colonic polyps: Secondary | ICD-10-CM | POA: Diagnosis not present

## 2019-01-03 DIAGNOSIS — Z882 Allergy status to sulfonamides status: Secondary | ICD-10-CM | POA: Insufficient documentation

## 2019-01-03 DIAGNOSIS — E785 Hyperlipidemia, unspecified: Secondary | ICD-10-CM | POA: Diagnosis not present

## 2019-01-03 HISTORY — PX: COLONOSCOPY WITH PROPOFOL: SHX5780

## 2019-01-03 SURGERY — COLONOSCOPY WITH PROPOFOL
Anesthesia: General

## 2019-01-03 MED ORDER — LIDOCAINE HCL (PF) 2 % IJ SOLN
INTRAMUSCULAR | Status: AC
  Filled 2019-01-03: qty 10

## 2019-01-03 MED ORDER — FENTANYL CITRATE (PF) 100 MCG/2ML IJ SOLN
INTRAMUSCULAR | Status: AC
  Filled 2019-01-03: qty 2

## 2019-01-03 MED ORDER — SODIUM CHLORIDE 0.9 % IV SOLN
INTRAVENOUS | Status: DC
  Administered 2019-01-03: 12:00:00 via INTRAVENOUS

## 2019-01-03 MED ORDER — PROPOFOL 10 MG/ML IV BOLUS
INTRAVENOUS | Status: DC | PRN
  Administered 2019-01-03 (×2): 10 mg via INTRAVENOUS
  Administered 2019-01-03: 30 mg via INTRAVENOUS

## 2019-01-03 MED ORDER — PHENYLEPHRINE HCL (PRESSORS) 10 MG/ML IV SOLN
INTRAVENOUS | Status: DC | PRN
  Administered 2019-01-03 (×3): 100 ug via INTRAVENOUS

## 2019-01-03 MED ORDER — FENTANYL CITRATE (PF) 100 MCG/2ML IJ SOLN
INTRAMUSCULAR | Status: DC | PRN
  Administered 2019-01-03: 50 ug via INTRAVENOUS
  Administered 2019-01-03 (×2): 25 ug via INTRAVENOUS

## 2019-01-03 MED ORDER — PROPOFOL 500 MG/50ML IV EMUL
INTRAVENOUS | Status: DC | PRN
  Administered 2019-01-03: 50 ug/kg/min via INTRAVENOUS

## 2019-01-03 MED ORDER — LIDOCAINE HCL (PF) 2 % IJ SOLN
INTRAMUSCULAR | Status: DC | PRN
  Administered 2019-01-03: 60 mg

## 2019-01-03 MED ORDER — PROPOFOL 500 MG/50ML IV EMUL
INTRAVENOUS | Status: AC
  Filled 2019-01-03: qty 50

## 2019-01-03 NOTE — Transfer of Care (Signed)
Immediate Anesthesia Transfer of Care Note  Patient: Doris Barnes  Procedure(s) Performed: COLONOSCOPY WITH PROPOFOL (N/A )  Patient Location: PACU  Anesthesia Type:General  Level of Consciousness: sedated  Airway & Oxygen Therapy: Patient Spontanous Breathing and Patient connected to nasal cannula oxygen  Post-op Assessment: Report given to RN and Post -op Vital signs reviewed and stable  Post vital signs: Reviewed and stable  Last Vitals:  Vitals Value Taken Time  BP 139/70 01/03/19 1329  Temp 36.2 C 01/03/19 1326  Pulse 52 01/03/19 1330  Resp 16 01/03/19 1330  SpO2 97 % 01/03/19 1330  Vitals shown include unvalidated device data.  Last Pain:  Vitals:   01/03/19 1326  TempSrc: Tympanic  PainSc: Asleep         Complications: No apparent anesthesia complications

## 2019-01-03 NOTE — Anesthesia Postprocedure Evaluation (Signed)
Anesthesia Post Note  Patient: EVALINE KOZLOSKI  Procedure(s) Performed: COLONOSCOPY WITH PROPOFOL (N/A )  Patient location during evaluation: Endoscopy Anesthesia Type: General Level of consciousness: awake and alert Pain management: pain level controlled Vital Signs Assessment: post-procedure vital signs reviewed and stable Respiratory status: spontaneous breathing, nonlabored ventilation, respiratory function stable and patient connected to nasal cannula oxygen Cardiovascular status: blood pressure returned to baseline and stable Postop Assessment: no apparent nausea or vomiting Anesthetic complications: no     Last Vitals:  Vitals:   01/03/19 1346 01/03/19 1356  BP: (!) 148/82 (!) 173/88  Pulse: 71 68  Resp: 16 (!) 22  Temp:    SpO2: 96% 97%    Last Pain:  Vitals:   01/03/19 1356  TempSrc:   PainSc: 0-No pain                 Precious Haws Piscitello

## 2019-01-03 NOTE — Anesthesia Preprocedure Evaluation (Signed)
Anesthesia Evaluation  Patient identified by MRN, date of birth, ID band Patient awake    Reviewed: Allergy & Precautions, H&P , NPO status , Patient's Chart, lab work & pertinent test results, reviewed documented beta blocker date and time   Airway Mallampati: II   Neck ROM: full    Dental  (+) Poor Dentition   Pulmonary shortness of breath, asthma , Patient abstained from smoking., former smoker,    Pulmonary exam normal        Cardiovascular Exercise Tolerance: Poor hypertension, On Medications Normal cardiovascular exam+ dysrhythmias  Rhythm:regular Rate:Normal     Neuro/Psych  Headaches, Anxiety TIACVA, No Residual Symptoms negative psych ROS   GI/Hepatic Neg liver ROS, hiatal hernia, GERD  Medicated,  Endo/Other  negative endocrine ROS  Renal/GU negative Renal ROS  negative genitourinary   Musculoskeletal   Abdominal   Peds  Hematology  (+) Blood dyscrasia, anemia ,   Anesthesia Other Findings Past Medical History: No date: Anemia No date: Anxiety No date: Asthma 12/14/2016: Breast cancer ([place])     Comment:  left breast skin: Cancer ([place]) No date: Chronic vulvitis No date: Colon polyp No date: Cystocele No date: Diverticulitis No date: Diverticulitis No date: Diverticulosis No date: Diverticulosis No date: Dyspareunia, female No date: Dyspnea No date: Dysrhythmia No date: Fibrocystic disease of both breasts No date: Gastritis No date: GERD (gastroesophageal reflux disease)     Comment:  gastritis also No date: Hemorrhoids No date: History of hiatal hernia No date: Hypercholesteremia No date: Hypertension No date: IBS (irritable bowel syndrome) No date: IC (interstitial cystitis) No date: Migraine No date: Mitral valve prolapse No date: OP (osteoporosis) No date: Personal history of chemotherapy No date: Personal history of radiation therapy No date: Positional vertigo No date:  Rectocele No date: Squamous cell carcinoma No date: Stroke [facility])     Comment:  TIA No date: Vaginal atrophy Past Surgical History: No date: APPENDECTOMY 1998: BREAST BIOPSY; Left     Comment:  core bx- neg 2007: BREAST BIOPSY; Left     Comment:  neg 12/14/2016: BREAST BIOPSY; Left     Comment:  left breast US core positive No date: BREAST EXCISIONAL BIOPSY; Left 01/01/2017: BREAST LUMPECTOMY; Left     Comment:   INVASIVE MAMMARY CARCINOMA/Grade 3 01/18/2015: CARDIAC CATHETERIZATION; N/A     Comment:  Procedure: Left Heart Cath and Coronary Angiography;                Surgeon: Teodoro Spray, MD;  Location: [place] CV               LAB;  Service: Cardiovascular;  Laterality: N/A; 12/15/2015: CATARACT EXTRACTION W/PHACO; Right     Comment:  Procedure: CATARACT EXTRACTION PHACO AND INTRAOCULAR               LENS PLACEMENT ([place]);  Surgeon: Estill Cotta, MD;                Location: [facility] ORS;  Service: Ophthalmology;  Laterality:              Right;  [place] 01:20 AP% 23.9 CDE 35.49 Fluid pack lot #               [id number] 12/29/2015: CATARACT EXTRACTION W/PHACO; Left     Comment:  Procedure: CATARACT EXTRACTION PHACO AND INTRAOCULAR               LENS PLACEMENT ([facility]);  Surgeon: Estill Cotta, MD;  Location: [facility] ORS;  Service: Ophthalmology;  Laterality:              Left;  [place]  01:20 AP% 25.1 CDE 32.86 Fluid pack lot #               [id number] No date: COLON SURGERY 02/21/2016: COLONOSCOPY WITH PROPOFOL; N/A     Comment:  Procedure: COLONOSCOPY WITH PROPOFOL;  Surgeon: Manya Silvas, MD;  Location: [facility] ENDOSCOPY;  Service:               Endoscopy;  Laterality: N/A; No date: CORONARY ANGIOPLASTY No date: DILATION AND CURETTAGE OF UTERUS 11/23/2017: ESOPHAGOGASTRODUODENOSCOPY (EGD) WITH PROPOFOL; N/A     Comment:  Procedure: ESOPHAGOGASTRODUODENOSCOPY (EGD) WITH               PROPOFOL;  Surgeon: Manya Silvas, MD;  Location:                [facility] ENDOSCOPY;  Service: Endoscopy;  Laterality: N/A; No date: OOPHORECTOMY; Bilateral 01/01/2017: PARTIAL MASTECTOMY WITH NEEDLE LOCALIZATION; Left     Comment:  Procedure: PARTIAL MASTECTOMY WITH NEEDLE LOCALIZATION;               Surgeon: Herbert Pun, MD;  Location: [facility] ORS;               Service: General;  Laterality: Left; 01/01/2017: PORTACATH PLACEMENT; Right     Comment:  Procedure: INSERTION PORT-A-CATH;  Surgeon:               Herbert Pun, MD;  Location: [facility] ORS;  Service:              General;  Laterality: Right; 01/01/2017: SENTINEL NODE BIOPSY; Left     Comment:  Procedure: SENTINEL NODE BIOPSY;  Surgeon: Herbert Pun, MD;  Location: [facility] ORS;  Service: General;                Laterality: Left; No date: TONSILLECTOMY No date: VAGINAL HYSTERECTOMY BMI    Body Mass Index: 22.50 kg/m     Reproductive/Obstetrics negative OB ROS                             Anesthesia Physical Anesthesia Plan  ASA: III  Anesthesia Plan: General   Post-op Pain Management:    Induction:   PONV Risk Score and Plan:   Airway Management Planned:   Additional Equipment:   Intra-op Plan:   Post-operative Plan:   Informed Consent: I have reviewed the patients History and Physical, chart, labs and discussed the procedure including the risks, benefits and alternatives for the proposed anesthesia with the patient or authorized representative who has indicated his/her understanding and acceptance.     Dental Advisory Given  Plan Discussed with: CRNA  Anesthesia Plan Comments:         Anesthesia Quick Evaluation

## 2019-01-03 NOTE — Anesthesia Post-op Follow-up Note (Signed)
Anesthesia QCDR form completed.        

## 2019-01-03 NOTE — H&P (Addendum)
Outpatient short stay form Pre-procedure 01/03/2019 12:42 PM Lollie Sails MD  Primary Physician: Dr. Emily Filbert  Reason for visit: Colonoscopy  History of present illness: Patient is a 78 year old female presenting today for colonoscopy in regards to her personal history of adenomatous colon polyps.  Tolerated her prep well.  She takes 325 mg aspirin that is been held for 6 days.  She takes no other aspirin product or blood thinner agent.    Current Facility-Administered Medications:  .  0.9 %  sodium chloride infusion, , Intravenous, Continuous, Lollie Sails, MD, Last Rate: 20 mL/hr at 01/03/19 1205  Medications Prior to Admission  Medication Sig Dispense Refill Last Dose  . acetaminophen (TYLENOL) 325 MG tablet Take 650 mg by mouth every 6 (six) hours as needed (for headaches.).    Past Week at Unknown time  . ALPRAZolam (XANAX) 0.25 MG tablet Take 0.25 mg by mouth at bedtime as needed for anxiety or sleep.    01/02/2019 at 2200  . aspirin EC 325 MG tablet Take 325 mg by mouth daily with breakfast.    12/27/2018 at Unknown time  . atenolol (TENORMIN) 50 MG tablet Take 50 mg by mouth daily with breakfast.    01/03/2019 at 0800  . calcium citrate-vitamin D (CITRACAL+D) 315-200 MG-UNIT tablet Take 1 tablet by mouth 2 (two) times daily.   01/02/2019 at 800  . FLUoxetine (PROZAC) 40 MG capsule Take 40 mg by mouth at bedtime.   01/02/2019 at 2200  . fluticasone (FLONASE) 50 MCG/ACT nasal spray Place 2 sprays into both nostrils daily as needed for allergies.    Past Week at Unknown time  . hydrochlorothiazide (HYDRODIURIL) 25 MG tablet Take 25 mg by mouth daily.    01/03/2019 at 0700  . lansoprazole (PREVACID) 30 MG capsule Take 30 mg by mouth daily before breakfast.    01/02/2019 at 2200  . loratadine (CLARITIN) 10 MG tablet Take 10 mg by mouth daily as needed for allergies.    Past Week at Unknown time  . Magnesium 250 MG TABS Take 250 mg by mouth daily.   01/02/2019 at 0800  .  polyethylene glycol (MIRALAX / GLYCOLAX) packet Take 17 g by mouth daily as needed.    Past Month at Unknown time  . simvastatin (ZOCOR) 20 MG tablet Take 20 mg by mouth daily at 8 pm.   01/02/2019 at 2200  . telmisartan (MICARDIS) 80 MG tablet Take 40 mg by mouth daily with breakfast.   01/03/2019 at 0700  . Multiple Vitamins-Minerals (PRESERVISION AREDS 2) CAPS Take 1 capsule by mouth 2 (two) times daily.         Allergies  Allergen Reactions  . Imipramine Pamoate Shortness Of Breath  . Bisphosphonates Nausea Only  . Epinephrine Hcl (Nasal) Other (See Comments)    Difficulty breathing  . Prednisone Palpitations  . Sulfa Antibiotics Rash    Face turns red and stings     Past Medical History:  Diagnosis Date  . Anemia   . Anxiety   . Asthma   . Breast cancer ([place]) 12/14/2016   left breast  . Cancer ([place]) skin  . Chronic vulvitis   . Colon polyp   . Cystocele   . Diverticulitis   . Diverticulitis   . Diverticulosis   . Diverticulosis   . Dyspareunia, female   . Dyspnea   . Dysrhythmia   . Fibrocystic disease of both breasts   . Gastritis   . GERD (gastroesophageal reflux disease)  gastritis also  . Hemorrhoids   . History of hiatal hernia   . Hypercholesteremia   . Hypertension   . IBS (irritable bowel syndrome)   . IC (interstitial cystitis)   . Migraine   . Mitral valve prolapse   . OP (osteoporosis)   . Personal history of chemotherapy   . Personal history of radiation therapy   . Positional vertigo   . Rectocele   . Squamous cell carcinoma   . Stroke [facility])    TIA  . Vaginal atrophy     Review of systems:      Physical Exam    Heart and lungs: Irregular regular rate and rhythm without rub or gallop lungs are bilaterally clear    HEENT: Normocephalic atraumatic eyes are anicteric    Other:    Pertinant exam for procedure: Soft nontender nondistended bowel sounds positive normoactive.  Mild discomfort lower abdomen.    Planned proceedures:  Colonoscopy and indicated procedures. I have discussed the risks benefits and complications of procedures to include not limited to bleeding, infection, perforation and the risk of sedation and the patient wishes to proceed.    Lollie Sails, MD Gastroenterology 01/03/2019  12:42 PM

## 2019-01-03 NOTE — Op Note (Signed)
[facility] Gastroenterology Patient Name: Felicia Newcomb Procedure Date: 01/03/2019 12:43 PM MRN: [id number] Account #: [id number] Date of Birth: [birth year] Admit Type: Outpatient Age: 78 Room: [facility] ENDO ROOM 3 Gender: Female Note Status: Finalized Procedure:            Colonoscopy Indications:          Personal history of colonic polyps Providers:            Lollie Sails, MD Medicines:            Monitored Anesthesia Care Complications:        No immediate complications. Procedure:            Pre-Anesthesia Assessment:                       - ASA Grade Assessment: III - A patient with severe                        systemic disease.                       After obtaining informed consent, the colonoscope was                        passed under direct vision. Throughout the procedure,                        the patient's blood pressure, pulse, and oxygen                        saturations were monitored continuously. The                        Colonoscope was introduced through the anus and                        advanced to the the cecum, identified by appendiceal                        orifice and ileocecal valve. The quality of the bowel                        preparation was good. Findings:      Many small and large-mouthed diverticula were found in the sigmoid colon       and descending colon.      A 3 mm polyp was found in the distal descending colon. The polyp was       sessile. The polyp was removed with a cold biopsy forceps. Resection and       retrieval were complete.      Four sessile polyps were found in the distal sigmoid colon. The polyps       were 1 mm in size. These polyps were removed with a cold biopsy forceps.       Resection and retrieval were complete.      Non-bleeding internal hemorrhoids were found during retroflexion. The       hemorrhoids were small and Grade I (internal hemorrhoids that do not       prolapse).      The digital  rectal exam was normal.      The ileocecal valve was moderately lipomatous. Impression:           -  Diverticulosis in the sigmoid colon and in the                        descending colon.                       - One 3 mm polyp in the distal descending colon,                        removed with a cold biopsy forceps. Resected and                        retrieved.                       - Four 1 mm polyps in the distal sigmoid colon, removed                        with a cold biopsy forceps. Resected and retrieved.                       - Non-bleeding internal hemorrhoids. Recommendation:       - Discharge patient to home.                       - Advance diet as tolerated.                       - Continue present medications.                       - Await pathology results. Procedure Code(s):    --- Professional ---                       [phone], Colonoscopy, flexible; with biopsy, single or                        multiple Diagnosis Code(s):    --- Professional ---                       K64.0, First degree hemorrhoids                       K63.5, Polyp of colon                       Z86.010, Personal history of colonic polyps                       K57.30, Diverticulosis of large intestine without                        perforation or abscess without bleeding CPT copyright 2019 [employer]. All rights reserved. The codes documented in this report are preliminary and upon coder review may  be revised to meet current compliance requirements. Lollie Sails, MD 01/03/2019 1:29:48 PM This report has been signed electronically. Number of Addenda: 0 Note Initiated On: 01/03/2019 12:43 PM Scope Withdrawal Time: 0 hours 16 minutes 8 seconds  Total Procedure Duration: 0 hours 28 minutes 39 seconds       [facility]

## 2019-01-06 ENCOUNTER — Encounter: Payer: Self-pay | Admitting: Gastroenterology

## 2019-01-07 LAB — SURGICAL PATHOLOGY

## 2019-01-17 ENCOUNTER — Ambulatory Visit
Admission: RE | Admit: 2019-01-17 | Discharge: 2019-01-17 | Disposition: A | Discharge status: Home / Self Care | Payer: Medicare HMO | Source: Ambulatory Visit | Attending: Internal Medicine | Admitting: Internal Medicine

## 2019-01-17 DIAGNOSIS — C50312 Malignant neoplasm of lower-inner quadrant of left female breast: Secondary | ICD-10-CM

## 2019-01-17 DIAGNOSIS — R928 Other abnormal and inconclusive findings on diagnostic imaging of breast: Secondary | ICD-10-CM | POA: Diagnosis not present

## 2019-01-30 DIAGNOSIS — I25119 Atherosclerotic heart disease of native coronary artery with unspecified angina pectoris: Secondary | ICD-10-CM | POA: Diagnosis not present

## 2019-01-30 DIAGNOSIS — I341 Nonrheumatic mitral (valve) prolapse: Secondary | ICD-10-CM | POA: Diagnosis not present

## 2019-01-30 DIAGNOSIS — I10 Essential (primary) hypertension: Secondary | ICD-10-CM | POA: Diagnosis not present

## 2019-02-11 DIAGNOSIS — M50121 Cervical disc disorder at C4-C5 level with radiculopathy: Secondary | ICD-10-CM | POA: Diagnosis not present

## 2019-02-11 DIAGNOSIS — M5010 Cervical disc disorder with radiculopathy, unspecified cervical region: Secondary | ICD-10-CM | POA: Diagnosis not present

## 2019-02-11 DIAGNOSIS — M47812 Spondylosis without myelopathy or radiculopathy, cervical region: Secondary | ICD-10-CM | POA: Diagnosis not present

## 2019-02-11 DIAGNOSIS — Z87891 Personal history of nicotine dependence: Secondary | ICD-10-CM | POA: Diagnosis not present

## 2019-02-11 DIAGNOSIS — R488 Other symbolic dysfunctions: Secondary | ICD-10-CM | POA: Diagnosis not present

## 2019-02-24 DIAGNOSIS — M5412 Radiculopathy, cervical region: Secondary | ICD-10-CM | POA: Diagnosis not present

## 2019-02-24 DIAGNOSIS — R251 Tremor, unspecified: Secondary | ICD-10-CM | POA: Diagnosis not present

## 2019-02-24 DIAGNOSIS — Z87891 Personal history of nicotine dependence: Secondary | ICD-10-CM | POA: Diagnosis not present

## 2019-02-24 DIAGNOSIS — R05 Cough: Secondary | ICD-10-CM | POA: Diagnosis not present

## 2019-03-06 ENCOUNTER — Other Ambulatory Visit: Payer: Self-pay

## 2019-03-06 ENCOUNTER — Inpatient Hospital Stay: Payer: Medicare HMO | Attending: Oncology | Admitting: Oncology

## 2019-03-06 ENCOUNTER — Encounter: Payer: Self-pay | Admitting: Oncology

## 2019-03-06 DIAGNOSIS — Z853 Personal history of malignant neoplasm of breast: Secondary | ICD-10-CM

## 2019-03-06 DIAGNOSIS — Z08 Encounter for follow-up examination after completed treatment for malignant neoplasm: Secondary | ICD-10-CM

## 2019-03-06 NOTE — Progress Notes (Signed)
Patient stated that she had been doing well with no complaints. 

## 2019-03-07 NOTE — Progress Notes (Signed)
I connected with Doris Barnes on 03/07/19 at 10:45 AM EST by video enabled telemedicine visit and verified that I am speaking with the correct person using two identifiers.   I discussed the limitations, risks, security and privacy concerns of performing an evaluation and management service by telemedicine and the availability of in-person appointments. I also discussed with the patient that there may be a patient responsible charge related to this service. The patient expressed understanding and agreed to proceed.  Other persons participating in the visit and their role in the encounter:  none  Patient's location:  home Provider's location:  Work  Diagnosis- Stage IA invasive mammary carcinoma of the left breast ER negative, PR weakly positive and HER-2 positive  Chief Complaint: Routine follow-up of breast cancer  History of present illness: Patient is a 78 year old female who was diagnosed with invasive mammary carcinoma of the left breast ER negative, PR 11 to 50% positive and HER-2/neu positive. Tumor was grade 314 mm. Lymph nodes were negative for malignancy. She started adjuvant Taxol Herceptin chemotherapy in October 2018 and has completed 1 year of adjuvant Herceptin. She is also completed adjuvant radiation. She could not tolerate AI and did not wish to continue hormone therapy.   Interval history she is doing well overall.  She denies any complaints at this time.  Headaches have resolved.  Appetite is good and she denies any unintentional weight loss.  Reports some weight gain.  Denies any new aches or pains anywhere.  Denies any trouble with vision or balance.   Review of Systems  Constitutional: Negative for chills, fever, malaise/fatigue and weight loss.  HENT: Negative for congestion, ear discharge and nosebleeds.   Eyes: Negative for blurred vision.  Respiratory: Negative for cough, hemoptysis, sputum production, shortness of breath and wheezing.   Cardiovascular:  Negative for chest pain, palpitations, orthopnea and claudication.  Gastrointestinal: Negative for abdominal pain, blood in stool, constipation, diarrhea, heartburn, melena, nausea and vomiting.  Genitourinary: Negative for dysuria, flank pain, frequency, hematuria and urgency.  Musculoskeletal: Negative for back pain, joint pain and myalgias.  Skin: Negative for rash.  Neurological: Negative for dizziness, tingling, focal weakness, seizures, weakness and headaches.  Endo/Heme/Allergies: Does not bruise/bleed easily.  Psychiatric/Behavioral: Negative for depression and suicidal ideas. The patient does not have insomnia.     Allergies  Allergen Reactions  . Imipramine Pamoate Shortness Of Breath  . Bisphosphonates Nausea Only  . Epinephrine Hcl (Nasal) Other (See Comments)    Difficulty breathing  . Prednisone Palpitations  . Sulfa Antibiotics Rash    Face turns red and stings    Past Medical History:  Diagnosis Date  . Anemia   . Anxiety   . Asthma   . Breast cancer ([place]) 12/14/2016   left breast  . Cancer ([place]) skin  . Chronic vulvitis   . Colon polyp   . Cystocele   . Diverticulitis   . Diverticulitis   . Diverticulosis   . Diverticulosis   . Dyspareunia, female   . Dyspnea   . Dysrhythmia   . Fibrocystic disease of both breasts   . Gastritis   . GERD (gastroesophageal reflux disease)    gastritis also  . Hemorrhoids   . History of hiatal hernia   . Hypercholesteremia   . Hypertension   . IBS (irritable bowel syndrome)   . IC (interstitial cystitis)   . Migraine   . Mitral valve prolapse   . OP (osteoporosis)   . Personal history of chemotherapy   .  Personal history of radiation therapy   . Positional vertigo   . Rectocele   . Squamous cell carcinoma   . Stroke [facility])    TIA  . Vaginal atrophy     Past Surgical History:  Procedure Laterality Date  . APPENDECTOMY    . BREAST BIOPSY Left 1998   core bx- neg  . BREAST BIOPSY Left 2007   neg  . BREAST  BIOPSY Left 12/14/2016   left breast US core positive  . BREAST EXCISIONAL BIOPSY Left   . BREAST LUMPECTOMY Left 01/01/2017    INVASIVE MAMMARY CARCINOMA/Grade 3  . CARDIAC CATHETERIZATION N/A 01/18/2015   Procedure: Left Heart Cath and Coronary Angiography;  Surgeon: Teodoro Spray, MD;  Location: [place] [facility];  Service: Cardiovascular;  Laterality: N/A;  . CATARACT EXTRACTION W/PHACO Right 12/15/2015   Procedure: CATARACT EXTRACTION PHACO AND INTRAOCULAR LENS PLACEMENT (IOC);  Surgeon: Estill Cotta, MD;  Location: [facility] ORS;  Service: Ophthalmology;  Laterality: Right;  [place] 01:20 AP% 23.9 CDE 35.49 Fluid pack lot # [id number]  . CATARACT EXTRACTION W/PHACO Left 12/29/2015   Procedure: CATARACT EXTRACTION PHACO AND INTRAOCULAR LENS PLACEMENT ([place]);  Surgeon: Estill Cotta, MD;  Location: [facility] OR[facility];  Service: Ophthalmology;  Laterality: Left;  [place]  01:20 AP% 25.1 CDE 32.86 Fluid pack lot # [id number]  . COLON SURGERY    . COLONOSCOPY WITH PROPOFOL N/A 02/21/2016   Procedure: COLONOSCOPY WITH PROPOFOL;  Surgeon: Manya Silvas, MD;  Location: [facility] ENDOSCOPY;  Service: Endoscopy;  Laterality: N/A;  . COLONOSCOPY WITH PROPOFOL N/A 01/03/2019   Procedure: COLONOSCOPY WITH PROPOFOL;  Surgeon: Lollie Sails, MD;  Location: [facility] ENDOSCOPY;  Service: Endoscopy;  Laterality: N/A;  . CORONARY ANGIOPLASTY    . DILATION AND CURETTAGE OF UTERUS    . ESOPHAGOGASTRODUODENOSCOPY (EGD) WITH PROPOFOL N/A 11/23/2017   Procedure: ESOPHAGOGASTRODUODENOSCOPY (EGD) WITH PROPOFOL;  Surgeon: Manya Silvas, MD;  Location: [facility] ENDOSCOPY;  Service: Endoscopy;  Laterality: N/A;  . OOPHORECTOMY Bilateral   . PARTIAL MASTECTOMY WITH NEEDLE LOCALIZATION Left 01/01/2017   Procedure: PARTIAL MASTECTOMY WITH NEEDLE LOCALIZATION;  Surgeon: Herbert Pun, MD;  Location: [facility] ORS;  Service: General;  Laterality: Left;  . PORTACATH PLACEMENT Right 01/01/2017   Procedure: INSERTION PORT-A-CATH;   Surgeon: Herbert Pun, MD;  Location: [facility] ORS;  Service: General;  Laterality: Right;  . SENTINEL NODE BIOPSY Left 01/01/2017   Procedure: SENTINEL NODE BIOPSY;  Surgeon: Herbert Pun, MD;  Location: [facility] ORS;  Service: General;  Laterality: Left;  . TONSILLECTOMY    . VAGINAL HYSTERECTOMY      Social History   Socioeconomic History  . Marital status: Married    Spouse name: Not on file  . Number of children: Not on file  . Years of education: Not on file  . Highest education level: Not on file  Occupational History  . Not on file  Tobacco Use  . Smoking status: Former Smoker    Packs/day: 1.00    Years: 33.00    Pack years: 33.00    Quit date: 10/17/1987    Years since quitting: 31.4  . Smokeless tobacco: Never Used  Substance and Sexual Activity  . Alcohol use: No  . Drug use: No  . Sexual activity: Yes  Other Topics Concern  . Not on file  Social History Narrative  . Not on file   Social Determinants of Health   Financial Resource Strain:   . Difficulty of Paying Living Expenses: Not on file  Food  Insecurity:   . Worried About Charity fundraiser in the Last Year: Not on file  . Ran Out of Food in the Last Year: Not on file  Transportation Needs:   . Lack of Transportation (Medical): Not on file  . Lack of Transportation (Non-Medical): Not on file  Physical Activity:   . Days of Exercise per Week: Not on file  . Minutes of Exercise per Session: Not on file  Stress:   . Feeling of Stress : Not on file  Social Connections:   . Frequency of Communication with Friends and Family: Not on file  . Frequency of Social Gatherings with Friends and Family: Not on file  . Attends Religious Services: Not on file  . Active Member of Clubs or Organizations: Not on file  . Attends Archivist Meetings: Not on file  . Marital Status: Not on file  Intimate Partner Violence:   . Fear of Current or Ex-Partner: Not on file  . Emotionally Abused: Not  on file  . Physically Abused: Not on file  . Sexually Abused: Not on file    Family History  Problem Relation Age of Onset  . Diabetes Mother   . Diabetes Father   . Colon cancer Sister   . Diabetes Sister   . Diabetes Maternal Aunt   . Colon cancer Paternal Grandfather   . Lung cancer Brother   . Breast cancer Neg Hx   . Ovarian cancer Neg Hx      Current Outpatient Medications:  .  acetaminophen (TYLENOL) 325 MG tablet, Take 650 mg by mouth every 6 (six) hours as needed (for headaches.). , Disp: , Rfl:  .  ALPRAZolam (XANAX) 0.25 MG tablet, Take 0.25 mg by mouth at bedtime as needed for anxiety or sleep. , Disp: , Rfl:  .  aspirin EC 325 MG tablet, Take 325 mg by mouth daily with breakfast. , Disp: , Rfl:  .  atenolol (TENORMIN) 50 MG tablet, Take 1 tablet by mouth daily., Disp: , Rfl:  .  calcium citrate-vitamin D (CITRACAL+D) 315-200 MG-UNIT tablet, Take 1 tablet by mouth 2 (two) times daily., Disp: , Rfl:  .  FLUoxetine (PROZAC) 40 MG capsule, Take 40 mg by mouth at bedtime., Disp: , Rfl:  .  fluticasone (FLONASE) 50 MCG/ACT nasal spray, Place 2 sprays into both nostrils daily as needed for allergies. , Disp: , Rfl:  .  hydrochlorothiazide (HYDRODIURIL) 25 MG tablet, Take 25 mg by mouth daily. , Disp: , Rfl:  .  lansoprazole (PREVACID) 30 MG capsule, Take 30 mg by mouth daily before breakfast. , Disp: , Rfl:  .  loratadine (CLARITIN) 10 MG tablet, Take 10 mg by mouth daily as needed for allergies. , Disp: , Rfl:  .  Magnesium 250 MG TABS, Take 250 mg by mouth daily., Disp: , Rfl:  .  Multiple Vitamins-Minerals (PRESERVISION AREDS 2) CAPS, Take 1 capsule by mouth 2 (two) times daily. , Disp: , Rfl:  .  polyethylene glycol (MIRALAX / GLYCOLAX) packet, Take 17 g by mouth daily as needed. , Disp: , Rfl:  .  simvastatin (ZOCOR) 20 MG tablet, Take 20 mg by mouth daily at 8 pm., Disp: , Rfl:  .  sucralfate (CARAFATE) 1 g tablet, Take 1 tablet by mouth as needed., Disp: , Rfl:  .   telmisartan (MICARDIS) 80 MG tablet, Take 40 mg by mouth daily with breakfast., Disp: , Rfl:  .  amoxicillin (AMOXIL) 500 MG capsule, Take 4  capsules by mouth as needed. For dental procedures, Disp: , Rfl:  .  propranolol (INDERAL) 40 MG tablet, Take 40 mg by mouth 2 (two) times daily., Disp: , Rfl:   No results found.  No images are attached to the encounter.   CMP Latest Ref Rng & Units 01/22/2018  Glucose 70 - 99 mg/dL 127(H)  BUN 8 - 23 mg/dL 11  Creatinine 0.44 - 1.00 mg/dL 0.59  Sodium 135 - 145 mmol/L 132(L)  Potassium 3.5 - 5.1 mmol/L 3.9  Chloride 98 - 111 mmol/L 97(L)  CO2 22 - 32 mmol/L 30  Calcium 8.9 - 10.3 mg/dL 9.1  Total Protein 6.5 - 8.1 g/dL 6.6  Total Bilirubin 0.3 - 1.2 mg/dL 0.5  Alkaline Phos 38 - 126 U/L 72  AST 15 - 41 U/L 23  ALT 0 - 44 U/L 18   CBC Latest Ref Rng & Units 01/22/2018  WBC 4.0 - 10.5 K/uL 4.7  Hemoglobin 12.0 - 15.0 g/dL 10.7(L)  Hematocrit 36.0 - 46.0 % 33.4(L)  Platelets 150 - 400 K/uL 182     Observation/objective: Appears in no acute distress of a video visit today.  Breathing is nonlabored  Assessment and plan:Patient is a 78 y.o. female pathologicalprognostic stage IA pT1c1p N0 cM0invasive mammary carcinoma of the left breast ER negative PR positive and HER-2 +3 on IHC s/p lumpectomy s/p adjuvant chemotherapy.She has also completed 1 year of adjuvant Herceptin. This is a routine follow-up visit for breast cancer.  Patient had a routine screening mammogram in October 2020 which did not reveal any evidence of malignancy.  Clinically patient is doing well and no concerning symptoms of recurrence based on today's visit.  Breast exam could not be performed as this is a virtual visit.   Follow-up instructions: I will see her back in 6 months for an in person visit.  Recent labs in endocrinology clinic were reviewed and were unremarkable  I discussed the assessment and treatment plan with the patient. The patient was provided an  opportunity to ask questions and all were answered. The patient agreed with the plan and demonstrated an understanding of the instructions.   The patient was advised to call back or seek an in-person evaluation if the symptoms worsen or if the condition fails to improve as anticipated.   Visit Diagnosis: 1. Encounter for follow-up surveillance of breast cancer     Dr. Randa Evens, MD, MPH [facility] Pager- [phone] 03/07/2019 11:27 AM

## 2019-03-26 ENCOUNTER — Encounter: Payer: Self-pay | Admitting: Emergency Medicine

## 2019-03-26 ENCOUNTER — Emergency Department: Payer: Medicare HMO

## 2019-03-26 ENCOUNTER — Other Ambulatory Visit: Payer: Self-pay

## 2019-03-26 ENCOUNTER — Emergency Department
Admission: EM | Admit: 2019-03-26 | Discharge: 2019-03-26 | Disposition: A | Discharge status: Home / Self Care | Payer: Medicare HMO | Attending: Student in an Organized Health Care Education/Training Program | Admitting: Student in an Organized Health Care Education/Training Program

## 2019-03-26 DIAGNOSIS — Z923 Personal history of irradiation: Secondary | ICD-10-CM | POA: Diagnosis not present

## 2019-03-26 DIAGNOSIS — T07XXXA Unspecified multiple injuries, initial encounter: Secondary | ICD-10-CM

## 2019-03-26 DIAGNOSIS — Z9221 Personal history of antineoplastic chemotherapy: Secondary | ICD-10-CM | POA: Insufficient documentation

## 2019-03-26 DIAGNOSIS — Z87891 Personal history of nicotine dependence: Secondary | ICD-10-CM | POA: Diagnosis not present

## 2019-03-26 DIAGNOSIS — S40011A Contusion of right shoulder, initial encounter: Secondary | ICD-10-CM | POA: Diagnosis not present

## 2019-03-26 DIAGNOSIS — S3992XA Unspecified injury of lower back, initial encounter: Secondary | ICD-10-CM | POA: Diagnosis not present

## 2019-03-26 DIAGNOSIS — W19XXXA Unspecified fall, initial encounter: Secondary | ICD-10-CM

## 2019-03-26 DIAGNOSIS — W108XXA Fall (on) (from) other stairs and steps, initial encounter: Secondary | ICD-10-CM | POA: Diagnosis not present

## 2019-03-26 DIAGNOSIS — R110 Nausea: Secondary | ICD-10-CM | POA: Insufficient documentation

## 2019-03-26 DIAGNOSIS — S32020A Wedge compression fracture of second lumbar vertebra, initial encounter for closed fracture: Secondary | ICD-10-CM | POA: Insufficient documentation

## 2019-03-26 DIAGNOSIS — Z8673 Personal history of transient ischemic attack (TIA), and cerebral infarction without residual deficits: Secondary | ICD-10-CM | POA: Diagnosis not present

## 2019-03-26 DIAGNOSIS — Z7982 Long term (current) use of aspirin: Secondary | ICD-10-CM | POA: Diagnosis not present

## 2019-03-26 DIAGNOSIS — S0990XA Unspecified injury of head, initial encounter: Secondary | ICD-10-CM | POA: Insufficient documentation

## 2019-03-26 DIAGNOSIS — Z853 Personal history of malignant neoplasm of breast: Secondary | ICD-10-CM | POA: Insufficient documentation

## 2019-03-26 DIAGNOSIS — M25511 Pain in right shoulder: Secondary | ICD-10-CM | POA: Diagnosis not present

## 2019-03-26 DIAGNOSIS — Z79899 Other long term (current) drug therapy: Secondary | ICD-10-CM | POA: Diagnosis not present

## 2019-03-26 DIAGNOSIS — M545 Low back pain: Secondary | ICD-10-CM | POA: Diagnosis not present

## 2019-03-26 DIAGNOSIS — Y998 Other external cause status: Secondary | ICD-10-CM | POA: Diagnosis not present

## 2019-03-26 DIAGNOSIS — S199XXA Unspecified injury of neck, initial encounter: Secondary | ICD-10-CM | POA: Diagnosis not present

## 2019-03-26 DIAGNOSIS — Z85828 Personal history of other malignant neoplasm of skin: Secondary | ICD-10-CM | POA: Insufficient documentation

## 2019-03-26 DIAGNOSIS — Y9301 Activity, walking, marching and hiking: Secondary | ICD-10-CM | POA: Diagnosis not present

## 2019-03-26 DIAGNOSIS — Y9289 Other specified places as the place of occurrence of the external cause: Secondary | ICD-10-CM | POA: Insufficient documentation

## 2019-03-26 DIAGNOSIS — S4991XA Unspecified injury of right shoulder and upper arm, initial encounter: Secondary | ICD-10-CM | POA: Diagnosis not present

## 2019-03-26 DIAGNOSIS — S8991XA Unspecified injury of right lower leg, initial encounter: Secondary | ICD-10-CM | POA: Diagnosis not present

## 2019-03-26 DIAGNOSIS — M25561 Pain in right knee: Secondary | ICD-10-CM | POA: Diagnosis not present

## 2019-03-26 DIAGNOSIS — M25521 Pain in right elbow: Secondary | ICD-10-CM | POA: Diagnosis not present

## 2019-03-26 DIAGNOSIS — S5001XA Contusion of right elbow, initial encounter: Secondary | ICD-10-CM | POA: Diagnosis not present

## 2019-03-26 DIAGNOSIS — S7001XA Contusion of right hip, initial encounter: Secondary | ICD-10-CM | POA: Diagnosis not present

## 2019-03-26 DIAGNOSIS — S59901A Unspecified injury of right elbow, initial encounter: Secondary | ICD-10-CM | POA: Diagnosis not present

## 2019-03-26 DIAGNOSIS — R079 Chest pain, unspecified: Secondary | ICD-10-CM | POA: Diagnosis not present

## 2019-03-26 MED ORDER — ONDANSETRON 4 MG PO TBDP: 4.0000 mg | ORAL_TABLET | Freq: Three times a day (TID) | ORAL | 0 refills | Status: DC | PRN

## 2019-03-26 MED ORDER — HYDROCODONE-ACETAMINOPHEN 5-325 MG PO TABS: 1.0000 | ORAL_TABLET | ORAL | 0 refills | Status: DC | PRN

## 2019-03-26 MED ORDER — METHOCARBAMOL 500 MG PO TABS: 500.0000 mg | ORAL_TABLET | Freq: Four times a day (QID) | ORAL | 0 refills | Status: DC

## 2019-03-26 NOTE — ED Triage Notes (Signed)
?   Brief LOC immediately after fall.

## 2019-03-26 NOTE — ED Triage Notes (Signed)
States fell down steps on Christmas day.  C/O low back back, left side pain, and right shoulder pain.  STates fall was caused by missing a step due to not seeing it because mask was on.  Denies dysuria.

## 2019-03-26 NOTE — ED Notes (Addendum)
First nurse note: Pt brought from [facility], fall on christmas day, +LOC, woke up today with bilateral hip pain and nausea. Baby ASA daily. Placed in wheelchair in lobby, NAD.

## 2019-03-26 NOTE — ED Provider Notes (Signed)
[facility] Emergency Department Provider Note  ____________________________________________  Time seen: Approximately 3:20 PM  I have reviewed the triage vital signs and the nursing notes.   HISTORY  Chief Complaint Fall    HPI Doris Barnes is a 78 y.o. female presents the emergency department for evaluation of right shoulder, right elbow, low back, right hip pain after a fall.  Patient sustained a mechanical fall 5 days ago on Christmas.  Patient was coming down a flight of stairs, missed 1 and fell the remaining 5 stairs.  Patient did have positive loss of consciousness briefly.  Patient is able to remember the events leading up to and after the injury.  Patient states that she was very sore initially, seem to be improving but awoke this morning with increased pain in the areas mentioned above as well as some nausea.  No emesis.  Patient denies any headache, blurred vision, neck pain.  No radicular symptoms in the upper or lower extremities.  No bowel or bladder dysfunction, saddle anesthesia or paresthesias.   Patient with medical history as described below.  No complaints of chronic medical problems.        Past Medical History:  Diagnosis Date  . Anemia   . Anxiety   . Asthma   . Breast cancer ([place]) 12/14/2016   left breast  . Cancer ([place]) skin  . Chronic vulvitis   . Colon polyp   . Cystocele   . Diverticulitis   . Diverticulitis   . Diverticulosis   . Diverticulosis   . Dyspareunia, female   . Dyspnea   . Dysrhythmia   . Fibrocystic disease of both breasts   . Gastritis   . GERD (gastroesophageal reflux disease)    gastritis also  . Hemorrhoids   . History of hiatal hernia   . Hypercholesteremia   . Hypertension   . IBS (irritable bowel syndrome)   . IC (interstitial cystitis)   . Migraine   . Mitral valve prolapse   . OP (osteoporosis)   . Personal history of chemotherapy   . Personal history of radiation therapy   .  Positional vertigo   . Rectocele   . Squamous cell carcinoma   . Stroke [facility])    TIA  . Vaginal atrophy     Patient Active Problem List   Diagnosis Date Noted  . Erosive gastritis 12/12/2017  . Encounter for monoclonal antibody treatment for malignancy 02/20/2017  . Neutropenia ([place]) 01/30/2017  . Goals of care, counseling/discussion 01/09/2017  . Breast cancer ([place]) 12/21/2016  . Malignant neoplasm of lower-inner quadrant of left female breast ([place]) 12/20/2016  . TIA (transient ischemic attack) 02/22/2016  . Ischemic colitis ([place]) 02/22/2016  . Psoriasiform dermatitis 01/07/2015  . Bloodgood disease 12/31/2014  . HLD (hyperlipidemia) 12/31/2014  . Headache, migraine 12/31/2014  . Billowing mitral valve 12/31/2014  . Osteoporosis, post-menopausal 12/31/2014  . Squamous cell carcinoma 12/31/2014  . Essential hypertension 09/18/2014  . Degeneration of cervical intervertebral disc 09/18/2014  . Ataxia 09/17/2014  . Adaptive colitis 02/02/2014    Past Surgical History:  Procedure Laterality Date  . APPENDECTOMY    . BREAST BIOPSY Left 1998   core bx- neg  . BREAST BIOPSY Left 2007   neg  . BREAST BIOPSY Left 12/14/2016   left breast US core positive  . BREAST EXCISIONAL BIOPSY Left   . BREAST LUMPECTOMY Left 01/01/2017    INVASIVE MAMMARY CARCINOMA/Grade 3  . CARDIAC CATHETERIZATION N/A 01/18/2015   Procedure:  Left Heart Cath and Coronary Angiography;  Surgeon: Teodoro Spray, MD;  Location: [place] CV LAB;  Service: Cardiovascular;  Laterality: N/A;  . CATARACT EXTRACTION W/PHACO Right 12/15/2015   Procedure: CATARACT EXTRACTION PHACO AND INTRAOCULAR LENS PLACEMENT (IOC);  Surgeon: Estill Cotta, MD;  Location: [facility] ORS;  Service: Ophthalmology;  Laterality: Right;  [place] 01:20 AP% 23.9 CDE 35.49 Fluid pack lot # [id number]  . CATARACT EXTRACTION W/PHACO Left 12/29/2015   Procedure: CATARACT EXTRACTION PHACO AND INTRAOCULAR LENS PLACEMENT ([place]);  Surgeon: Estill Cotta, MD;  Location: [facility] ORS;  Service: Ophthalmology;  Laterality: Left;  [place]  01:20 AP% 25.1 CDE 32.86 Fluid pack lot # [id number]  . COLON SURGERY    . COLONOSCOPY WITH PROPOFOL N/A 02/21/2016   Procedure: COLONOSCOPY WITH PROPOFOL;  Surgeon: Manya Silvas, MD;  Location: [facility] ENDOSCOPY;  Service: Endoscopy;  Laterality: N/A;  . COLONOSCOPY WITH PROPOFOL N/A 01/03/2019   Procedure: COLONOSCOPY WITH PROPOFOL;  Surgeon: Lollie Sails, MD;  Location: [facility] ENDOSCOPY;  Service: Endoscopy;  Laterality: N/A;  . CORONARY ANGIOPLASTY    . DILATION AND CURETTAGE OF UTERUS    . ESOPHAGOGASTRODUODENOSCOPY (EGD) WITH PROPOFOL N/A 11/23/2017   Procedure: ESOPHAGOGASTRODUODENOSCOPY (EGD) WITH PROPOFOL;  Surgeon: Manya Silvas, MD;  Location: [facility] ENDOSCOPY;  Service: Endoscopy;  Laterality: N/A;  . OOPHORECTOMY Bilateral   . PARTIAL MASTECTOMY WITH NEEDLE LOCALIZATION Left 01/01/2017   Procedure: PARTIAL MASTECTOMY WITH NEEDLE LOCALIZATION;  Surgeon: Herbert Pun, MD;  Location: [facility] ORS;  Service: General;  Laterality: Left;  . PORTACATH PLACEMENT Right 01/01/2017   Procedure: INSERTION PORT-A-CATH;  Surgeon: Herbert Pun, MD;  Location: [facility] ORS;  Service: General;  Laterality: Right;  . SENTINEL NODE BIOPSY Left 01/01/2017   Procedure: SENTINEL NODE BIOPSY;  Surgeon: Herbert Pun, MD;  Location: [facility] ORS;  Service: General;  Laterality: Left;  . TONSILLECTOMY    . VAGINAL HYSTERECTOMY      Prior to Admission medications   Medication Sig Start Date End Date Taking? Authorizing Provider  acetaminophen (TYLENOL) 325 MG tablet Take 650 mg by mouth every 6 (six) hours as needed (for headaches.).     [provider]  ALPRAZolam Duanne Moron) 0.25 MG tablet Take 0.25 mg by mouth at bedtime as needed for anxiety or sleep.  11/17/14   [provider]  amoxicillin (AMOXIL) 500 MG capsule Take 4 capsules by mouth as needed. For dental procedures 02/04/19    [provider]  aspirin EC 325 MG tablet Take 325 mg by mouth daily with breakfast.     [provider]  atenolol (TENORMIN) 50 MG tablet Take 1 tablet by mouth daily. 12/16/18   [provider]  calcium citrate-vitamin D (CITRACAL+D) 315-200 MG-UNIT tablet Take 1 tablet by mouth 2 (two) times daily.    [provider]  FLUoxetine (PROZAC) 40 MG capsule Take 40 mg by mouth at bedtime. 12/18/16   [provider]  fluticasone (FLONASE) 50 MCG/ACT nasal spray Place 2 sprays into both nostrils daily as needed for allergies.     [provider]  hydrochlorothiazide (HYDRODIURIL) 25 MG tablet Take 25 mg by mouth daily.  07/24/14   [provider]  HYDROcodone-acetaminophen (NORCO/VICODIN) 5-325 MG tablet Take 1 tablet by mouth every 4 (four) hours as needed for moderate pain. 03/26/19   Honest Vanleer, Charline Bills, PA-C  lansoprazole (PREVACID) 30 MG capsule Take 30 mg by mouth daily before breakfast.  07/24/14   [provider]  loratadine (CLARITIN) 10 MG tablet  Take 10 mg by mouth daily as needed for allergies.  03/19/14   [provider]  Magnesium 250 MG TABS Take 250 mg by mouth daily.    [provider]  methocarbamol (ROBAXIN) 500 MG tablet Take 1 tablet (500 mg total) by mouth 4 (four) times daily. 03/26/19   Truth Wolaver, Charline Bills, PA-C  Multiple Vitamins-Minerals (PRESERVISION AREDS 2) CAPS Take 1 capsule by mouth 2 (two) times daily.     [provider]  ondansetron (ZOFRAN-ODT) 4 MG disintegrating tablet Take 1 tablet (4 mg total) by mouth every 8 (eight) hours as needed for nausea or vomiting. 03/26/19   Garnetta Fedrick, Charline Bills, PA-C  polyethylene glycol (MIRALAX / GLYCOLAX) packet Take 17 g by mouth daily as needed.     [provider]  propranolol (INDERAL) 40 MG tablet Take 40 mg by mouth 2 (two) times daily. 02/24/19   [provider]  simvastatin (ZOCOR) 20 MG tablet Take 20 mg by  mouth daily at 8 pm. 11/04/16   [provider]  sucralfate (CARAFATE) 1 g tablet Take 1 tablet by mouth as needed. 06/07/18 06/07/19  [provider]  telmisartan (MICARDIS) 80 MG tablet Take 40 mg by mouth daily with breakfast. 11/23/16   [provider]  prochlorperazine (COMPAZINE) 10 MG tablet Take 1 tablet (10 mg total) by mouth every 6 (six) hours as needed (Nausea or vomiting). 01/04/17 04/06/17  Sindy Guadeloupe, MD    Allergies Imipramine pamoate, Bisphosphonates, Epinephrine hcl (nasal), Prednisone, and Sulfa antibiotics  Family History  Problem Relation Age of Onset  . Diabetes Mother   . Diabetes Father   . Colon cancer Sister   . Diabetes Sister   . Diabetes Maternal Aunt   . Colon cancer Paternal Grandfather   . Lung cancer Brother   . Breast cancer Neg Hx   . Ovarian cancer Neg Hx     Social History Social History   Tobacco Use  . Smoking status: Former Smoker    Packs/day: 1.00    Years: 33.00    Pack years: 33.00    Quit date: 10/17/1987    Years since quitting: 31.4  . Smokeless tobacco: Never Used  Substance Use Topics  . Alcohol use: No  . Drug use: No     Review of Systems  Constitutional: No fever/chills Eyes: No visual changes. No discharge ENT: No upper respiratory complaints. Cardiovascular: no chest pain. Respiratory: no cough. No SOB. Gastrointestinal: No abdominal pain.  No nausea, no vomiting.  No diarrhea.  No constipation. Genitourinary: Negative for dysuria. No hematuria Musculoskeletal: Positive for right shoulder, right elbow, back pain, right hip pain. Skin: Negative for rash, abrasions, lacerations, ecchymosis. Neurological: Negative for headaches but positive for loss consciousness at time of injury.  Denies focal weakness or numbness. 10-point ROS otherwise negative.  ____________________________________________   PHYSICAL EXAM:  VITAL SIGNS: ED Triage Vitals  Enc Vitals Group     BP 03/26/19 1324  (!) 152/72     Pulse Rate 03/26/19 1324 74     Resp 03/26/19 1324 16     Temp 03/26/19 1324 98.5 F (36.9 C)     Temp Source 03/26/19 1324 Oral     SpO2 03/26/19 1324 97 %     Weight 03/26/19 1323 124 lb (56.2 kg)     Height 03/26/19 1323 5\' 2"  (1.575 m)     Head Circumference --      Peak Flow --      Pain  Score 03/26/19 1322 8     Pain Loc --      Pain Edu? --      Excl. in [place]? --      Constitutional: Alert and oriented. Well appearing and in no acute distress. Eyes: Conjunctivae are normal. PERRL. EOMI. Head: Atraumatic. ENT:      Ears:       Nose: No congestion/rhinnorhea.      Mouth/Throat: Mucous membranes are moist.  Neck: No stridor.  No cervical spine tenderness to palpation.  Cardiovascular: Normal rate, regular rhythm. Normal S1 and S2.  Good peripheral circulation. Respiratory: Normal respiratory effort without tachypnea or retractions. Lungs CTAB. Good air entry to the bases with no decreased or absent breath sounds. Musculoskeletal: Full range of motion to all extremities. No gross deformities appreciated.  Visualization of the right shoulder reveals no gross traumatic findings.  Good range of motion to the right shoulder.  Patient is tender to palpation along the scapular line with no other tenderness to palpation of the right shoulder.  Visualization of the right elbow reveals edema, superficial abrasion to the posterior aspect of the elbow.  No erythema or warmth.  Good range of motion to the elbow at this time.  Patient is able to flex, extend, supinate and pronate.  Diffuse tenderness to palpation primarily over the posterior aspect of the elbow.  Mild ballottement.  No palpable osseous abnormality.  Examination of the wrist and hand is unremarkable.  Radial pulse and sensation intact distally.  Examination of the lumbar spine reveals no visible signs of trauma.  Patient is able to flex, extend and rotate the lumbar spine at this time.  Diffuse tenderness to palpation  both midline as well as both SI joints.  No palpable abnormality or step-off.  Palpation along the right hip reveals tenderness just distal to the greater trochanter region.  No inguinal, crest tenderness to palpation.  No palpable abnormality.  Examination of the right knee, ankle is unremarkable.  Patient with good pulses in bilateral lower extremities.  Sensation intact and equal bilateral lower extremities. Neurologic:  Normal speech and language. No gross focal neurologic deficits are appreciated.  Cranial nerves II through XII grossly intact.  Given injuries to the right shoulder, right hip, no pronator drift or Romberg is attempted at this time. Skin:  Skin is warm, dry and intact. No rash noted. Psychiatric: Mood and affect are normal. Speech and behavior are normal. Patient exhibits appropriate insight and judgement.   ____________________________________________   LABS (all labs ordered are listed, but only abnormal results are displayed)  Labs Reviewed - No data to display ____________________________________________  EKG   ____________________________________________  RADIOLOGY I personally viewed and evaluated these images as part of my medical decision making, as well as reviewing the written report by the radiologist.  I concur with radiologist finding of compression fracture in the lumbar spine.  Given pain, recent injury I suspect this is acute.  DG Chest 2 View  Result Date: 03/26/2019 CLINICAL DATA:  Fall down stairs several days ago with chest pain, initial encounter EXAM: CHEST - 2 VIEW COMPARISON:  11/01/2016 FINDINGS: Cardiac shadows within normal limits. Aortic calcifications are noted. Right chest wall port is been removed in the interval. The lungs are hyperinflated but clear. Some persistent interstitial changes are noted stable from the prior exam. No bony abnormality is seen. IMPRESSION: No acute abnormality noted. Electronically Signed   By: Inez Catalina M.D.    On: 03/26/2019 16:24  DG Lumbar Spine 2-3 Views  Result Date: 03/26/2019 CLINICAL DATA:  Fall down stairs several days ago with low back pain, initial encounter EXAM: LUMBAR SPINE - 2-3 VIEW COMPARISON:  01/23/2017 FINDINGS: Five lumbar type vertebral bodies are well visualized. Mild irregularity is noted at the superior endplate of L2 which may represent a mild compression deformity. This is new from a prior CT from [id number] but of uncertain chronicity. No other compression deformity is seen. No anterolisthesis is noted. Mild facet hypertrophic changes are seen. Aortic calcifications are noted without aneurysmal dilatation. IMPRESSION: Mild superior endplate deformity at L2 consistent with compression deformity. This is of uncertain chronicity correlation to point tenderness is recommended. Nonemergent MRI may be helpful for further evaluation. Electronically Signed   By: Inez Catalina M.D.   On: 03/26/2019 16:23   DG Shoulder Right  Result Date: 03/26/2019 CLINICAL DATA:  Fall down stairs several days ago with shoulder pain, initial encounter EXAM: RIGHT SHOULDER - 2+ VIEW COMPARISON:  None. FINDINGS: There is no evidence of fracture or dislocation. There is no evidence of arthropathy or other focal bone abnormality. Soft tissues are unremarkable. IMPRESSION: No acute abnormality noted. Electronically Signed   By: Inez Catalina M.D.   On: 03/26/2019 16:26   DG Elbow Complete Right  Result Date: 03/26/2019 CLINICAL DATA:  Fall down stairs several days ago with right elbow pain, initial encounter EXAM: RIGHT ELBOW - COMPLETE 3+ VIEW COMPARISON:  None. FINDINGS: Mild degenerative changes of the elbow joint are seen. No acute fracture or dislocation is noted. No joint effusion is seen. IMPRESSION: Mild degenerative changes without acute abnormality. Electronically Signed   By: Inez Catalina M.D.   On: 03/26/2019 16:24   CT Head Wo Contrast  Result Date: 03/26/2019 CLINICAL DATA:  Poly trauma, critical,  head/cervical spine injury suspected. Head trauma, moderate-severe. Additional provided: Head trauma with positive loss of consciousness. Injury falling down stairs. EXAM: CT HEAD WITHOUT CONTRAST CT CERVICAL SPINE WITHOUT CONTRAST TECHNIQUE: Multidetector CT imaging of the head and cervical spine was performed following the standard protocol without intravenous contrast. Multiplanar CT image reconstructions of the cervical spine were also generated. COMPARISON:  Brain MRI 09/19/2014, prior head CT examining 09/17/2014 and earlier, cervical spine MRI 09/19/2014 FINDINGS: CT HEAD FINDINGS Brain: No evidence of acute intracranial hemorrhage. No demarcated cortical infarction. No evidence of intracranial mass. No midline shift or extra-axial fluid collection. Mild ill-defined hypoattenuation within the cerebral white matter is nonspecific, but consistent with chronic small vessel ischemic disease. Mild generalized parenchymal atrophy. Vascular: No hyperdense vessel. Aortic atherosclerosis. Skull: Normal. Negative for fracture or focal lesion. Sinuses/Orbits: Visualized orbits demonstrate no acute abnormality.Mild scattered paranasal sinus mucosal thickening. Other: 9 mm partially calcified frontoparietal scalp lesion on the left, likely reflecting a sebaceous cyst. CT CERVICAL SPINE FINDINGS Alignment: No significant spondylolisthesis. Reversal of the expected cervical lordosis Skull base and vertebrae: The basion-dental and atlanto-dental intervals are maintained.No evidence of acute fracture to the cervical spine. Soft tissues and spinal canal: No prevertebral fluid or swelling. No visible canal hematoma. Disc levels: Cervical spondylosis with multilevel disc height loss, posterior disc osteophytes, uncovertebral and facet hypertrophy. Ossification of the posterior longitudinal ligament along the posterior aspect of the C6 vertebral body. Multilevel ventral osteophytes are also present. Upper chest: No consolidation  within the imaged lung apices. No visible pneumothorax. IMPRESSION: CT head: 1. No evidence of acute intracranial abnormality. 2. Generalized parenchymal atrophy and chronic small vessel ischemic disease. CT cervical spine: 1. No evidence of  acute fracture to the cervical spine. 2. Cervical spondylosis as described. Electronically Signed   By: Kellie Simmering DO   On: 03/26/2019 16:09   CT Cervical Spine Wo Contrast  Result Date: 03/26/2019 CLINICAL DATA:  Poly trauma, critical, head/cervical spine injury suspected. Head trauma, moderate-severe. Additional provided: Head trauma with positive loss of consciousness. Injury falling down stairs. EXAM: CT HEAD WITHOUT CONTRAST CT CERVICAL SPINE WITHOUT CONTRAST TECHNIQUE: Multidetector CT imaging of the head and cervical spine was performed following the standard protocol without intravenous contrast. Multiplanar CT image reconstructions of the cervical spine were also generated. COMPARISON:  Brain MRI 09/19/2014, prior head CT examining 09/17/2014 and earlier, cervical spine MRI 09/19/2014 FINDINGS: CT HEAD FINDINGS Brain: No evidence of acute intracranial hemorrhage. No demarcated cortical infarction. No evidence of intracranial mass. No midline shift or extra-axial fluid collection. Mild ill-defined hypoattenuation within the cerebral white matter is nonspecific, but consistent with chronic small vessel ischemic disease. Mild generalized parenchymal atrophy. Vascular: No hyperdense vessel. Aortic atherosclerosis. Skull: Normal. Negative for fracture or focal lesion. Sinuses/Orbits: Visualized orbits demonstrate no acute abnormality.Mild scattered paranasal sinus mucosal thickening. Other: 9 mm partially calcified frontoparietal scalp lesion on the left, likely reflecting a sebaceous cyst. CT CERVICAL SPINE FINDINGS Alignment: No significant spondylolisthesis. Reversal of the expected cervical lordosis Skull base and vertebrae: The basion-dental and atlanto-dental  intervals are maintained.No evidence of acute fracture to the cervical spine. Soft tissues and spinal canal: No prevertebral fluid or swelling. No visible canal hematoma. Disc levels: Cervical spondylosis with multilevel disc height loss, posterior disc osteophytes, uncovertebral and facet hypertrophy. Ossification of the posterior longitudinal ligament along the posterior aspect of the C6 vertebral body. Multilevel ventral osteophytes are also present. Upper chest: No consolidation within the imaged lung apices. No visible pneumothorax. IMPRESSION: CT head: 1. No evidence of acute intracranial abnormality. 2. Generalized parenchymal atrophy and chronic small vessel ischemic disease. CT cervical spine: 1. No evidence of acute fracture to the cervical spine. 2. Cervical spondylosis as described. Electronically Signed   By: Kellie Simmering DO   On: 03/26/2019 16:09   DG Knee Complete 4 Views Right  Result Date: 03/26/2019 CLINICAL DATA:  Fall down stairs several days ago with right knee pain, initial encounter EXAM: RIGHT KNEE - COMPLETE 4+ VIEW COMPARISON:  None. FINDINGS: No acute fracture or dislocation is noted. No joint effusion is seen. Mild vascular calcifications are seen. IMPRESSION: No acute abnormality noted. Electronically Signed   By: Inez Catalina M.D.   On: 03/26/2019 16:25    ____________________________________________    PROCEDURES  Procedure(s) performed:    Procedures    Medications - No data to display   ____________________________________________   INITIAL IMPRESSION / ASSESSMENT AND PLAN / ED COURSE  Pertinent labs & imaging results that were available during my care of the patient were reviewed by me and considered in my medical decision making (see chart for details).  Review of the [place] CSRS was performed in accordance of the [place] prior to dispensing any controlled drugs.           Patient's diagnosis is consistent with fall resulting in lumbar compression  fracture.  Patient presented to emergency department multiple complaints after a fall 5 days ago.  Patient was evaluated with imaging of the head and neck, shoulder, chest, elbow, lumbar spine and hip.  Patient does have findings consistent with lumbar compression fracture.  This is not involve more than a third of the bone height and there is no  indication for emergent MRI as patient is neurologically intact.  No indication for emergent neurosurgery referral or consult.  Patient will be placed on symptom control medications.  She is already on a low-dose anti-inflammatory.  I will add a muscle relaxer and limited pain medication.  Patient was also complaining of some nausea today from the pain and Zofran as prescribed.  Patient is stable for discharge at this time.  Follow-up with primary care. Patient is given ED precautions to return to the ED for any worsening or new symptoms.     ____________________________________________  FINAL CLINICAL IMPRESSION(S) / ED DIAGNOSES  Final diagnoses:  Fall, initial encounter  Multiple contusions  Compression fracture of L2 vertebra, initial encounter ([place])      NEW MEDICATIONS STARTED DURING THIS VISIT:  ED Discharge Orders         Ordered    ondansetron (ZOFRAN-ODT) 4 MG disintegrating tablet  Every 8 hours PRN     03/26/19 1741    HYDROcodone-acetaminophen (NORCO/VICODIN) 5-325 MG tablet  Every 4 hours PRN     03/26/19 1741    methocarbamol (ROBAXIN) 500 MG tablet  4 times daily     03/26/19 1741              This chart was dictated using voice recognition software/Dragon. Despite best efforts to proofread, errors can occur which can change the meaning. Any change was purely unintentional.    Darletta Moll, PA-C 03/26/19 1743    Merlyn Lot, MD 03/26/19 [phone]

## 2019-04-03 DIAGNOSIS — M5412 Radiculopathy, cervical region: Secondary | ICD-10-CM | POA: Diagnosis not present

## 2019-04-03 DIAGNOSIS — S32020A Wedge compression fracture of second lumbar vertebra, initial encounter for closed fracture: Secondary | ICD-10-CM | POA: Diagnosis not present

## 2019-04-03 DIAGNOSIS — Z87891 Personal history of nicotine dependence: Secondary | ICD-10-CM | POA: Diagnosis not present

## 2019-04-03 DIAGNOSIS — W010XXA Fall on same level from slipping, tripping and stumbling without subsequent striking against object, initial encounter: Secondary | ICD-10-CM | POA: Diagnosis not present

## 2019-04-04 ENCOUNTER — Other Ambulatory Visit: Payer: Self-pay | Admitting: Internal Medicine

## 2019-04-04 DIAGNOSIS — M5010 Cervical disc disorder with radiculopathy, unspecified cervical region: Secondary | ICD-10-CM

## 2019-04-15 ENCOUNTER — Other Ambulatory Visit: Payer: Self-pay

## 2019-04-15 ENCOUNTER — Ambulatory Visit
Admission: RE | Admit: 2019-04-15 | Discharge: 2019-04-15 | Disposition: A | Discharge status: Home / Self Care | Payer: Medicare HMO | Source: Ambulatory Visit | Attending: Internal Medicine | Admitting: Internal Medicine

## 2019-04-15 DIAGNOSIS — M5010 Cervical disc disorder with radiculopathy, unspecified cervical region: Secondary | ICD-10-CM

## 2019-04-15 DIAGNOSIS — M4802 Spinal stenosis, cervical region: Secondary | ICD-10-CM | POA: Diagnosis not present

## 2019-04-30 DIAGNOSIS — M5030 Other cervical disc degeneration, unspecified cervical region: Secondary | ICD-10-CM | POA: Diagnosis not present

## 2019-04-30 DIAGNOSIS — M5412 Radiculopathy, cervical region: Secondary | ICD-10-CM | POA: Diagnosis not present

## 2019-05-28 DIAGNOSIS — M5412 Radiculopathy, cervical region: Secondary | ICD-10-CM | POA: Diagnosis not present

## 2019-05-28 DIAGNOSIS — M5030 Other cervical disc degeneration, unspecified cervical region: Secondary | ICD-10-CM | POA: Diagnosis not present

## 2019-06-09 DIAGNOSIS — E782 Mixed hyperlipidemia: Secondary | ICD-10-CM | POA: Diagnosis not present

## 2019-06-16 DIAGNOSIS — L570 Actinic keratosis: Secondary | ICD-10-CM | POA: Diagnosis not present

## 2019-06-16 DIAGNOSIS — R600 Localized edema: Secondary | ICD-10-CM | POA: Diagnosis not present

## 2019-06-16 DIAGNOSIS — Z87891 Personal history of nicotine dependence: Secondary | ICD-10-CM | POA: Diagnosis not present

## 2019-06-16 DIAGNOSIS — M5010 Cervical disc disorder with radiculopathy, unspecified cervical region: Secondary | ICD-10-CM | POA: Diagnosis not present

## 2019-06-16 DIAGNOSIS — M5116 Intervertebral disc disorders with radiculopathy, lumbar region: Secondary | ICD-10-CM | POA: Diagnosis not present

## 2019-06-16 DIAGNOSIS — Z0000 Encounter for general adult medical examination without abnormal findings: Secondary | ICD-10-CM | POA: Diagnosis not present

## 2019-07-03 DIAGNOSIS — M5030 Other cervical disc degeneration, unspecified cervical region: Secondary | ICD-10-CM | POA: Diagnosis not present

## 2019-07-03 DIAGNOSIS — M459 Ankylosing spondylitis of unspecified sites in spine: Secondary | ICD-10-CM | POA: Diagnosis not present

## 2019-07-16 DIAGNOSIS — D2272 Melanocytic nevi of left lower limb, including hip: Secondary | ICD-10-CM | POA: Diagnosis not present

## 2019-07-16 DIAGNOSIS — D2271 Melanocytic nevi of right lower limb, including hip: Secondary | ICD-10-CM | POA: Diagnosis not present

## 2019-07-16 DIAGNOSIS — D2262 Melanocytic nevi of left upper limb, including shoulder: Secondary | ICD-10-CM | POA: Diagnosis not present

## 2019-07-16 DIAGNOSIS — Z85828 Personal history of other malignant neoplasm of skin: Secondary | ICD-10-CM | POA: Diagnosis not present

## 2019-07-16 DIAGNOSIS — D692 Other nonthrombocytopenic purpura: Secondary | ICD-10-CM | POA: Diagnosis not present

## 2019-07-21 ENCOUNTER — Encounter: Payer: Self-pay | Admitting: Emergency Medicine

## 2019-07-21 ENCOUNTER — Emergency Department
Admission: EM | Admit: 2019-07-21 | Discharge: 2019-07-21 | Disposition: A | Discharge status: Home / Self Care | Payer: Medicare HMO | Attending: Emergency Medicine | Admitting: Emergency Medicine

## 2019-07-21 ENCOUNTER — Emergency Department: Payer: Medicare HMO

## 2019-07-21 ENCOUNTER — Other Ambulatory Visit: Payer: Self-pay

## 2019-07-21 DIAGNOSIS — M5412 Radiculopathy, cervical region: Secondary | ICD-10-CM | POA: Diagnosis not present

## 2019-07-21 DIAGNOSIS — Y929 Unspecified place or not applicable: Secondary | ICD-10-CM | POA: Diagnosis not present

## 2019-07-21 DIAGNOSIS — Y939 Activity, unspecified: Secondary | ICD-10-CM | POA: Insufficient documentation

## 2019-07-21 DIAGNOSIS — Z87891 Personal history of nicotine dependence: Secondary | ICD-10-CM | POA: Insufficient documentation

## 2019-07-21 DIAGNOSIS — W01198A Fall on same level from slipping, tripping and stumbling with subsequent striking against other object, initial encounter: Secondary | ICD-10-CM | POA: Diagnosis not present

## 2019-07-21 DIAGNOSIS — S41112A Laceration without foreign body of left upper arm, initial encounter: Secondary | ICD-10-CM | POA: Diagnosis not present

## 2019-07-21 DIAGNOSIS — W19XXXA Unspecified fall, initial encounter: Secondary | ICD-10-CM

## 2019-07-21 DIAGNOSIS — Z79899 Other long term (current) drug therapy: Secondary | ICD-10-CM | POA: Diagnosis not present

## 2019-07-21 DIAGNOSIS — R519 Headache, unspecified: Secondary | ICD-10-CM | POA: Diagnosis not present

## 2019-07-21 DIAGNOSIS — S199XXA Unspecified injury of neck, initial encounter: Secondary | ICD-10-CM | POA: Diagnosis not present

## 2019-07-21 DIAGNOSIS — Z7982 Long term (current) use of aspirin: Secondary | ICD-10-CM | POA: Diagnosis not present

## 2019-07-21 DIAGNOSIS — G44309 Post-traumatic headache, unspecified, not intractable: Secondary | ICD-10-CM | POA: Diagnosis not present

## 2019-07-21 DIAGNOSIS — Y999 Unspecified external cause status: Secondary | ICD-10-CM | POA: Diagnosis not present

## 2019-07-21 DIAGNOSIS — Z23 Encounter for immunization: Secondary | ICD-10-CM | POA: Diagnosis not present

## 2019-07-21 DIAGNOSIS — R600 Localized edema: Secondary | ICD-10-CM | POA: Diagnosis not present

## 2019-07-21 DIAGNOSIS — I10 Essential (primary) hypertension: Secondary | ICD-10-CM | POA: Insufficient documentation

## 2019-07-21 DIAGNOSIS — J45909 Unspecified asthma, uncomplicated: Secondary | ICD-10-CM | POA: Insufficient documentation

## 2019-07-21 DIAGNOSIS — S0993XA Unspecified injury of face, initial encounter: Secondary | ICD-10-CM | POA: Diagnosis not present

## 2019-07-21 MED ORDER — TETANUS-DIPHTH-ACELL PERTUSSIS 5-2.5-18.5 LF-MCG/0.5 IM SUSP
0.5000 mL | Freq: Once | INTRAMUSCULAR | Status: AC
  Administered 2019-07-21: 0.5 mL via INTRAMUSCULAR
  Filled 2019-07-21: qty 0.5

## 2019-07-21 NOTE — ED Provider Notes (Signed)
Emergency Department Provider Note  ____________________________________________  Time seen: Approximately 11:32 PM  I have reviewed the triage vital signs and the nursing notes.   HISTORY  Chief Complaint Fall   Historian Patient     HPI Doris Barnes is a 79 y.o. female presents to the emergency department after patient had a mechanical fall.  Patient states that she slipped off of her back door and hit her head against the wooden deck.  No loss of consciousness occurred.  Patient states that she had some pain along her nose and along her left forearm where she sustained a small skin tear.  She cannot recall her last tetanus shot.  No chest pain, chest tightness or abdominal pain.  Patient has been able to ambulate since injury occurred.    Past Medical History:  Diagnosis Date  . Anemia   . Anxiety   . Asthma   . Breast cancer ([place]) 12/14/2016   left breast  . Cancer ([place]) skin  . Chronic vulvitis   . Colon polyp   . Cystocele   . Diverticulitis   . Diverticulitis   . Diverticulosis   . Diverticulosis   . Dyspareunia, female   . Dyspnea   . Dysrhythmia   . Fibrocystic disease of both breasts   . Gastritis   . GERD (gastroesophageal reflux disease)    gastritis also  . Hemorrhoids   . History of hiatal hernia   . Hypercholesteremia   . Hypertension   . IBS (irritable bowel syndrome)   . IC (interstitial cystitis)   . Migraine   . Mitral valve prolapse   . OP (osteoporosis)   . Personal history of chemotherapy   . Personal history of radiation therapy   . Positional vertigo   . Rectocele   . Squamous cell carcinoma   . Stroke [facility])    TIA  . Vaginal atrophy      Immunizations up to date:  Yes.     Past Medical History:  Diagnosis Date  . Anemia   . Anxiety   . Asthma   . Breast cancer ([place]) 12/14/2016   left breast  . Cancer ([place]) skin  . Chronic vulvitis   . Colon polyp   . Cystocele   . Diverticulitis   . Diverticulitis   .  Diverticulosis   . Diverticulosis   . Dyspareunia, female   . Dyspnea   . Dysrhythmia   . Fibrocystic disease of both breasts   . Gastritis   . GERD (gastroesophageal reflux disease)    gastritis also  . Hemorrhoids   . History of hiatal hernia   . Hypercholesteremia   . Hypertension   . IBS (irritable bowel syndrome)   . IC (interstitial cystitis)   . Migraine   . Mitral valve prolapse   . OP (osteoporosis)   . Personal history of chemotherapy   . Personal history of radiation therapy   . Positional vertigo   . Rectocele   . Squamous cell carcinoma   . Stroke [facility])    TIA  . Vaginal atrophy     Patient Active Problem List   Diagnosis Date Noted  . Erosive gastritis 12/12/2017  . Encounter for monoclonal antibody treatment for malignancy 02/20/2017  . Neutropenia ([place]) 01/30/2017  . Goals of care, counseling/discussion 01/09/2017  . Breast cancer ([place]) 12/21/2016  . Malignant neoplasm of lower-inner quadrant of left female breast ([place]) 12/20/2016  . TIA (transient ischemic attack) 02/22/2016  . Ischemic colitis ([place])  02/22/2016  . Psoriasiform dermatitis 01/07/2015  . Bloodgood disease 12/31/2014  . HLD (hyperlipidemia) 12/31/2014  . Headache, migraine 12/31/2014  . Billowing mitral valve 12/31/2014  . Osteoporosis, post-menopausal 12/31/2014  . Squamous cell carcinoma 12/31/2014  . Essential hypertension 09/18/2014  . Degeneration of cervical intervertebral disc 09/18/2014  . Ataxia 09/17/2014  . Adaptive colitis 02/02/2014    Past Surgical History:  Procedure Laterality Date  . APPENDECTOMY    . BREAST BIOPSY Left 1998   core bx- neg  . BREAST BIOPSY Left 2007   neg  . BREAST BIOPSY Left 12/14/2016   left breast US core positive  . BREAST EXCISIONAL BIOPSY Left   . BREAST LUMPECTOMY Left 01/01/2017    INVASIVE MAMMARY CARCINOMA/Grade 3  . CARDIAC CATHETERIZATION N/A 01/18/2015   Procedure: Left Heart Cath and Coronary Angiography;  Surgeon: Teodoro Spray, MD;  Location: [place] CV LAB;  Service: Cardiovascular;  Laterality: N/A;  . CATARACT EXTRACTION W/PHACO Right 12/15/2015   Procedure: CATARACT EXTRACTION PHACO AND INTRAOCULAR LENS PLACEMENT (IOC);  Surgeon: Estill Cotta, MD;  Location: [facility] ORS;  Service: Ophthalmology;  Laterality: Right;  [place] 01:20 AP% 23.9 CDE 35.49 Fluid pack lot # [id number]  . CATARACT EXTRACTION W/PHACO Left 12/29/2015   Procedure: CATARACT EXTRACTION PHACO AND INTRAOCULAR LENS PLACEMENT ([place]);  Surgeon: Estill Cotta, MD;  Location: [facility] ORS;  Service: Ophthalmology;  Laterality: Left;  [place]  01:20 AP% 25.1 CDE 32.86 Fluid pack lot # [id number]  . COLON SURGERY    . COLONOSCOPY WITH PROPOFOL N/A 02/21/2016   Procedure: COLONOSCOPY WITH PROPOFOL;  Surgeon: Manya Silvas, MD;  Location: [facility] ENDOSCOPY;  Service: Endoscopy;  Laterality: N/A;  . COLONOSCOPY WITH PROPOFOL N/A 01/03/2019   Procedure: COLONOSCOPY WITH PROPOFOL;  Surgeon: Lollie Sails, MD;  [facility];  Service: Endoscopy;  Laterality: N/A;  . CORONARY ANGIOPLASTY    . DILATION AND CURETTAGE OF UTERUS    . ESOPHAGOGASTRODUODENOSCOPY (EGD) WITH PROPOFOL N/A 11/23/2017   Procedure: ESOPHAGOGASTRODUODENOSCOPY (EGD) WITH PROPOFOL;  Surgeon: Manya Silvas, MD;  Location: [facility] ENDOSCOPY;  Service: Endoscopy;  Laterality: N/A;  . OOPHORECTOMY Bilateral   . PARTIAL MASTECTOMY WITH NEEDLE LOCALIZATION Left 01/01/2017   Procedure: PARTIAL MASTECTOMY WITH NEEDLE LOCALIZATION;  Surgeon: Herbert Pun, MD;  Location: [facility] ORS;  Service: General;  Laterality: Left;  . PORTACATH PLACEMENT Right 01/01/2017   Procedure: INSERTION PORT-A-CATH;  Surgeon: Herbert Pun, MD;  Location: [facility] ORS;  Service: General;  Laterality: Right;  . SENTINEL NODE BIOPSY Left 01/01/2017   Procedure: SENTINEL NODE BIOPSY;  Surgeon: Herbert Pun, MD;  Location: [facility] ORS;  Service: General;  Laterality: Left;  . TONSILLECTOMY    .  VAGINAL HYSTERECTOMY      Prior to Admission medications   Medication Sig Start Date End Date Taking? Authorizing Provider  acetaminophen (TYLENOL) 325 MG tablet Take 650 mg by mouth every 6 (six) hours as needed (for headaches.).     [provider]  ALPRAZolam Duanne Moron) 0.25 MG tablet Take 0.25 mg by mouth at bedtime as needed for anxiety or sleep.  11/17/14   [provider]  amoxicillin (AMOXIL) 500 MG capsule Take 4 capsules by mouth as needed. For dental procedures 02/04/19   [provider]  aspirin EC 325 MG tablet Take 325 mg by mouth daily with breakfast.     [provider]  atenolol (TENORMIN) 50 MG tablet Take 1 tablet by mouth daily. 12/16/18   [provider]  calcium citrate-vitamin  D (CITRACAL+D) 315-200 MG-UNIT tablet Take 1 tablet by mouth 2 (two) times daily.    [provider]  FLUoxetine (PROZAC) 40 MG capsule Take 40 mg by mouth at bedtime. 12/18/16   [provider]  fluticasone (FLONASE) 50 MCG/ACT nasal spray Place 2 sprays into both nostrils daily as needed for allergies.     [provider]  hydrochlorothiazide (HYDRODIURIL) 25 MG tablet Take 25 mg by mouth daily.  07/24/14   [provider]  HYDROcodone-acetaminophen (NORCO/VICODIN) 5-325 MG tablet Take 1 tablet by mouth every 4 (four) hours as needed for moderate pain. 03/26/19   Cuthriell, Charline Bills, PA-C  lansoprazole (PREVACID) 30 MG capsule Take 30 mg by mouth daily before breakfast.  07/24/14   [provider]  loratadine (CLARITIN) 10 MG tablet Take 10 mg by mouth daily as needed for allergies.  03/19/14   [provider]  Magnesium 250 MG TABS Take 250 mg by mouth daily.    [provider]  methocarbamol (ROBAXIN) 500 MG tablet Take 1 tablet (500 mg total) by mouth 4 (four) times daily. 03/26/19   Cuthriell, Charline Bills, PA-C  Multiple Vitamins-Minerals (PRESERVISION AREDS 2) CAPS Take 1 capsule by mouth 2 (two)  times daily.     [provider]  ondansetron (ZOFRAN-ODT) 4 MG disintegrating tablet Take 1 tablet (4 mg total) by mouth every 8 (eight) hours as needed for nausea or vomiting. 03/26/19   Cuthriell, Charline Bills, PA-C  polyethylene glycol (MIRALAX / GLYCOLAX) packet Take 17 g by mouth daily as needed.     [provider]  propranolol (INDERAL) 40 MG tablet Take 40 mg by mouth 2 (two) times daily. 02/24/19   [provider]  simvastatin (ZOCOR) 20 MG tablet Take 20 mg by mouth daily at 8 pm. 11/04/16   [provider]  sucralfate (CARAFATE) 1 g tablet Take 1 tablet by mouth as needed. 06/07/18 06/07/19  [provider]  telmisartan (MICARDIS) 80 MG tablet Take 40 mg by mouth daily with breakfast. 11/23/16   [provider]  prochlorperazine (COMPAZINE) 10 MG tablet Take 1 tablet (10 mg total) by mouth every 6 (six) hours as needed (Nausea or vomiting). 01/04/17 04/06/17  Sindy Guadeloupe, MD    Allergies Imipramine pamoate, Bisphosphonates, Epinephrine hcl (nasal), Prednisone, and Sulfa antibiotics  Family History  Problem Relation Age of Onset  . Diabetes Mother   . Diabetes Father   . Colon cancer Sister   . Diabetes Sister   . Diabetes Maternal Aunt   . Colon cancer Paternal Grandfather   . Lung cancer Brother   . Breast cancer Neg Hx   . Ovarian cancer Neg Hx     Social History Social History   Tobacco Use  . Smoking status: Former Smoker    Packs/day: 1.00    Years: 33.00    Pack years: 33.00    Quit date: 10/17/1987    Years since quitting: 31.7  . Smokeless tobacco: Never Used  Substance Use Topics  . Alcohol use: No  . Drug use: No     Review of Systems  Constitutional: No fever/chills Eyes:  No discharge ENT: No upper respiratory complaints. Respiratory: no cough. No SOB/ use of accessory muscles to breath Gastrointestinal:   No nausea, no vomiting.  No diarrhea.  No constipation. Musculoskeletal: Negative for  musculoskeletal pain. Neuro: Patient has headache.  Skin: Negative for rash, abrasions, lacerations, ecchymosis.    ____________________________________________   PHYSICAL EXAM:  VITAL  SIGNS: ED Triage Vitals  Enc Vitals Group     BP 07/21/19 1956 (!) 173/65     Pulse Rate 07/21/19 1956 79     Resp 07/21/19 1956 18     Temp 07/21/19 1956 98.3 F (36.8 C)     Temp Source 07/21/19 1956 Oral     SpO2 07/21/19 1956 98 %     Weight 07/21/19 1957 125 lb (56.7 kg)     Height 07/21/19 1957 5\' 2"  (1.575 m)     Head Circumference --      Peak Flow --      Pain Score 07/21/19 2256 8     Pain Loc --      Pain Edu? --      Excl. in [place]? --      Constitutional: Alert and oriented. Well appearing and in no acute distress. Eyes: Conjunctivae are normal. PERRL. EOMI. Head: Atraumatic. ENT:      Nose: No congestion/rhinnorhea.      Mouth/Throat: Mucous membranes are moist.  Neck: No stridor. FROM. No midline c spine tenderness.  Cardiovascular: Normal rate, regular rhythm. Normal S1 and S2.  Good peripheral circulation. Respiratory: Normal respiratory effort without tachypnea or retractions. Lungs CTAB. Good air entry to the bases with no decreased or absent breath sounds Gastrointestinal: Bowel sounds x 4 quadrants. Soft and nontender to palpation. No guarding or rigidity. No distention. Musculoskeletal: Full range of motion to all extremities. No obvious deformities noted Neurologic:  Normal for age. No gross focal neurologic deficits are appreciated.  Skin: Patient has a skin tear along left upper arm on the left. Psychiatric: Mood and affect are normal for age. Speech and behavior are normal.   ____________________________________________   LABS (all labs ordered are listed, but only abnormal results are displayed)  Labs Reviewed - No data to display ____________________________________________  EKG   ____________________________________________  RADIOLOGY Unk Pinto, personally viewed and evaluated these images (plain radiographs) as part of my medical decision making, as well as reviewing the written report by the radiologist.  CT Head Wo Contrast  Result Date: 07/21/2019 CLINICAL DATA:  Posttraumatic headache and facial injury after fall. No loss of consciousness. EXAM: CT HEAD WITHOUT CONTRAST CT MAXILLOFACIAL WITHOUT CONTRAST CT CERVICAL SPINE WITHOUT CONTRAST TECHNIQUE: Multidetector CT imaging of the head, cervical spine, and maxillofacial structures were performed using the standard protocol without intravenous contrast. Multiplanar CT image reconstructions of the cervical spine and maxillofacial structures were also generated. COMPARISON:  March 26, 2019. FINDINGS: CT HEAD FINDINGS Brain: Mild chronic ischemic white matter disease is noted. No mass effect or midline shift is noted. Ventricular size is within normal limits. There is no evidence of mass lesion, hemorrhage or acute infarction. Vascular: No hyperdense vessel or unexpected calcification. Skull: Normal. Negative for fracture or focal lesion. Other: None. CT MAXILLOFACIAL FINDINGS Osseous: No fracture or mandibular dislocation. No destructive process. Orbits: Negative. No traumatic or inflammatory finding. Sinuses: Clear. Soft tissues: Negative. CT CERVICAL SPINE FINDINGS Alignment: Normal. Skull base and vertebrae: No acute fracture. No primary bone lesion or focal pathologic process. Soft tissues and spinal canal: No prevertebral fluid or swelling. No visible canal hematoma. Disc levels: Moderate degenerative disc disease is noted at C4-5. Mild degenerative disc disease with anterior osteophyte formation is noted at C5-6, C6-7 and C7-T1. Upper chest: Negative. Other: Degenerative changes are seen involving the posterior facet joints bilaterally. IMPRESSION: 1. Mild chronic ischemic white matter disease. No acute intracranial abnormality seen. 2. No  significant abnormality seen in the  maxillofacial region. 3. Multilevel degenerative disc disease. No fracture or spondylolisthesis is noted in the cervical spine. Electronically Signed   By: Marijo Conception M.D.   On: 07/21/2019 21:03   CT Cervical Spine Wo Contrast  Result Date: 07/21/2019 CLINICAL DATA:  Posttraumatic headache and facial injury after fall. No loss of consciousness. EXAM: CT HEAD WITHOUT CONTRAST CT MAXILLOFACIAL WITHOUT CONTRAST CT CERVICAL SPINE WITHOUT CONTRAST TECHNIQUE: Multidetector CT imaging of the head, cervical spine, and maxillofacial structures were performed using the standard protocol without intravenous contrast. Multiplanar CT image reconstructions of the cervical spine and maxillofacial structures were also generated. COMPARISON:  March 26, 2019. FINDINGS: CT HEAD FINDINGS Brain: Mild chronic ischemic white matter disease is noted. No mass effect or midline shift is noted. Ventricular size is within normal limits. There is no evidence of mass lesion, hemorrhage or acute infarction. Vascular: No hyperdense vessel or unexpected calcification. Skull: Normal. Negative for fracture or focal lesion. Other: None. CT MAXILLOFACIAL FINDINGS Osseous: No fracture or mandibular dislocation. No destructive process. Orbits: Negative. No traumatic or inflammatory finding. Sinuses: Clear. Soft tissues: Negative. CT CERVICAL SPINE FINDINGS Alignment: Normal. Skull base and vertebrae: No acute fracture. No primary bone lesion or focal pathologic process. Soft tissues and spinal canal: No prevertebral fluid or swelling. No visible canal hematoma. Disc levels: Moderate degenerative disc disease is noted at C4-5. Mild degenerative disc disease with anterior osteophyte formation is noted at C5-6, C6-7 and C7-T1. Upper chest: Negative. Other: Degenerative changes are seen involving the posterior facet joints bilaterally. IMPRESSION: 1. Mild chronic ischemic white matter disease. No acute intracranial abnormality seen. 2. No  significant abnormality seen in the maxillofacial region. 3. Multilevel degenerative disc disease. No fracture or spondylolisthesis is noted in the cervical spine. Electronically Signed   By: Marijo Conception M.D.   On: 07/21/2019 21:03   CT Maxillofacial Wo Contrast  Result Date: 07/21/2019 CLINICAL DATA:  Posttraumatic headache and facial injury after fall. No loss of consciousness. EXAM: CT HEAD WITHOUT CONTRAST CT MAXILLOFACIAL WITHOUT CONTRAST CT CERVICAL SPINE WITHOUT CONTRAST TECHNIQUE: Multidetector CT imaging of the head, cervical spine, and maxillofacial structures were performed using the standard protocol without intravenous contrast. Multiplanar CT image reconstructions of the cervical spine and maxillofacial structures were also generated. COMPARISON:  March 26, 2019. FINDINGS: CT HEAD FINDINGS Brain: Mild chronic ischemic white matter disease is noted. No mass effect or midline shift is noted. Ventricular size is within normal limits. There is no evidence of mass lesion, hemorrhage or acute infarction. Vascular: No hyperdense vessel or unexpected calcification. Skull: Normal. Negative for fracture or focal lesion. Other: None. CT MAXILLOFACIAL FINDINGS Osseous: No fracture or mandibular dislocation. No destructive process. Orbits: Negative. No traumatic or inflammatory finding. Sinuses: Clear. Soft tissues: Negative. CT CERVICAL SPINE FINDINGS Alignment: Normal. Skull base and vertebrae: No acute fracture. No primary bone lesion or focal pathologic process. Soft tissues and spinal canal: No prevertebral fluid or swelling. No visible canal hematoma. Disc levels: Moderate degenerative disc disease is noted at C4-5. Mild degenerative disc disease with anterior osteophyte formation is noted at C5-6, C6-7 and C7-T1. Upper chest: Negative. Other: Degenerative changes are seen involving the posterior facet joints bilaterally. IMPRESSION: 1. Mild chronic ischemic white matter disease. No acute  intracranial abnormality seen. 2. No significant abnormality seen in the maxillofacial region. 3. Multilevel degenerative disc disease. No fracture or spondylolisthesis is noted in the cervical spine. Electronically Signed   By: Sabino Dick  Jr M.D.   On: 07/21/2019 21:03    ____________________________________________    PROCEDURES  Procedure(s) performed:     Procedures   LACERATION REPAIR Performed by: Lannie Fields Authorized by: Lannie Fields Consent: Verbal consent obtained. Risks and benefits: risks, benefits and alternatives were discussed Consent given by: patient Patient identity confirmed: provided demographic data Prepped and Draped in normal sterile fashion Wound explored  Laceration Location: Left upper arm.   Laceration Length: 3 cm  No Foreign Bodies seen or palpated   Irrigation method: syringe Amount of cleaning: standard  Skin closure: Dermabond   Patient tolerance: Patient tolerated the procedure well with no immediate complications.   Medications  Tdap (BOOSTRIX) injection 0.5 mL (0.5 mLs Intramuscular Given 07/21/19 2257)     ____________________________________________   INITIAL IMPRESSION / ASSESSMENT AND PLAN / ED COURSE  Pertinent labs & imaging results that were available during my care of the patient were reviewed by me and considered in my medical decision making (see chart for details).      Assessment and plan Fall 80 year old female presents to the emergency department after mechanical fall.  CT of the head, neck and face revealed no evidence of intracranial bleed, C-spine fracture or facial fracture.  Dermabond was placed over patient's skin tear and abrasions along her nose.  Patient declined pain medications in the emergency department stating that she would like to take her gabapentin at home.  Return precautions were given to return with new or worsening  symptoms.    ____________________________________________  FINAL CLINICAL IMPRESSION(S) / ED DIAGNOSES  Final diagnoses:  Fall, initial encounter      NEW MEDICATIONS STARTED DURING THIS VISIT:  ED Discharge Orders    None          This chart was dictated using voice recognition software/Dragon. Despite best efforts to proofread, errors can occur which can change the meaning. Any change was purely unintentional.     Lannie Fields, PA-C 07/21/19 2337    Arta Silence, MD 07/25/19 [phone]

## 2019-07-21 NOTE — ED Triage Notes (Addendum)
Pt presents to ED from home after she tripped over the top step of her back door and hit her face on the wooden deck. Denies loc. Pt states her nose and her forehead are very painful. Skin tear noted to left arm and small laceration to the bride of her nose. Pt does take an aspirin daily.

## 2019-07-30 DIAGNOSIS — I341 Nonrheumatic mitral (valve) prolapse: Secondary | ICD-10-CM | POA: Diagnosis not present

## 2019-07-30 DIAGNOSIS — I25119 Atherosclerotic heart disease of native coronary artery with unspecified angina pectoris: Secondary | ICD-10-CM | POA: Diagnosis not present

## 2019-07-30 DIAGNOSIS — I10 Essential (primary) hypertension: Secondary | ICD-10-CM | POA: Diagnosis not present

## 2019-07-30 DIAGNOSIS — E782 Mixed hyperlipidemia: Secondary | ICD-10-CM | POA: Diagnosis not present

## 2019-07-30 DIAGNOSIS — R600 Localized edema: Secondary | ICD-10-CM | POA: Diagnosis not present

## 2019-08-07 DIAGNOSIS — M5412 Radiculopathy, cervical region: Secondary | ICD-10-CM | POA: Diagnosis not present

## 2019-08-07 DIAGNOSIS — M25511 Pain in right shoulder: Secondary | ICD-10-CM | POA: Diagnosis not present

## 2019-08-07 DIAGNOSIS — G8929 Other chronic pain: Secondary | ICD-10-CM | POA: Diagnosis not present

## 2019-08-15 DIAGNOSIS — M25511 Pain in right shoulder: Secondary | ICD-10-CM | POA: Diagnosis not present

## 2019-08-15 DIAGNOSIS — M778 Other enthesopathies, not elsewhere classified: Secondary | ICD-10-CM | POA: Diagnosis not present

## 2019-08-15 DIAGNOSIS — G8929 Other chronic pain: Secondary | ICD-10-CM | POA: Diagnosis not present

## 2019-08-15 DIAGNOSIS — M7541 Impingement syndrome of right shoulder: Secondary | ICD-10-CM | POA: Diagnosis not present

## 2019-08-18 DIAGNOSIS — G8929 Other chronic pain: Secondary | ICD-10-CM | POA: Diagnosis not present

## 2019-08-18 DIAGNOSIS — M542 Cervicalgia: Secondary | ICD-10-CM | POA: Diagnosis not present

## 2019-08-18 DIAGNOSIS — M6281 Muscle weakness (generalized): Secondary | ICD-10-CM | POA: Diagnosis not present

## 2019-08-18 DIAGNOSIS — M25511 Pain in right shoulder: Secondary | ICD-10-CM | POA: Diagnosis not present

## 2019-08-20 DIAGNOSIS — M542 Cervicalgia: Secondary | ICD-10-CM | POA: Diagnosis not present

## 2019-08-27 DIAGNOSIS — M6281 Muscle weakness (generalized): Secondary | ICD-10-CM | POA: Diagnosis not present

## 2019-08-27 DIAGNOSIS — M542 Cervicalgia: Secondary | ICD-10-CM | POA: Diagnosis not present

## 2019-08-27 DIAGNOSIS — G8929 Other chronic pain: Secondary | ICD-10-CM | POA: Diagnosis not present

## 2019-08-27 DIAGNOSIS — M25511 Pain in right shoulder: Secondary | ICD-10-CM | POA: Diagnosis not present

## 2019-08-29 DIAGNOSIS — M542 Cervicalgia: Secondary | ICD-10-CM | POA: Diagnosis not present

## 2019-09-02 DIAGNOSIS — M542 Cervicalgia: Secondary | ICD-10-CM | POA: Diagnosis not present

## 2019-09-02 DIAGNOSIS — G8929 Other chronic pain: Secondary | ICD-10-CM | POA: Diagnosis not present

## 2019-09-02 DIAGNOSIS — M6281 Muscle weakness (generalized): Secondary | ICD-10-CM | POA: Diagnosis not present

## 2019-09-02 DIAGNOSIS — M25511 Pain in right shoulder: Secondary | ICD-10-CM | POA: Diagnosis not present

## 2019-09-03 ENCOUNTER — Encounter: Payer: Self-pay | Admitting: Oncology

## 2019-09-03 ENCOUNTER — Ambulatory Visit
Admission: RE | Admit: 2019-09-03 | Discharge: 2019-09-03 | Disposition: A | Discharge status: Home / Self Care | Payer: Medicare HMO | Source: Ambulatory Visit | Attending: Radiation Oncology | Admitting: Radiation Oncology

## 2019-09-03 ENCOUNTER — Other Ambulatory Visit: Payer: Self-pay

## 2019-09-03 ENCOUNTER — Encounter: Payer: Self-pay | Admitting: Radiation Oncology

## 2019-09-03 VITALS — BP 141/63 | HR 71 | Wt 126.6 lb

## 2019-09-03 DIAGNOSIS — C50512 Malignant neoplasm of lower-outer quadrant of left female breast: Secondary | ICD-10-CM

## 2019-09-03 DIAGNOSIS — Z853 Personal history of malignant neoplasm of breast: Secondary | ICD-10-CM | POA: Diagnosis not present

## 2019-09-03 DIAGNOSIS — Z171 Estrogen receptor negative status [ER-]: Secondary | ICD-10-CM

## 2019-09-03 NOTE — Progress Notes (Signed)
Patient called for pre assessment. States she is having some back pain and rates pain at 5. She reports some nausea at least 2 times a week and states she currently has no medication. Would like to discuss refill on Zofran.

## 2019-09-03 NOTE — Progress Notes (Signed)
Radiation Oncology Follow up Note  Name: Doris Barnes   Date:   09/03/2019 MRN:  [id number] DOB: 1940/11/16    This 79 y.o. female presents to the clinic today for 2-year follow-up status post whole breast radiation to her left breast for stage I ER negative PR weakly positive HER-2/neu overexpressed invasive mammary carcinoma the left breast.  REFERRING PROVIDER: Rusty Aus, MD  HPI: Patient is a 79 year old female now seen out 2 years having completed whole breast radiation to her left breast for stage I HER-2/neu overexpressed invasive mammary carcinoma.  Seen today in routine follow-up she is doing fairly well she states she is quite fatigued.  She does complain of some bilateral breast tenderness.  She specifically denies cough or bone pain.Marland Kitchen  Her last mammograms were back in October which I have reviewed were BI-RADS 2 benign.  She is not on antiestrogen therapy.  COMPLICATIONS OF TREATMENT: none  FOLLOW UP COMPLIANCE: keeps appointments   PHYSICAL EXAM:  BP (!) 141/63 (BP Location: Right Arm, Patient Position: Sitting)   Pulse 71   Wt 126 lb 9.6 oz (57.4 kg)   BMI 23.16 kg/m  Lungs are clear to A&P cardiac examination essentially unremarkable with regular rate and rhythm. No dominant mass or nodularity is noted in either breast in 2 positions examined. Incision is well-healed. No axillary or supraclavicular adenopathy is appreciated.  There is some retraction of her scar although cosmetic result is still good.  Well-developed well-nourished patient in NAD. HEENT reveals PERLA, EOMI, discs not visualized.  Oral cavity is clear. No oral mucosal lesions are identified. Neck is clear without evidence of cervical or supraclavicular adenopathy. Lungs are clear to A&P. Cardiac examination is essentially unremarkable with regular rate and rhythm without murmur rub or thrill. Abdomen is benign with no organomegaly or masses noted. Motor sensory and DTR levels are equal and symmetric in the  upper and lower extremities. Cranial nerves II through XII are grossly intact. Proprioception is intact. No peripheral adenopathy or edema is identified. No motor or sensory levels are noted. Crude visual fields are within normal range.  RADIOLOGY RESULTS: Mammograms reviewed compatible with above-stated findings  PLAN: Present time patient is doing well with no evidence of disease 2 years out.  I am pleased with her overall progress not sure etiology of her fatigue.  May have to do with her prior chemotherapy.  I have asked to see her back in 1 year for follow-up.  Patient knows to call with any concerns at any time.  I would like to take this opportunity to thank you for allowing me to participate in the care of your patient.Noreene Filbert, MD

## 2019-09-04 ENCOUNTER — Inpatient Hospital Stay: Payer: Medicare HMO | Attending: Oncology | Admitting: Oncology

## 2019-09-04 VITALS — BP 147/78 | HR 71 | Temp 96.3°F | Wt 127.1 lb

## 2019-09-04 DIAGNOSIS — I341 Nonrheumatic mitral (valve) prolapse: Secondary | ICD-10-CM | POA: Diagnosis not present

## 2019-09-04 DIAGNOSIS — G8929 Other chronic pain: Secondary | ICD-10-CM | POA: Diagnosis not present

## 2019-09-04 DIAGNOSIS — Z7982 Long term (current) use of aspirin: Secondary | ICD-10-CM | POA: Insufficient documentation

## 2019-09-04 DIAGNOSIS — K219 Gastro-esophageal reflux disease without esophagitis: Secondary | ICD-10-CM | POA: Diagnosis not present

## 2019-09-04 DIAGNOSIS — Z87891 Personal history of nicotine dependence: Secondary | ICD-10-CM | POA: Insufficient documentation

## 2019-09-04 DIAGNOSIS — Z08 Encounter for follow-up examination after completed treatment for malignant neoplasm: Secondary | ICD-10-CM | POA: Diagnosis not present

## 2019-09-04 DIAGNOSIS — F419 Anxiety disorder, unspecified: Secondary | ICD-10-CM | POA: Diagnosis not present

## 2019-09-04 DIAGNOSIS — I10 Essential (primary) hypertension: Secondary | ICD-10-CM | POA: Insufficient documentation

## 2019-09-04 DIAGNOSIS — Z86010 Personal history of colonic polyps: Secondary | ICD-10-CM | POA: Diagnosis not present

## 2019-09-04 DIAGNOSIS — Z79899 Other long term (current) drug therapy: Secondary | ICD-10-CM | POA: Diagnosis not present

## 2019-09-04 DIAGNOSIS — M542 Cervicalgia: Secondary | ICD-10-CM | POA: Diagnosis not present

## 2019-09-04 DIAGNOSIS — R634 Abnormal weight loss: Secondary | ICD-10-CM | POA: Diagnosis not present

## 2019-09-04 DIAGNOSIS — M25511 Pain in right shoulder: Secondary | ICD-10-CM | POA: Diagnosis not present

## 2019-09-04 DIAGNOSIS — Z853 Personal history of malignant neoplasm of breast: Secondary | ICD-10-CM | POA: Diagnosis not present

## 2019-09-04 DIAGNOSIS — E7800 Pure hypercholesterolemia, unspecified: Secondary | ICD-10-CM | POA: Diagnosis not present

## 2019-09-04 DIAGNOSIS — M6281 Muscle weakness (generalized): Secondary | ICD-10-CM | POA: Diagnosis not present

## 2019-09-04 DIAGNOSIS — Z8673 Personal history of transient ischemic attack (TIA), and cerebral infarction without residual deficits: Secondary | ICD-10-CM | POA: Diagnosis not present

## 2019-09-04 DIAGNOSIS — C50912 Malignant neoplasm of unspecified site of left female breast: Secondary | ICD-10-CM | POA: Diagnosis present

## 2019-09-06 NOTE — Progress Notes (Signed)
Hematology/Oncology Consult note [facility]  [phone] [phone]  Patient Care Team: Rusty Aus, MD as PCP - General (Internal Medicine) Sindy Guadeloupe, MD as Consulting Physician (Oncology) Herbert Pun, MD as Consulting Physician (General Surgery) Rico Junker, RN as Oncology Nurse Navigator Noreene Filbert, MD as Referring Physician (Radiation Oncology)   Name of the patient: Doris Barnes  [id number]  October 02, 1940   Date of visit: 09/06/19  Diagnosis- Stage IA invasive mammary carcinoma of the left breast ER negative, PR weakly positive and HER-2 positive  Chief complaint/ Reason for visit-routine follow-up of breast cancer  Heme/Onc history: Patient is a 79 year old female who was diagnosed with invasive mammary carcinoma of the left breast ER negative, PR 11 to 50% positive and HER-2/neu positive. Tumor was grade 314 mm. Lymph nodes were negative for malignancy. She started adjuvant Taxol Herceptin chemotherapy in October 2018 and has completed 1 year of adjuvant Herceptin. She is also completed adjuvant radiation. She could not tolerate AI and did not wish to continue hormone therapy.   Interval history-patient is currently doing well.  Her appetite and weight is stable.  Denies any new aches and pains anywhere.  Denies any unintentional weight loss.  ECOG PS- 1 Pain scale- 0   Review of systems- Review of Systems  Constitutional: Negative for chills, fever, malaise/fatigue and weight loss.  HENT: Negative for congestion, ear discharge and nosebleeds.   Eyes: Negative for blurred vision.  Respiratory: Negative for cough, hemoptysis, sputum production, shortness of breath and wheezing.   Cardiovascular: Negative for chest pain, palpitations, orthopnea and claudication.  Gastrointestinal: Negative for abdominal pain, blood in stool, constipation, diarrhea, heartburn, melena, nausea and vomiting.    Genitourinary: Negative for dysuria, flank pain, frequency, hematuria and urgency.  Musculoskeletal: Negative for back pain, joint pain and myalgias.  Skin: Negative for rash.  Neurological: Negative for dizziness, tingling, focal weakness, seizures, weakness and headaches.  Endo/Heme/Allergies: Does not bruise/bleed easily.  Psychiatric/Behavioral: Negative for depression and suicidal ideas. The patient does not have insomnia.       Allergies  Allergen Reactions  . Imipramine Pamoate Shortness Of Breath  . Bisphosphonates Nausea Only  . Epinephrine Hcl (Nasal) Other (See Comments)    Difficulty breathing  . Prednisone Palpitations  . Sulfa Antibiotics Rash    Face turns red and stings     Past Medical History:  Diagnosis Date  . Anemia   . Anxiety   . Asthma   . Breast cancer ([place]) 12/14/2016   left breast  . Cancer ([place]) skin  . Chronic vulvitis   . Colon polyp   . Cystocele   . Diverticulitis   . Diverticulitis   . Diverticulosis   . Diverticulosis   . Dyspareunia, female   . Dyspnea   . Dysrhythmia   . Fibrocystic disease of both breasts   . Gastritis   . GERD (gastroesophageal reflux disease)    gastritis also  . Hemorrhoids   . History of hiatal hernia   . Hypercholesteremia   . Hypertension   . IBS (irritable bowel syndrome)   . IC (interstitial cystitis)   . Migraine   . Mitral valve prolapse   . OP (osteoporosis)   . Personal history of chemotherapy   . Personal history of radiation therapy   . Positional vertigo   . Rectocele   . Squamous cell carcinoma   . Stroke [facility])    TIA  . Vaginal atrophy  Past Surgical History:  Procedure Laterality Date  . APPENDECTOMY    . BREAST BIOPSY Left 1998   core bx- neg  . BREAST BIOPSY Left 2007   neg  . BREAST BIOPSY Left 12/14/2016   left breast US core positive  . BREAST EXCISIONAL BIOPSY Left   . BREAST LUMPECTOMY Left 01/01/2017    INVASIVE MAMMARY CARCINOMA/Grade 3  . CARDIAC  CATHETERIZATION N/A 01/18/2015   Procedure: Left Heart Cath and Coronary Angiography;  Surgeon: Teodoro Spray, MD;  Location: [place] [facility];  Service: Cardiovascular;  Laterality: N/A;  . CATARACT EXTRACTION W/PHACO Right 12/15/2015   Procedure: CATARACT EXTRACTION PHACO AND INTRAOCULAR LENS PLACEMENT (IOC);  Surgeon: Estill Cotta, MD;  Location: [facility] ORS;  Service: Ophthalmology;  Laterality: Right;  [place] 01:20 AP% 23.9 CDE 35.49 Fluid pack lot # [id number]  . CATARACT EXTRACTION W/PHACO Left 12/29/2015   Procedure: CATARACT EXTRACTION PHACO AND INTRAOCULAR LENS PLACEMENT ([place]);  Surgeon: Estill Cotta, MD;  Location: [facility] ORS;  Service: Ophthalmology;  Laterality: Left;  [place]  01:20 AP% 25.1 CDE 32.86 Fluid pack lot # [id number]  . COLON SURGERY    . COLONOSCOPY WITH PROPOFOL N/A 02/21/2016   Procedure: COLONOSCOPY WITH PROPOFOL;  Surgeon: Manya Silvas, MD;  Location: [facility] ENDOSCOPY;  Service: Endoscopy;  Laterality: N/A;  . COLONOSCOPY WITH PROPOFOL N/A 01/03/2019   Procedure: COLONOSCOPY WITH PROPOFOL;  Surgeon: Lollie Sails, MD;  Location: [facility] ENDOSCOPY;  Service: Endoscopy;  Laterality: N/A;  . CORONARY ANGIOPLASTY    . DILATION AND CURETTAGE OF UTERUS    . ESOPHAGOGASTRODUODENOSCOPY (EGD) WITH PROPOFOL N/A 11/23/2017   Procedure: ESOPHAGOGASTRODUODENOSCOPY (EGD) WITH PROPOFOL;  Surgeon: Manya Silvas, MD;  Location: [facility] ENDOSCOPY;  Service: Endoscopy;  Laterality: N/A;  . OOPHORECTOMY Bilateral   . PARTIAL MASTECTOMY WITH NEEDLE LOCALIZATION Left 01/01/2017   Procedure: PARTIAL MASTECTOMY WITH NEEDLE LOCALIZATION;  Surgeon: Herbert Pun, MD;  Location: [facility] ORS;  Service: General;  Laterality: Left;  . PORTACATH PLACEMENT Right 01/01/2017   Procedure: INSERTION PORT-A-CATH;  Surgeon: Herbert Pun, MD;  Location: [facility] ORS;  Service: General;  Laterality: Right;  . SENTINEL NODE BIOPSY Left 01/01/2017   Procedure: SENTINEL NODE BIOPSY;  Surgeon:  Herbert Pun, MD;  Location: [facility] ORS;  Service: General;  Laterality: Left;  . TONSILLECTOMY    . VAGINAL HYSTERECTOMY      Social History   Socioeconomic History  . Marital status: Married    Spouse name: Not on file  . Number of children: Not on file  . Years of education: Not on file  . Highest education level: Not on file  Occupational History  . Not on file  Tobacco Use  . Smoking status: Former Smoker    Packs/day: 1.00    Years: 33.00    Pack years: 33.00    Quit date: 10/17/1987    Years since quitting: 31.9  . Smokeless tobacco: Never Used  Vaping Use  . Vaping Use: Never used  Substance and Sexual Activity  . Alcohol use: No  . Drug use: No  . Sexual activity: Yes  Other Topics Concern  . Not on file  Social History Narrative  . Not on file   Social Determinants of Health   Financial Resource Strain:   . Difficulty of Paying Living Expenses:   Food Insecurity:   . Worried About Charity fundraiser in the Last Year:   . Arboriculturist in the Last Year:   Transportation Needs:   .  Lack of Transportation (Medical):   Marland Kitchen Lack of Transportation (Non-Medical):   Physical Activity:   . Days of Exercise per Week:   . Minutes of Exercise per Session:   Stress:   . Feeling of Stress :   Social Connections:   . Frequency of Communication with Friends and Family:   . Frequency of Social Gatherings with Friends and Family:   . Attends Religious Services:   . Active Member of Clubs or Organizations:   . Attends Archivist Meetings:   Marland Kitchen Marital Status:   Intimate Partner Violence:   . Fear of Current or Ex-Partner:   . Emotionally Abused:   Marland Kitchen Physically Abused:   . Sexually Abused:     Family History  Problem Relation Age of Onset  . Diabetes Mother   . Diabetes Father   . Colon cancer Sister   . Diabetes Sister   . Diabetes Maternal Aunt   . Colon cancer Paternal Grandfather   . Lung cancer Brother   . Breast cancer Neg Hx   .  Ovarian cancer Neg Hx      Current Outpatient Medications:  .  acetaminophen (TYLENOL) 325 MG tablet, Take 650 mg by mouth every 6 (six) hours as needed (for headaches.). , Disp: , Rfl:  .  amoxicillin (AMOXIL) 500 MG capsule, Take 4 capsules by mouth as needed. For dental procedures, Disp: , Rfl:  .  aspirin EC 325 MG tablet, Take 325 mg by mouth daily with breakfast. , Disp: , Rfl:  .  atenolol (TENORMIN) 50 MG tablet, Take 1 tablet by mouth daily., Disp: , Rfl:  .  calcium citrate-vitamin D (CITRACAL+D) 315-200 MG-UNIT tablet, Take 1 tablet by mouth 2 (two) times daily., Disp: , Rfl:  .  FLUoxetine (PROZAC) 40 MG capsule, Take 40 mg by mouth at bedtime., Disp: , Rfl:  .  furosemide (LASIX) 20 MG tablet, Take 20 mg by mouth daily., Disp: , Rfl:  .  gabapentin (NEURONTIN) 100 MG capsule, Take 100 mg by mouth 5 (five) times daily., Disp: , Rfl:  .  lansoprazole (PREVACID) 30 MG capsule, Take 30 mg by mouth daily before breakfast. , Disp: , Rfl:  .  loratadine (CLARITIN) 10 MG tablet, Take 10 mg by mouth daily as needed for allergies. , Disp: , Rfl:  .  Magnesium 250 MG TABS, Take 250 mg by mouth daily., Disp: , Rfl:  .  Multiple Vitamins-Minerals (PRESERVISION AREDS 2) CAPS, Take 1 capsule by mouth 2 (two) times daily. , Disp: , Rfl:  .  polyethylene glycol (MIRALAX / GLYCOLAX) packet, Take 17 g by mouth daily as needed. , Disp: , Rfl:  .  propranolol (INDERAL) 40 MG tablet, Take 40 mg by mouth 2 (two) times daily., Disp: , Rfl:  .  simvastatin (ZOCOR) 20 MG tablet, Take 20 mg by mouth daily at 8 pm., Disp: , Rfl:  .  spironolactone (ALDACTONE) 25 MG tablet, Take 25 mg by mouth daily., Disp: , Rfl:  .  sucralfate (CARAFATE) 1 g tablet, Take 1 tablet by mouth as needed., Disp: , Rfl:  .  telmisartan (MICARDIS) 80 MG tablet, Take 40 mg by mouth daily with breakfast., Disp: , Rfl:  .  ondansetron (ZOFRAN-ODT) 4 MG disintegrating tablet, Take 1 tablet (4 mg total) by mouth every 8 (eight) hours as  needed for nausea or vomiting. (Patient not taking: Reported on 09/03/2019), Disp: 20 tablet, Rfl: 0  Physical exam:  Vitals:   09/04/19 1025  BP: (!) 147/78  Pulse: 71  Temp: (!) 96.3 F (35.7 C)  TempSrc: Tympanic  SpO2: 98%  Weight: 127 lb 1.6 oz (57.7 kg)   Physical Exam Constitutional:      General: She is not in acute distress. Cardiovascular:     Rate and Rhythm: Normal rate and regular rhythm.     Heart sounds: Normal heart sounds.  Pulmonary:     Effort: Pulmonary effort is normal.     Breath sounds: Normal breath sounds.  Skin:    General: Skin is warm and dry.  Neurological:     Mental Status: She is alert and oriented to person, place, and time.      Breast exam was performed in seated and lying down position. Patient is status post left lumpectomy with a well-healed surgical scar. No evidence of any palpable masses. No evidence of axillary adenopathy. No evidence of any palpable masses or lumps in the right breast. No evidence of right axillary adenopathy  CMP Latest Ref Rng & Units 01/22/2018  Glucose 70 - 99 mg/dL 127(H)  BUN 8 - 23 mg/dL 11  Creatinine 0.44 - 1.00 mg/dL 0.59  Sodium 135 - 145 mmol/L 132(L)  Potassium 3.5 - 5.1 mmol/L 3.9  Chloride 98 - 111 mmol/L 97(L)  CO2 22 - 32 mmol/L 30  Calcium 8.9 - 10.3 mg/dL 9.1  Total Protein 6.5 - 8.1 g/dL 6.6  Total Bilirubin 0.3 - 1.2 mg/dL 0.5  Alkaline Phos 38 - 126 U/L 72  AST 15 - 41 U/L 23  ALT 0 - 44 U/L 18   CBC Latest Ref Rng & Units 01/22/2018  WBC 4.0 - 10.5 K/uL 4.7  Hemoglobin 12.0 - 15.0 g/dL 10.7(L)  Hematocrit 36 - 46 % 33.4(L)  Platelets 150 - 400 K/uL 182     Assessment and plan- Patient is a 79 y.o. female pathologicalprognostic stage IA pT1c1p N0 cM0invasive mammary carcinoma of the left breast ER negative PR positive and HER-2 +3 on IHC s/p lumpectomy s/p adjuvant chemotherapy.She has also completed 1 year of adjuvant Herceptin.   She is here for routine follow-up of breast  cancer   Patient is clinically doing well and no concerning signs and symptoms of recurrence based on today's exam.  She could not tolerate hormone therapy and stopped it.  Her tumor was ER negative and weakly PR positive.  She will be due for a repeat mammogram in October 2021 which will be coordinated by Dr. Sabra Heck.  I will see her back in 6 months for a video visit.  No labs   Visit Diagnosis 1. Encounter for follow-up surveillance of breast cancer      Dr. Randa Evens, MD, MPH [facility] [phone] 09/06/2019 10:11 PM

## 2019-09-07 IMAGING — MG MM DIGITAL DIAGNOSTIC UNILAT*L* W/ TOMO W/ CAD
8 of 10 series · 8 of 18 positions shown · non-contrast
Comparison: Previous exams including recent screening mammogram
dated 11/28/2016.

CLINICAL DATA: Patient returns today to evaluate a possible left
breast mass and calcifications identified on recent screening
mammogram.

EXAM:
2D DIGITAL DIAGNOSTIC LEFT MAMMOGRAM WITH CAD AND ADJUNCT TOMO
ULTRASOUND LEFT BREAST

[L ML (1 of 2)]
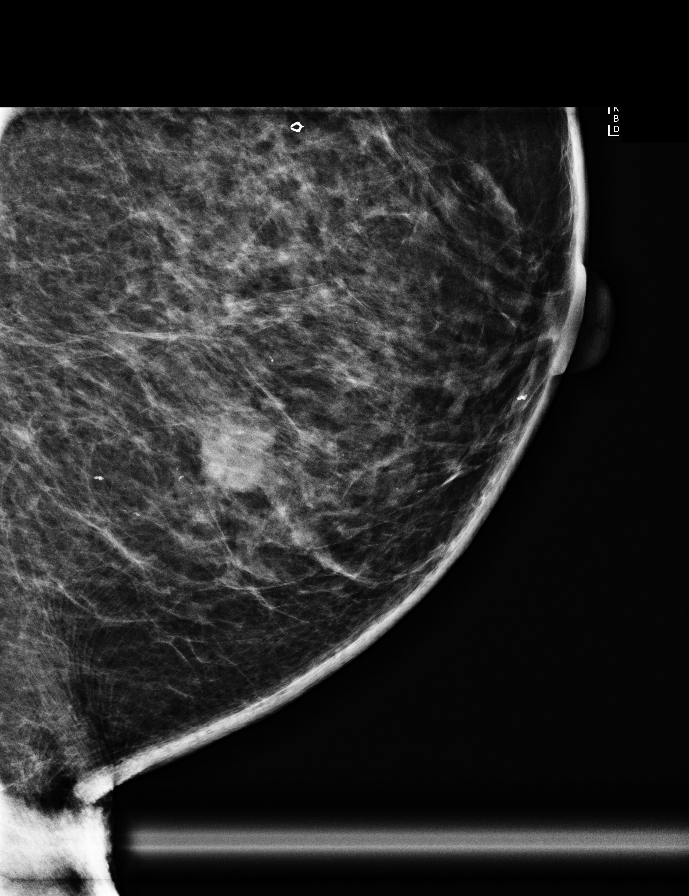

[L CC (1 of 3)]
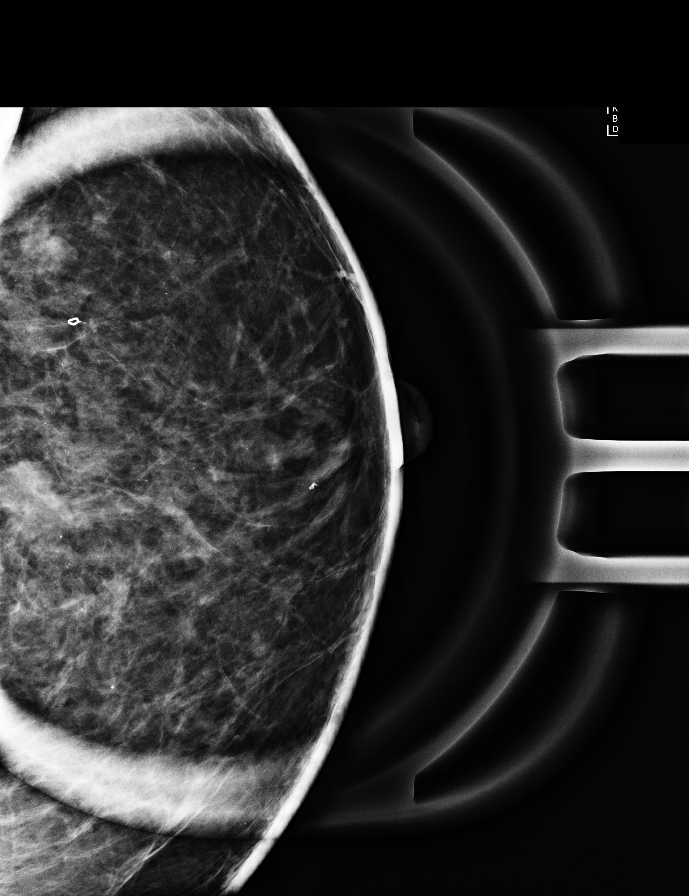

[L CC (2 of 3)]
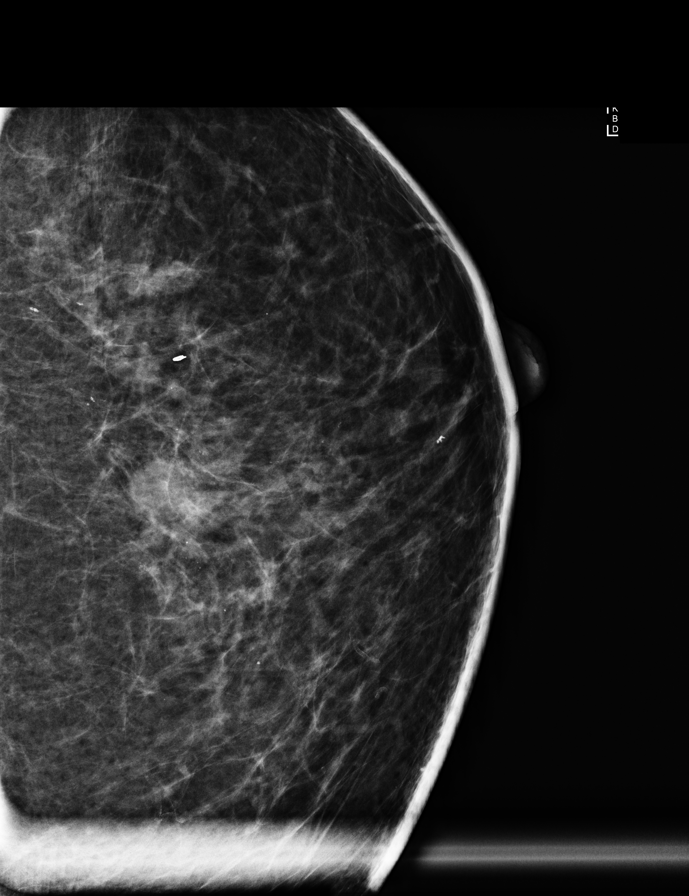

[L ML (2 of 2)]
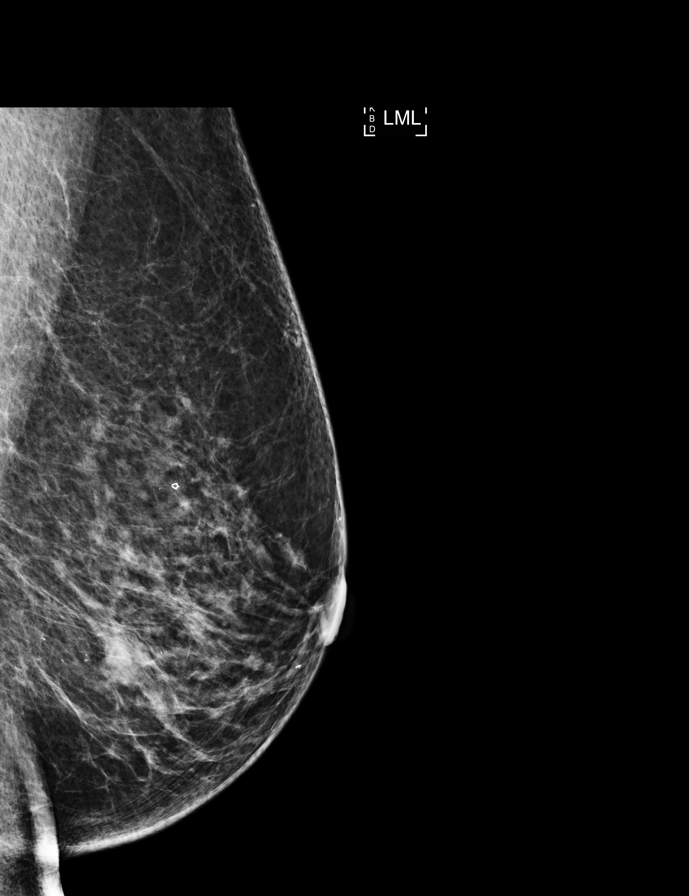

[L CC synth-2D]
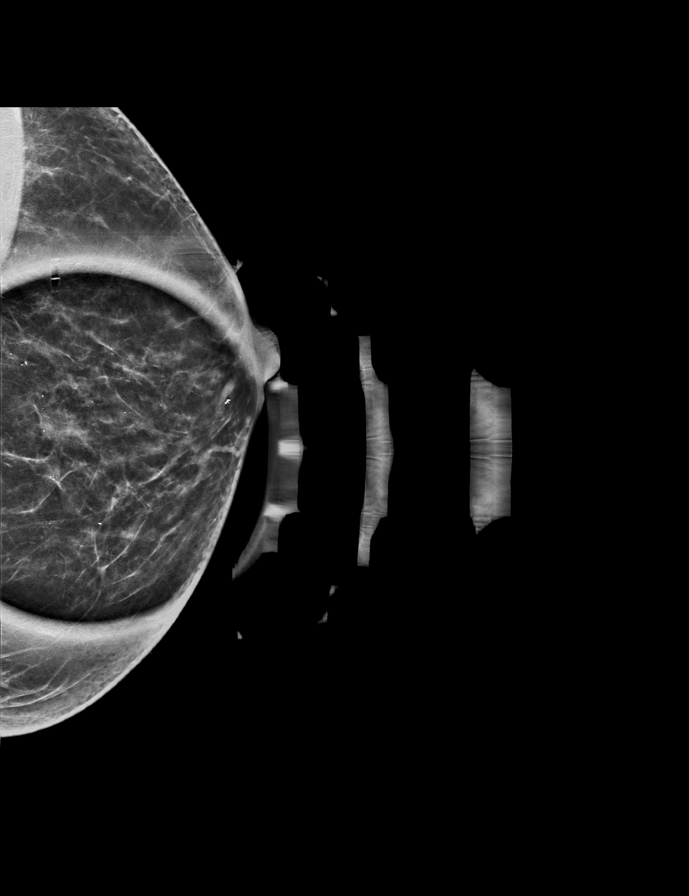

[L MLO synth-2D]
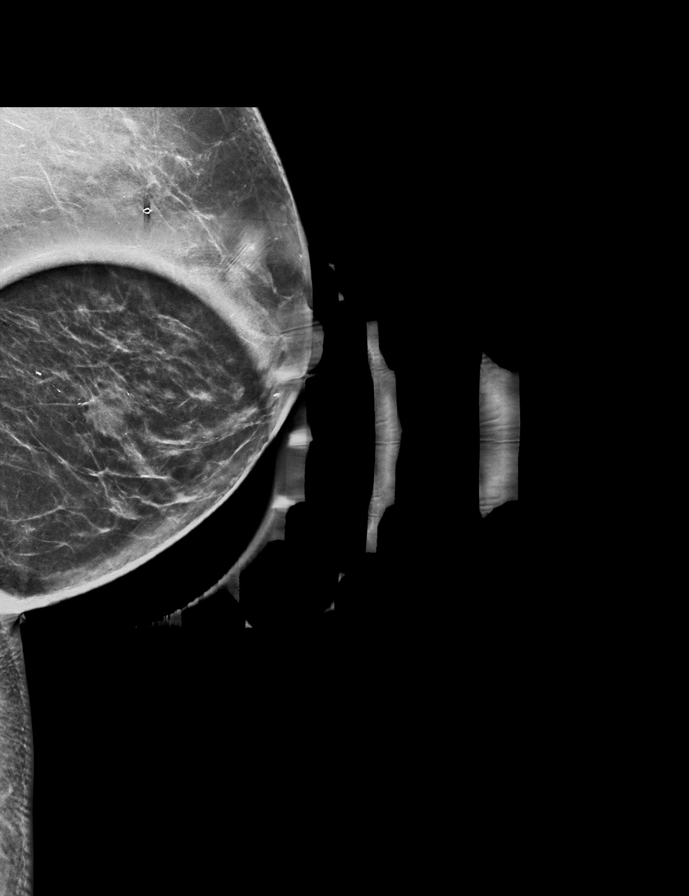

[L CC (3 of 3)]
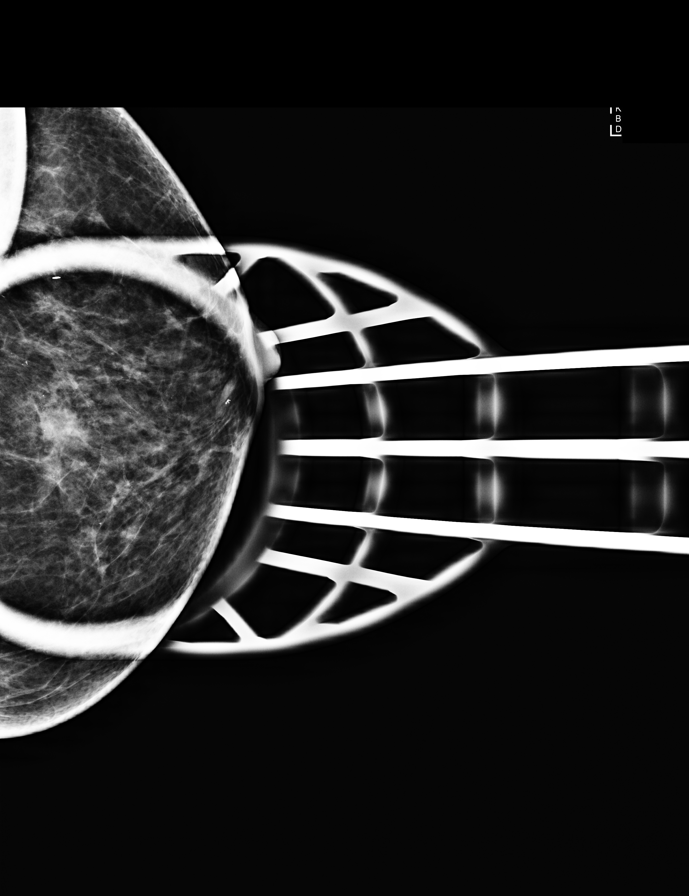

[L MLO]
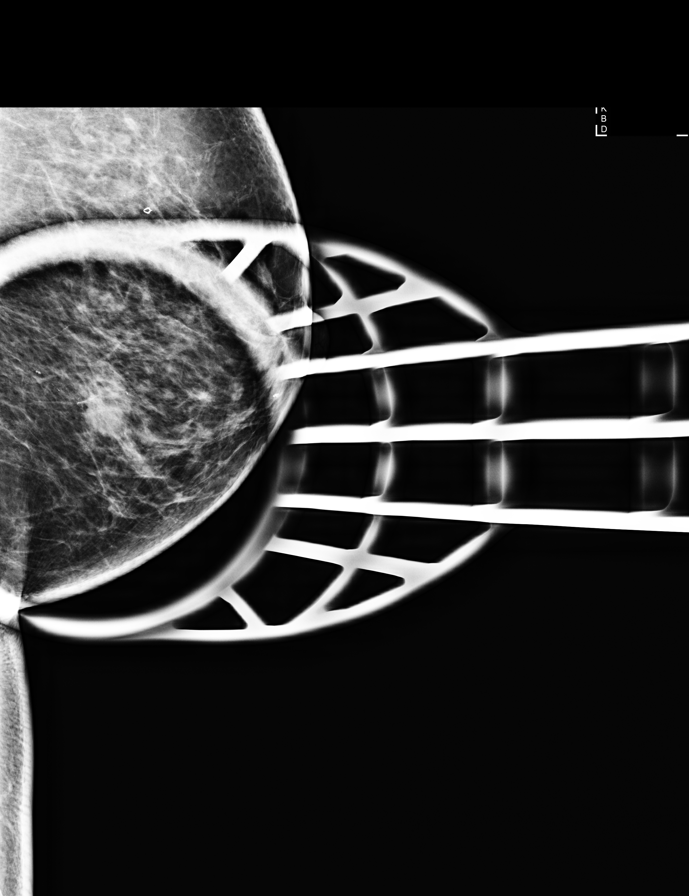

[8 of 18 positions shown; findings below may reference images not displayed]

ACR Breast Density Category b: There are scattered areas of
fibroglandular density.
FINDINGS: On today's additional views with spot compression and 3D
tomosynthesis, an irregular mass is confirmed within the lower inner
quadrant of the left breast, with associated architectural
distortion, measuring approximately 1.3 cm greatest dimension, with
associated microcalcifications. On magnification views, the overall
extent of mass and calcifications measures 1.9 cm.

Biopsy clip within the upper left breast is stable in position.

Mammographic images were processed with CAD.

Targeted ultrasound is performed, showing an irregular mass within
the left breast at the 6:30 o'clock axis, 3 cm from the nipple,
measuring 1.4 x 1.2 x 1.3 cm, with internal vascularity,
corresponding to the mammographic finding.

Left axilla was evaluated with ultrasound showing no enlarged or
morphologically abnormal lymph nodes.
IMPRESSION: Irregular mass within the left breast at the 6:30 o'clock axis, 3 cm
from the nipple, measuring 1.4 cm, corresponding to the mammographic
finding. This is a highly suspicious finding for which
ultrasound-guided biopsy is recommended.

Of note, there are associated suspicious microcalcifications within
and immediately adjacent to the mass. The combination of mass and
surrounding calcifications spans 1.9 cm.

RECOMMENDATION:
Ultrasound-guided biopsy of the left breast mass at the 6:30 o'clock
axis, 3 cm from the nipple, measuring 1.4 cm.

Ordering physician will be contacted with today's results and
patient will then be scheduled for ultrasound-guided biopsy at her
earliest convenience.

I have discussed the findings and recommendations with the patient.
Results were also provided in writing at the conclusion of the
visit. If applicable, a reminder letter will be sent to the patient
regarding the next appointment.

BI-RADS CATEGORY  5: Highly suggestive of malignancy.

## 2019-09-14 IMAGING — MG MM BREAST LOCALIZATION CLIP
2 series · 2 of 2 positions shown · non-contrast
Comparison: Previous exam(s).

CLINICAL DATA: Status post ultrasound-guided left breast biopsy

EXAM:
DIAGNOSTIC LEFT MAMMOGRAM POST ULTRASOUND BIOPSY

[L CC]
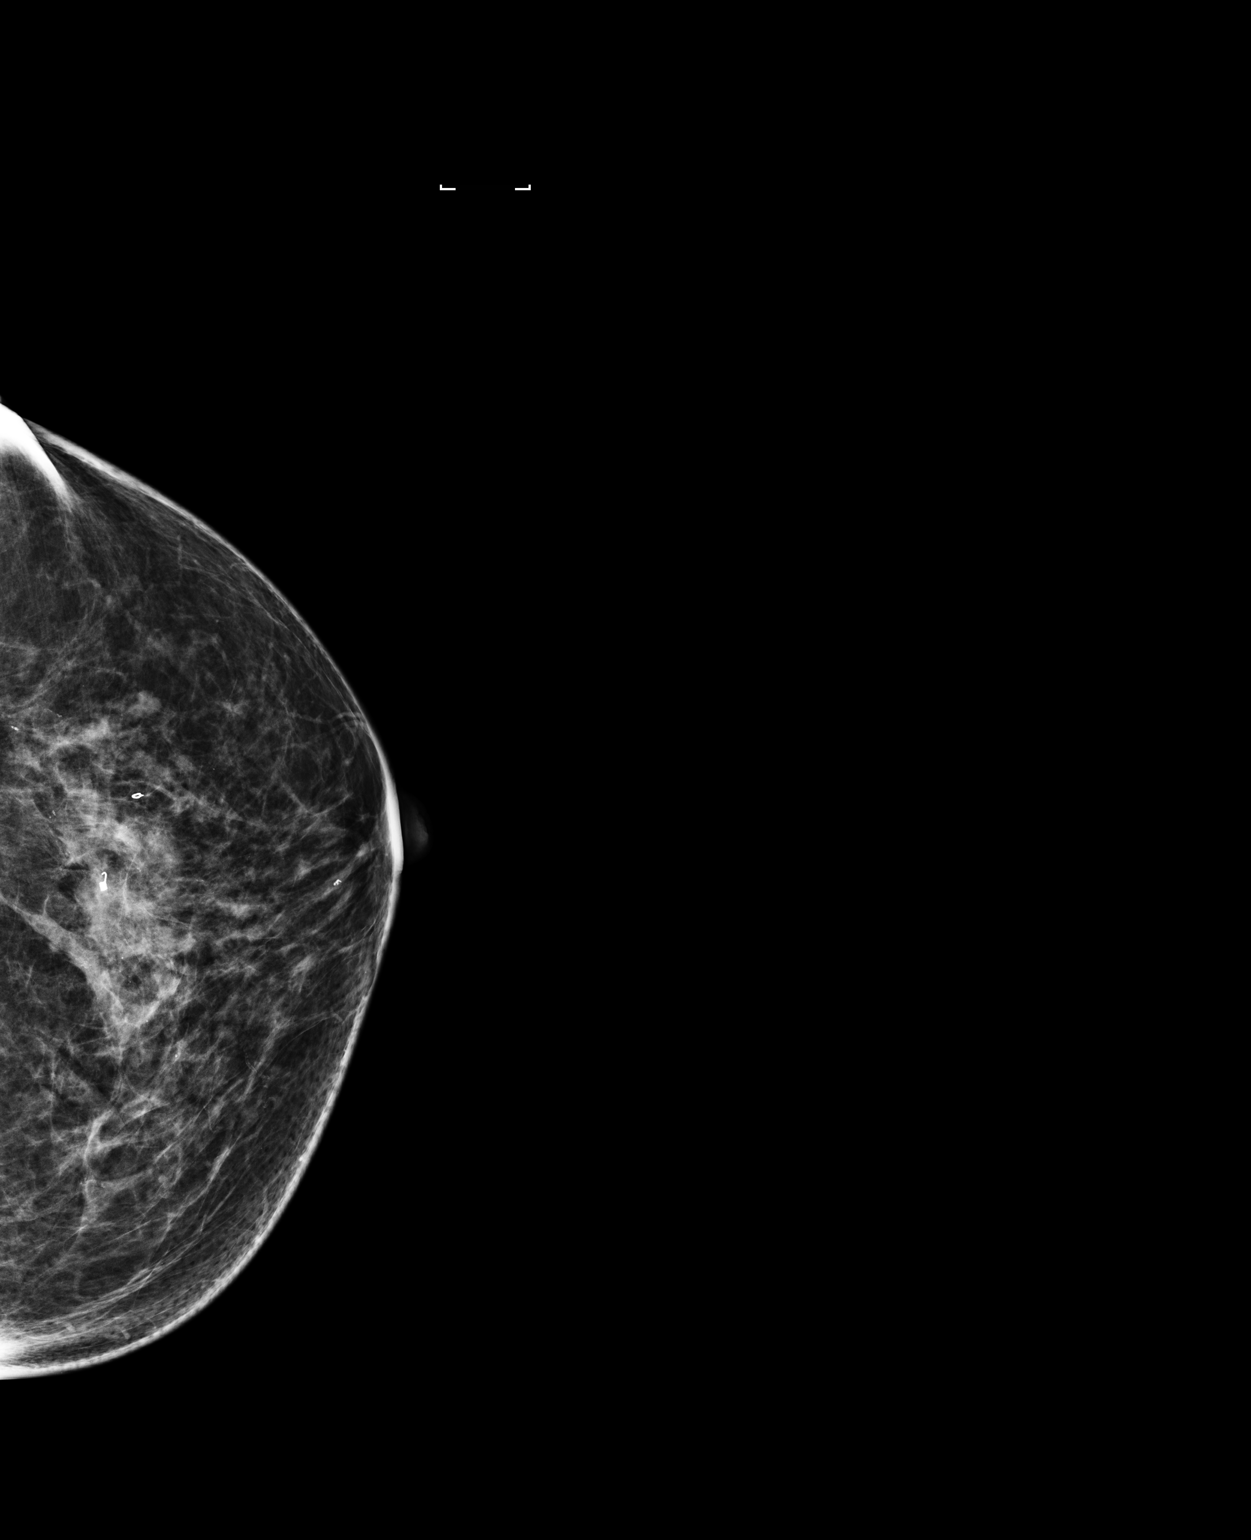

[L ML]
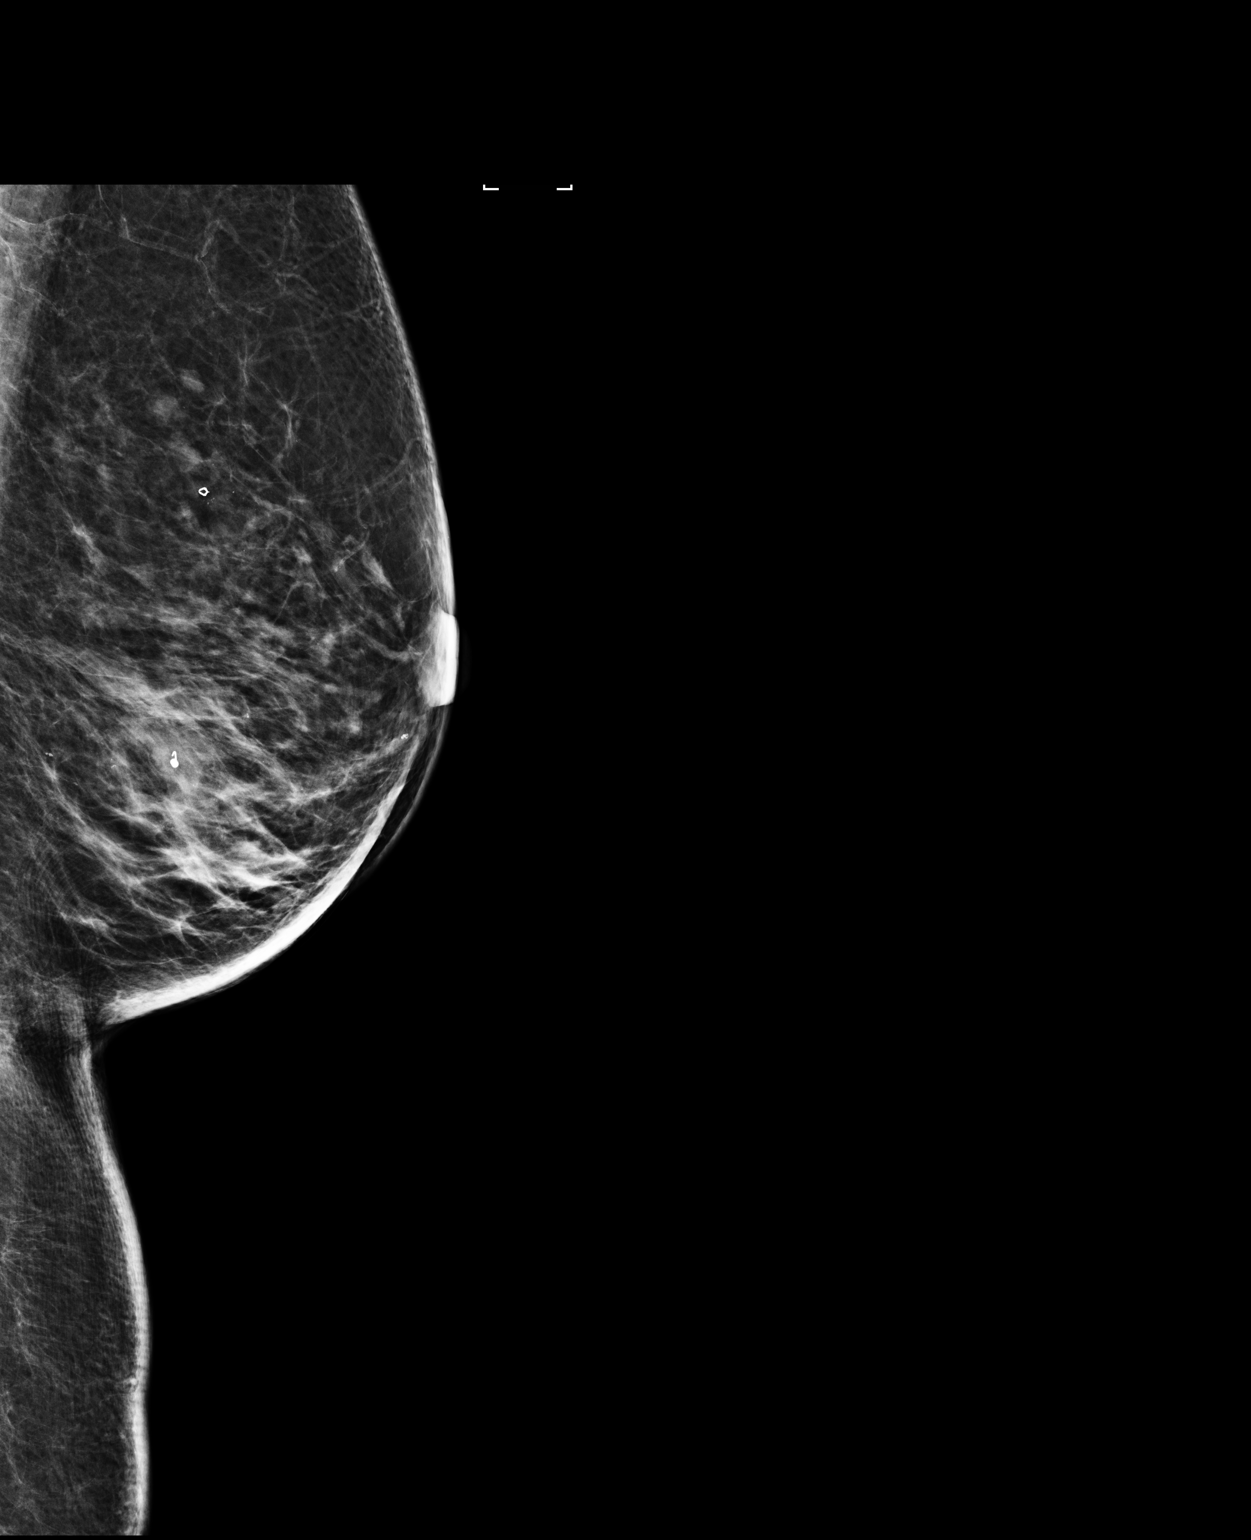

[2 of 2 positions shown; findings below may reference images not displayed]

FINDINGS: Mammographic images were obtained following ultrasound guided biopsy
of a left breast mass at [DATE], 3 cm from the nipple. Post biopsy
mammogram demonstrates the coil shaped biopsy marker in the expected
location of the mass in the lower, inner left breast.
IMPRESSION: Appropriate marker position as above.

Final Assessment: Post Procedure Mammograms for Marker Placement

## 2019-12-15 DIAGNOSIS — E782 Mixed hyperlipidemia: Secondary | ICD-10-CM | POA: Diagnosis not present

## 2019-12-22 DIAGNOSIS — I700 Atherosclerosis of aorta: Secondary | ICD-10-CM | POA: Diagnosis not present

## 2019-12-22 DIAGNOSIS — Z23 Encounter for immunization: Secondary | ICD-10-CM | POA: Diagnosis not present

## 2019-12-22 DIAGNOSIS — D500 Iron deficiency anemia secondary to blood loss (chronic): Secondary | ICD-10-CM | POA: Diagnosis not present

## 2019-12-22 DIAGNOSIS — Z0000 Encounter for general adult medical examination without abnormal findings: Secondary | ICD-10-CM | POA: Diagnosis not present

## 2019-12-22 DIAGNOSIS — Z86010 Personal history of colonic polyps: Secondary | ICD-10-CM | POA: Diagnosis not present

## 2019-12-22 DIAGNOSIS — M75101 Unspecified rotator cuff tear or rupture of right shoulder, not specified as traumatic: Secondary | ICD-10-CM | POA: Diagnosis not present

## 2019-12-24 ENCOUNTER — Other Ambulatory Visit: Payer: Self-pay | Admitting: Internal Medicine

## 2019-12-24 DIAGNOSIS — C50312 Malignant neoplasm of lower-inner quadrant of left female breast: Secondary | ICD-10-CM

## 2020-01-12 ENCOUNTER — Other Ambulatory Visit: Payer: Self-pay | Admitting: Surgery

## 2020-01-12 DIAGNOSIS — M75121 Complete rotator cuff tear or rupture of right shoulder, not specified as traumatic: Secondary | ICD-10-CM | POA: Diagnosis not present

## 2020-01-12 DIAGNOSIS — M7581 Other shoulder lesions, right shoulder: Secondary | ICD-10-CM | POA: Diagnosis not present

## 2020-01-19 ENCOUNTER — Ambulatory Visit
Admission: RE | Admit: 2020-01-19 | Discharge: 2020-01-19 | Disposition: A | Discharge status: Home / Self Care | Payer: Medicare HMO | Source: Ambulatory Visit | Attending: Internal Medicine | Admitting: Internal Medicine

## 2020-01-19 ENCOUNTER — Other Ambulatory Visit: Payer: Self-pay

## 2020-01-19 DIAGNOSIS — Z170 Estrogen receptor positive status [ER+]: Secondary | ICD-10-CM | POA: Insufficient documentation

## 2020-01-19 DIAGNOSIS — R928 Other abnormal and inconclusive findings on diagnostic imaging of breast: Secondary | ICD-10-CM | POA: Diagnosis not present

## 2020-01-19 DIAGNOSIS — C50312 Malignant neoplasm of lower-inner quadrant of left female breast: Secondary | ICD-10-CM | POA: Insufficient documentation

## 2020-01-28 DIAGNOSIS — I700 Atherosclerosis of aorta: Secondary | ICD-10-CM | POA: Diagnosis not present

## 2020-01-28 DIAGNOSIS — G459 Transient cerebral ischemic attack, unspecified: Secondary | ICD-10-CM | POA: Diagnosis not present

## 2020-01-28 DIAGNOSIS — I25119 Atherosclerotic heart disease of native coronary artery with unspecified angina pectoris: Secondary | ICD-10-CM | POA: Diagnosis not present

## 2020-01-28 DIAGNOSIS — E782 Mixed hyperlipidemia: Secondary | ICD-10-CM | POA: Diagnosis not present

## 2020-01-28 DIAGNOSIS — E538 Deficiency of other specified B group vitamins: Secondary | ICD-10-CM | POA: Diagnosis not present

## 2020-01-28 DIAGNOSIS — I341 Nonrheumatic mitral (valve) prolapse: Secondary | ICD-10-CM | POA: Diagnosis not present

## 2020-01-28 DIAGNOSIS — D500 Iron deficiency anemia secondary to blood loss (chronic): Secondary | ICD-10-CM | POA: Diagnosis not present

## 2020-01-28 DIAGNOSIS — I10 Essential (primary) hypertension: Secondary | ICD-10-CM | POA: Diagnosis not present

## 2020-01-29 ENCOUNTER — Other Ambulatory Visit: Payer: Self-pay

## 2020-01-29 ENCOUNTER — Ambulatory Visit
Admission: RE | Admit: 2020-01-29 | Discharge: 2020-01-29 | Disposition: A | Discharge status: Home / Self Care | Payer: Medicare HMO | Source: Ambulatory Visit | Attending: Surgery | Admitting: Surgery

## 2020-01-29 DIAGNOSIS — M7581 Other shoulder lesions, right shoulder: Secondary | ICD-10-CM | POA: Diagnosis not present

## 2020-01-29 DIAGNOSIS — M75121 Complete rotator cuff tear or rupture of right shoulder, not specified as traumatic: Secondary | ICD-10-CM | POA: Insufficient documentation

## 2020-01-29 DIAGNOSIS — M25511 Pain in right shoulder: Secondary | ICD-10-CM | POA: Diagnosis not present

## 2020-02-02 DIAGNOSIS — M75121 Complete rotator cuff tear or rupture of right shoulder, not specified as traumatic: Secondary | ICD-10-CM | POA: Diagnosis not present

## 2020-02-02 DIAGNOSIS — M7581 Other shoulder lesions, right shoulder: Secondary | ICD-10-CM | POA: Diagnosis not present

## 2020-03-11 ENCOUNTER — Inpatient Hospital Stay: Payer: Medicare HMO | Admitting: Oncology

## 2020-03-11 ENCOUNTER — Encounter: Payer: Self-pay | Admitting: Oncology

## 2020-03-11 NOTE — Progress Notes (Unsigned)
Patient called/ pre- screened for virtual appoinment today with oncologist. No concerns voiced ° °

## 2020-03-24 ENCOUNTER — Inpatient Hospital Stay: Payer: Medicare HMO | Attending: Oncology | Admitting: Oncology

## 2020-03-24 ENCOUNTER — Encounter: Payer: Self-pay | Admitting: Oncology

## 2020-03-24 ENCOUNTER — Other Ambulatory Visit: Payer: Self-pay | Admitting: *Deleted

## 2020-03-24 DIAGNOSIS — Z08 Encounter for follow-up examination after completed treatment for malignant neoplasm: Secondary | ICD-10-CM | POA: Diagnosis not present

## 2020-03-24 DIAGNOSIS — Z853 Personal history of malignant neoplasm of breast: Secondary | ICD-10-CM | POA: Diagnosis not present

## 2020-03-25 ENCOUNTER — Telehealth: Payer: Medicare HMO | Admitting: Oncology

## 2020-03-28 NOTE — Progress Notes (Signed)
I connected with Doris Barnes on 03/28/20 at 10:00 AM EST by video enabled telemedicine visit and verified that I am speaking with the correct person using two identifiers.  There were problems during video call and therefore switched to phone call   I discussed the limitations, risks, security and privacy concerns of performing an evaluation and management service by telemedicine and the availability of in-person appointments. I also discussed with the patient that there may be a patient responsible charge related to this service. The patient expressed understanding and agreed to proceed.  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 Tumor was grade 314 mm. Lymph nodes were negative for malignancy. She started adjuvant Taxol Herceptin chemotherapy in October 2018 and has completed 1 year of adjuvant Herceptin. She is also completed adjuvant radiation. She could not tolerate AI and did not wish to continue hormone therapy.   Interval history Patient reports doing well and denies any breast concerns at this time. She has ongoing right shoulder pain and underwent MRI which showed possible biceps rupture   Review of Systems  Constitutional: Negative for chills, fever, malaise/fatigue and weight loss.  HENT: Negative for congestion, ear discharge and nosebleeds.   Eyes: Negative for blurred vision.  Respiratory: Negative for cough, hemoptysis, sputum production, shortness of breath and wheezing.   Cardiovascular: Negative for chest pain, palpitations, orthopnea and claudication.  Gastrointestinal: Negative for abdominal pain, blood in stool, constipation, diarrhea,  heartburn, melena, nausea and vomiting.  Genitourinary: Negative for dysuria, flank pain, frequency, hematuria and urgency.  Musculoskeletal: Positive for joint pain. Negative for back pain and myalgias.  Skin: Negative for rash.  Neurological: Negative for dizziness, tingling, focal weakness, seizures, weakness and headaches.  Endo/Heme/Allergies: Does not bruise/bleed easily.  Psychiatric/Behavioral: Negative for depression and suicidal ideas. The patient does not have insomnia.     Allergies  Allergen Reactions  . Imipramine Pamoate Shortness Of Breath  . Bisphosphonates Nausea Only  . Epinephrine Hcl (Nasal) Other (See Comments)    Difficulty breathing  . Prednisone Palpitations  . Sulfa Antibiotics Rash    Face turns red and stings    Past Medical History:  Diagnosis Date  . Anemia   . Anxiety   . Asthma   . Breast cancer ([place]) 12/14/2016   left breast  . Cancer ([place]) skin  . Chronic vulvitis   . Colon polyp   . Cystocele   . Diverticulitis   . Diverticulitis   . Diverticulosis   . Diverticulosis   . Dyspareunia, female   . Dyspnea   . Dysrhythmia   . Fibrocystic disease of both breasts   . Gastritis   . GERD (gastroesophageal reflux disease)    gastritis also  . Hemorrhoids   . History of hiatal hernia   . Hypercholesteremia   . Hypertension   . IBS (irritable bowel syndrome)   . IC (interstitial cystitis)   . Migraine   . Mitral valve prolapse   . OP (osteoporosis)   . Personal history of chemotherapy   . Personal history of radiation therapy   . Positional vertigo   . Rectocele   . Squamous cell carcinoma   . Stroke [facility])  TIA  . Vaginal atrophy     Past Surgical History:  Procedure Laterality Date  . APPENDECTOMY    . BREAST BIOPSY Left 1998   core bx- neg  . BREAST BIOPSY Left 2007   neg  . BREAST BIOPSY Left 12/14/2016   left breast US core positive  . BREAST EXCISIONAL BIOPSY Left   . BREAST LUMPECTOMY Left 01/01/2017    INVASIVE  MAMMARY CARCINOMA/Grade 3  . CARDIAC CATHETERIZATION N/A 01/18/2015   Procedure: Left Heart Cath and Coronary Angiography;  Surgeon: Teodoro Spray, MD;  Location: [place] CV LAB;  Service: Cardiovascular;  Laterality: N/A;  . CATARACT EXTRACTION W/PHACO Right 12/15/2015   Procedure: CATARACT EXTRACTION PHACO AND INTRAOCULAR LENS PLACEMENT (IOC);  Surgeon: Estill Cotta, MD;  Location: [facility] ORS;  Service: Ophthalmology;  Laterality: Right;  [place] 01:20 AP% 23.9 CDE 35.49 Fluid pack lot # [id number]  . CATARACT EXTRACTION W/PHACO Left 12/29/2015   Procedure: CATARACT EXTRACTION PHACO AND INTRAOCULAR LENS PLACEMENT ([place]);  Surgeon: Estill Cotta, MD;  Location: [facility] ORS;  Service: Ophthalmology;  Laterality: Left;  [place]  01:20 AP% 25.1 CDE 32.86 Fluid pack lot # [id number]  . COLON SURGERY    . COLONOSCOPY WITH PROPOFOL N/A 02/21/2016   Procedure: COLONOSCOPY WITH PROPOFOL;  Surgeon: Manya Silvas, MD;  Location: [facility] ENDOSCOPY;  Service: Endoscopy;  Laterality: N/A;  . COLONOSCOPY WITH PROPOFOL N/A 01/03/2019   Procedure: COLONOSCOPY WITH PROPOFOL;  Surgeon: Lollie Sails, MD;  Location: [facility] ENDOSCOPY;  Service: Endoscopy;  Laterality: N/A;  . CORONARY ANGIOPLASTY    . DILATION AND CURETTAGE OF UTERUS    . ESOPHAGOGASTRODUODENOSCOPY (EGD) WITH PROPOFOL N/A 11/23/2017   Procedure: ESOPHAGOGASTRODUODENOSCOPY (EGD) WITH PROPOFOL;  Surgeon: Manya Silvas, MD;  Location: [facility] ENDOSCOPY;  Service: Endoscopy;  Laterality: N/A;  . OOPHORECTOMY Bilateral   . PARTIAL MASTECTOMY WITH NEEDLE LOCALIZATION Left 01/01/2017   Procedure: PARTIAL MASTECTOMY WITH NEEDLE LOCALIZATION;  Surgeon: Herbert Pun, MD;  Location: [facility] ORS;  Service: General;  Laterality: Left;  . PORTACATH PLACEMENT Right 01/01/2017   Procedure: INSERTION PORT-A-CATH;  Surgeon: Herbert Pun, MD;  Location: [facility] ORS;  Service: General;  Laterality: Right;  . SENTINEL NODE BIOPSY Left 01/01/2017    Procedure: SENTINEL NODE BIOPSY;  Surgeon: Herbert Pun, MD;  Location: [facility] ORS;  Service: General;  Laterality: Left;  . TONSILLECTOMY    . VAGINAL HYSTERECTOMY      Social History   Socioeconomic History  . Marital status: Married    Spouse name: Not on file  . Number of children: Not on file  . Years of education: Not on file  . Highest education level: Not on file  Occupational History  . Not on file  Tobacco Use  . Smoking status: Former Smoker    Packs/day: 1.00    Years: 33.00    Pack years: 33.00    Quit date: 10/17/1987    Years since quitting: 32.4  . Smokeless tobacco: Never Used  Vaping Use  . Vaping Use: Never used  Substance and Sexual Activity  . Alcohol use: No  . Drug use: No  . Sexual activity: Yes  Other Topics Concern  . Not on file  Social History Narrative  . Not on file   Social Determinants of Health   Financial Resource Strain: Not on file  Food Insecurity: Not on file  Transportation Needs: Not on file  Physical Activity: Not on file  Stress: Not on file  Social Connections: Not on file  Intimate Partner Violence: Not on file    Family History  Problem Relation Age of Onset  . Diabetes Mother   . Diabetes Father   . Colon cancer Sister   . Diabetes Sister   . Diabetes Maternal Aunt   . Colon cancer Paternal Grandfather   . Lung cancer Brother   . Breast cancer Neg Hx   . Ovarian cancer Neg Hx      Current Outpatient Medications:  .  acetaminophen (TYLENOL) 325 MG tablet, Take 650 mg by mouth every 6 (six) hours as needed (for headaches.). , Disp: , Rfl:  .  aspirin EC 81 MG tablet, Take 81 mg by mouth daily. Swallow whole., Disp: , Rfl:  .  Calcium Carbonate-Vitamin D (CALCIUM 600-D) 600-400 MG-UNIT tablet, Take 1 tablet by mouth daily., Disp: , Rfl:  .  FLUoxetine (PROZAC) 40 MG capsule, Take 40 mg by mouth at bedtime., Disp: , Rfl:  .  furosemide (LASIX) 20 MG tablet, Take 20 mg by mouth daily., Disp: , Rfl:  .   gabapentin (NEURONTIN) 100 MG capsule, Take 100 mg by mouth 5 (five) times daily., Disp: , Rfl:  .  lansoprazole (PREVACID) 30 MG capsule, Take 30 mg by mouth daily before breakfast. , Disp: , Rfl:  .  loratadine (CLARITIN) 10 MG tablet, Take 10 mg by mouth daily as needed for allergies., Disp: , Rfl:  .  Magnesium 250 MG TABS, Take 250 mg by mouth daily., Disp: , Rfl:  .  Multiple Vitamins-Minerals (PRESERVISION AREDS 2) CAPS, Take 1 capsule by mouth 2 (two) times daily. , Disp: , Rfl:  .  polyethylene glycol (MIRALAX / GLYCOLAX) packet, Take 17 g by mouth daily as needed. , Disp: , Rfl:  .  propranolol (INDERAL) 40 MG tablet, Take 40 mg by mouth daily., Disp: , Rfl:  .  simvastatin (ZOCOR) 20 MG tablet, Take 20 mg by mouth daily at 8 pm., Disp: , Rfl:  .  spironolactone (ALDACTONE) 25 MG tablet, Take 25 mg by mouth daily., Disp: , Rfl:  .  telmisartan (MICARDIS) 80 MG tablet, Take 40 mg by mouth daily with breakfast., Disp: , Rfl:  .  amoxicillin (AMOXIL) 500 MG capsule, Take 4 capsules by mouth as needed. For dental procedures (Patient not taking: No sig reported), Disp: , Rfl:   No results found.  No images are attached to the encounter.   CMP Latest Ref Rng & Units 01/22/2018  Glucose 70 - 99 mg/dL 127(H)  BUN 8 - 23 mg/dL 11  Creatinine 0.44 - 1.00 mg/dL 0.59  Sodium 135 - 145 mmol/L 132(L)  Potassium 3.5 - 5.1 mmol/L 3.9  Chloride 98 - 111 mmol/L 97(L)  CO2 22 - 32 mmol/L 30  Calcium 8.9 - 10.3 mg/dL 9.1  Total Protein 6.5 - 8.1 g/dL 6.6  Total Bilirubin 0.3 - 1.2 mg/dL 0.5  Alkaline Phos 38 - 126 U/L 72  AST 15 - 41 U/L 23  ALT 0 - 44 U/L 18   CBC Latest Ref Rng & Units 01/22/2018  WBC 4.0 - 10.5 K/uL 4.7  Hemoglobin 12.0 - 15.0 g/dL 10.7(L)  Hematocrit 36.0 - 46.0 % 33.4(L)  Platelets 150 - 400 K/uL 182    Assessment and plan:Patient is a 80 yr old female with pathologicalprognostic stage IA pT1c1p N0 cM0invasive mammary carcinoma of the left breast ER negative PR  positive and HER-2 +3 on IHC s/p lumpectomy s/p adjuvant chemotherapy.  From a breast cancer standpoint patient is doing  well with no concerning symptoms of recurrence.  Her recent mammogram from October 2021 was normal.  I will see her back in 6 months for a routine breast exam  Follow-up instructions:as above  I discussed the assessment and treatment plan with the patient. The patient was provided an opportunity to ask questions and all were answered. The patient agreed with the plan and demonstrated an understanding of the instructions.   The patient was advised to call back or seek an in-person evaluation if the symptoms worsen or if the condition fails to improve as anticipated.   Visit Diagnosis: 1. Encounter for follow-up surveillance of breast cancer     Dr. Randa Evens, MD, MPH [facility] at [facility] Tel- [phone] 03/28/2020 7:26 PM

## 2020-03-29 DIAGNOSIS — D649 Anemia, unspecified: Secondary | ICD-10-CM | POA: Diagnosis not present

## 2020-03-29 DIAGNOSIS — K219 Gastro-esophageal reflux disease without esophagitis: Secondary | ICD-10-CM | POA: Diagnosis not present

## 2020-03-29 DIAGNOSIS — Z86010 Personal history of colonic polyps: Secondary | ICD-10-CM | POA: Diagnosis not present

## 2020-03-29 DIAGNOSIS — K2960 Other gastritis without bleeding: Secondary | ICD-10-CM | POA: Diagnosis not present

## 2020-05-10 ENCOUNTER — Other Ambulatory Visit: Payer: Self-pay | Admitting: Surgery

## 2020-05-10 DIAGNOSIS — M545 Low back pain, unspecified: Secondary | ICD-10-CM | POA: Diagnosis not present

## 2020-05-10 DIAGNOSIS — M8448XG Pathological fracture, other site, subsequent encounter for fracture with delayed healing: Secondary | ICD-10-CM

## 2020-05-10 DIAGNOSIS — M47816 Spondylosis without myelopathy or radiculopathy, lumbar region: Secondary | ICD-10-CM | POA: Diagnosis not present

## 2020-05-10 DIAGNOSIS — G8929 Other chronic pain: Secondary | ICD-10-CM

## 2020-05-10 DIAGNOSIS — M5441 Lumbago with sciatica, right side: Secondary | ICD-10-CM

## 2020-05-22 ENCOUNTER — Ambulatory Visit
Admission: RE | Admit: 2020-05-22 | Discharge: 2020-05-22 | Disposition: A | Discharge status: Home / Self Care | Payer: Medicare HMO | Source: Ambulatory Visit | Attending: Surgery | Admitting: Surgery

## 2020-05-22 ENCOUNTER — Other Ambulatory Visit: Payer: Self-pay

## 2020-05-22 DIAGNOSIS — M5441 Lumbago with sciatica, right side: Secondary | ICD-10-CM | POA: Insufficient documentation

## 2020-05-22 DIAGNOSIS — M8448XG Pathological fracture, other site, subsequent encounter for fracture with delayed healing: Secondary | ICD-10-CM | POA: Diagnosis not present

## 2020-05-22 DIAGNOSIS — M545 Low back pain, unspecified: Secondary | ICD-10-CM | POA: Diagnosis not present

## 2020-05-22 DIAGNOSIS — G8929 Other chronic pain: Secondary | ICD-10-CM | POA: Diagnosis not present

## 2020-05-22 DIAGNOSIS — R102 Pelvic and perineal pain: Secondary | ICD-10-CM | POA: Diagnosis not present

## 2020-05-22 DIAGNOSIS — M47816 Spondylosis without myelopathy or radiculopathy, lumbar region: Secondary | ICD-10-CM | POA: Insufficient documentation

## 2020-05-22 DIAGNOSIS — K5730 Diverticulosis of large intestine without perforation or abscess without bleeding: Secondary | ICD-10-CM | POA: Diagnosis not present

## 2020-05-22 DIAGNOSIS — Z90710 Acquired absence of both cervix and uterus: Secondary | ICD-10-CM | POA: Diagnosis not present

## 2020-05-31 DIAGNOSIS — M5441 Lumbago with sciatica, right side: Secondary | ICD-10-CM | POA: Diagnosis not present

## 2020-05-31 DIAGNOSIS — M47816 Spondylosis without myelopathy or radiculopathy, lumbar region: Secondary | ICD-10-CM | POA: Diagnosis not present

## 2020-05-31 DIAGNOSIS — G5701 Lesion of sciatic nerve, right lower limb: Secondary | ICD-10-CM | POA: Diagnosis not present

## 2020-06-14 DIAGNOSIS — E782 Mixed hyperlipidemia: Secondary | ICD-10-CM | POA: Diagnosis not present

## 2020-06-14 DIAGNOSIS — D500 Iron deficiency anemia secondary to blood loss (chronic): Secondary | ICD-10-CM | POA: Diagnosis not present

## 2020-06-21 DIAGNOSIS — I2510 Atherosclerotic heart disease of native coronary artery without angina pectoris: Secondary | ICD-10-CM | POA: Diagnosis not present

## 2020-06-21 DIAGNOSIS — M542 Cervicalgia: Secondary | ICD-10-CM | POA: Diagnosis not present

## 2020-06-21 DIAGNOSIS — Z0000 Encounter for general adult medical examination without abnormal findings: Secondary | ICD-10-CM | POA: Diagnosis not present

## 2020-06-21 DIAGNOSIS — I700 Atherosclerosis of aorta: Secondary | ICD-10-CM | POA: Diagnosis not present

## 2020-06-21 DIAGNOSIS — Z853 Personal history of malignant neoplasm of breast: Secondary | ICD-10-CM | POA: Diagnosis not present

## 2020-06-21 DIAGNOSIS — D509 Iron deficiency anemia, unspecified: Secondary | ICD-10-CM | POA: Diagnosis not present

## 2020-06-24 ENCOUNTER — Other Ambulatory Visit: Payer: Self-pay | Admitting: Surgery

## 2020-06-29 ENCOUNTER — Other Ambulatory Visit: Payer: Medicare HMO

## 2020-07-05 DIAGNOSIS — D500 Iron deficiency anemia secondary to blood loss (chronic): Secondary | ICD-10-CM | POA: Diagnosis not present

## 2020-07-06 ENCOUNTER — Other Ambulatory Visit: Admission: RE | Admit: 2020-07-06 | Payer: Medicare HMO | Source: Ambulatory Visit

## 2020-07-06 ENCOUNTER — Inpatient Hospital Stay: Payer: Medicare HMO | Attending: Oncology

## 2020-07-06 ENCOUNTER — Inpatient Hospital Stay (HOSPITAL_BASED_OUTPATIENT_CLINIC_OR_DEPARTMENT_OTHER): Payer: Medicare HMO | Admitting: Oncology

## 2020-07-06 ENCOUNTER — Encounter: Payer: Self-pay | Admitting: Oncology

## 2020-07-06 VITALS — BP 128/50 | HR 64 | Temp 97.8°F | Resp 16 | Wt 123.0 lb

## 2020-07-06 DIAGNOSIS — C50912 Malignant neoplasm of unspecified site of left female breast: Secondary | ICD-10-CM | POA: Insufficient documentation

## 2020-07-06 DIAGNOSIS — Z801 Family history of malignant neoplasm of trachea, bronchus and lung: Secondary | ICD-10-CM | POA: Diagnosis not present

## 2020-07-06 DIAGNOSIS — Z08 Encounter for follow-up examination after completed treatment for malignant neoplasm: Secondary | ICD-10-CM

## 2020-07-06 DIAGNOSIS — Z79899 Other long term (current) drug therapy: Secondary | ICD-10-CM | POA: Diagnosis not present

## 2020-07-06 DIAGNOSIS — Z853 Personal history of malignant neoplasm of breast: Secondary | ICD-10-CM

## 2020-07-06 DIAGNOSIS — Z9221 Personal history of antineoplastic chemotherapy: Secondary | ICD-10-CM | POA: Diagnosis not present

## 2020-07-06 DIAGNOSIS — Z87891 Personal history of nicotine dependence: Secondary | ICD-10-CM | POA: Insufficient documentation

## 2020-07-06 DIAGNOSIS — Z8673 Personal history of transient ischemic attack (TIA), and cerebral infarction without residual deficits: Secondary | ICD-10-CM | POA: Diagnosis not present

## 2020-07-06 DIAGNOSIS — K589 Irritable bowel syndrome without diarrhea: Secondary | ICD-10-CM | POA: Insufficient documentation

## 2020-07-06 DIAGNOSIS — Z171 Estrogen receptor negative status [ER-]: Secondary | ICD-10-CM | POA: Insufficient documentation

## 2020-07-06 DIAGNOSIS — I341 Nonrheumatic mitral (valve) prolapse: Secondary | ICD-10-CM | POA: Insufficient documentation

## 2020-07-06 DIAGNOSIS — E7800 Pure hypercholesterolemia, unspecified: Secondary | ICD-10-CM | POA: Diagnosis not present

## 2020-07-06 DIAGNOSIS — K219 Gastro-esophageal reflux disease without esophagitis: Secondary | ICD-10-CM | POA: Insufficient documentation

## 2020-07-06 DIAGNOSIS — Z923 Personal history of irradiation: Secondary | ICD-10-CM | POA: Diagnosis not present

## 2020-07-06 DIAGNOSIS — Z7982 Long term (current) use of aspirin: Secondary | ICD-10-CM | POA: Diagnosis not present

## 2020-07-06 DIAGNOSIS — Z800 Family history of malignant neoplasm of digestive organs: Secondary | ICD-10-CM | POA: Insufficient documentation

## 2020-07-06 DIAGNOSIS — Z86010 Personal history of colonic polyps: Secondary | ICD-10-CM | POA: Insufficient documentation

## 2020-07-06 DIAGNOSIS — I10 Essential (primary) hypertension: Secondary | ICD-10-CM | POA: Insufficient documentation

## 2020-07-06 LAB — CBC WITH DIFFERENTIAL/PLATELET
Abs Immature Granulocytes: 0.03 10*3/uL (ref 0.00–0.07)
Basophils Absolute: 0 10*3/uL (ref 0.0–0.1)
Basophils Relative: 0 %
Eosinophils Absolute: 0.1 10*3/uL (ref 0.0–0.5)
Eosinophils Relative: 1 %
HCT: 35.5 % — ABNORMAL LOW (ref 36.0–46.0)
Hemoglobin: 11.4 g/dL — ABNORMAL LOW (ref 12.0–15.0)
Immature Granulocytes: 0 %
Lymphocytes Relative: 17 %
Lymphs Abs: 1.5 10*3/uL (ref 0.7–4.0)
MCH: 27.6 pg (ref 26.0–34.0)
MCHC: 32.1 g/dL (ref 30.0–36.0)
MCV: 86 fL (ref 80.0–100.0)
Monocytes Absolute: 0.9 10*3/uL (ref 0.1–1.0)
Monocytes Relative: 10 %
Neutro Abs: 6.3 10*3/uL (ref 1.7–7.7)
Neutrophils Relative %: 72 %
Platelets: 265 10*3/uL (ref 150–400)
RBC: 4.13 MIL/uL (ref 3.87–5.11)
RDW: 13.5 % (ref 11.5–15.5)
WBC: 8.9 10*3/uL (ref 4.0–10.5)
nRBC: 0 % (ref 0.0–0.2)

## 2020-07-06 LAB — COMPREHENSIVE METABOLIC PANEL
ALT: 24 U/L (ref 0–44)
AST: 23 U/L (ref 15–41)
Albumin: 3.9 g/dL (ref 3.5–5.0)
Alkaline Phosphatase: 77 U/L (ref 38–126)
Anion gap: 5 (ref 5–15)
BUN: 16 mg/dL (ref 8–23)
CO2: 30 mmol/L (ref 22–32)
Calcium: 9.2 mg/dL (ref 8.9–10.3)
Chloride: 98 mmol/L (ref 98–111)
Creatinine, Ser: 0.83 mg/dL (ref 0.44–1.00)
GFR, Estimated: >60 mL/min (ref >60)
Glucose, Bld: 108 mg/dL — ABNORMAL HIGH (ref 70–99)
Potassium: 5 mmol/L (ref 3.5–5.1)
Sodium: 133 mmol/L — ABNORMAL LOW (ref 135–145)
Total Bilirubin: 0.6 mg/dL (ref 0.3–1.2)
Total Protein: 6.9 g/dL (ref 6.5–8.1)

## 2020-07-07 NOTE — Progress Notes (Signed)
Hematology/Oncology Consult note [facility]  Telephone:([phone] [phone]  Patient Care Team: Rusty Aus, MD as PCP - General (Internal Medicine) Sindy Guadeloupe, MD as Consulting Physician (Oncology) Herbert Pun, MD as Consulting Physician (General Surgery) Rico Junker, RN as Oncology Nurse Navigator Noreene Filbert, MD as Referring Physician (Radiation Oncology)   Name of the patient: Doris Barnes  [id number]  [birth year]   Date of visit: 07/07/20  Diagnosis- Stage IA invasive mammary carcinoma of the left breast ER negative, PR weakly positive and HER-2 positive  Chief complaint/ Reason for visit-routine follow-up of breast cancer  Heme/Onc history: Patient is a 80 year old female who was diagnosed with invasive mammary carcinoma of the left breast ER negative, PR 11 to 50% positive and HER-2/neu positive. Tumor was grade 314 mm. Lymph nodes were negative for malignancy. She started adjuvant Taxol Herceptin chemotherapy in October 2018 and has completed 1 year of adjuvant Herceptin. She is also completed adjuvant radiation. She could not tolerate AI and did not wish to continue hormone therapy.   Interval history-patient had symptoms of low back pain and underwent MRI of the lumbar spine and pelvis which did not show any acute pathology.  She also has issues with the right rotator cuff pain and injuries which is getting better.  Denies other complaints at this time  ECOG PS- 1 Pain scale- 0   Review of systems- ROS    Allergies  Allergen Reactions  . Imipramine Pamoate Shortness Of Breath  . Bisphosphonates Nausea Only  . Epinephrine Hcl (Nasal) Other (See Comments)    Difficulty breathing  . Prednisone Palpitations  . Sulfa Antibiotics Rash    Face turns red and stings     Past Medical History:  Diagnosis Date  . Anemia   . Anxiety   . Asthma   . Breast cancer ([place]) 12/14/2016   left breast  .  Cancer ([place]) skin  . Chronic vulvitis   . Colon polyp   . Cystocele   . Diverticulitis   . Diverticulitis   . Diverticulosis   . Diverticulosis   . Dyspareunia, female   . Dyspnea   . Dysrhythmia   . Fibrocystic disease of both breasts   . Gastritis   . GERD (gastroesophageal reflux disease)    gastritis also  . Hemorrhoids   . History of hiatal hernia   . Hypercholesteremia   . Hypertension   . IBS (irritable bowel syndrome)   . IC (interstitial cystitis)   . Migraine   . Mitral valve prolapse   . OP (osteoporosis)   . Personal history of chemotherapy   . Personal history of radiation therapy   . Positional vertigo   . Rectocele   . Squamous cell carcinoma   . Stroke [facility])    TIA  . Vaginal atrophy      Past Surgical History:  Procedure Laterality Date  . APPENDECTOMY    . BREAST BIOPSY Left 1998   core bx- neg  . BREAST BIOPSY Left 2007   neg  . BREAST BIOPSY Left 12/14/2016   left breast US core positive  . BREAST EXCISIONAL BIOPSY Left   . BREAST LUMPECTOMY Left 01/01/2017    INVASIVE MAMMARY CARCINOMA/Grade 3  . CARDIAC CATHETERIZATION N/A 01/18/2015   Procedure: Left Heart Cath and Coronary Angiography;  Surgeon: Teodoro Spray, MD;  Location: [place] CV LAB;  Service: Cardiovascular;  Laterality: N/A;  . CATARACT EXTRACTION W/PHACO Right 12/15/2015  Procedure: CATARACT EXTRACTION PHACO AND INTRAOCULAR LENS PLACEMENT (IOC);  Surgeon: Estill Cotta, MD;  Location: [facility] ORS;  Service: Ophthalmology;  Laterality: Right;  [place] 01:20 AP% 23.9 CDE 35.49 Fluid pack lot # [id number]  . CATARACT EXTRACTION W/PHACO Left 12/29/2015   Procedure: CATARACT EXTRACTION PHACO AND INTRAOCULAR LENS PLACEMENT ([place]);  Surgeon: Estill Cotta, MD;  Location: [facility] ORS;  Service: Ophthalmology;  Laterality: Left;  [place]  01:20 AP% 25.1 CDE 32.86 Fluid pack lot # [id number]  . COLON SURGERY    . COLONOSCOPY WITH PROPOFOL N/A 02/21/2016   Procedure: COLONOSCOPY WITH  PROPOFOL;  Surgeon: Manya Silvas, MD;  Location: [facility] ENDOSCOPY;  Service: Endoscopy;  Laterality: N/A;  . COLONOSCOPY WITH PROPOFOL N/A 01/03/2019   Procedure: COLONOSCOPY WITH PROPOFOL;  Surgeon: Lollie Sails, MD;  Location: [facility] ENDOSCOPY;  Service: Endoscopy;  Laterality: N/A;  . CORONARY ANGIOPLASTY    . DILATION AND CURETTAGE OF UTERUS    . ESOPHAGOGASTRODUODENOSCOPY (EGD) WITH PROPOFOL N/A 11/23/2017   Procedure: ESOPHAGOGASTRODUODENOSCOPY (EGD) WITH PROPOFOL;  Surgeon: Manya Silvas, MD;  Location: [facility] ENDOSCOPY;  Service: Endoscopy;  Laterality: N/A;  . OOPHORECTOMY Bilateral   . PARTIAL MASTECTOMY WITH NEEDLE LOCALIZATION Left 01/01/2017   Procedure: PARTIAL MASTECTOMY WITH NEEDLE LOCALIZATION;  Surgeon: Herbert Pun, MD;  Location: [facility] ORS;  Service: General;  Laterality: Left;  . PORTACATH PLACEMENT Right 01/01/2017   Procedure: INSERTION PORT-A-CATH;  Surgeon: Herbert Pun, MD;  Location: [facility] ORS;  Service: General;  Laterality: Right;  . SENTINEL NODE BIOPSY Left 01/01/2017   Procedure: SENTINEL NODE BIOPSY;  Surgeon: Herbert Pun, MD;  Location: [facility] ORS;  Service: General;  Laterality: Left;  . TONSILLECTOMY    . VAGINAL HYSTERECTOMY      Social History   Socioeconomic History  . Marital status: Married    Spouse name: Not on file  . Number of children: Not on file  . Years of education: Not on file  . Highest education level: Not on file  Occupational History  . Not on file  Tobacco Use  . Smoking status: Former Smoker    Packs/day: 1.00    Years: 33.00    Pack years: 33.00    Quit date: 10/17/1987    Years since quitting: 32.7  . Smokeless tobacco: Never Used  Vaping Use  . Vaping Use: Never used  Substance and Sexual Activity  . Alcohol use: No  . Drug use: No  . Sexual activity: Yes  Other Topics Concern  . Not on file  Social History Narrative  . Not on file   Social Determinants of Health   Financial Resource  Strain: Not on file  Food Insecurity: Not on file  Transportation Needs: Not on file  Physical Activity: Not on file  Stress: Not on file  Social Connections: Not on file  Intimate Partner Violence: Not on file    Family History  Problem Relation Age of Onset  . Diabetes Mother   . Diabetes Father   . Colon cancer Sister   . Diabetes Sister   . Diabetes Maternal Aunt   . Colon cancer Paternal Grandfather   . Lung cancer Brother   . Breast cancer Neg Hx   . Ovarian cancer Neg Hx      Current Outpatient Medications:  .  acetaminophen (TYLENOL) 325 MG tablet, Take 650 mg by mouth every 6 (six) hours as needed for headache., Disp: , Rfl:  .  acidophilus (RISAQUAD) CAPS capsule, Take 2 capsules by mouth daily.,  Disp: , Rfl:  .  aspirin EC 81 MG tablet, Take 81 mg by mouth daily. Swallow whole., Disp: , Rfl:  .  Calcium Carbonate-Vitamin D 600-400 MG-UNIT tablet, Take 1 tablet by mouth 2 (two) times daily., Disp: , Rfl:  .  FLUoxetine (PROZAC) 40 MG capsule, Take 40 mg by mouth daily., Disp: , Rfl:  .  furosemide (LASIX) 20 MG tablet, Take 20 mg by mouth daily., Disp: , Rfl:  .  gabapentin (NEURONTIN) 100 MG capsule, Take 300 mg by mouth at bedtime., Disp: , Rfl:  .  lansoprazole (PREVACID) 30 MG capsule, Take 30 mg by mouth daily before breakfast. , Disp: , Rfl:  .  Magnesium 250 MG TABS, Take 250 mg by mouth daily., Disp: , Rfl:  .  Multiple Vitamins-Minerals (PRESERVISION AREDS 2) CAPS, Take 1 capsule by mouth 2 (two) times daily. , Disp: , Rfl:  .  polyethylene glycol (MIRALAX / GLYCOLAX) packet, Take 17 g by mouth daily as needed for moderate constipation., Disp: , Rfl:  .  propranolol (INDERAL) 40 MG tablet, Take 40 mg by mouth 2 (two) times daily., Disp: , Rfl:  .  simvastatin (ZOCOR) 20 MG tablet, Take 20 mg by mouth daily at 8 pm., Disp: , Rfl:  .  spironolactone (ALDACTONE) 25 MG tablet, Take 25 mg by mouth daily., Disp: , Rfl:  .  telmisartan (MICARDIS) 80 MG tablet, Take  40 mg by mouth daily with breakfast., Disp: , Rfl:  .  amoxicillin (AMOXIL) 500 MG tablet, Take 2,000 mg by mouth See admin instructions. Take 2000 mg by mouth one hour prior to dental procedures (Patient not taking: Reported on 07/06/2020), Disp: , Rfl:  .  dicyclomine (BENTYL) 20 MG tablet, Take 20 mg by mouth 2 (two) times daily. (Patient not taking: Reported on 07/06/2020), Disp: , Rfl:   Physical exam:  Physical Exam Cardiovascular:     Rate and Rhythm: Normal rate and regular rhythm.     Heart sounds: Normal heart sounds.  Pulmonary:     Effort: Pulmonary effort is normal.     Breath sounds: Normal breath sounds.  Skin:    General: Skin is warm and dry.  Neurological:     Mental Status: She is alert and oriented to person, place, and time.    Breast exam was performed in seated and lying down position. Patient is status post left lumpectomy with a well-healed surgical scar. No evidence of any palpable masses. No evidence of axillary adenopathy. No evidence of any palpable masses or lumps in the right breast. No evidence of right axillary adenopathy   CMP Latest Ref Rng & Units 07/06/2020  Glucose 70 - 99 mg/dL 108(H)  BUN 8 - 23 mg/dL 16  Creatinine 0.44 - 1.00 mg/dL 0.83  Sodium 135 - 145 mmol/L 133(L)  Potassium 3.5 - 5.1 mmol/L 5.0  Chloride 98 - 111 mmol/L 98  CO2 22 - 32 mmol/L 30  Calcium 8.9 - 10.3 mg/dL 9.2  Total Protein 6.5 - 8.1 g/dL 6.9  Total Bilirubin 0.3 - 1.2 mg/dL 0.6  Alkaline Phos 38 - 126 U/L 77  AST 15 - 41 U/L 23  ALT 0 - 44 U/L 24   CBC Latest Ref Rng & Units 07/06/2020  WBC 4.0 - 10.5 K/uL 8.9  Hemoglobin 12.0 - 15.0 g/dL 11.4(L)  Hematocrit 36.0 - 46.0 % 35.5(L)  Platelets 150 - 400 K/uL 265      Assessment and plan- Patient is a 80 y.o. female  pathologicalprognostic stage IA pT1c1p N0 [id number] mammary carcinoma of the left breast ER negative PR positive and HER-2 +3 on IHC s/p lumpectomy s/p adjuvant chemotherapy and 1 year of adjuvant  Herceptin.  She is here for routine follow-up of breast cancer  Clinically patient is doing well with no concerning signs and symptoms of recurrence based on today's exam.She would be due for a mammogram in October 2022 which will be coordinated by Dr. Sabra Heck.  This will be year for since her diagnosis of breast cancer.  I will see her back in 6 months   Visit Diagnosis 1. Encounter for follow-up surveillance of breast cancer      Dr. Randa Evens, MD, MPH [facility] at [facility] [phone] 07/07/2020 9:44 AM

## 2020-07-08 ENCOUNTER — Ambulatory Visit: Admit: 2020-07-08 | Payer: Medicare HMO | Admitting: Surgery

## 2020-07-08 SURGERY — SHOULDER ARTHROSCOPY WITH SUBACROMIAL DECOMPRESSION, ROTATOR CUFF REPAIR AND BICEP TENDON REPAIR
Anesthesia: Choice | Site: Shoulder | Laterality: Right

## 2020-07-15 DIAGNOSIS — Z85828 Personal history of other malignant neoplasm of skin: Secondary | ICD-10-CM | POA: Diagnosis not present

## 2020-07-15 DIAGNOSIS — L570 Actinic keratosis: Secondary | ICD-10-CM | POA: Diagnosis not present

## 2020-07-15 DIAGNOSIS — D2271 Melanocytic nevi of right lower limb, including hip: Secondary | ICD-10-CM | POA: Diagnosis not present

## 2020-07-15 DIAGNOSIS — D2262 Melanocytic nevi of left upper limb, including shoulder: Secondary | ICD-10-CM | POA: Diagnosis not present

## 2020-07-15 DIAGNOSIS — D2261 Melanocytic nevi of right upper limb, including shoulder: Secondary | ICD-10-CM | POA: Diagnosis not present

## 2020-07-15 DIAGNOSIS — D225 Melanocytic nevi of trunk: Secondary | ICD-10-CM | POA: Diagnosis not present

## 2020-09-03 DIAGNOSIS — L821 Other seborrheic keratosis: Secondary | ICD-10-CM | POA: Diagnosis not present

## 2020-09-08 ENCOUNTER — Other Ambulatory Visit: Payer: Self-pay

## 2020-09-08 ENCOUNTER — Ambulatory Visit
Admission: RE | Admit: 2020-09-08 | Discharge: 2020-09-08 | Disposition: A | Discharge status: Home / Self Care | Payer: Medicare HMO | Source: Ambulatory Visit | Attending: Radiation Oncology | Admitting: Radiation Oncology

## 2020-09-08 VITALS — BP 123/64 | HR 80 | Temp 97.6°F | Resp 16 | Wt 124.6 lb

## 2020-09-08 DIAGNOSIS — Z853 Personal history of malignant neoplasm of breast: Secondary | ICD-10-CM | POA: Diagnosis not present

## 2020-09-08 DIAGNOSIS — Z171 Estrogen receptor negative status [ER-]: Secondary | ICD-10-CM

## 2020-09-08 DIAGNOSIS — Z923 Personal history of irradiation: Secondary | ICD-10-CM | POA: Diagnosis not present

## 2020-09-08 NOTE — Progress Notes (Signed)
Radiation Oncology Follow up Note  Name: Doris Barnes   Date:   09/08/2020 MRN:  [id number] DOB: 09-10-1940    This 80 y.o. female presents to the clinic today for 3-year follow-up status post whole breast radiation to her left breast for stage I ER negative PR weakly positive HER2/neu overexpressed invasive mammary carcinoma the left breast.  REFERRING PROVIDER: Rusty Aus, MD  HPI: Patient is an 80 year old female now out 3 years having completed whole breast radiation to her left breast for stage I ER/PR negative HER2/neu overexpressed invasive mammary carcinoma status post wide local excision.  She is seen today in routine follow-up is doing well specifically denies breast tenderness cough or bone pain..  She had mammograms back in October which were BI-RADS 2 benign.  She has had some chronic pelvic pain had a lumbar spine as well as pelvic MRI scan showing chronic superior L2 endplate deformity with minimal to middle mild height loss no retropulsion.  Mammograms and MRI scans were reviewed she is not on antiestrogen therapy based on the ER/PR negative nature of her disease.  COMPLICATIONS OF TREATMENT: none  FOLLOW UP COMPLIANCE: keeps appointments   PHYSICAL EXAM:  BP 123/64   Pulse 80   Temp 97.6 F (36.4 C) (Tympanic)   Resp 16   Wt 124 lb 9.6 oz (56.5 kg)   BMI 22.79 kg/m  Lungs are clear to A&P cardiac examination essentially unremarkable with regular rate and rhythm. No dominant mass or nodularity is noted in either breast in 2 positions examined. Incision is well-healed. No axillary or supraclavicular adenopathy is appreciated. Cosmetic result is excellent.  Well-developed well-nourished patient in NAD. HEENT reveals PERLA, EOMI, discs not visualized.  Oral cavity is clear. No oral mucosal lesions are identified. Neck is clear without evidence of cervical or supraclavicular adenopathy. Lungs are clear to A&P. Cardiac examination is essentially unremarkable with regular  rate and rhythm without murmur rub or thrill. Abdomen is benign with no organomegaly or masses noted. Motor sensory and DTR levels are equal and symmetric in the upper and lower extremities. Cranial nerves II through XII are grossly intact. Proprioception is intact. No peripheral adenopathy or edema is identified. No motor or sensory levels are noted. Crude visual fields are within normal range.  RADIOLOGY RESULTS: Mammogram and MRI scans reviewed compatible with above-stated findings  PLAN: Present time patient is now 3 years out with no evidence of disease.  I am pleased with her overall progress.  I have asked to see her back in 1 year and then will discontinue follow-up care.  Patient knows to call with any concerns.  I would like to take this opportunity to thank you for allowing me to participate in the care of your patient.Noreene Filbert, MD

## 2020-12-01 DIAGNOSIS — I25119 Atherosclerotic heart disease of native coronary artery with unspecified angina pectoris: Secondary | ICD-10-CM | POA: Diagnosis not present

## 2020-12-01 DIAGNOSIS — R0602 Shortness of breath: Secondary | ICD-10-CM | POA: Diagnosis not present

## 2020-12-01 DIAGNOSIS — I10 Essential (primary) hypertension: Secondary | ICD-10-CM | POA: Diagnosis not present

## 2020-12-01 DIAGNOSIS — I341 Nonrheumatic mitral (valve) prolapse: Secondary | ICD-10-CM | POA: Diagnosis not present

## 2020-12-01 DIAGNOSIS — I517 Cardiomegaly: Secondary | ICD-10-CM | POA: Diagnosis not present

## 2020-12-01 DIAGNOSIS — I700 Atherosclerosis of aorta: Secondary | ICD-10-CM | POA: Diagnosis not present

## 2020-12-15 DIAGNOSIS — M810 Age-related osteoporosis without current pathological fracture: Secondary | ICD-10-CM | POA: Diagnosis not present

## 2020-12-15 DIAGNOSIS — D500 Iron deficiency anemia secondary to blood loss (chronic): Secondary | ICD-10-CM | POA: Diagnosis not present

## 2020-12-15 DIAGNOSIS — E782 Mixed hyperlipidemia: Secondary | ICD-10-CM | POA: Diagnosis not present

## 2020-12-20 DIAGNOSIS — H353131 Nonexudative age-related macular degeneration, bilateral, early dry stage: Secondary | ICD-10-CM | POA: Diagnosis not present

## 2020-12-20 DIAGNOSIS — Z0100 Encounter for examination of eyes and vision without abnormal findings: Secondary | ICD-10-CM | POA: Diagnosis not present

## 2020-12-22 ENCOUNTER — Other Ambulatory Visit: Payer: Self-pay | Admitting: Internal Medicine

## 2020-12-22 DIAGNOSIS — Z1231 Encounter for screening mammogram for malignant neoplasm of breast: Secondary | ICD-10-CM

## 2020-12-22 DIAGNOSIS — D509 Iron deficiency anemia, unspecified: Secondary | ICD-10-CM | POA: Diagnosis not present

## 2020-12-22 DIAGNOSIS — M542 Cervicalgia: Secondary | ICD-10-CM | POA: Diagnosis not present

## 2020-12-22 DIAGNOSIS — Z0000 Encounter for general adult medical examination without abnormal findings: Secondary | ICD-10-CM | POA: Diagnosis not present

## 2020-12-22 DIAGNOSIS — M25551 Pain in right hip: Secondary | ICD-10-CM | POA: Diagnosis not present

## 2020-12-22 DIAGNOSIS — Z23 Encounter for immunization: Secondary | ICD-10-CM | POA: Diagnosis not present

## 2020-12-24 DIAGNOSIS — R0602 Shortness of breath: Secondary | ICD-10-CM | POA: Diagnosis not present

## 2020-12-24 DIAGNOSIS — I25119 Atherosclerotic heart disease of native coronary artery with unspecified angina pectoris: Secondary | ICD-10-CM | POA: Diagnosis not present

## 2021-01-03 DIAGNOSIS — E782 Mixed hyperlipidemia: Secondary | ICD-10-CM | POA: Diagnosis not present

## 2021-01-03 DIAGNOSIS — I341 Nonrheumatic mitral (valve) prolapse: Secondary | ICD-10-CM | POA: Diagnosis not present

## 2021-01-03 DIAGNOSIS — I25119 Atherosclerotic heart disease of native coronary artery with unspecified angina pectoris: Secondary | ICD-10-CM | POA: Diagnosis not present

## 2021-01-03 DIAGNOSIS — H26492 Other secondary cataract, left eye: Secondary | ICD-10-CM | POA: Diagnosis not present

## 2021-01-03 DIAGNOSIS — I10 Essential (primary) hypertension: Secondary | ICD-10-CM | POA: Diagnosis not present

## 2021-01-03 DIAGNOSIS — I700 Atherosclerosis of aorta: Secondary | ICD-10-CM | POA: Diagnosis not present

## 2021-01-05 ENCOUNTER — Other Ambulatory Visit: Payer: Self-pay

## 2021-01-05 ENCOUNTER — Ambulatory Visit
Admission: RE | Admit: 2021-01-05 | Discharge: 2021-01-05 | Disposition: A | Discharge status: Home / Self Care | Payer: Medicare HMO | Source: Ambulatory Visit | Attending: Internal Medicine | Admitting: Internal Medicine

## 2021-01-05 DIAGNOSIS — Z1231 Encounter for screening mammogram for malignant neoplasm of breast: Secondary | ICD-10-CM | POA: Insufficient documentation

## 2021-01-06 ENCOUNTER — Inpatient Hospital Stay: Payer: Medicare HMO | Attending: Oncology | Admitting: Nurse Practitioner

## 2021-01-06 DIAGNOSIS — M25551 Pain in right hip: Secondary | ICD-10-CM | POA: Diagnosis not present

## 2021-01-06 DIAGNOSIS — G5701 Lesion of sciatic nerve, right lower limb: Secondary | ICD-10-CM | POA: Diagnosis not present

## 2021-01-06 DIAGNOSIS — G8929 Other chronic pain: Secondary | ICD-10-CM | POA: Diagnosis not present

## 2021-01-06 DIAGNOSIS — Z853 Personal history of malignant neoplasm of breast: Secondary | ICD-10-CM

## 2021-01-06 DIAGNOSIS — Z08 Encounter for follow-up examination after completed treatment for malignant neoplasm: Secondary | ICD-10-CM

## 2021-01-06 NOTE — Progress Notes (Signed)
Patient requests to reschedule appointment until after her mammogram. Scheduling to contact her with appt.

## 2021-01-21 ENCOUNTER — Ambulatory Visit
Admission: RE | Admit: 2021-01-21 | Discharge: 2021-01-21 | Disposition: A | Discharge status: Home / Self Care | Payer: Medicare HMO | Source: Ambulatory Visit | Attending: Internal Medicine | Admitting: Internal Medicine

## 2021-01-21 ENCOUNTER — Other Ambulatory Visit: Payer: Self-pay

## 2021-01-21 DIAGNOSIS — Z1231 Encounter for screening mammogram for malignant neoplasm of breast: Secondary | ICD-10-CM | POA: Diagnosis not present

## 2021-01-21 DIAGNOSIS — G252 Other specified forms of tremor: Secondary | ICD-10-CM | POA: Diagnosis not present

## 2021-01-28 ENCOUNTER — Other Ambulatory Visit: Payer: Self-pay

## 2021-01-28 ENCOUNTER — Inpatient Hospital Stay: Payer: Medicare HMO | Attending: Nurse Practitioner | Admitting: Oncology

## 2021-01-28 DIAGNOSIS — C50312 Malignant neoplasm of lower-inner quadrant of left female breast: Secondary | ICD-10-CM | POA: Diagnosis not present

## 2021-01-28 DIAGNOSIS — Z23 Encounter for immunization: Secondary | ICD-10-CM | POA: Diagnosis not present

## 2021-01-28 NOTE — Progress Notes (Signed)
Hematology/Oncology Consult note [facility]  Telephone:([phone] [phone]  Patient Care Team: Rusty Aus, MD as PCP - General (Internal Medicine) Sindy Guadeloupe, MD as Consulting Physician (Oncology) Herbert Pun, MD as Consulting Physician (General Surgery) Rico Junker, RN as Oncology Nurse Navigator Noreene Filbert, MD as Referring Physician (Radiation Oncology)   Name of the patient: Doris Barnes  [id number]  [birth year]   Date of visit: 01/28/21  Diagnosis- Stage IA invasive mammary carcinoma of the left breast ER negative, PR weakly positive and HER-2 positive  I connected with Doris Barnes on 01/28/21 at  2:00 PM EDT by telephone visit and verified that I am speaking with the correct person using two identifiers.   I discussed the limitations, risks, security and privacy concerns of performing an evaluation and management service by telemedicine and the availability of in-person appointments. I also discussed with the patient that there may be a patient responsible charge related to this service. The patient expressed understanding and agreed to proceed.   Other persons participating in the visit and their role in the encounter: NP and patient   Patient's location: Home  Provider's location: Clinic    Chief complaint/ Reason for visit-routine follow-up of breast cancer  Heme/Onc history: Patient is a 80 year old female who was diagnosed with invasive mammary carcinoma of the left breast ER negative, PR 11 to 50% positive and HER-2/neu positive.  Tumor was grade 314 mm.  Lymph nodes were negative for malignancy.  She started adjuvant Taxol Herceptin chemotherapy in October 2018 and has completed 1 year of adjuvant Herceptin.  She is also completed adjuvant radiation.  She could not tolerate AI and did not wish to continue hormone therapy.   Interval history-Mr. Tison presents back today virtually to discuss most  recent mammogram.  She is currently not on an aromatase inhibitor given poor tolerance.   Since her last visit, she was seen by Dr. Donella Stade and appeared to be doing well.  Today, she denies any new concerns.  She is going to have her COVID booster today.  Denies any recurrent joint pain.   ECOG PS- 1 Pain scale- 0   Review of systems- Review of Systems  Constitutional: Negative.  Negative for chills, fever, malaise/fatigue and weight loss.  HENT:  Negative for congestion, ear pain and tinnitus.   Eyes: Negative.  Negative for blurred vision and double vision.  Respiratory: Negative.  Negative for cough, sputum production and shortness of breath.   Cardiovascular: Negative.  Negative for chest pain, palpitations and leg swelling.  Gastrointestinal: Negative.  Negative for abdominal pain, constipation, diarrhea, nausea and vomiting.  Genitourinary:  Negative for dysuria, frequency and urgency.  Musculoskeletal:  Negative for back pain and falls.  Skin: Negative.  Negative for rash.  Neurological: Negative.  Negative for weakness and headaches.  Endo/Heme/Allergies: Negative.  Does not bruise/bleed easily.  Psychiatric/Behavioral: Negative.  Negative for depression. The patient is not nervous/anxious and does not have insomnia.      Allergies  Allergen Reactions   Imipramine Pamoate Shortness Of Breath   Bisphosphonates Nausea Only   Epinephrine Hcl (Nasal) Other (See Comments)    Difficulty breathing   Prednisone Palpitations   Sulfa Antibiotics Rash    Face turns red and stings     Past Medical History:  Diagnosis Date   Anemia    Anxiety    Asthma    Breast cancer ([place]) 12/14/2016   left breast  Cancer ([place]) skin   Chronic vulvitis    Colon polyp    Cystocele    Diverticulitis    Diverticulitis    Diverticulosis    Diverticulosis    Dyspareunia, female    Dyspnea    Dysrhythmia    Fibrocystic disease of both breasts    Gastritis    GERD (gastroesophageal  reflux disease)    gastritis also   Hemorrhoids    History of hiatal hernia    Hypercholesteremia    Hypertension    IBS (irritable bowel syndrome)    IC (interstitial cystitis)    Migraine    Mitral valve prolapse    OP (osteoporosis)    Personal history of chemotherapy    Personal history of radiation therapy    Positional vertigo    Rectocele    Squamous cell carcinoma    Stroke [facility])    TIA   Vaginal atrophy      Past Surgical History:  Procedure Laterality Date   APPENDECTOMY     BREAST BIOPSY Left 1998   core bx- neg   BREAST BIOPSY Left 2007   neg   BREAST BIOPSY Left 12/14/2016   left breast US core positive   BREAST EXCISIONAL BIOPSY Left    BREAST LUMPECTOMY Left 01/01/2017    INVASIVE MAMMARY CARCINOMA/Grade 3   CARDIAC CATHETERIZATION N/A 01/18/2015   Procedure: Left Heart Cath and Coronary Angiography;  Surgeon: Teodoro Spray, MD;  Location: [place] CV LAB;  Service: Cardiovascular;  Laterality: N/A;   CATARACT EXTRACTION W/PHACO Right 12/15/2015   Procedure: CATARACT EXTRACTION PHACO AND INTRAOCULAR LENS PLACEMENT (IOC);  Surgeon: Estill Cotta, MD;  Location: [facility] ORS;  Service: Ophthalmology;  Laterality: Right;  [place] 01:20 AP% 23.9 CDE 35.49 Fluid pack lot # [id number]   CATARACT EXTRACTION W/PHACO Left 12/29/2015   Procedure: CATARACT EXTRACTION PHACO AND INTRAOCULAR LENS PLACEMENT (IOC);  Surgeon: Estill Cotta, MD;  Location: [facility] ORS;  Service: Ophthalmology;  Laterality: Left;  [place]  01:20 AP% 25.1 CDE 32.86 Fluid pack lot # [id number]   COLON SURGERY     COLONOSCOPY WITH PROPOFOL N/A 02/21/2016   Procedure: COLONOSCOPY WITH PROPOFOL;  Surgeon: Manya Silvas, MD;  Location: [facility] ENDOSCOPY;  Service: Endoscopy;  Laterality: N/A;   COLONOSCOPY WITH PROPOFOL N/A 01/03/2019   Procedure: COLONOSCOPY WITH PROPOFOL;  Surgeon: Lollie Sails, MD;  Location: [facility] ENDOSCOPY;  Service: Endoscopy;  Laterality: N/A;   CORONARY ANGIOPLASTY      DILATION AND CURETTAGE OF UTERUS     ESOPHAGOGASTRODUODENOSCOPY (EGD) WITH PROPOFOL N/A 11/23/2017   Procedure: ESOPHAGOGASTRODUODENOSCOPY (EGD) WITH PROPOFOL;  Surgeon: Manya Silvas, MD;  Location: [facility] ENDOSCOPY;  Service: Endoscopy;  Laterality: N/A;   OOPHORECTOMY Bilateral    PARTIAL MASTECTOMY WITH NEEDLE LOCALIZATION Left 01/01/2017   Procedure: PARTIAL MASTECTOMY WITH NEEDLE LOCALIZATION;  Surgeon: Herbert Pun, MD;  Location: [facility] ORS;  Service: General;  Laterality: Left;   PORTACATH PLACEMENT Right 01/01/2017   Procedure: INSERTION PORT-A-CATH;  Surgeon: Herbert Pun, MD;  Location: [facility] ORS;  Service: General;  Laterality: Right;   SENTINEL NODE BIOPSY Left 01/01/2017   Procedure: SENTINEL NODE BIOPSY;  Surgeon: Herbert Pun, MD;  Location: [facility] ORS;  Service: General;  Laterality: Left;   TONSILLECTOMY     VAGINAL HYSTERECTOMY      Social History   Socioeconomic History   Marital status: Married    Spouse name: Not on file   Number of children: Not on file   Years of  education: Not on file   Highest education level: Not on file  Occupational History   Not on file  Tobacco Use   Smoking status: Former    Packs/day: 1.00    Years: 33.00    Pack years: 33.00    Types: Cigarettes    Quit date: 10/17/1987    Years since quitting: 33.3   Smokeless tobacco: Never  Vaping Use   Vaping Use: Never used  Substance and Sexual Activity   Alcohol use: No   Drug use: No   Sexual activity: Yes  Other Topics Concern   Not on file  Social History Narrative   Not on file   Social Determinants of Health   Financial Resource Strain: Not on file  Food Insecurity: Not on file  Transportation Needs: Not on file  Physical Activity: Not on file  Stress: Not on file  Social Connections: Not on file  Intimate Partner Violence: Not on file    Family History  Problem Relation Age of Onset   Diabetes Mother    Diabetes Father    Colon cancer Sister     Diabetes Sister    Diabetes Maternal Aunt    Colon cancer Paternal Grandfather    Lung cancer Brother    Breast cancer Neg Hx    Ovarian cancer Neg Hx      Current Outpatient Medications:    acetaminophen (TYLENOL) 325 MG tablet, Take 650 mg by mouth every 6 (six) hours as needed for headache., Disp: , Rfl:    acidophilus (RISAQUAD) CAPS capsule, Take 2 capsules by mouth daily., Disp: , Rfl:    aspirin EC 81 MG tablet, Take 81 mg by mouth daily. Swallow whole., Disp: , Rfl:    Calcium Carbonate-Vitamin D 600-400 MG-UNIT tablet, Take 1 tablet by mouth 2 (two) times daily., Disp: , Rfl:    FLUoxetine (PROZAC) 40 MG capsule, Take 40 mg by mouth daily., Disp: , Rfl:    furosemide (LASIX) 20 MG tablet, Take 20 mg by mouth daily., Disp: , Rfl:    gabapentin (NEURONTIN) 100 MG capsule, Take 300 mg by mouth at bedtime., Disp: , Rfl:    lansoprazole (PREVACID) 30 MG capsule, Take 30 mg by mouth daily before breakfast. , Disp: , Rfl:    Magnesium 250 MG TABS, Take 250 mg by mouth daily., Disp: , Rfl:    Multiple Vitamins-Minerals (PRESERVISION AREDS 2) CAPS, Take 1 capsule by mouth 2 (two) times daily. , Disp: , Rfl:    polyethylene glycol (MIRALAX / GLYCOLAX) packet, Take 17 g by mouth daily as needed for moderate constipation., Disp: , Rfl:    propranolol (INDERAL) 40 MG tablet, Take 40 mg by mouth 2 (two) times daily., Disp: , Rfl:    simvastatin (ZOCOR) 20 MG tablet, Take 20 mg by mouth daily at 8 pm., Disp: , Rfl:    spironolactone (ALDACTONE) 25 MG tablet, Take 25 mg by mouth daily., Disp: , Rfl:    telmisartan (MICARDIS) 80 MG tablet, Take 40 mg by mouth daily with breakfast., Disp: , Rfl:   Physical exam:  Physical Exam Neurological:     Mental Status: She is alert and oriented to person, place, and time.     CMP Latest Ref Rng & Units 07/06/2020  Glucose 70 - 99 mg/dL 108(H)  BUN 8 - 23 mg/dL 16  Creatinine 0.44 - 1.00 mg/dL 0.83  Sodium 135 - 145 mmol/L 133(L)  Potassium 3.5 -  5.1 mmol/L 5.0  Chloride 98 -  111 mmol/L 98  CO2 22 - 32 mmol/L 30  Calcium 8.9 - 10.3 mg/dL 9.2  Total Protein 6.5 - 8.1 g/dL 6.9  Total Bilirubin 0.3 - 1.2 mg/dL 0.6  Alkaline Phos 38 - 126 U/L 77  AST 15 - 41 U/L 23  ALT 0 - 44 U/L 24   CBC Latest Ref Rng & Units 07/06/2020  WBC 4.0 - 10.5 K/uL 8.9  Hemoglobin 12.0 - 15.0 g/dL 11.4(L)  Hematocrit 36.0 - 46.0 % 35.5(L)  Platelets 150 - 400 K/uL 265    Assessment and plan-Mrs. Dieu is an 80 year old female with stage Ia left breast ER negative PR positive and HER2 negative status postlumpectomy and adjuvant chemotherapy +1-year of adjuvant Herceptin.  She is here today virtually to discuss most recent mammogram results.  Clinically patient is feeling well with no concerning signs and/or symptoms.  Most recent mammogram from 01/21/2021 was read as BI-RADS Category 1 negative.  She will need a repeat in 1 year.  She is currently not on an aromatase inhibitor given poor tolerance.  Return to clinic in 6 months for follow-up with Dr. Janese Banks in person.  I provided 15 minutes of non face-to-face telephone visit time during this encounter, and > 50% was spent counseling as documented under my assessment & plan.  Visit Diagnosis 1. Malignant neoplasm of lower-inner quadrant of left female breast, unspecified estrogen receptor status ([place])    Faythe Casa, NP 01/28/2021 10:52 AM

## 2021-02-04 DIAGNOSIS — G5701 Lesion of sciatic nerve, right lower limb: Secondary | ICD-10-CM | POA: Diagnosis not present

## 2021-02-04 DIAGNOSIS — G8929 Other chronic pain: Secondary | ICD-10-CM | POA: Diagnosis not present

## 2021-02-04 DIAGNOSIS — M25551 Pain in right hip: Secondary | ICD-10-CM | POA: Diagnosis not present

## 2021-02-15 DIAGNOSIS — F32A Depression, unspecified: Secondary | ICD-10-CM | POA: Diagnosis not present

## 2021-02-15 DIAGNOSIS — G252 Other specified forms of tremor: Secondary | ICD-10-CM | POA: Diagnosis not present

## 2021-02-21 ENCOUNTER — Emergency Department
Admission: EM | Admit: 2021-02-21 | Discharge: 2021-02-21 | Disposition: A | Discharge status: Home / Self Care | Payer: Medicare HMO | Attending: Emergency Medicine | Admitting: Emergency Medicine

## 2021-02-21 ENCOUNTER — Emergency Department: Payer: Medicare HMO

## 2021-02-21 ENCOUNTER — Other Ambulatory Visit: Payer: Self-pay

## 2021-02-21 DIAGNOSIS — Z87891 Personal history of nicotine dependence: Secondary | ICD-10-CM | POA: Insufficient documentation

## 2021-02-21 DIAGNOSIS — J45909 Unspecified asthma, uncomplicated: Secondary | ICD-10-CM | POA: Diagnosis not present

## 2021-02-21 DIAGNOSIS — Z79899 Other long term (current) drug therapy: Secondary | ICD-10-CM | POA: Diagnosis not present

## 2021-02-21 DIAGNOSIS — Z85828 Personal history of other malignant neoplasm of skin: Secondary | ICD-10-CM | POA: Diagnosis not present

## 2021-02-21 DIAGNOSIS — B349 Viral infection, unspecified: Secondary | ICD-10-CM | POA: Diagnosis not present

## 2021-02-21 DIAGNOSIS — Z853 Personal history of malignant neoplasm of breast: Secondary | ICD-10-CM | POA: Diagnosis not present

## 2021-02-21 DIAGNOSIS — R911 Solitary pulmonary nodule: Secondary | ICD-10-CM | POA: Diagnosis not present

## 2021-02-21 DIAGNOSIS — Z7982 Long term (current) use of aspirin: Secondary | ICD-10-CM | POA: Insufficient documentation

## 2021-02-21 DIAGNOSIS — R509 Fever, unspecified: Secondary | ICD-10-CM | POA: Diagnosis not present

## 2021-02-21 DIAGNOSIS — Z20822 Contact with and (suspected) exposure to covid-19: Secondary | ICD-10-CM | POA: Insufficient documentation

## 2021-02-21 DIAGNOSIS — Z955 Presence of coronary angioplasty implant and graft: Secondary | ICD-10-CM | POA: Diagnosis not present

## 2021-02-21 DIAGNOSIS — R0602 Shortness of breath: Secondary | ICD-10-CM

## 2021-02-21 DIAGNOSIS — D500 Iron deficiency anemia secondary to blood loss (chronic): Secondary | ICD-10-CM | POA: Diagnosis not present

## 2021-02-21 DIAGNOSIS — R000 Tachycardia, unspecified: Secondary | ICD-10-CM | POA: Diagnosis not present

## 2021-02-21 DIAGNOSIS — I10 Essential (primary) hypertension: Secondary | ICD-10-CM | POA: Diagnosis not present

## 2021-02-21 DIAGNOSIS — J129 Viral pneumonia, unspecified: Secondary | ICD-10-CM | POA: Diagnosis not present

## 2021-02-21 LAB — COMPREHENSIVE METABOLIC PANEL
ALT: 21 U/L (ref 0–44)
AST: 23 U/L (ref 15–41)
Albumin: 3.5 g/dL (ref 3.5–5.0)
Alkaline Phosphatase: 79 U/L (ref 38–126)
Anion gap: 6 (ref 5–15)
BUN: 10 mg/dL (ref 8–23)
CO2: 26 mmol/L (ref 22–32)
Calcium: 8.5 mg/dL — ABNORMAL LOW (ref 8.9–10.3)
Chloride: 100 mmol/L (ref 98–111)
Creatinine, Ser: 0.74 mg/dL (ref 0.44–1.00)
GFR, Estimated: >60 mL/min (ref >60)
Glucose, Bld: 130 mg/dL — ABNORMAL HIGH (ref 70–99)
Potassium: 4.3 mmol/L (ref 3.5–5.1)
Sodium: 132 mmol/L — ABNORMAL LOW (ref 135–145)
Total Bilirubin: 0.8 mg/dL (ref 0.3–1.2)
Total Protein: 7 g/dL (ref 6.5–8.1)

## 2021-02-21 LAB — CBC
HCT: 36.7 % (ref 36.0–46.0)
Hemoglobin: 12 g/dL (ref 12.0–15.0)
MCH: 27.9 pg (ref 26.0–34.0)
MCHC: 32.7 g/dL (ref 30.0–36.0)
MCV: 85.3 fL (ref 80.0–100.0)
Platelets: 260 10*3/uL (ref 150–400)
RBC: 4.3 MIL/uL (ref 3.87–5.11)
RDW: 14.8 % (ref 11.5–15.5)
WBC: 7.9 10*3/uL (ref 4.0–10.5)
nRBC: 0 % (ref 0.0–0.2)

## 2021-02-21 LAB — LACTIC ACID, PLASMA: Lactic Acid, Venous: 1.3 mmol/L (ref 0.5–1.9)

## 2021-02-21 LAB — TROPONIN I (HIGH SENSITIVITY)
Troponin I (High Sensitivity): 8 ng/L (ref <18)
Troponin I (High Sensitivity): 6 ng/L (ref <18)

## 2021-02-21 LAB — RESP PANEL BY RT-PCR (FLU A&B, COVID) ARPGX2
Influenza A by PCR: NEGATIVE
Influenza B by PCR: NEGATIVE
SARS Coronavirus 2 by RT PCR: NEGATIVE

## 2021-02-21 MED ORDER — IBUPROFEN 400 MG PO TABS: 400.0000 mg | ORAL_TABLET | Freq: Once | ORAL | Status: DC

## 2021-02-21 NOTE — ED Provider Notes (Signed)
[facility]  ____________________________________________   Event Date/Time   First MD Initiated Contact with Patient 02/21/21 [id number]     (approximate)  I have reviewed the triage vital signs and the nursing notes.   HISTORY  Chief Complaint Shortness of Breath    HPI Doris Barnes is a 80 y.o. female past medical history of GERD, breast cancer, asthma who presents with shortness of breath.  Started about 3 days ago.  She endorses feeling generally unwell with body aches and headache.  She has also had decreased appetite and p.o. intake.  She had a fever at home to 101.3.  She is also felt short of breath and had cough that is productive of clear sputum.  She denies associated chest pain.  She denies nausea vomiting diarrhea or abdominal pain.  She denies lower extremity swelling.  Patient does take Lasix due to history of foot edema but she has no history of heart failure.  Patient denies history of asthma or COPD. The patient denies hx of prior DVT/PE, unilateral leg pain/swelling, hormone use, recent surgery, hx of cancer, prolonged immobilization, or hemoptysis.           Past Medical History:  Diagnosis Date   Anemia    Anxiety    Asthma    Breast cancer ([place]) 12/14/2016   left breast   Cancer ([place]) skin   Chronic vulvitis    Colon polyp    Cystocele    Diverticulitis    Diverticulitis    Diverticulosis    Diverticulosis    Dyspareunia, female    Dyspnea    Dysrhythmia    Fibrocystic disease of both breasts    Gastritis    GERD (gastroesophageal reflux disease)    gastritis also   Hemorrhoids    History of hiatal hernia    Hypercholesteremia    Hypertension    IBS (irritable bowel syndrome)    IC (interstitial cystitis)    Migraine    Mitral valve prolapse    OP (osteoporosis)    Personal history of chemotherapy    Personal history of radiation therapy    Positional vertigo    Rectocele    Squamous cell carcinoma    Stroke  [facility])    TIA   Vaginal atrophy     Patient Active Problem List   Diagnosis Date Noted   Erosive gastritis 12/12/2017   Encounter for monoclonal antibody treatment for malignancy 02/20/2017   Neutropenia ([place]) 01/30/2017   Goals of care, counseling/discussion 01/09/2017   Breast cancer ([place]) 12/21/2016   Malignant neoplasm of lower-inner quadrant of left female breast ([place]) 12/20/2016   TIA (transient ischemic attack) 02/22/2016   Ischemic colitis ([place]) 02/22/2016   Psoriasiform dermatitis 01/07/2015   Bloodgood disease 12/31/2014   HLD (hyperlipidemia) 12/31/2014   Headache, migraine 12/31/2014   Billowing mitral valve 12/31/2014   Osteoporosis, post-menopausal 12/31/2014   Squamous cell carcinoma 12/31/2014   Essential hypertension 09/18/2014   Degeneration of cervical intervertebral disc 09/18/2014   Ataxia 09/17/2014   Adaptive colitis 02/02/2014    Past Surgical History:  Procedure Laterality Date   APPENDECTOMY     BREAST BIOPSY Left 1998   core bx- neg   BREAST BIOPSY Left 2007   neg   BREAST BIOPSY Left 12/14/2016   left breast US core positive   BREAST EXCISIONAL BIOPSY Left    BREAST LUMPECTOMY Left 01/01/2017    INVASIVE MAMMARY CARCINOMA/Grade 3   CARDIAC CATHETERIZATION N/A 01/18/2015  Procedure: Left Heart Cath and Coronary Angiography;  Surgeon: Teodoro Spray, MD;  Location: [place] CV LAB;  Service: Cardiovascular;  Laterality: N/A;   CATARACT EXTRACTION W/PHACO Right 12/15/2015   Procedure: CATARACT EXTRACTION PHACO AND INTRAOCULAR LENS PLACEMENT (IOC);  Surgeon: Estill Cotta, MD;  Location: [facility] ORS;  Service: Ophthalmology;  Laterality: Right;  [place] 01:20 AP% 23.9 CDE 35.49 Fluid pack lot # [id number]   CATARACT EXTRACTION W/PHACO Left 12/29/2015   Procedure: CATARACT EXTRACTION PHACO AND INTRAOCULAR LENS PLACEMENT (IOC);  Surgeon: Estill Cotta, MD;  Location: [facility] ORS;  Service: Ophthalmology;  Laterality: Left;  [place]  01:20 AP% 25.1 CDE  32.86 Fluid pack lot # [id number]   COLON SURGERY     COLONOSCOPY WITH PROPOFOL N/A 02/21/2016   Procedure: COLONOSCOPY WITH PROPOFOL;  Surgeon: Manya Silvas, MD;  Location: [facility] ENDOSCOPY;  Service: Endoscopy;  Laterality: N/A;   COLONOSCOPY WITH PROPOFOL N/A 01/03/2019   Procedure: COLONOSCOPY WITH PROPOFOL;  Surgeon: Lollie Sails, MD;  Location: [facility] ENDOSCOPY;  Service: Endoscopy;  Laterality: N/A;   CORONARY ANGIOPLASTY     DILATION AND CURETTAGE OF UTERUS     ESOPHAGOGASTRODUODENOSCOPY (EGD) WITH PROPOFOL N/A 11/23/2017   Procedure: ESOPHAGOGASTRODUODENOSCOPY (EGD) WITH PROPOFOL;  Surgeon: Manya Silvas, MD;  Location: [facility] ENDOSCOPY;  Service: Endoscopy;  Laterality: N/A;   OOPHORECTOMY Bilateral    PARTIAL MASTECTOMY WITH NEEDLE LOCALIZATION Left 01/01/2017   Procedure: PARTIAL MASTECTOMY WITH NEEDLE LOCALIZATION;  Surgeon: Herbert Pun, MD;  Location: [facility] ORS;  Service: General;  Laterality: Left;   PORTACATH PLACEMENT Right 01/01/2017   Procedure: INSERTION PORT-A-CATH;  Surgeon: Herbert Pun, MD;  Location: [facility] ORS;  Service: General;  Laterality: Right;   SENTINEL NODE BIOPSY Left 01/01/2017   Procedure: SENTINEL NODE BIOPSY;  Surgeon: Herbert Pun, MD;  Location: [facility] ORS;  Service: General;  Laterality: Left;   TONSILLECTOMY     VAGINAL HYSTERECTOMY      Prior to Admission medications   Medication Sig Start Date End Date Taking? Authorizing Provider  acetaminophen (TYLENOL) 325 MG tablet Take 650 mg by mouth every 6 (six) hours as needed for headache.    [provider]  acidophilus (RISAQUAD) CAPS capsule Take 2 capsules by mouth daily.    [provider]  aspirin EC 81 MG tablet Take 81 mg by mouth daily. Swallow whole.    [provider]  Calcium Carbonate-Vitamin D 600-400 MG-UNIT tablet Take 1 tablet by mouth 2 (two) times daily.    [provider]  FLUoxetine (PROZAC) 40 MG capsule Take 40 mg by  mouth daily. 12/18/16   [provider]  furosemide (LASIX) 20 MG tablet Take 20 mg by mouth daily. 08/29/19   [provider]  gabapentin (NEURONTIN) 100 MG capsule Take 300 mg by mouth at bedtime. 07/30/19   [provider]  lansoprazole (PREVACID) 30 MG capsule Take 30 mg by mouth daily before breakfast.  07/24/14   [provider]  Magnesium 250 MG TABS Take 250 mg by mouth daily.    [provider]  Multiple Vitamins-Minerals (PRESERVISION AREDS 2) CAPS Take 1 capsule by mouth 2 (two) times daily.     [provider]  polyethylene glycol (MIRALAX / GLYCOLAX) packet Take 17 g by mouth daily as needed for moderate constipation.    [provider]  propranolol (INDERAL) 40 MG tablet Take 40 mg by mouth 2 (two) times daily.    [provider]  simvastatin (ZOCOR) 20 MG tablet Take  20 mg by mouth daily at 8 pm. 11/04/16   [provider]  spironolactone (ALDACTONE) 25 MG tablet Take 25 mg by mouth daily. 08/29/19   [provider]  telmisartan (MICARDIS) 80 MG tablet Take 40 mg by mouth daily with breakfast. 11/23/16   [provider]  prochlorperazine (COMPAZINE) 10 MG tablet Take 1 tablet (10 mg total) by mouth every 6 (six) hours as needed (Nausea or vomiting). 01/04/17 04/06/17  Sindy Guadeloupe, MD    Allergies Imipramine pamoate, Bisphosphonates, Epinephrine hcl (nasal), Prednisone, and Sulfa antibiotics  Family History  Problem Relation Age of Onset   Diabetes Mother    Diabetes Father    Colon cancer Sister    Diabetes Sister    Diabetes Maternal Aunt    Colon cancer Paternal Grandfather    Lung cancer Brother    Breast cancer Neg Hx    Ovarian cancer Neg Hx     Social History Social History   Tobacco Use   Smoking status: Former    Packs/day: 1.00    Years: 33.00    Pack years: 33.00    Types: Cigarettes    Quit date: 10/17/1987    Years since quitting: 33.3   Smokeless tobacco:  Never  Vaping Use   Vaping Use: Never used  Substance Use Topics   Alcohol use: No   Drug use: No    Review of Systems   Review of Systems  Constitutional:  Positive for appetite change, chills and fever.  Eyes:  Negative for visual disturbance.  Respiratory:  Positive for cough and shortness of breath. Negative for chest tightness.   Cardiovascular:  Negative for chest pain.  Gastrointestinal:  Negative for abdominal pain, nausea and vomiting.  Musculoskeletal:  Positive for myalgias.  Neurological:  Positive for headaches. Negative for weakness and numbness.  All other systems reviewed and are negative.  Physical Exam Updated Vital Signs BP 123/61 (BP Location: Right Arm)   Pulse 73   Temp 98.4 F (36.9 C) (Oral)   Resp 20   Ht 5\' 2"  (1.575 m)   Wt 54.4 kg   SpO2 96% Comment: On room air  BMI 21.95 kg/m   Physical Exam Vitals and nursing note reviewed.  Constitutional:      General: She is not in acute distress.    Appearance: Normal appearance.  HENT:     Head: Normocephalic and atraumatic.  Eyes:     General: No scleral icterus.    Conjunctiva/sclera: Conjunctivae normal.  Cardiovascular:     Rate and Rhythm: Normal rate and regular rhythm.  Pulmonary:     Effort: Pulmonary effort is normal. No respiratory distress.     Breath sounds: No stridor. No wheezing.  Abdominal:     Palpations: Abdomen is soft.     Tenderness: There is no abdominal tenderness. There is no guarding.  Musculoskeletal:        General: No deformity or signs of injury.     Cervical back: Normal range of motion.     Right lower leg: No edema.     Left lower leg: No edema.  Skin:    General: Skin is warm and dry.     Coloration: Skin is not jaundiced or pale.  Neurological:     General: No focal deficit present.     Mental Status: She is alert and oriented to person, place, and time. Mental status is at baseline.  Psychiatric:        Mood  and Affect: Mood normal.        Behavior:  Behavior normal.     LABS (all labs ordered are listed, but only abnormal results are displayed)  Labs Reviewed  COMPREHENSIVE METABOLIC PANEL - Abnormal; Notable for the following components:      Result Value   Sodium 132 (*)    Glucose, Bld 130 (*)    Calcium 8.5 (*)    All other components within normal limits  RESP PANEL BY RT-PCR (FLU A&B, COVID) ARPGX2  CBC  LACTIC ACID, PLASMA  TROPONIN I (HIGH SENSITIVITY)  TROPONIN I (HIGH SENSITIVITY)   ____________________________________________  EKG  NSR, nml axis, nml intervals, no acute ischemic changes  ____________________________________________  RADIOLOGY Almeta Monas, personally viewed and evaluated these images (plain radiographs) as part of my medical decision making, as well as reviewing the written report by the radiologist.  ED MD interpretation: Interstitial markings, no focal infiltrate    ____________________________________________   PROCEDURES  Procedure(s) performed (including Critical Care):  Procedures   ____________________________________________   INITIAL IMPRESSION / ASSESSMENT AND PLAN / ED COURSE     Patient is an 80 year old female presents with flulike symptoms shortness of breath and cough for the proximal 3 days.  Initial pulse ox is 89% on room air however on my evaluation she is satting 96% on room air appears comfortable.  Patient had a fever yesterday and has cough productive of clear sputum and some dyspnea.  Also with generalized myalgias and headache.  She is afebrile here and vital signs are otherwise within normal limits.  She is very well-appearing on exam without wheezing and clear lungs.  No lower extremity edema.  Blood work is reassuring, no leukocytosis normal lactate and negative troponin.  EKG also nonischemic.  Her chest x-ray does show some increased interstitial markings suggestive of viral process but no focal infiltrate to suggest pneumonia.  Patient is flu  and COVID-negative.  I do suspect some other viral illness.  Given she is not hypoxic and otherwise has a reassuring exam and work-up I feel like she is stable for outpatient management.  We discussed return precautions for worsening shortness of breath or any new symptoms that are concerning.      ____________________________________________   FINAL CLINICAL IMPRESSION(S) / ED DIAGNOSES  Final diagnoses:  SOB (shortness of breath)  Viral syndrome     ED Discharge Orders     None        Note:  This document was prepared using Dragon voice recognition software and may include unintentional dictation errors.    Rada Hay, MD 02/21/21 [phone]

## 2021-02-21 NOTE — Discharge Instructions (Addendum)
Your chest x-ray, blood work and EKG were all reassuring today.  You likely have a viral illness that is causing your shortness of breath.  Please return to the emergency department if your symptoms are worsening, you are unable to eat or drink or you develop any new symptoms that are concerning to you.

## 2021-02-21 NOTE — ED Triage Notes (Signed)
Pt in with co shob and fever for few days, states tonight shob became worse. No hx of the same, states has had cold symptoms.

## 2021-02-24 ENCOUNTER — Emergency Department
Admission: EM | Admit: 2021-02-24 | Discharge: 2021-02-24 | Disposition: A | Discharge status: Home / Self Care | Payer: Medicare HMO | Attending: Emergency Medicine | Admitting: Emergency Medicine

## 2021-02-24 ENCOUNTER — Other Ambulatory Visit: Payer: Self-pay

## 2021-02-24 ENCOUNTER — Encounter: Payer: Self-pay | Admitting: Radiology

## 2021-02-24 ENCOUNTER — Emergency Department: Payer: Medicare HMO

## 2021-02-24 DIAGNOSIS — Z85828 Personal history of other malignant neoplasm of skin: Secondary | ICD-10-CM | POA: Diagnosis not present

## 2021-02-24 DIAGNOSIS — Z87891 Personal history of nicotine dependence: Secondary | ICD-10-CM | POA: Diagnosis not present

## 2021-02-24 DIAGNOSIS — Z79899 Other long term (current) drug therapy: Secondary | ICD-10-CM | POA: Diagnosis not present

## 2021-02-24 DIAGNOSIS — R000 Tachycardia, unspecified: Secondary | ICD-10-CM

## 2021-02-24 DIAGNOSIS — Z955 Presence of coronary angioplasty implant and graft: Secondary | ICD-10-CM | POA: Insufficient documentation

## 2021-02-24 DIAGNOSIS — J45909 Unspecified asthma, uncomplicated: Secondary | ICD-10-CM | POA: Insufficient documentation

## 2021-02-24 DIAGNOSIS — I10 Essential (primary) hypertension: Secondary | ICD-10-CM | POA: Diagnosis not present

## 2021-02-24 DIAGNOSIS — Z20822 Contact with and (suspected) exposure to covid-19: Secondary | ICD-10-CM | POA: Diagnosis not present

## 2021-02-24 DIAGNOSIS — J129 Viral pneumonia, unspecified: Secondary | ICD-10-CM | POA: Insufficient documentation

## 2021-02-24 DIAGNOSIS — R0602 Shortness of breath: Secondary | ICD-10-CM | POA: Diagnosis not present

## 2021-02-24 DIAGNOSIS — Z853 Personal history of malignant neoplasm of breast: Secondary | ICD-10-CM | POA: Diagnosis not present

## 2021-02-24 DIAGNOSIS — R911 Solitary pulmonary nodule: Secondary | ICD-10-CM | POA: Diagnosis not present

## 2021-02-24 DIAGNOSIS — R509 Fever, unspecified: Secondary | ICD-10-CM | POA: Diagnosis not present

## 2021-02-24 DIAGNOSIS — Z7982 Long term (current) use of aspirin: Secondary | ICD-10-CM | POA: Insufficient documentation

## 2021-02-24 LAB — CBC
HCT: 35.6 % — ABNORMAL LOW (ref 36.0–46.0)
Hemoglobin: 11.5 g/dL — ABNORMAL LOW (ref 12.0–15.0)
MCH: 27.6 pg (ref 26.0–34.0)
MCHC: 32.3 g/dL (ref 30.0–36.0)
MCV: 85.4 fL (ref 80.0–100.0)
Platelets: 282 10*3/uL (ref 150–400)
RBC: 4.17 MIL/uL (ref 3.87–5.11)
RDW: 14.8 % (ref 11.5–15.5)
WBC: 6.4 10*3/uL (ref 4.0–10.5)
nRBC: 0 % (ref 0.0–0.2)

## 2021-02-24 LAB — URINALYSIS, COMPLETE (UACMP) WITH MICROSCOPIC
Bacteria, UA: NONE SEEN
Bilirubin Urine: NEGATIVE
Glucose, UA: NEGATIVE mg/dL
Hgb urine dipstick: NEGATIVE
Ketones, ur: NEGATIVE mg/dL
Leukocytes,Ua: NEGATIVE
Nitrite: NEGATIVE
Protein, ur: NEGATIVE mg/dL
Specific Gravity, Urine: 1.015 (ref 1.005–1.030)
WBC, UA: NONE SEEN WBC/hpf (ref 0–5)
pH: 7 (ref 5.0–8.0)

## 2021-02-24 LAB — TROPONIN I (HIGH SENSITIVITY)
Troponin I (High Sensitivity): 11 ng/L (ref <18)
Troponin I (High Sensitivity): 12 ng/L (ref <18)

## 2021-02-24 LAB — BASIC METABOLIC PANEL
Anion gap: 6 (ref 5–15)
BUN: 8 mg/dL (ref 8–23)
CO2: 28 mmol/L (ref 22–32)
Calcium: 8.8 mg/dL — ABNORMAL LOW (ref 8.9–10.3)
Chloride: 94 mmol/L — ABNORMAL LOW (ref 98–111)
Creatinine, Ser: 0.78 mg/dL (ref 0.44–1.00)
GFR, Estimated: >60 mL/min (ref >60)
Glucose, Bld: 171 mg/dL — ABNORMAL HIGH (ref 70–99)
Potassium: 4 mmol/L (ref 3.5–5.1)
Sodium: 128 mmol/L — ABNORMAL LOW (ref 135–145)

## 2021-02-24 LAB — RESP PANEL BY RT-PCR (FLU A&B, COVID) ARPGX2
Influenza A by PCR: NEGATIVE
Influenza B by PCR: NEGATIVE
SARS Coronavirus 2 by RT PCR: NEGATIVE

## 2021-02-24 LAB — D-DIMER, QUANTITATIVE: D-Dimer, Quant: 1.07 ug/mL-FEU — ABNORMAL HIGH (ref 0.00–0.50)

## 2021-02-24 MED ORDER — IOHEXOL 350 MG/ML SOLN
75.0000 mL | Freq: Once | INTRAVENOUS | Status: AC | PRN
  Administered 2021-02-24: 75 mL via INTRAVENOUS

## 2021-02-24 MED ORDER — SODIUM CHLORIDE 0.9 % IV BOLUS
1000.0000 mL | Freq: Once | INTRAVENOUS | Status: AC
  Administered 2021-02-24: 1000 mL via INTRAVENOUS

## 2021-02-24 NOTE — ED Notes (Signed)
Pt refused DG chest due to having one the other day.

## 2021-02-24 NOTE — ED Triage Notes (Signed)
Pt here with tachycardia. Pt states her HR went up to 148 and she was told by her doctor to come to the ED. Pt states that she also has been running a 102 fever this week and very SOB. Pt in NAD in triage.

## 2021-02-24 NOTE — ED Provider Notes (Signed)
[facility]  ____________________________________________   Event Date/Time   First MD Initiated Contact with Patient 02/24/21 1837     (approximate)  I have reviewed the triage vital signs and the nursing notes.   HISTORY  Chief Complaint Tachycardia and Shortness of Breath    HPI Doris Barnes is a 80 y.o. female with past medical history of anemia, anxiety, asthma who presents with dyspnea and tachycardia.  Patient was seen by myself in the ED several days ago.  She has had cough shortness of breath and fevers since Sunday, approximately 4 days.  She endorses ongoing fevers to as high as 102 as well as cough that is nonproductive.  She denies chest pain.  Has had some's associated dyspnea.  Saw her in the emergency department on 11/28, her work-up is reassuring including labs and chest x-ray and she was discharged with a diagnosis of viral illness.  She did call her primary care provider who prescribed her an antibiotic for her pneumonia.  She is actually feeling better today but then when she stood up she felt her heart rate racing and checked and it was in the 140s.  She called her primary doctor who told her to come to the emergency department.  Currently she is feeling better.  Does continue to have some nausea but no significant vomiting and again denies chest pain.         Past Medical History:  Diagnosis Date   Anemia    Anxiety    Asthma    Breast cancer ([place]) 12/14/2016   left breast   Cancer ([place]) skin   Chronic vulvitis    Colon polyp    Cystocele    Diverticulitis    Diverticulitis    Diverticulosis    Diverticulosis    Dyspareunia, female    Dyspnea    Dysrhythmia    Fibrocystic disease of both breasts    Gastritis    GERD (gastroesophageal reflux disease)    gastritis also   Hemorrhoids    History of hiatal hernia    Hypercholesteremia    Hypertension    IBS (irritable bowel syndrome)    IC (interstitial cystitis)     Migraine    Mitral valve prolapse    OP (osteoporosis)    Personal history of chemotherapy    Personal history of radiation therapy    Positional vertigo    Rectocele    Squamous cell carcinoma    Stroke [facility])    TIA   Vaginal atrophy     Patient Active Problem List   Diagnosis Date Noted   Erosive gastritis 12/12/2017   Encounter for monoclonal antibody treatment for malignancy 02/20/2017   Neutropenia ([place]) 01/30/2017   Goals of care, counseling/discussion 01/09/2017   Breast cancer ([place]) 12/21/2016   Malignant neoplasm of lower-inner quadrant of left female breast ([place]) 12/20/2016   TIA (transient ischemic attack) 02/22/2016   Ischemic colitis ([place]) 02/22/2016   Psoriasiform dermatitis 01/07/2015   Bloodgood disease 12/31/2014   HLD (hyperlipidemia) 12/31/2014   Headache, migraine 12/31/2014   Billowing mitral valve 12/31/2014   Osteoporosis, post-menopausal 12/31/2014   Squamous cell carcinoma 12/31/2014   Essential hypertension 09/18/2014   Degeneration of cervical intervertebral disc 09/18/2014   Ataxia 09/17/2014   Adaptive colitis 02/02/2014    Past Surgical History:  Procedure Laterality Date   APPENDECTOMY     BREAST BIOPSY Left 1998   core bx- neg   BREAST BIOPSY Left 2007  neg   BREAST BIOPSY Left 12/14/2016   left breast US core positive   BREAST EXCISIONAL BIOPSY Left    BREAST LUMPECTOMY Left 01/01/2017    INVASIVE MAMMARY CARCINOMA/Grade 3   CARDIAC CATHETERIZATION N/A 01/18/2015   Procedure: Left Heart Cath and Coronary Angiography;  Surgeon: Teodoro Spray, MD;  Location: [facility];  Service: Cardiovascular;  Laterality: N/A;   CATARACT EXTRACTION W/PHACO Right 12/15/2015   Procedure: CATARACT EXTRACTION PHACO AND INTRAOCULAR LENS PLACEMENT (IOC);  Surgeon: Estill Cotta, MD;  Location: [facility] ORS;  Service: Ophthalmology;  Laterality: Right;  [place] 01:20 AP% 23.9 CDE 35.49 Fluid pack lot # [id number]   CATARACT EXTRACTION W/PHACO Left  12/29/2015   Procedure: CATARACT EXTRACTION PHACO AND INTRAOCULAR LENS PLACEMENT (IOC);  Surgeon: Estill Cotta, MD;  Location: [facility] ORS;  Service: Ophthalmology;  Laterality: Left;  [place]  01:20 AP% 25.1 CDE 32.86 Fluid pack lot # [id number]   COLON SURGERY     COLONOSCOPY WITH PROPOFOL N/A 02/21/2016   Procedure: COLONOSCOPY WITH PROPOFOL;  Surgeon: Manya Silvas, MD;  Location: [facility] ENDOSCOPY;  Service: Endoscopy;  Laterality: N/A;   COLONOSCOPY WITH PROPOFOL N/A 01/03/2019   Procedure: COLONOSCOPY WITH PROPOFOL;  Surgeon: Lollie Sails, MD;  Location: [facility] ENDOSCOPY;  Service: Endoscopy;  Laterality: N/A;   CORONARY ANGIOPLASTY     DILATION AND CURETTAGE OF UTERUS     ESOPHAGOGASTRODUODENOSCOPY (EGD) WITH PROPOFOL N/A 11/23/2017   Procedure: ESOPHAGOGASTRODUODENOSCOPY (EGD) WITH PROPOFOL;  Surgeon: Manya Silvas, MD;  Location: [facility] ENDOSCOPY;  Service: Endoscopy;  Laterality: N/A;   OOPHORECTOMY Bilateral    PARTIAL MASTECTOMY WITH NEEDLE LOCALIZATION Left 01/01/2017   Procedure: PARTIAL MASTECTOMY WITH NEEDLE LOCALIZATION;  Surgeon: Herbert Pun, MD;  Location: [facility] ORS;  Service: General;  Laterality: Left;   PORTACATH PLACEMENT Right 01/01/2017   Procedure: INSERTION PORT-A-CATH;  Surgeon: Herbert Pun, MD;  Location: [facility] ORS;  Service: General;  Laterality: Right;   SENTINEL NODE BIOPSY Left 01/01/2017   Procedure: SENTINEL NODE BIOPSY;  Surgeon: Herbert Pun, MD;  Location: [facility] ORS;  Service: General;  Laterality: Left;   TONSILLECTOMY     VAGINAL HYSTERECTOMY      Prior to Admission medications   Medication Sig Start Date End Date Taking? Authorizing Provider  acetaminophen (TYLENOL) 325 MG tablet Take 650 mg by mouth every 6 (six) hours as needed for headache.    [provider]  acidophilus (RISAQUAD) CAPS capsule Take 2 capsules by mouth daily.    [provider]  aspirin EC 81 MG tablet Take 81 mg by mouth daily. Swallow  whole.    [provider]  Calcium Carbonate-Vitamin D 600-400 MG-UNIT tablet Take 1 tablet by mouth 2 (two) times daily.    [provider]  FLUoxetine (PROZAC) 40 MG capsule Take 40 mg by mouth daily. 12/18/16   [provider]  furosemide (LASIX) 20 MG tablet Take 20 mg by mouth daily. 08/29/19   [provider]  gabapentin (NEURONTIN) 100 MG capsule Take 300 mg by mouth at bedtime. 07/30/19   [provider]  lansoprazole (PREVACID) 30 MG capsule Take 30 mg by mouth daily before breakfast.  07/24/14   [provider]  Magnesium 250 MG TABS Take 250 mg by mouth daily.    [provider]  Multiple Vitamins-Minerals (PRESERVISION AREDS 2) CAPS Take 1 capsule by mouth 2 (two) times daily.     [provider]  polyethylene glycol (MIRALAX / GLYCOLAX) packet Take 17 g  by mouth daily as needed for moderate constipation.    [provider]  propranolol (INDERAL) 40 MG tablet Take 40 mg by mouth 2 (two) times daily.    [provider]  simvastatin (ZOCOR) 20 MG tablet Take 20 mg by mouth daily at 8 pm. 11/04/16   [provider]  spironolactone (ALDACTONE) 25 MG tablet Take 25 mg by mouth daily. 08/29/19   [provider]  telmisartan (MICARDIS) 80 MG tablet Take 40 mg by mouth daily with breakfast. 11/23/16   [provider]  prochlorperazine (COMPAZINE) 10 MG tablet Take 1 tablet (10 mg total) by mouth every 6 (six) hours as needed (Nausea or vomiting). 01/04/17 04/06/17  Sindy Guadeloupe, MD    Allergies Imipramine pamoate, Bisphosphonates, Epinephrine hcl (nasal), Prednisone, and Sulfa antibiotics  Family History  Problem Relation Age of Onset   Diabetes Mother    Diabetes Father    Colon cancer Sister    Diabetes Sister    Diabetes Maternal Aunt    Colon cancer Paternal Grandfather    Lung cancer Brother    Breast cancer Neg Hx    Ovarian cancer Neg Hx     Social History Social  History   Tobacco Use   Smoking status: Former    Packs/day: 1.00    Years: 33.00    Pack years: 33.00    Types: Cigarettes    Quit date: 10/17/1987    Years since quitting: 33.3   Smokeless tobacco: Never  Vaping Use   Vaping Use: Never used  Substance Use Topics   Alcohol use: No   Drug use: No    Review of Systems   Review of Systems  Constitutional:  Positive for appetite change, chills and fever.  Respiratory:  Positive for cough and shortness of breath. Negative for chest tightness.   Cardiovascular:  Positive for palpitations. Negative for chest pain and leg swelling.  Gastrointestinal:  Positive for nausea. Negative for abdominal pain and vomiting.  Genitourinary:  Negative for dysuria.  All other systems reviewed and are negative.  Physical Exam Updated Vital Signs BP (!) 142/75   Pulse 90   Temp 97.9 F (36.6 C) (Oral)   Resp 18   Ht 5\' 1"  (1.549 m)   Wt 53.5 kg   SpO2 96%   BMI 22.30 kg/m   Physical Exam Vitals and nursing note reviewed.  Constitutional:      General: She is not in acute distress.    Appearance: Normal appearance.  HENT:     Head: Normocephalic and atraumatic.  Eyes:     General: No scleral icterus.    Conjunctiva/sclera: Conjunctivae normal.  Cardiovascular:     Rate and Rhythm: Normal rate and regular rhythm.  Pulmonary:     Effort: Pulmonary effort is normal. No respiratory distress.     Breath sounds: No stridor.  Abdominal:     Palpations: Abdomen is soft. There is no mass.     Tenderness: There is no guarding.  Musculoskeletal:        General: No deformity or signs of injury.     Cervical back: Normal range of motion.     Right lower leg: No edema.     Left lower leg: No edema.  Skin:    General: Skin is dry.     Coloration: Skin is not jaundiced or pale.  Neurological:     General: No focal deficit present.     Mental Status: She is alert and  oriented to person, place, and time. Mental status is at baseline.   Psychiatric:        Mood and Affect: Mood normal.        Behavior: Behavior normal.     LABS (all labs ordered are listed, but only abnormal results are displayed)  Labs Reviewed  BASIC METABOLIC PANEL - Abnormal; Notable for the following components:      Result Value   Sodium 128 (*)    Chloride 94 (*)    Glucose, Bld 171 (*)    Calcium 8.8 (*)    All other components within normal limits  CBC - Abnormal; Notable for the following components:   Hemoglobin 11.5 (*)    HCT 35.6 (*)    All other components within normal limits  URINALYSIS, COMPLETE (UACMP) WITH MICROSCOPIC - Abnormal; Notable for the following components:   Color, Urine STRAW (*)    APPearance CLEAR (*)    All other components within normal limits  D-DIMER, QUANTITATIVE - Abnormal; Notable for the following components:   D-Dimer, Quant 1.07 (*)    All other components within normal limits  RESP PANEL BY RT-PCR (FLU A&B, COVID) ARPGX2  TROPONIN I (HIGH SENSITIVITY)  TROPONIN I (HIGH SENSITIVITY)   ____________________________________________  EKG  Sinus tachycardia, LVH, normal intervals, normal axis, no acute ischemic changes ____________________________________________  RADIOLOGY I, Madelin Headings, personally viewed and evaluated these images (plain radiographs) as part of my medical decision making, as well as reviewing the written report by the radiologist.  ED MD interpretation: I reviewed the CTA which is negative for acute PE and shows some atypical versus viral pneumonia    ____________________________________________   PROCEDURES  Procedure(s) performed (including Critical Care):  Procedures   ____________________________________________   INITIAL IMPRESSION / ASSESSMENT AND PLAN / ED COURSE     Patient is an 80 year old female who presents with ongoing cough and shortness of breath as well as tachycardia today.  Her initial heart rate is elevated in triage to 120s and her EKG  shows sinus tachycardia.  On my evaluation her heart rate is in the 80s and this was prior to any intervention.  Her lungs are clear and she looks well.  She is being treated for pneumonia by her primary doctor.  I saw her several days ago and presumed that she had a viral illness and overall she was feeling better today other than the tachycardia.  Her troponin is negative and her labs are overall reassuring.  Given the tachycardia and ongoing shortness of breath I did send a D-dimer to screen for pulmonary embolism which was elevated at 1.07 so a CT angio was obtained which is negative for PE does show likely a viral picture versus an atypical pneumonia.  Patient's heart rate has been normal while in the ED she is not hypoxic and otherwise appears well nontoxic.  Also obtain a UA given the fevers which is negative.  I believe she is stable for discharge with PCP follow-up at this time.      ____________________________________________   FINAL CLINICAL IMPRESSION(S) / ED DIAGNOSES  Final diagnoses:  Tachycardia  Viral pneumonia     ED Discharge Orders     None        Note:  This document was prepared using Dragon voice recognition software and may include unintentional dictation errors.    Rada Hay, MD 02/24/21 [phone]

## 2021-03-02 DIAGNOSIS — J129 Viral pneumonia, unspecified: Secondary | ICD-10-CM | POA: Diagnosis not present

## 2021-03-02 DIAGNOSIS — R0902 Hypoxemia: Secondary | ICD-10-CM | POA: Diagnosis not present

## 2021-03-04 DIAGNOSIS — J129 Viral pneumonia, unspecified: Secondary | ICD-10-CM | POA: Diagnosis not present

## 2021-03-11 DIAGNOSIS — J129 Viral pneumonia, unspecified: Secondary | ICD-10-CM | POA: Diagnosis not present

## 2021-03-11 DIAGNOSIS — J189 Pneumonia, unspecified organism: Secondary | ICD-10-CM | POA: Diagnosis not present

## 2021-03-30 ENCOUNTER — Other Ambulatory Visit: Payer: Self-pay

## 2021-03-30 ENCOUNTER — Emergency Department
Admission: EM | Admit: 2021-03-30 | Discharge: 2021-03-30 | Disposition: A | Discharge status: Home / Self Care | Payer: Medicare HMO | Attending: Emergency Medicine | Admitting: Emergency Medicine

## 2021-03-30 ENCOUNTER — Emergency Department: Payer: Medicare HMO

## 2021-03-30 ENCOUNTER — Encounter: Payer: Self-pay | Admitting: Emergency Medicine

## 2021-03-30 DIAGNOSIS — R9431 Abnormal electrocardiogram [ECG] [EKG]: Secondary | ICD-10-CM | POA: Diagnosis not present

## 2021-03-30 DIAGNOSIS — K760 Fatty (change of) liver, not elsewhere classified: Secondary | ICD-10-CM | POA: Diagnosis not present

## 2021-03-30 DIAGNOSIS — R42 Dizziness and giddiness: Secondary | ICD-10-CM | POA: Diagnosis not present

## 2021-03-30 DIAGNOSIS — R159 Full incontinence of feces: Secondary | ICD-10-CM | POA: Diagnosis not present

## 2021-03-30 DIAGNOSIS — R1084 Generalized abdominal pain: Secondary | ICD-10-CM | POA: Diagnosis not present

## 2021-03-30 DIAGNOSIS — R109 Unspecified abdominal pain: Secondary | ICD-10-CM | POA: Diagnosis not present

## 2021-03-30 DIAGNOSIS — K643 Fourth degree hemorrhoids: Secondary | ICD-10-CM | POA: Diagnosis not present

## 2021-03-30 DIAGNOSIS — K648 Other hemorrhoids: Secondary | ICD-10-CM | POA: Diagnosis not present

## 2021-03-30 DIAGNOSIS — M545 Low back pain, unspecified: Secondary | ICD-10-CM | POA: Insufficient documentation

## 2021-03-30 DIAGNOSIS — R197 Diarrhea, unspecified: Secondary | ICD-10-CM | POA: Diagnosis present

## 2021-03-30 DIAGNOSIS — R1032 Left lower quadrant pain: Secondary | ICD-10-CM | POA: Diagnosis present

## 2021-03-30 LAB — COMPREHENSIVE METABOLIC PANEL
ALT: 18 U/L (ref 0–44)
AST: 21 U/L (ref 15–41)
Albumin: 3.8 g/dL (ref 3.5–5.0)
Alkaline Phosphatase: 79 U/L (ref 38–126)
Anion gap: 8 (ref 5–15)
BUN: 10 mg/dL (ref 8–23)
CO2: 28 mmol/L (ref 22–32)
Calcium: 9.1 mg/dL (ref 8.9–10.3)
Chloride: 98 mmol/L (ref 98–111)
Creatinine, Ser: 0.83 mg/dL (ref 0.44–1.00)
GFR, Estimated: >60 mL/min (ref >60)
Glucose, Bld: 132 mg/dL — ABNORMAL HIGH (ref 70–99)
Potassium: 4.1 mmol/L (ref 3.5–5.1)
Sodium: 134 mmol/L — ABNORMAL LOW (ref 135–145)
Total Bilirubin: 0.7 mg/dL (ref 0.3–1.2)
Total Protein: 7.2 g/dL (ref 6.5–8.1)

## 2021-03-30 LAB — CBC
HCT: 37 % (ref 36.0–46.0)
Hemoglobin: 11.3 g/dL — ABNORMAL LOW (ref 12.0–15.0)
MCH: 25.9 pg — ABNORMAL LOW (ref 26.0–34.0)
MCHC: 30.5 g/dL (ref 30.0–36.0)
MCV: 84.9 fL (ref 80.0–100.0)
Platelets: 339 10*3/uL (ref 150–400)
RBC: 4.36 MIL/uL (ref 3.87–5.11)
RDW: 14.7 % (ref 11.5–15.5)
WBC: 11.7 10*3/uL — ABNORMAL HIGH (ref 4.0–10.5)
nRBC: 0 % (ref 0.0–0.2)

## 2021-03-30 LAB — LIPASE, BLOOD: Lipase: 32 U/L (ref 11–51)

## 2021-03-30 LAB — URINALYSIS, ROUTINE W REFLEX MICROSCOPIC
Bilirubin Urine: NEGATIVE
Glucose, UA: NEGATIVE mg/dL
Hgb urine dipstick: NEGATIVE
Ketones, ur: NEGATIVE mg/dL
Leukocytes,Ua: NEGATIVE
Nitrite: NEGATIVE
Protein, ur: NEGATIVE mg/dL
Specific Gravity, Urine: 1.003 — ABNORMAL LOW (ref 1.005–1.030)
pH: 6 (ref 5.0–8.0)

## 2021-03-30 MED ORDER — HYDROCORTISONE ACETATE 25 MG RE SUPP: 25.0000 mg | Freq: Two times a day (BID) | RECTAL | 1 refills | Status: AC

## 2021-03-30 MED ORDER — IOHEXOL 300 MG/ML  SOLN
80.0000 mL | Freq: Once | INTRAMUSCULAR | Status: AC | PRN
  Administered 2021-03-30: 80 mL via INTRAVENOUS
  Filled 2021-03-30: qty 80

## 2021-03-30 MED ORDER — DIBUCAINE (PERIANAL) 1 % EX OINT: 1.0000 "application " | TOPICAL_OINTMENT | CUTANEOUS | 0 refills | Status: AC | PRN

## 2021-03-30 MED ORDER — SODIUM CHLORIDE 0.9 % IV BOLUS
1000.0000 mL | Freq: Once | INTRAVENOUS | Status: AC
  Administered 2021-03-30: 1000 mL via INTRAVENOUS

## 2021-03-30 NOTE — ED Triage Notes (Signed)
Pt comes into the ED via POV c/o diarrhea and dizziness.  Pt states that she now is having hemorrhoids and rectal pain as well.  Pt states that she has already had 4 episodes of bowel movements today and now she has dizziness.  Pt explains the symptoms have been ongoing since Saturday.  Pt ambulatory to triage and in NAD with even and unlabored respirations.

## 2021-03-30 NOTE — Discharge Instructions (Addendum)
You have been prescribed Anusol and Dibucaine for hemorrhoids. Stool panel is in process. He can follow-up with general surgery regarding hemorrhoids if they do not improve over the next 2-3 weeks.

## 2021-03-30 NOTE — ED Provider Triage Note (Signed)
Emergency Medicine Provider Triage Evaluation Note  Doris Barnes , a 81 y.o. female  was evaluated in triage.  Pt complains of dizziness and diarrhea. Symptoms started 4 days ago. Four diarrhea stools today and which has caused hemorrhoids and rectal pain. No vomiting..  Review of Systems  Positive: Diarrhea, dizziness Negative: Fever  Physical Exam  Ht 5\' 1"  (1.549 m)    Wt 53.5 kg    BMI 22.29 kg/m  Gen:   Awake, no distress   Resp:  Normal effort  MSK:   Moves extremities without difficulty  Other:    Medical Decision Making  Medically screening exam initiated at 12:29 PM.  Appropriate orders placed.  Doris Barnes was informed that the remainder of the evaluation will be completed by another provider, this initial triage assessment does not replace that evaluation, and the importance of remaining in the ED until their evaluation is complete.   Victorino Dike, FNP 03/30/21 1232

## 2021-03-30 NOTE — ED Provider Notes (Signed)
[facility] Provider Note  Patient Contact: 8:29 PM (approximate)   History   Diarrhea and Dizziness   HPI  Doris Barnes is a 81 y.o. female presents to the emergency department with left lower quadrant abdominal discomfort and stool incontinence that started on Saturday.  Patient states that she has had some discomfort in her low back but denies any numbness or tingling in her legs and has been able to ambulate easily.  She denies fever and chills.  States that she was recently diagnosed with viral pneumonia but has not taken any recent antibiotics.  No associated rhinorrhea, nasal congestion or nonproductive cough.  Patient also has new hemorrhoids which she has not experienced in the past.  Patient states that she has had increased straining lately.  No sick contacts in the home with similar symptoms.  No chest pain, chest tightness or shortness of breath.      Physical Exam   Triage Vital Signs: ED Triage Vitals  Enc Vitals Group     BP 03/30/21 1230 140/70     Pulse Rate 03/30/21 1230 71     Resp 03/30/21 1230 16     Temp 03/30/21 1230 98.2 F (36.8 C)     Temp Source 03/30/21 1230 Oral     SpO2 03/30/21 1230 96 %     Weight 03/30/21 1227 117 lb 15.1 oz (53.5 kg)     Height 03/30/21 1227 5\' 1"  (1.549 m)     Head Circumference --      Peak Flow --      Pain Score 03/30/21 1227 8     Pain Loc --      Pain Edu? --      Excl. in [place]? --     Most recent vital signs: Vitals:   03/30/21 2108 03/30/21 2311  BP: (!) 141/78 (!) 135/47  Pulse: 78 78  Resp: 17 14  Temp: 98.4 F (36.9 C)   SpO2: 99% 98%     General: Alert and in no acute distress. Eyes:  PERRL. EOMI. Head: No acute traumatic findings ENT:      Ears:       Nose: No congestion/rhinnorhea.      Mouth/Throat: Mucous membranes are moist. Neck: No stridor. No cervical spine tenderness to palpation.   Hematological/Lymphatic/Immunilogical: No cervical  lymphadenopathy. Cardiovascular:  Good peripheral perfusion Respiratory: Normal respiratory effort without tachypnea or retractions. Lungs CTAB. Good air entry to the bases with no decreased or absent breath sounds. Gastrointestinal: Bowel sounds 4 quadrants. Soft and tender to palpation in the left lower quadrant. No palpable masses. No distention. No CVA tenderness.  Patient has stage IV nonthrombosed hemorrhoids. Musculoskeletal: Full range of motion to all extremities.  Neurologic:  No gross focal neurologic deficits are appreciated.  Skin:   No rash noted Other:   ED Results / Procedures / Treatments   Labs (all labs ordered are listed, but only abnormal results are displayed) Labs Reviewed  COMPREHENSIVE METABOLIC PANEL - Abnormal; Notable for the following components:      Result Value   Sodium 134 (*)    Glucose, Bld 132 (*)    All other components within normal limits  CBC - Abnormal; Notable for the following components:   WBC 11.7 (*)    Hemoglobin 11.3 (*)    MCH 25.9 (*)    All other components within normal limits  URINALYSIS, ROUTINE W REFLEX MICROSCOPIC - Abnormal; Notable for the following components:  Color, Urine STRAW (*)    APPearance CLEAR (*)    Specific Gravity, Urine 1.003 (*)    All other components within normal limits  GASTROINTESTINAL PANEL BY PCR, STOOL (REPLACES STOOL CULTURE)  C DIFFICILE QUICK SCREEN W PCR REFLEX    LIPASE, BLOOD     EKG  Normal sinus rhythm without ST segment elevation or other apparent arrhythmia.   RADIOLOGY  I personally viewed and evaluated these images as part of my medical decision making, as well as reviewing the written report by the radiologist.  ED Provider Interpretation:   No acute abnormality on CT abdomen and pelvis. No acute abnormality on CT of the lumbar spine.    MEDICATIONS ORDERED IN ED: Medications  sodium chloride 0.9 % bolus 1,000 mL (0 mLs Intravenous Stopped 03/30/21 2305)  iohexol  (OMNIPAQUE) 300 MG/ML solution 80 mL (80 mLs Intravenous Contrast Given 03/30/21 2122)     IMPRESSION / MDM / ASSESSMENT AND PLAN / ED COURSE  I reviewed the triage vital signs and the nursing notes.                              Differential diagnosis includes, but is not limited to, C. difficile, diverticulitis, compression fracture, enteritis, COVID-19, bowel obstruction, intra-abdominal abscess...  Assessment and plan Hemorrhoids Incontinence 81 year old female with a history of respiratory failure, tremor, breast cancer, coronary artery disease presents to the emergency department with new incontinence of stool that started on Saturday.    Vital signs were reassuring at triage.  On physical exam, patient was alert, active and nontoxic-appearing.  Patient does have stage IV nonthrombosed hemorrhoids on exam with some liquid stool within diaper.  Abdomen is tender in the left lower quadrant.  Patient has a normal neuro exam.  Patient has mildly elevated white blood cell count 11.7.  Lipase within reference range.  Urinalysis shows no signs of UTI.  Will obtain CT abdomen and pelvis as well as CT of the lumbar spine.  Discussed work-up with attending Dr. Linus Mako who agrees with plan of care at this time.  CT abdomen and pelvis shows no acute abnormality.  No acute abnormality on CT of the lumbar spine.  Patient was able to produce a solid-appearing stool sample while in the emergency department and C. difficile and GI panel in process at this time.  Will treat patient for hemorrhoids with Anusol and to be can have patient follow-up with general surgery as patient has stage IV nonthrombosed hemorrhoids on exam.      FINAL CLINICAL IMPRESSION(S) / ED DIAGNOSES   Final diagnoses:  Incontinence of feces, unspecified fecal incontinence type  Abdominal discomfort  Grade IV hemorrhoids     Rx / DC Orders   ED Discharge Orders          Ordered    hydrocortisone (ANUSOL-HC) 25  MG suppository  Every 12 hours        03/30/21 2253    dibucaine (NUPERCAINAL) 1 % OINT  As needed        03/30/21 2253             Note:  This document was prepared using Dragon voice recognition software and may include unintentional dictation errors.   Vallarie Mare [place], PA-C 03/30/21 2320    Vladimir Crofts, MD 03/30/21 [phone]

## 2021-03-31 DIAGNOSIS — R194 Change in bowel habit: Secondary | ICD-10-CM | POA: Diagnosis not present

## 2021-03-31 DIAGNOSIS — K5909 Other constipation: Secondary | ICD-10-CM | POA: Diagnosis not present

## 2021-03-31 DIAGNOSIS — R198 Other specified symptoms and signs involving the digestive system and abdomen: Secondary | ICD-10-CM | POA: Diagnosis not present

## 2021-03-31 LAB — GASTROINTESTINAL PANEL BY PCR, STOOL (REPLACES STOOL CULTURE)

## 2021-03-31 LAB — C DIFFICILE QUICK SCREEN W PCR REFLEX
C Diff antigen: NEGATIVE
C Diff interpretation: NOT DETECTED
C Diff toxin: NEGATIVE

## 2021-04-15 DIAGNOSIS — J189 Pneumonia, unspecified organism: Secondary | ICD-10-CM | POA: Diagnosis not present

## 2021-04-15 DIAGNOSIS — C50919 Malignant neoplasm of unspecified site of unspecified female breast: Secondary | ICD-10-CM | POA: Diagnosis not present

## 2021-04-15 DIAGNOSIS — J129 Viral pneumonia, unspecified: Secondary | ICD-10-CM | POA: Diagnosis not present

## 2021-04-15 DIAGNOSIS — F32A Depression, unspecified: Secondary | ICD-10-CM | POA: Diagnosis not present

## 2021-04-15 DIAGNOSIS — K5909 Other constipation: Secondary | ICD-10-CM | POA: Diagnosis not present

## 2021-06-14 DIAGNOSIS — E782 Mixed hyperlipidemia: Secondary | ICD-10-CM | POA: Diagnosis not present

## 2021-06-14 DIAGNOSIS — D500 Iron deficiency anemia secondary to blood loss (chronic): Secondary | ICD-10-CM | POA: Diagnosis not present

## 2021-06-21 DIAGNOSIS — Z1389 Encounter for screening for other disorder: Secondary | ICD-10-CM | POA: Diagnosis not present

## 2021-06-21 DIAGNOSIS — Z0000 Encounter for general adult medical examination without abnormal findings: Secondary | ICD-10-CM | POA: Diagnosis not present

## 2021-06-21 DIAGNOSIS — I2510 Atherosclerotic heart disease of native coronary artery without angina pectoris: Secondary | ICD-10-CM | POA: Diagnosis not present

## 2021-07-12 DIAGNOSIS — G250 Essential tremor: Secondary | ICD-10-CM | POA: Diagnosis not present

## 2021-07-12 DIAGNOSIS — E782 Mixed hyperlipidemia: Secondary | ICD-10-CM | POA: Diagnosis not present

## 2021-07-12 DIAGNOSIS — I25119 Atherosclerotic heart disease of native coronary artery with unspecified angina pectoris: Secondary | ICD-10-CM | POA: Diagnosis not present

## 2021-07-12 DIAGNOSIS — I10 Essential (primary) hypertension: Secondary | ICD-10-CM | POA: Diagnosis not present

## 2021-07-12 DIAGNOSIS — I341 Nonrheumatic mitral (valve) prolapse: Secondary | ICD-10-CM | POA: Diagnosis not present

## 2021-07-29 ENCOUNTER — Inpatient Hospital Stay (HOSPITAL_BASED_OUTPATIENT_CLINIC_OR_DEPARTMENT_OTHER): Payer: Medicare HMO | Admitting: Oncology

## 2021-07-29 ENCOUNTER — Inpatient Hospital Stay: Payer: Medicare HMO | Attending: Oncology

## 2021-07-29 ENCOUNTER — Encounter: Payer: Self-pay | Admitting: Oncology

## 2021-07-29 ENCOUNTER — Other Ambulatory Visit: Payer: Medicare HMO

## 2021-07-29 ENCOUNTER — Ambulatory Visit: Payer: Medicare HMO | Admitting: Oncology

## 2021-07-29 VITALS — BP 122/66 | HR 73 | Temp 96.3°F | Resp 16 | Wt 119.1 lb

## 2021-07-29 DIAGNOSIS — Z923 Personal history of irradiation: Secondary | ICD-10-CM | POA: Diagnosis not present

## 2021-07-29 DIAGNOSIS — J45909 Unspecified asthma, uncomplicated: Secondary | ICD-10-CM | POA: Diagnosis not present

## 2021-07-29 DIAGNOSIS — Z79899 Other long term (current) drug therapy: Secondary | ICD-10-CM | POA: Insufficient documentation

## 2021-07-29 DIAGNOSIS — D649 Anemia, unspecified: Secondary | ICD-10-CM | POA: Diagnosis not present

## 2021-07-29 DIAGNOSIS — I10 Essential (primary) hypertension: Secondary | ICD-10-CM | POA: Insufficient documentation

## 2021-07-29 DIAGNOSIS — Z86010 Personal history of colonic polyps: Secondary | ICD-10-CM | POA: Insufficient documentation

## 2021-07-29 DIAGNOSIS — R918 Other nonspecific abnormal finding of lung field: Secondary | ICD-10-CM | POA: Insufficient documentation

## 2021-07-29 DIAGNOSIS — Z08 Encounter for follow-up examination after completed treatment for malignant neoplasm: Secondary | ICD-10-CM

## 2021-07-29 DIAGNOSIS — Z8673 Personal history of transient ischemic attack (TIA), and cerebral infarction without residual deficits: Secondary | ICD-10-CM | POA: Insufficient documentation

## 2021-07-29 DIAGNOSIS — K219 Gastro-esophageal reflux disease without esophagitis: Secondary | ICD-10-CM | POA: Diagnosis not present

## 2021-07-29 DIAGNOSIS — E7800 Pure hypercholesterolemia, unspecified: Secondary | ICD-10-CM | POA: Insufficient documentation

## 2021-07-29 DIAGNOSIS — Z800 Family history of malignant neoplasm of digestive organs: Secondary | ICD-10-CM | POA: Insufficient documentation

## 2021-07-29 DIAGNOSIS — Z9221 Personal history of antineoplastic chemotherapy: Secondary | ICD-10-CM | POA: Insufficient documentation

## 2021-07-29 DIAGNOSIS — I341 Nonrheumatic mitral (valve) prolapse: Secondary | ICD-10-CM | POA: Diagnosis not present

## 2021-07-29 DIAGNOSIS — Z801 Family history of malignant neoplasm of trachea, bronchus and lung: Secondary | ICD-10-CM | POA: Diagnosis not present

## 2021-07-29 DIAGNOSIS — Z853 Personal history of malignant neoplasm of breast: Secondary | ICD-10-CM

## 2021-07-29 DIAGNOSIS — Z87891 Personal history of nicotine dependence: Secondary | ICD-10-CM | POA: Diagnosis not present

## 2021-07-29 DIAGNOSIS — C50312 Malignant neoplasm of lower-inner quadrant of left female breast: Secondary | ICD-10-CM

## 2021-07-29 LAB — CBC
HCT: 35.6 % — ABNORMAL LOW (ref 36.0–46.0)
Hemoglobin: 10.8 g/dL — ABNORMAL LOW (ref 12.0–15.0)
MCH: 24.1 pg — ABNORMAL LOW (ref 26.0–34.0)
MCHC: 30.3 g/dL (ref 30.0–36.0)
MCV: 79.5 fL — ABNORMAL LOW (ref 80.0–100.0)
Platelets: 275 10*3/uL (ref 150–400)
RBC: 4.48 MIL/uL (ref 3.87–5.11)
RDW: 16.6 % — ABNORMAL HIGH (ref 11.5–15.5)
WBC: 6.5 10*3/uL (ref 4.0–10.5)
nRBC: 0 % (ref 0.0–0.2)

## 2021-07-29 LAB — COMPREHENSIVE METABOLIC PANEL
ALT: 12 U/L (ref 0–44)
AST: 18 U/L (ref 15–41)
Albumin: 3.8 g/dL (ref 3.5–5.0)
Alkaline Phosphatase: 77 U/L (ref 38–126)
Anion gap: 6 (ref 5–15)
BUN: 14 mg/dL (ref 8–23)
CO2: 29 mmol/L (ref 22–32)
Calcium: 9 mg/dL (ref 8.9–10.3)
Chloride: 99 mmol/L (ref 98–111)
Creatinine, Ser: 0.87 mg/dL (ref 0.44–1.00)
GFR, Estimated: >60 mL/min (ref >60)
Glucose, Bld: 111 mg/dL — ABNORMAL HIGH (ref 70–99)
Potassium: 4.2 mmol/L (ref 3.5–5.1)
Sodium: 134 mmol/L — ABNORMAL LOW (ref 135–145)
Total Bilirubin: 0.8 mg/dL (ref 0.3–1.2)
Total Protein: 6.9 g/dL (ref 6.5–8.1)

## 2021-07-29 NOTE — Progress Notes (Signed)
? ? ? ?Hematology/Oncology Consult note ?[place]  ?[phone] [phone] ? ?Patient Care Team: ?Rusty Aus, MD as PCP - General (Internal Medicine) ?Sindy Guadeloupe, MD as Consulting Physician (Oncology) ?Herbert Pun, MD as Consulting Physician (General Surgery) ?Rico Junker, RN as Oncology Nurse Navigator ?Noreene Filbert, MD as Referring Physician (Radiation Oncology)  ? ?Name of the patient: Doris Barnes  ?[id number]  ?1941/02/10  ? ?Date of visit: 07/29/21 ? ?Diagnosis- Stage IA invasive mammary carcinoma of the left breast ER negative, PR weakly positive and HER-2 positive ?  ? ?Chief complaint/ Reason for visit- routine f/u of breast cancer ? ?Heme/Onc history:  Patient is a 80 year old female who was diagnosed with invasive mammary carcinoma of the left breast ER negative, PR 11 to 50% positive and HER-2/neu positive.  Tumor was grade 314 mm.  Lymph nodes were negative for malignancy.  She started adjuvant Taxol Herceptin chemotherapy in October 2018 and has completed 1 year of adjuvant Herceptin.  She is also completed adjuvant radiation.  She could not tolerate AI and did not wish to continue hormone therapy. ?  ?  ? ?Interval history-patient is doing well overall.  Denies any breast concerns.  Appetite and weight have remained stable. ? ?ECOG PS- 1 ?Pain scale- 0 ? ? ?Review of systems- Review of Systems  ?Constitutional:  Negative for chills, fever, malaise/fatigue and weight loss.  ?HENT:  Negative for congestion, ear discharge and nosebleeds.   ?Eyes:  Negative for blurred vision.  ?Respiratory:  Negative for cough, hemoptysis, sputum production, shortness of breath and wheezing.   ?Cardiovascular:  Negative for chest pain, palpitations, orthopnea and claudication.  ?Gastrointestinal:  Negative for abdominal pain, blood in stool, constipation, diarrhea, heartburn, melena, nausea and vomiting.  ?Genitourinary:  Negative for dysuria, flank  pain, frequency, hematuria and urgency.  ?Musculoskeletal:  Negative for back pain, joint pain and myalgias.  ?Skin:  Negative for rash.  ?Neurological:  Negative for dizziness, tingling, focal weakness, seizures, weakness and headaches.  ?Endo/Heme/Allergies:  Does not bruise/bleed easily.  ?Psychiatric/Behavioral:  Negative for depression and suicidal ideas. The patient does not have insomnia.    ? ? ?Allergies  ?Allergen Reactions  ? Imipramine Pamoate Shortness Of Breath  ? Bisphosphonates Nausea Only  ? Amoxicillin-Pot Clavulanate Rash  ? Epinephrine Hcl (Nasal) Other (See Comments)  ?  Difficulty breathing  ? Prednisone Palpitations  ? Sulfa Antibiotics Rash  ?  Face turns red and stings  ? ? ? ?Past Medical History:  ?Diagnosis Date  ? Anemia   ? Anxiety   ? Asthma   ? Breast cancer ([place]) 12/14/2016  ? left breast  ? Cancer ([place]) skin  ? Chronic vulvitis   ? Colon polyp   ? Cystocele   ? Diverticulitis   ? Diverticulitis   ? Diverticulosis   ? Diverticulosis   ? Dyspareunia, female   ? Dyspnea   ? Dysrhythmia   ? Fibrocystic disease of both breasts   ? Gastritis   ? GERD (gastroesophageal reflux disease)   ? gastritis also  ? Hemorrhoids   ? History of hiatal hernia   ? Hypercholesteremia   ? Hypertension   ? IBS (irritable bowel syndrome)   ? IC (interstitial cystitis)   ? Migraine   ? Mitral valve prolapse   ? OP (osteoporosis)   ? Personal history of chemotherapy   ? Personal history of radiation therapy   ? Positional vertigo   ? Rectocele   ?  Squamous cell carcinoma   ? Stroke [facility])   ? TIA  ? Vaginal atrophy   ? ? ? ?Past Surgical History:  ?Procedure Laterality Date  ? APPENDECTOMY    ? BREAST BIOPSY Left 1998  ? core bx- neg  ? BREAST BIOPSY Left 2007  ? neg  ? BREAST BIOPSY Left 12/14/2016  ? left breast US core positive  ? BREAST EXCISIONAL BIOPSY Left   ? BREAST LUMPECTOMY Left 01/01/2017  ?  INVASIVE MAMMARY CARCINOMA/Grade 3  ? CARDIAC CATHETERIZATION N/A 01/18/2015  ? Procedure: Left Heart Cath  and Coronary Angiography;  Surgeon: Teodoro Spray, MD;  Location: [facility];  Service: Cardiovascular;  Laterality: N/A;  ? CATARACT EXTRACTION W/PHACO Right 12/15/2015  ? Procedure: CATARACT EXTRACTION PHACO AND INTRAOCULAR LENS PLACEMENT (IOC);  Surgeon: Estill Cotta, MD;  Location: [facility] ORS;  Service: Ophthalmology;  Laterality: Right;  [place] 01:20 ?AP% 23.9 ?CDE 35.49 ?Fluid pack lot # [id number]  ? CATARACT EXTRACTION W/PHACO Left 12/29/2015  ? Procedure: CATARACT EXTRACTION PHACO AND INTRAOCULAR LENS PLACEMENT (IOC);  Surgeon: Estill Cotta, MD;  Location: [facility] ORS;  Service: Ophthalmology;  Laterality: Left;  [place]  01:20 ?AP% 25.1 ?CDE 32.86 ?Fluid pack lot # [id number]  ? COLON SURGERY    ? COLONOSCOPY WITH PROPOFOL N/A 02/21/2016  ? Procedure: COLONOSCOPY WITH PROPOFOL;  Surgeon: Manya Silvas, MD;  Location: [facility] ENDOSCOPY;  Service: Endoscopy;  Laterality: N/A;  ? COLONOSCOPY WITH PROPOFOL N/A 01/03/2019  ? Procedure: COLONOSCOPY WITH PROPOFOL;  Surgeon: Lollie Sails, MD;  Location: [facility] ENDOSCOPY;  Service: Endoscopy;  Laterality: N/A;  ? CORONARY ANGIOPLASTY    ? DILATION AND CURETTAGE OF UTERUS    ? ESOPHAGOGASTRODUODENOSCOPY (EGD) WITH PROPOFOL N/A 11/23/2017  ? Procedure: ESOPHAGOGASTRODUODENOSCOPY (EGD) WITH PROPOFOL;  Surgeon: Manya Silvas, MD;  Location: [facility] ENDOSCOPY;  Service: Endoscopy;  Laterality: N/A;  ? OOPHORECTOMY Bilateral   ? PARTIAL MASTECTOMY WITH NEEDLE LOCALIZATION Left 01/01/2017  ? Procedure: PARTIAL MASTECTOMY WITH NEEDLE LOCALIZATION;  Surgeon: Herbert Pun, MD;  Location: [facility] ORS;  Service: General;  Laterality: Left;  ? PORTACATH PLACEMENT Right 01/01/2017  ? Procedure: INSERTION PORT-A-CATH;  Surgeon: Herbert Pun, MD;  Location: [facility] ORS;  Service: General;  Laterality: Right;  ? SENTINEL NODE BIOPSY Left 01/01/2017  ? Procedure: SENTINEL NODE BIOPSY;  Surgeon: Herbert Pun, MD;  Location: [facility] ORS;  Service: General;   Laterality: Left;  ? TONSILLECTOMY    ? VAGINAL HYSTERECTOMY    ? ? ?Social History  ? ?Socioeconomic History  ? Marital status: Married  ?  Spouse name: Not on file  ? Number of children: Not on file  ? Years of education: Not on file  ? Highest education level: Not on file  ?Occupational History  ? Not on file  ?Tobacco Use  ? Smoking status: Former  ?  Packs/day: 1.00  ?  Years: 33.00  ?  Pack years: 33.00  ?  Types: Cigarettes  ?  Quit date: 10/17/1987  ?  Years since quitting: 33.8  ? Smokeless tobacco: Never  ?Vaping Use  ? Vaping Use: Never used  ?Substance and Sexual Activity  ? Alcohol use: No  ? Drug use: No  ? Sexual activity: Yes  ?Other Topics Concern  ? Not on file  ?Social History Narrative  ? Not on file  ? ?Social Determinants of Health  ? ?Financial Resource Strain: Not on file  ?Food Insecurity: Not on file  ?Transportation Needs: Not on file  ?Physical  Activity: Not on file  ?Stress: Not on file  ?Social Connections: Not on file  ?Intimate Partner Violence: Not on file  ? ? ?Family History  ?Problem Relation Age of Onset  ? Diabetes Mother   ? Diabetes Father   ? Colon cancer Sister   ? Diabetes Sister   ? Diabetes Maternal Aunt   ? Colon cancer Paternal Grandfather   ? Lung cancer Brother   ? Breast cancer Neg Hx   ? Ovarian cancer Neg Hx   ? ? ? ?Current Outpatient Medications:  ?  acidophilus (RISAQUAD) CAPS capsule, Take 2 capsules by mouth daily., Disp: , Rfl:  ?  ALPRAZolam (XANAX) 0.25 MG tablet, Take 0.25 mg by mouth at bedtime as needed for anxiety., Disp: , Rfl:  ?  Calcium Carbonate-Vitamin D 600-400 MG-UNIT tablet, Take 1 tablet by mouth 2 (two) times daily., Disp: , Rfl:  ?  FLUoxetine (PROZAC) 40 MG capsule, Take 40 mg by mouth daily., Disp: , Rfl:  ?  furosemide (LASIX) 20 MG tablet, Take 20 mg by mouth daily., Disp: , Rfl:  ?  lansoprazole (PREVACID) 30 MG capsule, Take 30 mg by mouth daily before breakfast. , Disp: , Rfl:  ?  Magnesium 250 MG TABS, Take 250 mg by mouth daily.,  Disp: , Rfl:  ?  Multiple Vitamins-Minerals (PRESERVISION AREDS 2) CAPS, Take 1 capsule by mouth 2 (two) times daily. , Disp: , Rfl:  ?  polyethylene glycol (MIRALAX / GLYCOLAX) packet, Take 17 g by mouth daily a

## 2021-08-10 DIAGNOSIS — H353131 Nonexudative age-related macular degeneration, bilateral, early dry stage: Secondary | ICD-10-CM | POA: Diagnosis not present

## 2021-08-10 DIAGNOSIS — Z0100 Encounter for examination of eyes and vision without abnormal findings: Secondary | ICD-10-CM | POA: Diagnosis not present

## 2021-08-24 DIAGNOSIS — L309 Dermatitis, unspecified: Secondary | ICD-10-CM | POA: Diagnosis not present

## 2021-08-24 DIAGNOSIS — L853 Xerosis cutis: Secondary | ICD-10-CM | POA: Diagnosis not present

## 2021-08-24 DIAGNOSIS — L728 Other follicular cysts of the skin and subcutaneous tissue: Secondary | ICD-10-CM | POA: Diagnosis not present

## 2021-08-24 DIAGNOSIS — L503 Dermatographic urticaria: Secondary | ICD-10-CM | POA: Diagnosis not present

## 2021-08-29 DIAGNOSIS — R0600 Dyspnea, unspecified: Secondary | ICD-10-CM | POA: Diagnosis not present

## 2021-08-29 DIAGNOSIS — R0609 Other forms of dyspnea: Secondary | ICD-10-CM | POA: Diagnosis not present

## 2021-08-29 DIAGNOSIS — F32A Depression, unspecified: Secondary | ICD-10-CM | POA: Diagnosis not present

## 2021-08-29 DIAGNOSIS — J8410 Pulmonary fibrosis, unspecified: Secondary | ICD-10-CM | POA: Diagnosis not present

## 2021-08-29 DIAGNOSIS — Z862 Personal history of diseases of the blood and blood-forming organs and certain disorders involving the immune mechanism: Secondary | ICD-10-CM | POA: Diagnosis not present

## 2021-09-08 ENCOUNTER — Encounter: Payer: Self-pay | Admitting: Radiation Oncology

## 2021-09-08 ENCOUNTER — Ambulatory Visit
Admission: RE | Admit: 2021-09-08 | Discharge: 2021-09-08 | Disposition: A | Discharge status: Home / Self Care | Payer: Medicare HMO | Source: Ambulatory Visit | Attending: Radiation Oncology | Admitting: Radiation Oncology

## 2021-09-08 VITALS — BP 126/58 | HR 72 | Temp 96.9°F | Resp 18 | Ht 61.0 in | Wt 119.1 lb

## 2021-09-08 DIAGNOSIS — Z853 Personal history of malignant neoplasm of breast: Secondary | ICD-10-CM | POA: Diagnosis not present

## 2021-09-08 DIAGNOSIS — Z08 Encounter for follow-up examination after completed treatment for malignant neoplasm: Secondary | ICD-10-CM | POA: Diagnosis not present

## 2021-09-08 DIAGNOSIS — Z171 Estrogen receptor negative status [ER-]: Secondary | ICD-10-CM

## 2021-09-08 DIAGNOSIS — Z923 Personal history of irradiation: Secondary | ICD-10-CM | POA: Insufficient documentation

## 2021-09-08 NOTE — Progress Notes (Signed)
Radiation Oncology Follow up Note  Name: Doris Barnes   Date:   09/08/2021 MRN:  [id number] DOB: 09/30/1940    This 81 y.o. female presents to the clinic today for over 4-year follow-up status post whole breast radiation to her left breast for stage I ER negative PR weakly positive HER2/neu overexpressed invasive mammary carcinoma.  REFERRING PROVIDER: Rusty Aus, MD  HPI: Patient is an 81 year old female now out over 4 years having completed whole breast radiation to her left breast for stage I seen today in routine follow-up she is doing well specifically denies breast tenderness cough or bone pain..  She had a mammogram about 6 months ago which I have reviewed BI-RADS 1 benign.  She also had a CT scan back in January for some abdominal complaints showing very little pathology does have some diverticulosis with superimposed acute inflammatory changes.  COMPLICATIONS OF TREATMENT: none  FOLLOW UP COMPLIANCE: keeps appointments   PHYSICAL EXAM:  BP (!) 126/58   Pulse 72   Temp (!) 96.9 F (36.1 C)   Resp 18   Ht '5\' 1"'  (1.549 m)   Wt 119 lb 1.6 oz (54 kg)   BMI 22.50 kg/m  Lungs are clear to A&P cardiac examination essentially unremarkable with regular rate and rhythm. No dominant mass or nodularity is noted in either breast in 2 positions examined. Incision is well-healed. No axillary or supraclavicular adenopathy is appreciated. Cosmetic result is excellent.  Well-developed well-nourished patient in NAD. HEENT reveals PERLA, EOMI, discs not visualized.  Oral cavity is clear. No oral mucosal lesions are identified. Neck is clear without evidence of cervical or supraclavicular adenopathy. Lungs are clear to A&P. Cardiac examination is essentially unremarkable with regular rate and rhythm without murmur rub or thrill. Abdomen is benign with no organomegaly or masses noted. Motor sensory and DTR levels are equal and symmetric in the upper and lower extremities. Cranial nerves II through  XII are grossly intact. Proprioception is intact. No peripheral adenopathy or edema is identified. No motor or sensory levels are noted. Crude visual fields are within normal range.  RADIOLOGY RESULTS: Mammograms and CT scan reviewed compatible with above-stated findings  PLAN: Present time patient is doing well at over 4 years with no evidence of disease.  I am going to discontinue follow-up care.  She will continue mammograms and follow-up care with her PMD I be happy to reevaluate the patient anytime should that be indicated.  Patient knows to call with any concerns.  I would like to take this opportunity to thank you for allowing me to participate in the care of your patient.Noreene Filbert, MD

## 2021-10-26 DIAGNOSIS — R002 Palpitations: Secondary | ICD-10-CM | POA: Diagnosis not present

## 2021-10-26 DIAGNOSIS — I700 Atherosclerosis of aorta: Secondary | ICD-10-CM | POA: Diagnosis not present

## 2021-10-26 DIAGNOSIS — I10 Essential (primary) hypertension: Secondary | ICD-10-CM | POA: Diagnosis not present

## 2021-10-26 DIAGNOSIS — F330 Major depressive disorder, recurrent, mild: Secondary | ICD-10-CM | POA: Diagnosis not present

## 2021-10-26 DIAGNOSIS — I25119 Atherosclerotic heart disease of native coronary artery with unspecified angina pectoris: Secondary | ICD-10-CM | POA: Diagnosis not present

## 2021-10-26 DIAGNOSIS — R0602 Shortness of breath: Secondary | ICD-10-CM | POA: Diagnosis not present

## 2021-10-26 DIAGNOSIS — I341 Nonrheumatic mitral (valve) prolapse: Secondary | ICD-10-CM | POA: Diagnosis not present

## 2021-10-26 DIAGNOSIS — E782 Mixed hyperlipidemia: Secondary | ICD-10-CM | POA: Diagnosis not present

## 2021-10-26 DIAGNOSIS — I459 Conduction disorder, unspecified: Secondary | ICD-10-CM | POA: Diagnosis not present

## 2021-11-15 DIAGNOSIS — I480 Paroxysmal atrial fibrillation: Secondary | ICD-10-CM | POA: Diagnosis not present

## 2021-11-21 DIAGNOSIS — N9089 Other specified noninflammatory disorders of vulva and perineum: Secondary | ICD-10-CM | POA: Diagnosis not present

## 2021-12-07 DIAGNOSIS — I341 Nonrheumatic mitral (valve) prolapse: Secondary | ICD-10-CM | POA: Diagnosis not present

## 2021-12-07 DIAGNOSIS — I480 Paroxysmal atrial fibrillation: Secondary | ICD-10-CM | POA: Diagnosis not present

## 2021-12-07 DIAGNOSIS — E782 Mixed hyperlipidemia: Secondary | ICD-10-CM | POA: Diagnosis not present

## 2021-12-07 DIAGNOSIS — I25119 Atherosclerotic heart disease of native coronary artery with unspecified angina pectoris: Secondary | ICD-10-CM | POA: Diagnosis not present

## 2021-12-07 DIAGNOSIS — I10 Essential (primary) hypertension: Secondary | ICD-10-CM | POA: Diagnosis not present

## 2021-12-19 DIAGNOSIS — E538 Deficiency of other specified B group vitamins: Secondary | ICD-10-CM | POA: Diagnosis not present

## 2021-12-19 DIAGNOSIS — E782 Mixed hyperlipidemia: Secondary | ICD-10-CM | POA: Diagnosis not present

## 2021-12-19 DIAGNOSIS — E559 Vitamin D deficiency, unspecified: Secondary | ICD-10-CM | POA: Diagnosis not present

## 2021-12-20 DIAGNOSIS — L900 Lichen sclerosus et atrophicus: Secondary | ICD-10-CM | POA: Diagnosis not present

## 2021-12-20 DIAGNOSIS — E538 Deficiency of other specified B group vitamins: Secondary | ICD-10-CM | POA: Diagnosis not present

## 2021-12-20 DIAGNOSIS — Z872 Personal history of diseases of the skin and subcutaneous tissue: Secondary | ICD-10-CM | POA: Diagnosis not present

## 2021-12-20 DIAGNOSIS — E782 Mixed hyperlipidemia: Secondary | ICD-10-CM | POA: Diagnosis not present

## 2021-12-20 DIAGNOSIS — N9089 Other specified noninflammatory disorders of vulva and perineum: Secondary | ICD-10-CM | POA: Diagnosis not present

## 2021-12-26 DIAGNOSIS — E538 Deficiency of other specified B group vitamins: Secondary | ICD-10-CM | POA: Diagnosis not present

## 2021-12-26 DIAGNOSIS — Z0000 Encounter for general adult medical examination without abnormal findings: Secondary | ICD-10-CM | POA: Diagnosis not present

## 2021-12-26 DIAGNOSIS — Z86010 Personal history of colonic polyps: Secondary | ICD-10-CM | POA: Diagnosis not present

## 2021-12-26 DIAGNOSIS — I480 Paroxysmal atrial fibrillation: Secondary | ICD-10-CM | POA: Diagnosis not present

## 2021-12-26 DIAGNOSIS — D509 Iron deficiency anemia, unspecified: Secondary | ICD-10-CM | POA: Diagnosis not present

## 2021-12-27 ENCOUNTER — Other Ambulatory Visit: Payer: Self-pay | Admitting: Oncology

## 2021-12-29 MED FILL — Iron Sucrose Inj 20 MG/ML (Fe Equiv): INTRAVENOUS | Qty: 10 | Status: AC

## 2021-12-30 ENCOUNTER — Inpatient Hospital Stay: Payer: Medicare HMO

## 2021-12-30 ENCOUNTER — Encounter: Payer: Self-pay | Admitting: Oncology

## 2021-12-30 ENCOUNTER — Inpatient Hospital Stay: Payer: Medicare HMO | Attending: Oncology | Admitting: Oncology

## 2021-12-30 VITALS — BP 121/57 | HR 72 | Temp 97.1°F | Resp 16

## 2021-12-30 VITALS — BP 129/57 | HR 67 | Temp 97.9°F | Resp 16 | Wt 115.6 lb

## 2021-12-30 DIAGNOSIS — T451X5A Adverse effect of antineoplastic and immunosuppressive drugs, initial encounter: Secondary | ICD-10-CM

## 2021-12-30 DIAGNOSIS — I341 Nonrheumatic mitral (valve) prolapse: Secondary | ICD-10-CM | POA: Insufficient documentation

## 2021-12-30 DIAGNOSIS — K219 Gastro-esophageal reflux disease without esophagitis: Secondary | ICD-10-CM | POA: Insufficient documentation

## 2021-12-30 DIAGNOSIS — G4700 Insomnia, unspecified: Secondary | ICD-10-CM | POA: Diagnosis not present

## 2021-12-30 DIAGNOSIS — D509 Iron deficiency anemia, unspecified: Secondary | ICD-10-CM

## 2021-12-30 DIAGNOSIS — Z87891 Personal history of nicotine dependence: Secondary | ICD-10-CM | POA: Diagnosis not present

## 2021-12-30 DIAGNOSIS — I10 Essential (primary) hypertension: Secondary | ICD-10-CM | POA: Diagnosis not present

## 2021-12-30 DIAGNOSIS — C50912 Malignant neoplasm of unspecified site of left female breast: Secondary | ICD-10-CM | POA: Diagnosis not present

## 2021-12-30 DIAGNOSIS — Z8673 Personal history of transient ischemic attack (TIA), and cerebral infarction without residual deficits: Secondary | ICD-10-CM | POA: Insufficient documentation

## 2021-12-30 DIAGNOSIS — M810 Age-related osteoporosis without current pathological fracture: Secondary | ICD-10-CM | POA: Diagnosis not present

## 2021-12-30 DIAGNOSIS — R5383 Other fatigue: Secondary | ICD-10-CM | POA: Insufficient documentation

## 2021-12-30 DIAGNOSIS — Z08 Encounter for follow-up examination after completed treatment for malignant neoplasm: Secondary | ICD-10-CM | POA: Diagnosis not present

## 2021-12-30 DIAGNOSIS — Z853 Personal history of malignant neoplasm of breast: Secondary | ICD-10-CM | POA: Diagnosis not present

## 2021-12-30 DIAGNOSIS — Z923 Personal history of irradiation: Secondary | ICD-10-CM | POA: Insufficient documentation

## 2021-12-30 DIAGNOSIS — E7800 Pure hypercholesterolemia, unspecified: Secondary | ICD-10-CM | POA: Diagnosis not present

## 2021-12-30 DIAGNOSIS — Z171 Estrogen receptor negative status [ER-]: Secondary | ICD-10-CM | POA: Insufficient documentation

## 2021-12-30 DIAGNOSIS — Z79899 Other long term (current) drug therapy: Secondary | ICD-10-CM | POA: Insufficient documentation

## 2021-12-30 DIAGNOSIS — Z7982 Long term (current) use of aspirin: Secondary | ICD-10-CM | POA: Insufficient documentation

## 2021-12-30 MED ORDER — SODIUM CHLORIDE 0.9 % IV SOLN
200.0000 mg | INTRAVENOUS | Status: DC
  Administered 2021-12-30: 200 mg via INTRAVENOUS
  Filled 2021-12-30: qty 200

## 2021-12-30 MED ORDER — SODIUM CHLORIDE 0.9 % IV SOLN
Freq: Once | INTRAVENOUS | Status: AC
  Filled 2021-12-30: qty 250

## 2021-12-30 NOTE — Patient Instructions (Signed)
[facility]  Discharge Instructions: Thank you for choosing [facility] to provide your oncology and hematology care.  If you have a lab appointment with the Cancer Center, please go directly to the Cancer Center and check in at the registration area.  Wear comfortable clothing and clothing appropriate for easy access to any Portacath or PICC line.   We strive to give you quality time with your provider. You may need to reschedule your appointment if you arrive late (15 or more minutes).  Arriving late affects you and other patients whose appointments are after yours.  Also, if you miss three or more appointments without notifying the office, you may be dismissed from the clinic at the provider's discretion.      For prescription refill requests, have your pharmacy contact our office and allow 72 hours for refills to be completed.    Today you received the following chemotherapy and/or immunotherapy agents Venofer.      To help prevent nausea and vomiting after your treatment, we encourage you to take your nausea medication as directed.  BELOW ARE SYMPTOMS THAT SHOULD BE REPORTED IMMEDIATELY: *FEVER GREATER THAN 100.4 F (38 C) OR HIGHER *CHILLS OR SWEATING *NAUSEA AND VOMITING THAT IS NOT CONTROLLED WITH YOUR NAUSEA MEDICATION *UNUSUAL SHORTNESS OF BREATH *UNUSUAL BRUISING OR BLEEDING *URINARY PROBLEMS (pain or burning when urinating, or frequent urination) *BOWEL PROBLEMS (unusual diarrhea, constipation, pain near the anus) TENDERNESS IN MOUTH AND THROAT WITH OR WITHOUT PRESENCE OF ULCERS (sore throat, sores in mouth, or a toothache) UNUSUAL RASH, SWELLING OR PAIN  UNUSUAL VAGINAL DISCHARGE OR ITCHING   Items with * indicate a potential emergency and should be followed up as soon as possible or go to the Emergency Department if any problems should occur.  Please show the CHEMOTHERAPY ALERT CARD or IMMUNOTHERAPY ALERT CARD at check-in to  the Emergency Department and triage nurse.  Should you have questions after your visit or need to cancel or reschedule your appointment, please contact [facility]  [phone] and follow the prompts.  Office hours are 8:00 a.m. to 4:30 p.m. Monday - Friday. Please note that voicemails left after 4:00 p.m. may not be returned until the following business day.  We are closed weekends and major holidays. You have access to a nurse at all times for urgent questions. Please call the main number to the clinic [phone] and follow the prompts.  For any non-urgent questions, you may also contact your provider using MyChart. We now offer e-Visits for anyone 18 and older to request care online for non-urgent symptoms. For details visit [email or web address].   Also download the MyChart app! Go to the app store, search "MyChart", open the app, select [facility], and log in with your MyChart username and password.  Masks are optional in the cancer centers. If you would like for your care team to wear a mask while they are taking care of you, please let them know. For doctor visits, patients may have with them one support person who is at least 81 years old. At this time, visitors are not allowed in the infusion area.   

## 2022-01-03 DIAGNOSIS — H43392 Other vitreous opacities, left eye: Secondary | ICD-10-CM | POA: Diagnosis not present

## 2022-01-03 DIAGNOSIS — H43813 Vitreous degeneration, bilateral: Secondary | ICD-10-CM | POA: Diagnosis not present

## 2022-01-03 DIAGNOSIS — H353132 Nonexudative age-related macular degeneration, bilateral, intermediate dry stage: Secondary | ICD-10-CM | POA: Diagnosis not present

## 2022-01-03 DIAGNOSIS — H26491 Other secondary cataract, right eye: Secondary | ICD-10-CM | POA: Diagnosis not present

## 2022-01-05 ENCOUNTER — Other Ambulatory Visit: Payer: Self-pay | Admitting: Internal Medicine

## 2022-01-05 ENCOUNTER — Inpatient Hospital Stay: Payer: Medicare HMO

## 2022-01-05 VITALS — BP 113/61 | HR 80 | Temp 96.8°F | Resp 17

## 2022-01-05 DIAGNOSIS — Z1231 Encounter for screening mammogram for malignant neoplasm of breast: Secondary | ICD-10-CM

## 2022-01-05 DIAGNOSIS — D509 Iron deficiency anemia, unspecified: Secondary | ICD-10-CM | POA: Diagnosis not present

## 2022-01-05 DIAGNOSIS — Z79899 Other long term (current) drug therapy: Secondary | ICD-10-CM | POA: Diagnosis not present

## 2022-01-05 DIAGNOSIS — K219 Gastro-esophageal reflux disease without esophagitis: Secondary | ICD-10-CM | POA: Diagnosis not present

## 2022-01-05 DIAGNOSIS — C50912 Malignant neoplasm of unspecified site of left female breast: Secondary | ICD-10-CM | POA: Diagnosis not present

## 2022-01-05 DIAGNOSIS — E7800 Pure hypercholesterolemia, unspecified: Secondary | ICD-10-CM | POA: Diagnosis not present

## 2022-01-05 DIAGNOSIS — Z171 Estrogen receptor negative status [ER-]: Secondary | ICD-10-CM | POA: Diagnosis not present

## 2022-01-05 DIAGNOSIS — I10 Essential (primary) hypertension: Secondary | ICD-10-CM | POA: Diagnosis not present

## 2022-01-05 DIAGNOSIS — T451X5A Adverse effect of antineoplastic and immunosuppressive drugs, initial encounter: Secondary | ICD-10-CM

## 2022-01-05 DIAGNOSIS — G4700 Insomnia, unspecified: Secondary | ICD-10-CM | POA: Diagnosis not present

## 2022-01-05 DIAGNOSIS — R5383 Other fatigue: Secondary | ICD-10-CM | POA: Diagnosis not present

## 2022-01-05 MED ORDER — SODIUM CHLORIDE 0.9 % IV SOLN
Freq: Once | INTRAVENOUS | Status: AC
  Filled 2022-01-05: qty 250

## 2022-01-05 MED ORDER — SODIUM CHLORIDE 0.9 % IV SOLN
200.0000 mg | INTRAVENOUS | Status: DC
  Administered 2022-01-05: 200 mg via INTRAVENOUS
  Filled 2022-01-05: qty 200

## 2022-01-05 NOTE — Patient Instructions (Signed)
[facility]  Discharge Instructions: Thank you for choosing [facility] to provide your oncology and hematology care.  If you have a lab appointment with the Cancer Center, please go directly to the Cancer Center and check in at the registration area.  Wear comfortable clothing and clothing appropriate for easy access to any Portacath or PICC line.   We strive to give you quality time with your provider. You may need to reschedule your appointment if you arrive late (15 or more minutes).  Arriving late affects you and other patients whose appointments are after yours.  Also, if you miss three or more appointments without notifying the office, you may be dismissed from the clinic at the provider's discretion.      For prescription refill requests, have your pharmacy contact our office and allow 72 hours for refills to be completed.    Today you received the following chemotherapy and/or immunotherapy agents Venofer.      To help prevent nausea and vomiting after your treatment, we encourage you to take your nausea medication as directed.  BELOW ARE SYMPTOMS THAT SHOULD BE REPORTED IMMEDIATELY: *FEVER GREATER THAN 100.4 F (38 C) OR HIGHER *CHILLS OR SWEATING *NAUSEA AND VOMITING THAT IS NOT CONTROLLED WITH YOUR NAUSEA MEDICATION *UNUSUAL SHORTNESS OF BREATH *UNUSUAL BRUISING OR BLEEDING *URINARY PROBLEMS (pain or burning when urinating, or frequent urination) *BOWEL PROBLEMS (unusual diarrhea, constipation, pain near the anus) TENDERNESS IN MOUTH AND THROAT WITH OR WITHOUT PRESENCE OF ULCERS (sore throat, sores in mouth, or a toothache) UNUSUAL RASH, SWELLING OR PAIN  UNUSUAL VAGINAL DISCHARGE OR ITCHING   Items with * indicate a potential emergency and should be followed up as soon as possible or go to the Emergency Department if any problems should occur.  Please show the CHEMOTHERAPY ALERT CARD or IMMUNOTHERAPY ALERT CARD at check-in to  the Emergency Department and triage nurse.  Should you have questions after your visit or need to cancel or reschedule your appointment, please contact [facility]  [phone] and follow the prompts.  Office hours are 8:00 a.m. to 4:30 p.m. Monday - Friday. Please note that voicemails left after 4:00 p.m. may not be returned until the following business day.  We are closed weekends and major holidays. You have access to a nurse at all times for urgent questions. Please call the main number to the clinic [phone] and follow the prompts.  For any non-urgent questions, you may also contact your provider using MyChart. We now offer e-Visits for anyone 18 and older to request care online for non-urgent symptoms. For details visit [email or web address].   Also download the MyChart app! Go to the app store, search "MyChart", open the app, select [facility], and log in with your MyChart username and password.  Masks are optional in the cancer centers. If you would like for your care team to wear a mask while they are taking care of you, please let them know. For doctor visits, patients may have with them one support person who is at least 81 years old. At this time, visitors are not allowed in the infusion area.   

## 2022-01-08 ENCOUNTER — Encounter: Payer: Self-pay | Admitting: Oncology

## 2022-01-08 NOTE — Progress Notes (Signed)
Hematology/Oncology Consult note [facility]  [phone] [phone]  Patient Care Team: Rusty Aus, MD as PCP - General (Internal Medicine) Sindy Guadeloupe, MD as Consulting Physician (Oncology) Herbert Pun, MD as Consulting Physician (General Surgery) Rico Junker, RN as Oncology Nurse Navigator Noreene Filbert, MD as Referring Physician (Radiation Oncology)   Name of the patient: Doris Barnes  [id number]  May 03, 1940   Date of visit: 01/08/22  Diagnosis- Stage IA invasive mammary carcinoma of the left breast ER negative, PR weakly positive and HER-2 positive  Chief complaint/ Reason for visit-patient now referred for iron deficiency anemia  Heme/Onc history: Patient is a 81 year old female who was diagnosed with invasive mammary carcinoma of the left breast ER negative, PR 11 to 50% positive and HER-2/neu positive.  Tumor was grade 314 mm.  Lymph nodes were negative for malignancy.  She started adjuvant Taxol Herceptin chemotherapy in October 2018 and has completed 1 year of adjuvant Herceptin.  She is also completed adjuvant radiation.  She could not tolerate AI and did not wish to continue hormone therapy.  Interval history- Patient reports feeling fatigued.  She denies any blood loss in her stool or urine.  Her last colonoscopy was with Dr. Gustavo Lah in 2020.  ECOG PS- 1 Pain scale- 0   Review of systems- Review of Systems  Constitutional:  Positive for malaise/fatigue. Negative for chills, fever and weight loss.  HENT:  Negative for congestion, ear discharge and nosebleeds.   Eyes:  Negative for blurred vision.  Respiratory:  Negative for cough, hemoptysis, sputum production, shortness of breath and wheezing.   Cardiovascular:  Negative for chest pain, palpitations, orthopnea and claudication.  Gastrointestinal:  Negative for abdominal pain, blood in stool, constipation, diarrhea, heartburn, melena, nausea and  vomiting.  Genitourinary:  Negative for dysuria, flank pain, frequency, hematuria and urgency.  Musculoskeletal:  Negative for back pain, joint pain and myalgias.  Skin:  Negative for rash.  Neurological:  Negative for dizziness, tingling, focal weakness, seizures, weakness and headaches.  Endo/Heme/Allergies:  Does not bruise/bleed easily.  Psychiatric/Behavioral:  Negative for depression and suicidal ideas. The patient does not have insomnia.       Allergies  Allergen Reactions   Imipramine Pamoate Shortness Of Breath   Bisphosphonates Nausea Only   Amoxicillin-Pot Clavulanate Rash   Epinephrine Hcl (Nasal) Other (See Comments)    Difficulty breathing   Prednisone Palpitations   Sulfa Antibiotics Rash    Face turns red and stings     Past Medical History:  Diagnosis Date   Anemia    Anxiety    Asthma    Breast cancer ([place]) 12/14/2016   left breast   Cancer ([place]) skin   Chronic vulvitis    Colon polyp    Cystocele    Diverticulitis    Diverticulitis    Diverticulosis    Diverticulosis    Dyspareunia, female    Dyspnea    Dysrhythmia    Fibrocystic disease of both breasts    Gastritis    GERD (gastroesophageal reflux disease)    gastritis also   Hemorrhoids    History of hiatal hernia    Hypercholesteremia    Hypertension    IBS (irritable bowel syndrome)    IC (interstitial cystitis)    Migraine    Mitral valve prolapse    OP (osteoporosis)    Personal history of chemotherapy    Personal history of radiation therapy    Positional vertigo  Rectocele    Squamous cell carcinoma    Stroke [facility])    TIA   Vaginal atrophy      Past Surgical History:  Procedure Laterality Date   APPENDECTOMY     BREAST BIOPSY Left 1998   core bx- neg   BREAST BIOPSY Left 2007   neg   BREAST BIOPSY Left 12/14/2016   left breast US core positive   BREAST EXCISIONAL BIOPSY Left    BREAST LUMPECTOMY Left 01/01/2017    INVASIVE MAMMARY CARCINOMA/Grade 3   CARDIAC  CATHETERIZATION N/A 01/18/2015   Procedure: Left Heart Cath and Coronary Angiography;  Surgeon: Teodoro Spray, MD;  Location: [place] [facility];  Service: Cardiovascular;  Laterality: N/A;   CATARACT EXTRACTION W/PHACO Right 12/15/2015   Procedure: CATARACT EXTRACTION PHACO AND INTRAOCULAR LENS PLACEMENT (IOC);  Surgeon: Estill Cotta, MD;  Location: [facility] ORS;  Service: Ophthalmology;  Laterality: Right;  [place] 01:20 AP% 23.9 CDE 35.49 Fluid pack lot # [id number]   CATARACT EXTRACTION W/PHACO Left 12/29/2015   Procedure: CATARACT EXTRACTION PHACO AND INTRAOCULAR LENS PLACEMENT (IOC);  Surgeon: Estill Cotta, MD;  Location: [facility] ORS;  Service: Ophthalmology;  Laterality: Left;  [place]  01:20 AP% 25.1 CDE 32.86 Fluid pack lot # [id number]   COLON SURGERY     COLONOSCOPY WITH PROPOFOL N/A 02/21/2016   Procedure: COLONOSCOPY WITH PROPOFOL;  Surgeon: Manya Silvas, MD;  Location: [facility] ENDOSCOPY;  Service: Endoscopy;  Laterality: N/A;   COLONOSCOPY WITH PROPOFOL N/A 01/03/2019   Procedure: COLONOSCOPY WITH PROPOFOL;  Surgeon: Lollie Sails, MD;  Location: [facility] ENDOSCOPY;  Service: Endoscopy;  Laterality: N/A;   CORONARY ANGIOPLASTY     DILATION AND CURETTAGE OF UTERUS     ESOPHAGOGASTRODUODENOSCOPY (EGD) WITH PROPOFOL N/A 11/23/2017   Procedure: ESOPHAGOGASTRODUODENOSCOPY (EGD) WITH PROPOFOL;  Surgeon: Manya Silvas, MD;  Location: [facility] ENDOSCOPY;  Service: Endoscopy;  Laterality: N/A;   OOPHORECTOMY Bilateral    PARTIAL MASTECTOMY WITH NEEDLE LOCALIZATION Left 01/01/2017   Procedure: PARTIAL MASTECTOMY WITH NEEDLE LOCALIZATION;  Surgeon: Herbert Pun, MD;  Location: [facility] ORS;  Service: General;  Laterality: Left;   PORTACATH PLACEMENT Right 01/01/2017   Procedure: INSERTION PORT-A-CATH;  Surgeon: Herbert Pun, MD;  Location: [facility] ORS;  Service: General;  Laterality: Right;   SENTINEL NODE BIOPSY Left 01/01/2017   Procedure: SENTINEL NODE BIOPSY;  Surgeon: Herbert Pun, MD;  Location: [facility] ORS;  Service: General;  Laterality: Left;   TONSILLECTOMY     VAGINAL HYSTERECTOMY      Social History   Socioeconomic History   Marital status: Married    Spouse name: Not on file   Number of children: Not on file   Years of education: Not on file   Highest education level: Not on file  Occupational History   Not on file  Tobacco Use   Smoking status: Former    Packs/day: 1.00    Years: 33.00    Total pack years: 33.00    Types: Cigarettes    Quit date: 10/17/1987    Years since quitting: 34.2   Smokeless tobacco: Never  Vaping Use   Vaping Use: Never used  Substance and Sexual Activity   Alcohol use: No   Drug use: No   Sexual activity: Yes  Other Topics Concern   Not on file  Social History Narrative   Not on file   Social Determinants of Health   Financial Resource Strain: Not on file  Food Insecurity: Not on file  Transportation Needs:  Not on file  Physical Activity: Not on file  Stress: Not on file  Social Connections: Not on file  Intimate Partner Violence: Not on file    Family History  Problem Relation Age of Onset   Diabetes Mother    Diabetes Father    Colon cancer Sister    Diabetes Sister    Diabetes Maternal Aunt    Colon cancer Paternal Grandfather    Lung cancer Brother    Breast cancer Neg Hx    Ovarian cancer Neg Hx      Current Outpatient Medications:    acidophilus (RISAQUAD) CAPS capsule, Take 2 capsules by mouth daily., Disp: , Rfl:    ALPRAZolam (XANAX) 0.25 MG tablet, Take 0.25 mg by mouth at bedtime as needed for anxiety., Disp: , Rfl:    Calcium Carbonate-Vitamin D 600-400 MG-UNIT tablet, Take 1 tablet by mouth 2 (two) times daily., Disp: , Rfl:    FLUoxetine (PROZAC) 40 MG capsule, Take 40 mg by mouth daily., Disp: , Rfl:    furosemide (LASIX) 20 MG tablet, Take 20 mg by mouth daily., Disp: , Rfl:    lansoprazole (PREVACID) 30 MG capsule, Take 30 mg by mouth daily before breakfast. , Disp: , Rfl:     Magnesium 250 MG TABS, Take 250 mg by mouth daily., Disp: , Rfl:    Probiotic Product (ALIGN PO), Take by mouth., Disp: , Rfl:    propranolol (INDERAL) 40 MG tablet, Take 40 mg by mouth 2 (two) times daily., Disp: , Rfl:    simvastatin (ZOCOR) 20 MG tablet, Take 20 mg by mouth daily at 8 pm., Disp: , Rfl:    spironolactone (ALDACTONE) 25 MG tablet, Take 25 mg by mouth daily., Disp: , Rfl:    acetaminophen (TYLENOL) 325 MG tablet, Take 650 mg by mouth every 6 (six) hours as needed for headache. (Patient not taking: Reported on 07/29/2021), Disp: , Rfl:    aspirin EC 81 MG tablet, Take 81 mg by mouth daily. Swallow whole. (Patient not taking: Reported on 07/29/2021), Disp: , Rfl:    dibucaine (NUPERCAINAL) 1 % OINT, Place 1 application rectally as needed for hemorrhoids., Disp: 28 g, Rfl: 0   gabapentin (NEURONTIN) 100 MG capsule, Take 300 mg by mouth at bedtime. (Patient not taking: Reported on 07/29/2021), Disp: , Rfl:    hydrocortisone (ANUSOL-HC) 25 MG suppository, Place 1 suppository (25 mg total) rectally every 12 (twelve) hours. (Patient not taking: Reported on 12/30/2021), Disp: 12 suppository, Rfl: 1   Multiple Vitamins-Minerals (PRESERVISION AREDS 2) CAPS, Take 1 capsule by mouth 2 (two) times daily.  (Patient not taking: Reported on 12/30/2021), Disp: , Rfl:    polyethylene glycol (MIRALAX / GLYCOLAX) packet, Take 17 g by mouth daily as needed for moderate constipation. (Patient not taking: Reported on 12/30/2021), Disp: , Rfl:    telmisartan (MICARDIS) 80 MG tablet, Take 40 mg by mouth daily with breakfast. (Patient not taking: Reported on 07/29/2021), Disp: , Rfl:    traZODone (DESYREL) 50 MG tablet, Take 50 mg by mouth at bedtime. (Patient not taking: Reported on 12/30/2021), Disp: , Rfl:   Physical exam:  Vitals:   12/30/21 1439  BP: (!) 129/57  Pulse: 67  Resp: 16  Temp: 97.9 F (36.6 C)  SpO2: 99%  Weight: 115 lb 9.6 oz (52.4 kg)   Physical Exam Constitutional:      General: She is  not in acute distress. Cardiovascular:     Rate and Rhythm: Normal rate and regular  rhythm.     Heart sounds: Normal heart sounds.  Pulmonary:     Effort: Pulmonary effort is normal.     Breath sounds: Normal breath sounds.  Abdominal:     General: Bowel sounds are normal.     Palpations: Abdomen is soft.  Skin:    General: Skin is warm and dry.  Neurological:     Mental Status: She is alert and oriented to person, place, and time.         Latest Ref Rng & Units 07/29/2021   10:40 AM  CMP  Glucose 70 - 99 mg/dL 111   BUN 8 - 23 mg/dL 14   Creatinine 0.44 - 1.00 mg/dL 0.87   Sodium 135 - 145 mmol/L 134   Potassium 3.5 - 5.1 mmol/L 4.2   Chloride 98 - 111 mmol/L 99   CO2 22 - 32 mmol/L 29   Calcium 8.9 - 10.3 mg/dL 9.0   Total Protein 6.5 - 8.1 g/dL 6.9   Total Bilirubin 0.3 - 1.2 mg/dL 0.8   Alkaline Phos 38 - 126 U/L 77   AST 15 - 41 U/L 18   ALT 0 - 44 U/L 12       Latest Ref Rng & Units 07/29/2021   10:40 AM  CBC  WBC 4.0 - 10.5 K/uL 6.5   Hemoglobin 12.0 - 15.0 g/dL 10.8   Hematocrit 36.0 - 46.0 % 35.6   Platelets 150 - 400 K/uL 275    Assessment and plan- Patient is a 81 y.o. female history of breast cancer now referred for iron deficiency anemia  Patient had recent labs done in September 2023 which shows a hemoglobin of 10.6 with an MCV of 78.5.  B12 levels were low at 274.  Her prior ferritin levels from June 2023 were low at 7.  She therefore has evidence of iron deficiency.  We will proceed with 5 doses of Venofer 200 mg IV.  Discussed risks and benefits of IV iron including all but not limited to possible allergic and anaphylactic reaction.  Patient understands and agrees to proceed as planned.  I will repeat CBC ferritin and iron studies 6 to 8 weeks from now and see her thereafter.  I will also refer her to [facility] for further evaluation of iron deficiency anemia   Visit Diagnosis 1. Iron deficiency anemia, unspecified iron deficiency anemia type   2. Encounter  for follow-up surveillance of breast cancer      Dr. Randa Evens, MD, MPH [facility] [phone] 01/08/2022 9:55 AM

## 2022-01-12 ENCOUNTER — Inpatient Hospital Stay: Payer: Medicare HMO

## 2022-01-12 VITALS — BP 145/60 | HR 78 | Temp 96.2°F | Resp 18

## 2022-01-12 DIAGNOSIS — G4700 Insomnia, unspecified: Secondary | ICD-10-CM | POA: Diagnosis not present

## 2022-01-12 DIAGNOSIS — Z79899 Other long term (current) drug therapy: Secondary | ICD-10-CM | POA: Diagnosis not present

## 2022-01-12 DIAGNOSIS — Z171 Estrogen receptor negative status [ER-]: Secondary | ICD-10-CM | POA: Diagnosis not present

## 2022-01-12 DIAGNOSIS — D509 Iron deficiency anemia, unspecified: Secondary | ICD-10-CM | POA: Diagnosis not present

## 2022-01-12 DIAGNOSIS — K219 Gastro-esophageal reflux disease without esophagitis: Secondary | ICD-10-CM | POA: Diagnosis not present

## 2022-01-12 DIAGNOSIS — E7800 Pure hypercholesterolemia, unspecified: Secondary | ICD-10-CM | POA: Diagnosis not present

## 2022-01-12 DIAGNOSIS — C50912 Malignant neoplasm of unspecified site of left female breast: Secondary | ICD-10-CM | POA: Diagnosis not present

## 2022-01-12 DIAGNOSIS — T451X5A Adverse effect of antineoplastic and immunosuppressive drugs, initial encounter: Secondary | ICD-10-CM

## 2022-01-12 DIAGNOSIS — R5383 Other fatigue: Secondary | ICD-10-CM | POA: Diagnosis not present

## 2022-01-12 DIAGNOSIS — I10 Essential (primary) hypertension: Secondary | ICD-10-CM | POA: Diagnosis not present

## 2022-01-12 MED ORDER — SODIUM CHLORIDE 0.9 % IV SOLN
Freq: Once | INTRAVENOUS | Status: AC
  Filled 2022-01-12: qty 250

## 2022-01-12 MED ORDER — SODIUM CHLORIDE 0.9 % IV SOLN
200.0000 mg | INTRAVENOUS | Status: DC
  Administered 2022-01-12: 200 mg via INTRAVENOUS
  Filled 2022-01-12: qty 200

## 2022-01-16 DIAGNOSIS — I480 Paroxysmal atrial fibrillation: Secondary | ICD-10-CM | POA: Diagnosis not present

## 2022-01-16 DIAGNOSIS — D509 Iron deficiency anemia, unspecified: Secondary | ICD-10-CM | POA: Diagnosis not present

## 2022-01-19 ENCOUNTER — Inpatient Hospital Stay: Payer: Medicare HMO

## 2022-01-19 VITALS — BP 131/67 | HR 69 | Temp 96.0°F | Resp 18

## 2022-01-19 DIAGNOSIS — G4700 Insomnia, unspecified: Secondary | ICD-10-CM | POA: Diagnosis not present

## 2022-01-19 DIAGNOSIS — E7800 Pure hypercholesterolemia, unspecified: Secondary | ICD-10-CM | POA: Diagnosis not present

## 2022-01-19 DIAGNOSIS — I10 Essential (primary) hypertension: Secondary | ICD-10-CM | POA: Diagnosis not present

## 2022-01-19 DIAGNOSIS — K219 Gastro-esophageal reflux disease without esophagitis: Secondary | ICD-10-CM | POA: Diagnosis not present

## 2022-01-19 DIAGNOSIS — Z79899 Other long term (current) drug therapy: Secondary | ICD-10-CM | POA: Diagnosis not present

## 2022-01-19 DIAGNOSIS — Z171 Estrogen receptor negative status [ER-]: Secondary | ICD-10-CM | POA: Diagnosis not present

## 2022-01-19 DIAGNOSIS — T451X5A Adverse effect of antineoplastic and immunosuppressive drugs, initial encounter: Secondary | ICD-10-CM

## 2022-01-19 DIAGNOSIS — C50912 Malignant neoplasm of unspecified site of left female breast: Secondary | ICD-10-CM | POA: Diagnosis not present

## 2022-01-19 DIAGNOSIS — R5383 Other fatigue: Secondary | ICD-10-CM | POA: Diagnosis not present

## 2022-01-19 DIAGNOSIS — D509 Iron deficiency anemia, unspecified: Secondary | ICD-10-CM | POA: Diagnosis not present

## 2022-01-19 MED ORDER — SODIUM CHLORIDE 0.9 % IV SOLN
Freq: Once | INTRAVENOUS | Status: AC
  Filled 2022-01-19: qty 250

## 2022-01-19 MED ORDER — SODIUM CHLORIDE 0.9 % IV SOLN
200.0000 mg | INTRAVENOUS | Status: DC
  Administered 2022-01-19: 200 mg via INTRAVENOUS
  Filled 2022-01-19: qty 200

## 2022-01-26 ENCOUNTER — Inpatient Hospital Stay: Payer: Medicare HMO | Attending: Oncology

## 2022-01-26 VITALS — BP 126/57 | HR 70 | Temp 96.5°F | Resp 16

## 2022-01-26 DIAGNOSIS — I341 Nonrheumatic mitral (valve) prolapse: Secondary | ICD-10-CM | POA: Insufficient documentation

## 2022-01-26 DIAGNOSIS — Z79899 Other long term (current) drug therapy: Secondary | ICD-10-CM | POA: Insufficient documentation

## 2022-01-26 DIAGNOSIS — I10 Essential (primary) hypertension: Secondary | ICD-10-CM | POA: Insufficient documentation

## 2022-01-26 DIAGNOSIS — Z9221 Personal history of antineoplastic chemotherapy: Secondary | ICD-10-CM | POA: Insufficient documentation

## 2022-01-26 DIAGNOSIS — Z86010 Personal history of colonic polyps: Secondary | ICD-10-CM | POA: Diagnosis not present

## 2022-01-26 DIAGNOSIS — Z8673 Personal history of transient ischemic attack (TIA), and cerebral infarction without residual deficits: Secondary | ICD-10-CM | POA: Insufficient documentation

## 2022-01-26 DIAGNOSIS — E7800 Pure hypercholesterolemia, unspecified: Secondary | ICD-10-CM | POA: Diagnosis not present

## 2022-01-26 DIAGNOSIS — K219 Gastro-esophageal reflux disease without esophagitis: Secondary | ICD-10-CM | POA: Diagnosis not present

## 2022-01-26 DIAGNOSIS — D509 Iron deficiency anemia, unspecified: Secondary | ICD-10-CM | POA: Diagnosis not present

## 2022-01-26 DIAGNOSIS — Z801 Family history of malignant neoplasm of trachea, bronchus and lung: Secondary | ICD-10-CM | POA: Diagnosis not present

## 2022-01-26 DIAGNOSIS — T451X5A Adverse effect of antineoplastic and immunosuppressive drugs, initial encounter: Secondary | ICD-10-CM

## 2022-01-26 DIAGNOSIS — Z7982 Long term (current) use of aspirin: Secondary | ICD-10-CM | POA: Insufficient documentation

## 2022-01-26 DIAGNOSIS — Z800 Family history of malignant neoplasm of digestive organs: Secondary | ICD-10-CM | POA: Diagnosis not present

## 2022-01-26 DIAGNOSIS — Z923 Personal history of irradiation: Secondary | ICD-10-CM | POA: Insufficient documentation

## 2022-01-26 DIAGNOSIS — Z853 Personal history of malignant neoplasm of breast: Secondary | ICD-10-CM | POA: Diagnosis not present

## 2022-01-26 MED ORDER — SODIUM CHLORIDE 0.9 % IV SOLN
Freq: Once | INTRAVENOUS | Status: AC
  Filled 2022-01-26: qty 250

## 2022-01-26 MED ORDER — SODIUM CHLORIDE 0.9 % IV SOLN
200.0000 mg | INTRAVENOUS | Status: DC
  Administered 2022-01-26: 200 mg via INTRAVENOUS
  Filled 2022-01-26: qty 200

## 2022-01-26 NOTE — Patient Instructions (Signed)

## 2022-01-30 ENCOUNTER — Ambulatory Visit: Payer: Medicare HMO | Admitting: Oncology

## 2022-02-01 ENCOUNTER — Telehealth: Payer: Self-pay | Admitting: *Deleted

## 2022-02-01 NOTE — Patient Outreach (Signed)
  Care Coordination   02/01/2022 Name: Doris Barnes MRN: [id number] DOB: 1940-04-16   Care Coordination Outreach Attempts:  An unsuccessful telephone outreach was attempted today to offer the patient information about available care coordination services as a benefit of their health plan.   Follow Up Plan:  Additional outreach attempts will be made to offer the patient care coordination information and services.   Encounter Outcome:  No Answer  Care Coordination Interventions Activated:  No   Care Coordination Interventions:  No, not indicated    Valente David, RN, MSN, [facility] Care Management Care Management Coordinator [phone]

## 2022-02-06 ENCOUNTER — Ambulatory Visit
Admission: RE | Admit: 2022-02-06 | Discharge: 2022-02-06 | Disposition: A | Discharge status: Home / Self Care | Payer: Medicare HMO | Source: Ambulatory Visit | Attending: Internal Medicine | Admitting: Internal Medicine

## 2022-02-06 ENCOUNTER — Telehealth: Payer: Self-pay | Admitting: *Deleted

## 2022-02-06 DIAGNOSIS — H353131 Nonexudative age-related macular degeneration, bilateral, early dry stage: Secondary | ICD-10-CM | POA: Diagnosis not present

## 2022-02-06 DIAGNOSIS — Z0100 Encounter for examination of eyes and vision without abnormal findings: Secondary | ICD-10-CM | POA: Diagnosis not present

## 2022-02-06 DIAGNOSIS — H524 Presbyopia: Secondary | ICD-10-CM | POA: Diagnosis not present

## 2022-02-06 DIAGNOSIS — Z1231 Encounter for screening mammogram for malignant neoplasm of breast: Secondary | ICD-10-CM | POA: Diagnosis not present

## 2022-02-06 NOTE — Patient Outreach (Signed)
  Care Coordination   02/06/2022 Name: Doris Barnes MRN: [id number] DOB: 1940/05/16   Care Coordination Outreach Attempts:  A second unsuccessful outreach was attempted today to offer the patient with information about available care coordination services as a benefit of their health plan.     Follow Up Plan:  Additional outreach attempts will be made to offer the patient care coordination information and services.   Encounter Outcome:  No Answer  Care Coordination Interventions Activated:  No   Care Coordination Interventions:  No, not indicated    Valente David, RN, MSN, [facility] Care Management Care Management Coordinator [phone]

## 2022-02-10 ENCOUNTER — Other Ambulatory Visit: Payer: Self-pay

## 2022-02-10 ENCOUNTER — Telehealth: Payer: Self-pay | Admitting: *Deleted

## 2022-02-10 DIAGNOSIS — D701 Agranulocytosis secondary to cancer chemotherapy: Secondary | ICD-10-CM

## 2022-02-10 NOTE — Patient Outreach (Signed)
  Care Coordination   Initial Visit Note   02/10/2022 Name: LATALIA ETZLER MRN: [id number] DOB: 12-Oct-1940  MARIANNA CID is a 81 y.o. year old female who sees Rusty Aus, MD for primary care. I spoke with  [name] by phone today.  What matters to the patients health and wellness today?  State she does not have many health issues, denies needing assistance with management but she is agreeable to receiving program information in the mail.    SDOH assessments and interventions completed:  No     Care Coordination Interventions Activated:  Yes  Care Coordination Interventions:  Yes, provided   Follow up plan: No further intervention required.   Encounter Outcome:  Pt. Refused   Valente David, RN, MSN, [place] Care Management Care Management Coordinator [phone]

## 2022-02-13 ENCOUNTER — Inpatient Hospital Stay (HOSPITAL_BASED_OUTPATIENT_CLINIC_OR_DEPARTMENT_OTHER): Payer: Medicare HMO | Admitting: Oncology

## 2022-02-13 ENCOUNTER — Inpatient Hospital Stay: Payer: Medicare HMO

## 2022-02-13 VITALS — BP 150/57 | HR 72 | Temp 98.3°F | Resp 16 | Wt 114.5 lb

## 2022-02-13 DIAGNOSIS — I341 Nonrheumatic mitral (valve) prolapse: Secondary | ICD-10-CM | POA: Diagnosis not present

## 2022-02-13 DIAGNOSIS — K219 Gastro-esophageal reflux disease without esophagitis: Secondary | ICD-10-CM | POA: Diagnosis not present

## 2022-02-13 DIAGNOSIS — Z79899 Other long term (current) drug therapy: Secondary | ICD-10-CM | POA: Diagnosis not present

## 2022-02-13 DIAGNOSIS — Z9221 Personal history of antineoplastic chemotherapy: Secondary | ICD-10-CM | POA: Diagnosis not present

## 2022-02-13 DIAGNOSIS — Z853 Personal history of malignant neoplasm of breast: Secondary | ICD-10-CM | POA: Diagnosis not present

## 2022-02-13 DIAGNOSIS — Z923 Personal history of irradiation: Secondary | ICD-10-CM | POA: Diagnosis not present

## 2022-02-13 DIAGNOSIS — T451X5A Adverse effect of antineoplastic and immunosuppressive drugs, initial encounter: Secondary | ICD-10-CM

## 2022-02-13 DIAGNOSIS — D508 Other iron deficiency anemias: Secondary | ICD-10-CM

## 2022-02-13 DIAGNOSIS — D509 Iron deficiency anemia, unspecified: Secondary | ICD-10-CM | POA: Diagnosis not present

## 2022-02-13 DIAGNOSIS — E7800 Pure hypercholesterolemia, unspecified: Secondary | ICD-10-CM | POA: Diagnosis not present

## 2022-02-13 DIAGNOSIS — I10 Essential (primary) hypertension: Secondary | ICD-10-CM | POA: Diagnosis not present

## 2022-02-13 LAB — CBC WITH DIFFERENTIAL/PLATELET
Abs Immature Granulocytes: 0.02 10*3/uL (ref 0.00–0.07)
Basophils Absolute: 0.1 10*3/uL (ref 0.0–0.1)
Basophils Relative: 1 %
Eosinophils Absolute: 0.3 10*3/uL (ref 0.0–0.5)
Eosinophils Relative: 4 %
HCT: 45.1 % (ref 36.0–46.0)
Hemoglobin: 14.6 g/dL (ref 12.0–15.0)
Immature Granulocytes: 0 %
Lymphocytes Relative: 29 %
Lymphs Abs: 2.4 10*3/uL (ref 0.7–4.0)
MCH: 27.1 pg (ref 26.0–34.0)
MCHC: 32.4 g/dL (ref 30.0–36.0)
MCV: 83.8 fL (ref 80.0–100.0)
Monocytes Absolute: 0.8 10*3/uL (ref 0.1–1.0)
Monocytes Relative: 10 %
Neutro Abs: 4.5 10*3/uL (ref 1.7–7.7)
Neutrophils Relative %: 56 %
Platelets: 231 10*3/uL (ref 150–400)
RBC: 5.38 MIL/uL — ABNORMAL HIGH (ref 3.87–5.11)
RDW: 21 % — ABNORMAL HIGH (ref 11.5–15.5)
WBC: 8.1 10*3/uL (ref 4.0–10.5)
nRBC: 0 % (ref 0.0–0.2)

## 2022-02-13 LAB — IRON AND TIBC
Iron: 117 ug/dL (ref 28–170)
Saturation Ratios: 34 % — ABNORMAL HIGH (ref 10.4–31.8)
TIBC: 344 ug/dL (ref 250–450)
UIBC: 227 ug/dL

## 2022-02-13 LAB — FERRITIN: Ferritin: 182 ng/mL (ref 11–307)

## 2022-02-13 NOTE — Progress Notes (Signed)
Pt states she keeps losing weight; but doesnt want any food

## 2022-02-13 NOTE — Progress Notes (Signed)
Hematology/Oncology Consult note [facility]  Telephone:([phone] [phone]  Patient Care Team: Rusty Aus, MD as PCP - General (Internal Medicine) Sindy Guadeloupe, MD as Consulting Physician (Oncology) Herbert Pun, MD as Consulting Physician (General Surgery) Rico Junker, RN as Oncology Nurse Navigator Noreene Filbert, MD as Referring Physician (Radiation Oncology)   Name of the patient: Doris Barnes  [id number]  December 05, 1940   Date of visit: 02/13/22  Diagnosis- Stage IA invasive mammary carcinoma of the left breast ER negative, PR weakly positive and HER-2 positive  History of iron deficiency anemia  Chief complaint/ Reason for visit-routine follow-up of iron deficiency anemia  Heme/Onc history: Patient is a 81 year old female who was diagnosed with invasive mammary carcinoma of the left breast ER negative, PR 11 to 50% positive and HER-2/neu positive.  Tumor was grade 314 mm.  Lymph nodes were negative for malignancy.  She started adjuvant Taxol Herceptin chemotherapy in October 2018 and has completed 1 year of adjuvant Herceptin.  She is also completed adjuvant radiation.  She could not tolerate AI and did not wish to continue hormone therapy.   Patient referred for iron deficiency anemia in October 2023 and received IV iron for the same.  Interval history-patient reports improvement in her energy levels after receiving IV iron.  Denies any blood loss in her stool or urine.  ECOG PS- 1 Pain scale- 0   Review of systems- Review of Systems  Constitutional:  Negative for chills, fever, malaise/fatigue and weight loss.  HENT:  Negative for congestion, ear discharge and nosebleeds.   Eyes:  Negative for blurred vision.  Respiratory:  Negative for cough, hemoptysis, sputum production, shortness of breath and wheezing.   Cardiovascular:  Negative for chest pain, palpitations, orthopnea and claudication.  Gastrointestinal:   Negative for abdominal pain, blood in stool, constipation, diarrhea, heartburn, melena, nausea and vomiting.  Genitourinary:  Negative for dysuria, flank pain, frequency, hematuria and urgency.  Musculoskeletal:  Negative for back pain, joint pain and myalgias.  Skin:  Negative for rash.  Neurological:  Negative for dizziness, tingling, focal weakness, seizures, weakness and headaches.  Endo/Heme/Allergies:  Does not bruise/bleed easily.  Psychiatric/Behavioral:  Negative for depression and suicidal ideas. The patient does not have insomnia.       Allergies  Allergen Reactions   Imipramine Pamoate Shortness Of Breath   Bisphosphonates Nausea Only   Amoxicillin-Pot Clavulanate Rash   Epinephrine Hcl (Nasal) Other (See Comments)    Difficulty breathing   Prednisone Palpitations   Sulfa Antibiotics Rash    Face turns red and stings     Past Medical History:  Diagnosis Date   Anemia    Anxiety    Asthma    Breast cancer ([place]) 12/14/2016   left breast   Cancer ([place]) skin   Chronic vulvitis    Colon polyp    Cystocele    Diverticulitis    Diverticulitis    Diverticulosis    Diverticulosis    Dyspareunia, female    Dyspnea    Dysrhythmia    Fibrocystic disease of both breasts    Gastritis    GERD (gastroesophageal reflux disease)    gastritis also   Hemorrhoids    History of hiatal hernia    Hypercholesteremia    Hypertension    IBS (irritable bowel syndrome)    IC (interstitial cystitis)    Migraine    Mitral valve prolapse    OP (osteoporosis)    Personal history  of chemotherapy    Personal history of radiation therapy    Positional vertigo    Rectocele    Squamous cell carcinoma    Stroke [facility])    TIA   Vaginal atrophy      Past Surgical History:  Procedure Laterality Date   APPENDECTOMY     BREAST BIOPSY Left 1998   core bx- neg   BREAST BIOPSY Left 2007   neg   BREAST BIOPSY Left 12/14/2016   left breast US core positive   BREAST EXCISIONAL  BIOPSY Left    BREAST LUMPECTOMY Left 01/01/2017    INVASIVE MAMMARY CARCINOMA/Grade 3   CARDIAC CATHETERIZATION N/A 01/18/2015   Procedure: Left Heart Cath and Coronary Angiography;  Surgeon: Teodoro Spray, MD;  Location: [place] [facility];  Service: Cardiovascular;  Laterality: N/A;   CATARACT EXTRACTION W/PHACO Right 12/15/2015   Procedure: CATARACT EXTRACTION PHACO AND INTRAOCULAR LENS PLACEMENT (IOC);  Surgeon: Estill Cotta, MD;  Location: [facility] ORS;  Service: Ophthalmology;  Laterality: Right;  [place] 01:20 AP% 23.9 CDE 35.49 Fluid pack lot # [id number]   CATARACT EXTRACTION W/PHACO Left 12/29/2015   Procedure: CATARACT EXTRACTION PHACO AND INTRAOCULAR LENS PLACEMENT (IOC);  Surgeon: Estill Cotta, MD;  Location: [facility] OR[facility];  Service: Ophthalmology;  Laterality: Left;  [place]  01:20 AP% 25.1 CDE 32.86 Fluid pack lot # [id number]   COLON SURGERY     COLONOSCOPY WITH PROPOFOL N/A 02/21/2016   Procedure: COLONOSCOPY WITH PROPOFOL;  Surgeon: Manya Silvas, MD;  Location: [facility] ENDOSCOPY;  Service: Endoscopy;  Laterality: N/A;   COLONOSCOPY WITH PROPOFOL N/A 01/03/2019   Procedure: COLONOSCOPY WITH PROPOFOL;  Surgeon: Lollie Sails, MD;  Location: [facility] ENDOSCOPY;  Service: Endoscopy;  Laterality: N/A;   CORONARY ANGIOPLASTY     DILATION AND CURETTAGE OF UTERUS     ESOPHAGOGASTRODUODENOSCOPY (EGD) WITH PROPOFOL N/A 11/23/2017   Procedure: ESOPHAGOGASTRODUODENOSCOPY (EGD) WITH PROPOFOL;  Surgeon: Manya Silvas, MD;  Location: [facility] ENDOSCOPY;  Service: Endoscopy;  Laterality: N/A;   OOPHORECTOMY Bilateral    PARTIAL MASTECTOMY WITH NEEDLE LOCALIZATION Left 01/01/2017   Procedure: PARTIAL MASTECTOMY WITH NEEDLE LOCALIZATION;  Surgeon: Herbert Pun, MD;  Location: [facility] ORS;  Service: General;  Laterality: Left;   PORTACATH PLACEMENT Right 01/01/2017   Procedure: INSERTION PORT-A-CATH;  Surgeon: Herbert Pun, MD;  Location: [facility] ORS;  Service: General;  Laterality: Right;    SENTINEL NODE BIOPSY Left 01/01/2017   Procedure: SENTINEL NODE BIOPSY;  Surgeon: Herbert Pun, MD;  Location: [facility] ORS;  Service: General;  Laterality: Left;   TONSILLECTOMY     VAGINAL HYSTERECTOMY      Social History   Socioeconomic History   Marital status: Married    Spouse name: Not on file   Number of children: Not on file   Years of education: Not on file   Highest education level: Not on file  Occupational History   Not on file  Tobacco Use   Smoking status: Former    Packs/day: 1.00    Years: 33.00    Total pack years: 33.00    Types: Cigarettes    Quit date: 10/17/1987    Years since quitting: 34.3   Smokeless tobacco: Never  Vaping Use   Vaping Use: Never used  Substance and Sexual Activity   Alcohol use: No   Drug use: No   Sexual activity: Yes  Other Topics Concern   Not on file  Social History Narrative   Not on file   Social Determinants of  Health   Financial Resource Strain: Not on file  Food Insecurity: Not on file  Transportation Needs: Not on file  Physical Activity: Not on file  Stress: Not on file  Social Connections: Not on file  Intimate Partner Violence: Not on file    Family History  Problem Relation Age of Onset   Diabetes Mother    Diabetes Father    Colon cancer Sister    Diabetes Sister    Diabetes Maternal Aunt    Colon cancer Paternal Grandfather    Lung cancer Brother    Breast cancer Neg Hx    Ovarian cancer Neg Hx      Current Outpatient Medications:    ALPRAZolam (XANAX) 0.25 MG tablet, Take 0.25 mg by mouth at bedtime as needed for anxiety., Disp: , Rfl:    Calcium Carbonate-Vitamin D 600-400 MG-UNIT tablet, Take 1 tablet by mouth 2 (two) times daily., Disp: , Rfl:    FLUoxetine (PROZAC) 40 MG capsule, Take 40 mg by mouth daily., Disp: , Rfl:    furosemide (LASIX) 20 MG tablet, Take 20 mg by mouth daily., Disp: , Rfl:    lansoprazole (PREVACID) 30 MG capsule, Take 30 mg by mouth daily before breakfast. ,  Disp: , Rfl:    Magnesium 250 MG TABS, Take 250 mg by mouth daily., Disp: , Rfl:    Multiple Vitamins-Minerals (PRESERVISION AREDS 2) CAPS, Take 1 capsule by mouth 2 (two) times daily., Disp: , Rfl:    propranolol (INDERAL) 40 MG tablet, Take 40 mg by mouth 2 (two) times daily., Disp: , Rfl:    simvastatin (ZOCOR) 20 MG tablet, Take 20 mg by mouth daily at 8 pm., Disp: , Rfl:    spironolactone (ALDACTONE) 25 MG tablet, Take 25 mg by mouth daily., Disp: , Rfl:    acetaminophen (TYLENOL) 325 MG tablet, Take 650 mg by mouth every 6 (six) hours as needed for headache. (Patient not taking: Reported on 07/29/2021), Disp: , Rfl:    acidophilus (RISAQUAD) CAPS capsule, Take 2 capsules by mouth daily. (Patient not taking: Reported on 02/13/2022), Disp: , Rfl:    aspirin EC 81 MG tablet, Take 81 mg by mouth daily. Swallow whole. (Patient not taking: Reported on 02/13/2022), Disp: , Rfl:    dibucaine (NUPERCAINAL) 1 % OINT, Place 1 application rectally as needed for hemorrhoids., Disp: 28 g, Rfl: 0   gabapentin (NEURONTIN) 100 MG capsule, Take 300 mg by mouth at bedtime. (Patient not taking: Reported on 07/29/2021), Disp: , Rfl:    hydrocortisone (ANUSOL-HC) 25 MG suppository, Place 1 suppository (25 mg total) rectally every 12 (twelve) hours. (Patient not taking: Reported on 12/30/2021), Disp: 12 suppository, Rfl: 1   polyethylene glycol (MIRALAX / GLYCOLAX) packet, Take 17 g by mouth daily as needed for moderate constipation. (Patient not taking: Reported on 12/30/2021), Disp: , Rfl:    Probiotic Product (ALIGN PO), Take by mouth., Disp: , Rfl:    telmisartan (MICARDIS) 80 MG tablet, Take 40 mg by mouth daily with breakfast. (Patient not taking: Reported on 02/13/2022), Disp: , Rfl:    traZODone (DESYREL) 50 MG tablet, Take 50 mg by mouth at bedtime. (Patient not taking: Reported on 02/13/2022), Disp: , Rfl:   Physical exam:  Vitals:   02/13/22 1452  BP: (!) 150/57  Pulse: 72  Resp: 16  Temp: 98.3 F (36.8  C)  SpO2: 100%  Weight: 114 lb 8 oz (51.9 kg)   Physical Exam Constitutional:      General: She  is not in acute distress. Cardiovascular:     Rate and Rhythm: Normal rate and regular rhythm.     Heart sounds: Normal heart sounds.  Pulmonary:     Effort: Pulmonary effort is normal.     Breath sounds: Normal breath sounds.  Abdominal:     General: Bowel sounds are normal.     Palpations: Abdomen is soft.  Skin:    General: Skin is warm and dry.  Neurological:     Mental Status: She is alert and oriented to person, place, and time.         Latest Ref Rng & Units 07/29/2021   10:40 AM  CMP  Glucose 70 - 99 mg/dL 111   BUN 8 - 23 mg/dL 14   Creatinine 0.44 - 1.00 mg/dL 0.87   Sodium 135 - 145 mmol/L 134   Potassium 3.5 - 5.1 mmol/L 4.2   Chloride 98 - 111 mmol/L 99   CO2 22 - 32 mmol/L 29   Calcium 8.9 - 10.3 mg/dL 9.0   Total Protein 6.5 - 8.1 g/dL 6.9   Total Bilirubin 0.3 - 1.2 mg/dL 0.8   Alkaline Phos 38 - 126 U/L 77   AST 15 - 41 U/L 18   ALT 0 - 44 U/L 12       Latest Ref Rng & Units 02/13/2022    2:34 PM  CBC  WBC 4.0 - 10.5 K/uL 8.1   Hemoglobin 12.0 - 15.0 g/dL 14.6   Hematocrit 36.0 - 46.0 % 45.1   Platelets 150 - 400 K/uL 231     No images are attached to the encounter.  MM 3D SCREEN BREAST BILATERAL  Result Date: 02/08/2022 CLINICAL DATA:  Screening. EXAM: DIGITAL SCREENING BILATERAL MAMMOGRAM WITH TOMOSYNTHESIS AND CAD TECHNIQUE: Bilateral screening digital craniocaudal and mediolateral oblique mammograms were obtained. Bilateral screening digital breast tomosynthesis was performed. The images were evaluated with computer-aided detection. COMPARISON:  Previous exam(s). ACR Breast Density Category b: There are scattered areas of fibroglandular density. FINDINGS: There are no findings suspicious for malignancy. IMPRESSION: No mammographic evidence of malignancy. A result letter of this screening mammogram will be mailed directly to the patient.  RECOMMENDATION: Screening mammogram in one year. (Code:SM-B-01Y) BI-RADS CATEGORY  1: Negative. Electronically Signed   By: Abelardo Diesel M.D.   On: 02/08/2022 11:00     Assessment and plan- Patient is a 81 y.o. female who is here for routine follow-up of iron deficiency anemia  Patient's hemoglobin was down to 10.6 in October 2023 and after receiving IV iron it is back up to 14.6.  Iron studies are within normal limits.  I will repeat CBC ferritin and iron studies in 3 in 6 months and I will see her back in 6 months.  I have encouraged her to speak to GI about the need for endoscopy evaluation given her iron deficiency.  With regards to her history of breast cancer when now nearing 5 years of surveillance and I will see her back in 6 months for a breast exam   Visit Diagnosis 1. Other iron deficiency anemia      Dr. Randa Evens, MD, MPH [facility] [phone] 02/13/2022 4:56 PM

## 2022-02-27 DIAGNOSIS — R112 Nausea with vomiting, unspecified: Secondary | ICD-10-CM | POA: Diagnosis not present

## 2022-02-27 DIAGNOSIS — J40 Bronchitis, not specified as acute or chronic: Secondary | ICD-10-CM | POA: Diagnosis not present

## 2022-03-08 DIAGNOSIS — M810 Age-related osteoporosis without current pathological fracture: Secondary | ICD-10-CM | POA: Diagnosis not present

## 2022-03-08 DIAGNOSIS — E782 Mixed hyperlipidemia: Secondary | ICD-10-CM | POA: Diagnosis not present

## 2022-03-08 DIAGNOSIS — I480 Paroxysmal atrial fibrillation: Secondary | ICD-10-CM | POA: Diagnosis not present

## 2022-03-08 DIAGNOSIS — I25119 Atherosclerotic heart disease of native coronary artery with unspecified angina pectoris: Secondary | ICD-10-CM | POA: Diagnosis not present

## 2022-03-08 DIAGNOSIS — I341 Nonrheumatic mitral (valve) prolapse: Secondary | ICD-10-CM | POA: Diagnosis not present

## 2022-03-08 DIAGNOSIS — I10 Essential (primary) hypertension: Secondary | ICD-10-CM | POA: Diagnosis not present

## 2022-03-22 DIAGNOSIS — N9089 Other specified noninflammatory disorders of vulva and perineum: Secondary | ICD-10-CM | POA: Diagnosis not present

## 2022-03-22 DIAGNOSIS — Z872 Personal history of diseases of the skin and subcutaneous tissue: Secondary | ICD-10-CM | POA: Diagnosis not present

## 2022-03-24 DIAGNOSIS — D2261 Melanocytic nevi of right upper limb, including shoulder: Secondary | ICD-10-CM | POA: Diagnosis not present

## 2022-03-24 DIAGNOSIS — L298 Other pruritus: Secondary | ICD-10-CM | POA: Diagnosis not present

## 2022-03-24 DIAGNOSIS — D2262 Melanocytic nevi of left upper limb, including shoulder: Secondary | ICD-10-CM | POA: Diagnosis not present

## 2022-03-24 DIAGNOSIS — D225 Melanocytic nevi of trunk: Secondary | ICD-10-CM | POA: Diagnosis not present

## 2022-03-30 DIAGNOSIS — D500 Iron deficiency anemia secondary to blood loss (chronic): Secondary | ICD-10-CM | POA: Diagnosis not present

## 2022-03-30 DIAGNOSIS — E538 Deficiency of other specified B group vitamins: Secondary | ICD-10-CM | POA: Diagnosis not present

## 2022-04-18 DIAGNOSIS — R634 Abnormal weight loss: Secondary | ICD-10-CM | POA: Diagnosis not present

## 2022-04-18 DIAGNOSIS — D500 Iron deficiency anemia secondary to blood loss (chronic): Secondary | ICD-10-CM | POA: Diagnosis not present

## 2022-04-18 DIAGNOSIS — K5909 Other constipation: Secondary | ICD-10-CM | POA: Diagnosis not present

## 2022-05-10 ENCOUNTER — Encounter: Payer: Self-pay | Admitting: Oncology

## 2022-05-11 ENCOUNTER — Ambulatory Visit: Payer: Medicare HMO

## 2022-05-11 DIAGNOSIS — K317 Polyp of stomach and duodenum: Secondary | ICD-10-CM | POA: Diagnosis not present

## 2022-05-11 DIAGNOSIS — K259 Gastric ulcer, unspecified as acute or chronic, without hemorrhage or perforation: Secondary | ICD-10-CM | POA: Diagnosis not present

## 2022-05-11 DIAGNOSIS — K5730 Diverticulosis of large intestine without perforation or abscess without bleeding: Secondary | ICD-10-CM | POA: Diagnosis not present

## 2022-05-11 DIAGNOSIS — D500 Iron deficiency anemia secondary to blood loss (chronic): Secondary | ICD-10-CM | POA: Diagnosis not present

## 2022-05-11 DIAGNOSIS — D122 Benign neoplasm of ascending colon: Secondary | ICD-10-CM | POA: Diagnosis not present

## 2022-05-11 DIAGNOSIS — D124 Benign neoplasm of descending colon: Secondary | ICD-10-CM | POA: Diagnosis not present

## 2022-05-11 DIAGNOSIS — R634 Abnormal weight loss: Secondary | ICD-10-CM | POA: Diagnosis not present

## 2022-05-11 DIAGNOSIS — D509 Iron deficiency anemia, unspecified: Secondary | ICD-10-CM | POA: Diagnosis not present

## 2022-05-11 DIAGNOSIS — K640 First degree hemorrhoids: Secondary | ICD-10-CM | POA: Diagnosis not present

## 2022-05-16 ENCOUNTER — Inpatient Hospital Stay: Payer: Medicare HMO | Attending: Oncology

## 2022-05-16 DIAGNOSIS — D508 Other iron deficiency anemias: Secondary | ICD-10-CM | POA: Diagnosis not present

## 2022-05-16 LAB — CBC WITH DIFFERENTIAL/PLATELET
Abs Immature Granulocytes: 0.03 10*3/uL (ref 0.00–0.07)
Basophils Absolute: 0 10*3/uL (ref 0.0–0.1)
Basophils Relative: 1 %
Eosinophils Absolute: 0.4 10*3/uL (ref 0.0–0.5)
Eosinophils Relative: 6 %
HCT: 41.8 % (ref 36.0–46.0)
Hemoglobin: 13.9 g/dL (ref 12.0–15.0)
Immature Granulocytes: 1 %
Lymphocytes Relative: 27 %
Lymphs Abs: 1.7 10*3/uL (ref 0.7–4.0)
MCH: 31 pg (ref 26.0–34.0)
MCHC: 33.3 g/dL (ref 30.0–36.0)
MCV: 93.3 fL (ref 80.0–100.0)
Monocytes Absolute: 0.7 10*3/uL (ref 0.1–1.0)
Monocytes Relative: 11 %
Neutro Abs: 3.4 10*3/uL (ref 1.7–7.7)
Neutrophils Relative %: 54 %
Platelets: 227 10*3/uL (ref 150–400)
RBC: 4.48 MIL/uL (ref 3.87–5.11)
RDW: 13 % (ref 11.5–15.5)
WBC: 6.3 10*3/uL (ref 4.0–10.5)
nRBC: 0 % (ref 0.0–0.2)

## 2022-05-16 LAB — IRON AND TIBC
Iron: 126 ug/dL (ref 28–170)
Saturation Ratios: 36 % — ABNORMAL HIGH (ref 10.4–31.8)
TIBC: 353 ug/dL (ref 250–450)
UIBC: 227 ug/dL

## 2022-05-16 LAB — FERRITIN: Ferritin: 105 ng/mL (ref 11–307)

## 2022-06-12 DIAGNOSIS — I493 Ventricular premature depolarization: Secondary | ICD-10-CM | POA: Diagnosis not present

## 2022-06-12 DIAGNOSIS — I480 Paroxysmal atrial fibrillation: Secondary | ICD-10-CM | POA: Diagnosis not present

## 2022-06-12 DIAGNOSIS — R002 Palpitations: Secondary | ICD-10-CM | POA: Diagnosis not present

## 2022-06-12 DIAGNOSIS — E782 Mixed hyperlipidemia: Secondary | ICD-10-CM | POA: Diagnosis not present

## 2022-06-12 DIAGNOSIS — I10 Essential (primary) hypertension: Secondary | ICD-10-CM | POA: Diagnosis not present

## 2022-06-12 DIAGNOSIS — I341 Nonrheumatic mitral (valve) prolapse: Secondary | ICD-10-CM | POA: Diagnosis not present

## 2022-06-12 DIAGNOSIS — I25119 Atherosclerotic heart disease of native coronary artery with unspecified angina pectoris: Secondary | ICD-10-CM | POA: Diagnosis not present

## 2022-06-12 DIAGNOSIS — I700 Atherosclerosis of aorta: Secondary | ICD-10-CM | POA: Diagnosis not present

## 2022-06-20 DIAGNOSIS — E782 Mixed hyperlipidemia: Secondary | ICD-10-CM | POA: Diagnosis not present

## 2022-06-20 DIAGNOSIS — M810 Age-related osteoporosis without current pathological fracture: Secondary | ICD-10-CM | POA: Diagnosis not present

## 2022-06-20 DIAGNOSIS — E538 Deficiency of other specified B group vitamins: Secondary | ICD-10-CM | POA: Diagnosis not present

## 2022-06-27 DIAGNOSIS — E538 Deficiency of other specified B group vitamins: Secondary | ICD-10-CM | POA: Diagnosis not present

## 2022-06-27 DIAGNOSIS — I480 Paroxysmal atrial fibrillation: Secondary | ICD-10-CM | POA: Diagnosis not present

## 2022-06-27 DIAGNOSIS — Z0000 Encounter for general adult medical examination without abnormal findings: Secondary | ICD-10-CM | POA: Diagnosis not present

## 2022-06-27 DIAGNOSIS — I700 Atherosclerosis of aorta: Secondary | ICD-10-CM | POA: Diagnosis not present

## 2022-06-27 DIAGNOSIS — Z1331 Encounter for screening for depression: Secondary | ICD-10-CM | POA: Diagnosis not present

## 2022-06-27 DIAGNOSIS — F32A Depression, unspecified: Secondary | ICD-10-CM | POA: Diagnosis not present

## 2022-06-27 DIAGNOSIS — I2510 Atherosclerotic heart disease of native coronary artery without angina pectoris: Secondary | ICD-10-CM | POA: Diagnosis not present

## 2022-07-04 DIAGNOSIS — H353112 Nonexudative age-related macular degeneration, right eye, intermediate dry stage: Secondary | ICD-10-CM | POA: Diagnosis not present

## 2022-07-04 DIAGNOSIS — H43392 Other vitreous opacities, left eye: Secondary | ICD-10-CM | POA: Diagnosis not present

## 2022-07-04 DIAGNOSIS — H353124 Nonexudative age-related macular degeneration, left eye, advanced atrophic with subfoveal involvement: Secondary | ICD-10-CM | POA: Diagnosis not present

## 2022-07-04 DIAGNOSIS — H43813 Vitreous degeneration, bilateral: Secondary | ICD-10-CM | POA: Diagnosis not present

## 2022-07-04 DIAGNOSIS — H26491 Other secondary cataract, right eye: Secondary | ICD-10-CM | POA: Diagnosis not present

## 2022-07-18 DIAGNOSIS — H353124 Nonexudative age-related macular degeneration, left eye, advanced atrophic with subfoveal involvement: Secondary | ICD-10-CM | POA: Diagnosis not present

## 2022-08-14 ENCOUNTER — Inpatient Hospital Stay: Payer: Medicare HMO | Admitting: Oncology

## 2022-08-14 ENCOUNTER — Inpatient Hospital Stay: Payer: Medicare HMO

## 2022-08-15 ENCOUNTER — Inpatient Hospital Stay: Payer: Medicare HMO

## 2022-08-15 ENCOUNTER — Inpatient Hospital Stay: Payer: Medicare HMO | Attending: Oncology | Admitting: Oncology

## 2022-08-15 ENCOUNTER — Encounter: Payer: Self-pay | Admitting: Oncology

## 2022-08-15 VITALS — BP 116/61 | HR 70 | Temp 96.5°F | Resp 18 | Wt 114.7 lb

## 2022-08-15 DIAGNOSIS — Z8673 Personal history of transient ischemic attack (TIA), and cerebral infarction without residual deficits: Secondary | ICD-10-CM | POA: Insufficient documentation

## 2022-08-15 DIAGNOSIS — C50312 Malignant neoplasm of lower-inner quadrant of left female breast: Secondary | ICD-10-CM

## 2022-08-15 DIAGNOSIS — I341 Nonrheumatic mitral (valve) prolapse: Secondary | ICD-10-CM | POA: Insufficient documentation

## 2022-08-15 DIAGNOSIS — K219 Gastro-esophageal reflux disease without esophagitis: Secondary | ICD-10-CM | POA: Diagnosis not present

## 2022-08-15 DIAGNOSIS — Z8719 Personal history of other diseases of the digestive system: Secondary | ICD-10-CM | POA: Insufficient documentation

## 2022-08-15 DIAGNOSIS — Z853 Personal history of malignant neoplasm of breast: Secondary | ICD-10-CM | POA: Diagnosis not present

## 2022-08-15 DIAGNOSIS — Z79899 Other long term (current) drug therapy: Secondary | ICD-10-CM | POA: Diagnosis not present

## 2022-08-15 DIAGNOSIS — D509 Iron deficiency anemia, unspecified: Secondary | ICD-10-CM | POA: Diagnosis not present

## 2022-08-15 DIAGNOSIS — D508 Other iron deficiency anemias: Secondary | ICD-10-CM

## 2022-08-15 DIAGNOSIS — K589 Irritable bowel syndrome without diarrhea: Secondary | ICD-10-CM | POA: Diagnosis not present

## 2022-08-15 DIAGNOSIS — R519 Headache, unspecified: Secondary | ICD-10-CM | POA: Diagnosis not present

## 2022-08-15 DIAGNOSIS — J45909 Unspecified asthma, uncomplicated: Secondary | ICD-10-CM | POA: Diagnosis not present

## 2022-08-15 DIAGNOSIS — Z87891 Personal history of nicotine dependence: Secondary | ICD-10-CM | POA: Diagnosis not present

## 2022-08-15 DIAGNOSIS — Z08 Encounter for follow-up examination after completed treatment for malignant neoplasm: Secondary | ICD-10-CM

## 2022-08-15 DIAGNOSIS — Z7982 Long term (current) use of aspirin: Secondary | ICD-10-CM | POA: Diagnosis not present

## 2022-08-15 DIAGNOSIS — Z800 Family history of malignant neoplasm of digestive organs: Secondary | ICD-10-CM | POA: Diagnosis not present

## 2022-08-15 DIAGNOSIS — E7800 Pure hypercholesterolemia, unspecified: Secondary | ICD-10-CM | POA: Diagnosis not present

## 2022-08-15 DIAGNOSIS — I10 Essential (primary) hypertension: Secondary | ICD-10-CM | POA: Insufficient documentation

## 2022-08-15 DIAGNOSIS — Z923 Personal history of irradiation: Secondary | ICD-10-CM | POA: Insufficient documentation

## 2022-08-15 LAB — CBC WITH DIFFERENTIAL/PLATELET
Abs Immature Granulocytes: 0.02 10*3/uL (ref 0.00–0.07)
Basophils Absolute: 0 10*3/uL (ref 0.0–0.1)
Basophils Relative: 0 %
Eosinophils Absolute: 0.4 10*3/uL (ref 0.0–0.5)
Eosinophils Relative: 6 %
HCT: 43.3 % (ref 36.0–46.0)
Hemoglobin: 14.5 g/dL (ref 12.0–15.0)
Immature Granulocytes: 0 %
Lymphocytes Relative: 26 %
Lymphs Abs: 1.8 10*3/uL (ref 0.7–4.0)
MCH: 30.8 pg (ref 26.0–34.0)
MCHC: 33.5 g/dL (ref 30.0–36.0)
MCV: 91.9 fL (ref 80.0–100.0)
Monocytes Absolute: 0.7 10*3/uL (ref 0.1–1.0)
Monocytes Relative: 10 %
Neutro Abs: 4 10*3/uL (ref 1.7–7.7)
Neutrophils Relative %: 58 %
Platelets: 223 10*3/uL (ref 150–400)
RBC: 4.71 MIL/uL (ref 3.87–5.11)
RDW: 12 % (ref 11.5–15.5)
WBC: 6.9 10*3/uL (ref 4.0–10.5)
nRBC: 0 % (ref 0.0–0.2)

## 2022-08-15 LAB — FERRITIN: Ferritin: 75 ng/mL (ref 11–307)

## 2022-08-15 LAB — IRON AND TIBC
Iron: 118 ug/dL (ref 28–170)
Saturation Ratios: 35 % — ABNORMAL HIGH (ref 10.4–31.8)
TIBC: 340 ug/dL (ref 250–450)
UIBC: 222 ug/dL

## 2022-08-16 NOTE — Progress Notes (Signed)
Hematology/Oncology Consult note [facility]  [phone] [phone]  Patient Care Team: Danella Penton, MD as PCP - General (Internal Medicine) Creig Hines, MD as Consulting Physician (Oncology) Carolan Shiver, MD as Consulting Physician (General Surgery) Jim Like, RN as Oncology Nurse Navigator Carmina Miller, MD as Referring Physician (Radiation Oncology)   Name of the patient: Doris Barnes  [id number]  1940-08-22   Date of visit: 08/16/22  Diagnosis- Stage IA invasive mammary carcinoma of the left breast ER negative, PR weakly positive and HER-2 positive  History of iron deficiency anemia  Chief complaint/ Reason for visit-routine follow-up of breast cancer and iron deficiency anemia  Heme/Onc history: Patient is a 82 year old female who was diagnosed with invasive mammary carcinoma of the left breast ER negative, PR 11 to 50% positive and HER-2/neu positive.  Tumor was grade 314 mm.  Lymph nodes were negative for malignancy.  She started adjuvant Taxol Herceptin chemotherapy in October 2018 and has completed 1 year of adjuvant Herceptin.  She is also completed adjuvant radiation.  She could not tolerate AI and did not wish to continue hormone therapy.    Patient referred for iron deficiency anemia in October 2023 and received IV iron for the same.  Interval history-patient is doing well for her age.  Headaches are presently well-controlled.  Denies any blood loss in her stool or urine  ECOG PS- 1 Pain scale- 0   Review of systems- Review of Systems  Constitutional:  Negative for chills, fever, malaise/fatigue and weight loss.  HENT:  Negative for congestion, ear discharge and nosebleeds.   Eyes:  Negative for blurred vision.  Respiratory:  Negative for cough, hemoptysis, sputum production, shortness of breath and wheezing.   Cardiovascular:  Negative for chest pain, palpitations, orthopnea and claudication.   Gastrointestinal:  Negative for abdominal pain, blood in stool, constipation, diarrhea, heartburn, melena, nausea and vomiting.  Genitourinary:  Negative for dysuria, flank pain, frequency, hematuria and urgency.  Musculoskeletal:  Negative for back pain, joint pain and myalgias.  Skin:  Negative for rash.  Neurological:  Negative for dizziness, tingling, focal weakness, seizures, weakness and headaches.  Endo/Heme/Allergies:  Does not bruise/bleed easily.  Psychiatric/Behavioral:  Negative for depression and suicidal ideas. The patient does not have insomnia.       Allergies  Allergen Reactions   Imipramine Pamoate Shortness Of Breath   Bisphosphonates Nausea Only   Amoxicillin-Pot Clavulanate Rash   Epinephrine Hcl (Nasal) Other (See Comments)    Difficulty breathing   Prednisone Palpitations   Sulfa Antibiotics Rash    Face turns red and stings     Past Medical History:  Diagnosis Date   Anemia    Anxiety    Asthma    Breast cancer (HCC) 12/14/2016   left breast   Cancer (HCC) skin   Chronic vulvitis    Colon polyp    Cystocele    Diverticulitis    Diverticulitis    Diverticulosis    Diverticulosis    Dyspareunia, female    Dyspnea    Dysrhythmia    Fibrocystic disease of both breasts    Gastritis    GERD (gastroesophageal reflux disease)    gastritis also   Hemorrhoids    History of hiatal hernia    Hypercholesteremia    Hypertension    IBS (irritable bowel syndrome)    IC (interstitial cystitis)    Migraine    Mitral valve prolapse    OP (osteoporosis)  Personal history of chemotherapy    Personal history of radiation therapy    Positional vertigo    Rectocele    Squamous cell carcinoma    Stroke [facility])    TIA   Vaginal atrophy      Past Surgical History:  Procedure Laterality Date   APPENDECTOMY     BREAST BIOPSY Left 1998   core bx- neg   BREAST BIOPSY Left 2007   neg   BREAST BIOPSY Left 12/14/2016   left breast US core positive    BREAST EXCISIONAL BIOPSY Left    BREAST LUMPECTOMY Left 01/01/2017    INVASIVE MAMMARY CARCINOMA/Grade 3   CARDIAC CATHETERIZATION N/A 01/18/2015   Procedure: Left Heart Cath and Coronary Angiography;  Surgeon: Dalia Heading, MD;  Location: [facility] INVASIVE CV LAB;  Service: Cardiovascular;  Laterality: N/A;   CATARACT EXTRACTION W/PHACO Right 12/15/2015   Procedure: CATARACT EXTRACTION PHACO AND INTRAOCULAR LENS PLACEMENT (IOC);  Surgeon: Sallee Lange, MD;  Location: [facility] ORS;  Service: Ophthalmology;  Laterality: Right;  [place] 01:20 AP% 23.9 CDE 35.49 Fluid pack lot # [id number]   CATARACT EXTRACTION W/PHACO Left 12/29/2015   Procedure: CATARACT EXTRACTION PHACO AND INTRAOCULAR LENS PLACEMENT (IOC);  Surgeon: Sallee Lange, MD;  Location: [facility] ORS;  Service: Ophthalmology;  Laterality: Left;  [place]  01:20 AP% 25.1 CDE 32.86 Fluid pack lot # [id number]   COLON SURGERY     COLONOSCOPY WITH PROPOFOL N/A 02/21/2016   Procedure: COLONOSCOPY WITH PROPOFOL;  Surgeon: Scot Jun, MD;  Location: [facility] ENDOSCOPY;  Service: Endoscopy;  Laterality: N/A;   COLONOSCOPY WITH PROPOFOL N/A 01/03/2019   Procedure: COLONOSCOPY WITH PROPOFOL;  Surgeon: Christena Deem, MD;  Location: [facility] ENDOSCOPY;  Service: Endoscopy;  Laterality: N/A;   CORONARY ANGIOPLASTY     DILATION AND CURETTAGE OF UTERUS     ESOPHAGOGASTRODUODENOSCOPY (EGD) WITH PROPOFOL N/A 11/23/2017   Procedure: ESOPHAGOGASTRODUODENOSCOPY (EGD) WITH PROPOFOL;  Surgeon: Scot Jun, MD;  Location: [facility] ENDOSCOPY;  Service: Endoscopy;  Laterality: N/A;   OOPHORECTOMY Bilateral    PARTIAL MASTECTOMY WITH NEEDLE LOCALIZATION Left 01/01/2017   Procedure: PARTIAL MASTECTOMY WITH NEEDLE LOCALIZATION;  Surgeon: Carolan Shiver, MD;  Location: [facility] ORS;  Service: General;  Laterality: Left;   PORTACATH PLACEMENT Right 01/01/2017   Procedure: INSERTION PORT-A-CATH;  Surgeon: Carolan Shiver, MD;  Location: [facility] ORS;  Service: General;   Laterality: Right;   SENTINEL NODE BIOPSY Left 01/01/2017   Procedure: SENTINEL NODE BIOPSY;  Surgeon: Carolan Shiver, MD;  Location: [facility] ORS;  Service: General;  Laterality: Left;   TONSILLECTOMY     VAGINAL HYSTERECTOMY      Social History   Socioeconomic History   Marital status: Married    Spouse name: Not on file   Number of children: Not on file   Years of education: Not on file   Highest education level: Not on file  Occupational History   Not on file  Tobacco Use   Smoking status: Former    Packs/day: 1.00    Years: 33.00    Additional pack years: 0.00    Total pack years: 33.00    Types: Cigarettes    Quit date: 10/17/1987    Years since quitting: 34.8   Smokeless tobacco: Never  Vaping Use   Vaping Use: Never used  Substance and Sexual Activity   Alcohol use: No   Drug use: No   Sexual activity: Yes  Other Topics Concern   Not on file  Social History Narrative  Not on file   Social Determinants of Health   Financial Resource Strain: Not on file  Food Insecurity: Not on file  Transportation Needs: Not on file  Physical Activity: Not on file  Stress: Not on file  Social Connections: Not on file  Intimate Partner Violence: Not on file    Family History  Problem Relation Age of Onset   Diabetes Mother    Diabetes Father    Colon cancer Sister    Diabetes Sister    Diabetes Maternal Aunt    Colon cancer Paternal Grandfather    Lung cancer Brother    Breast cancer Neg Hx    Ovarian cancer Neg Hx      Current Outpatient Medications:    albuterol (VENTOLIN HFA) 108 (90 Base) MCG/ACT inhaler, Inhale into the lungs., Disp: , Rfl:    ALPRAZolam (XANAX) 0.25 MG tablet, Take 0.25 mg by mouth at bedtime as needed for anxiety., Disp: , Rfl:    atenolol (TENORMIN) 50 MG tablet, , Disp: , Rfl:    benzonatate (TESSALON) 200 MG capsule, , Disp: , Rfl:    Calcium Carbonate-Vitamin D 600-400 MG-UNIT tablet, Take 1 tablet by mouth 2 (two) times daily.,  Disp: , Rfl:    dibucaine (NUPERCAINAL) 1 % OINT, Place 1 application rectally as needed for hemorrhoids., Disp: 28 g, Rfl: 0   famotidine (PEPCID) 40 MG tablet, Take 40 mg by mouth at bedtime., Disp: , Rfl:    FLUoxetine (PROZAC) 40 MG capsule, Take 40 mg by mouth daily., Disp: , Rfl:    furosemide (LASIX) 20 MG tablet, Take 20 mg by mouth daily., Disp: , Rfl:    LAGEVRIO 200 MG CAPS capsule, Take 4 capsules by mouth 2 (two) times daily., Disp: , Rfl:    lansoprazole (PREVACID) 30 MG capsule, Take 30 mg by mouth daily before breakfast. , Disp: , Rfl:    Magnesium 250 MG TABS, Take 250 mg by mouth daily., Disp: , Rfl:    mirtazapine (REMERON) 7.5 MG tablet, Take 7.5 mg by mouth at bedtime., Disp: , Rfl:    Multiple Vitamins-Minerals (PRESERVISION AREDS 2) CAPS, Take 1 capsule by mouth 2 (two) times daily., Disp: , Rfl:    Probiotic Product (ALIGN PO), Take by mouth., Disp: , Rfl:    propranolol (INDERAL) 40 MG tablet, Take 40 mg by mouth 2 (two) times daily., Disp: , Rfl:    simvastatin (ZOCOR) 20 MG tablet, Take 20 mg by mouth daily at 8 pm., Disp: , Rfl:    spironolactone (ALDACTONE) 25 MG tablet, Take 25 mg by mouth daily., Disp: , Rfl:    acetaminophen (TYLENOL) 325 MG tablet, Take 650 mg by mouth every 6 (six) hours as needed for headache. (Patient not taking: Reported on 07/29/2021), Disp: , Rfl:    acidophilus (RISAQUAD) CAPS capsule, Take 2 capsules by mouth daily. (Patient not taking: Reported on 02/13/2022), Disp: , Rfl:    aspirin EC 81 MG tablet, Take 81 mg by mouth daily. Swallow whole. (Patient not taking: Reported on 02/13/2022), Disp: , Rfl:    gabapentin (NEURONTIN) 100 MG capsule, Take 300 mg by mouth at bedtime. (Patient not taking: Reported on 07/29/2021), Disp: , Rfl:    polyethylene glycol (MIRALAX / GLYCOLAX) packet, Take 17 g by mouth daily as needed for moderate constipation. (Patient not taking: Reported on 12/30/2021), Disp: , Rfl:    telmisartan (MICARDIS) 80 MG tablet, Take  40 mg by mouth daily with breakfast. (Patient not taking: Reported on  02/13/2022), Disp: , Rfl:    traZODone (DESYREL) 50 MG tablet, Take 50 mg by mouth at bedtime. (Patient not taking: Reported on 02/13/2022), Disp: , Rfl:   Physical exam:  Vitals:   08/15/22 1448  BP: 116/61  Pulse: 70  Resp: 18  Temp: (!) 96.5 F (35.8 C)  TempSrc: Tympanic  SpO2: 94%  Weight: 114 lb 11.2 oz (52 kg)   Physical Exam Cardiovascular:     Rate and Rhythm: Normal rate and regular rhythm.     Heart sounds: Normal heart sounds.  Pulmonary:     Effort: Pulmonary effort is normal.     Breath sounds: Normal breath sounds.  Abdominal:     General: Bowel sounds are normal.     Palpations: Abdomen is soft.  Skin:    General: Skin is warm and dry.  Neurological:     Mental Status: She is alert and oriented to person, place, and time.    Breast exam was performed in seated and lying down position. Patient is status post left lumpectomy with a well-healed surgical scar. No evidence of any palpable masses. No evidence of axillary adenopathy. No evidence of any palpable masses or lumps in the right breast. No evidence of right axillary adenopathy      Latest Ref Rng & Units 07/29/2021   10:40 AM  CMP  Glucose 70 - 99 mg/dL 119   BUN 8 - 23 mg/dL 14   Creatinine 1.47 - 1.00 mg/dL 8.29   Sodium 562 - 130 mmol/L 134   Potassium 3.5 - 5.1 mmol/L 4.2   Chloride 98 - 111 mmol/L 99   CO2 22 - 32 mmol/L 29   Calcium 8.9 - 10.3 mg/dL 9.0   Total Protein 6.5 - 8.1 g/dL 6.9   Total Bilirubin 0.3 - 1.2 mg/dL 0.8   Alkaline Phos 38 - 126 U/L 77   AST 15 - 41 U/L 18   ALT 0 - 44 U/L 12       Latest Ref Rng & Units 08/15/2022    2:30 PM  CBC  WBC 4.0 - 10.5 K/uL 6.9   Hemoglobin 12.0 - 15.0 g/dL 86.5   Hematocrit 78.4 - 46.0 % 43.3   Platelets 150 - 400 K/uL 223      Assessment and plan- Patient is a 82 y.o. female who is here for follow-up of following issues  Surveillance breast cancer: Patient is  now more than 5 years out of her breast cancer diagnosis.  Clinically she is doing well with no concerning signs and symptoms of recurrence based on today's exam.  I will continue to follow-up on a yearly basis  Iron deficiency anemia: Patient is not presently anemic and her iron studies are normal.  Given the stability of her counts I will repeat levels in 1 year and see her thereafter   Visit Diagnosis 1. Encounter for follow-up surveillance of breast cancer   2. Other iron deficiency anemia   3. Iron deficiency anemia, unspecified iron deficiency anemia type      Dr. Owens Shark, MD, MPH [facility] [phone] 08/16/2022 9:00 AM

## 2022-08-21 ENCOUNTER — Encounter: Payer: Self-pay | Admitting: Oncology

## 2022-09-12 DIAGNOSIS — H353123 Nonexudative age-related macular degeneration, left eye, advanced atrophic without subfoveal involvement: Secondary | ICD-10-CM | POA: Diagnosis not present

## 2022-09-12 DIAGNOSIS — H43813 Vitreous degeneration, bilateral: Secondary | ICD-10-CM | POA: Diagnosis not present

## 2022-09-12 DIAGNOSIS — H26491 Other secondary cataract, right eye: Secondary | ICD-10-CM | POA: Diagnosis not present

## 2022-09-12 DIAGNOSIS — H43392 Other vitreous opacities, left eye: Secondary | ICD-10-CM | POA: Diagnosis not present

## 2022-09-12 DIAGNOSIS — H353112 Nonexudative age-related macular degeneration, right eye, intermediate dry stage: Secondary | ICD-10-CM | POA: Diagnosis not present

## 2022-09-26 DIAGNOSIS — R1012 Left upper quadrant pain: Secondary | ICD-10-CM | POA: Diagnosis not present

## 2022-09-26 DIAGNOSIS — Z86010 Personal history of colonic polyps: Secondary | ICD-10-CM | POA: Diagnosis not present

## 2022-09-26 DIAGNOSIS — K5909 Other constipation: Secondary | ICD-10-CM | POA: Diagnosis not present

## 2022-09-26 DIAGNOSIS — K259 Gastric ulcer, unspecified as acute or chronic, without hemorrhage or perforation: Secondary | ICD-10-CM | POA: Diagnosis not present

## 2022-09-26 DIAGNOSIS — R1011 Right upper quadrant pain: Secondary | ICD-10-CM | POA: Diagnosis not present

## 2022-09-26 DIAGNOSIS — D500 Iron deficiency anemia secondary to blood loss (chronic): Secondary | ICD-10-CM | POA: Diagnosis not present

## 2022-09-27 DIAGNOSIS — H353131 Nonexudative age-related macular degeneration, bilateral, early dry stage: Secondary | ICD-10-CM | POA: Diagnosis not present

## 2022-10-30 DIAGNOSIS — H353131 Nonexudative age-related macular degeneration, bilateral, early dry stage: Secondary | ICD-10-CM | POA: Diagnosis not present

## 2022-10-30 DIAGNOSIS — Z0100 Encounter for examination of eyes and vision without abnormal findings: Secondary | ICD-10-CM | POA: Diagnosis not present

## 2022-11-02 ENCOUNTER — Ambulatory Visit: Payer: Medicare HMO

## 2022-11-02 DIAGNOSIS — K259 Gastric ulcer, unspecified as acute or chronic, without hemorrhage or perforation: Secondary | ICD-10-CM | POA: Diagnosis not present

## 2022-11-02 DIAGNOSIS — R1012 Left upper quadrant pain: Secondary | ICD-10-CM | POA: Diagnosis not present

## 2022-11-02 DIAGNOSIS — K3189 Other diseases of stomach and duodenum: Secondary | ICD-10-CM | POA: Diagnosis not present

## 2022-11-02 DIAGNOSIS — R1011 Right upper quadrant pain: Secondary | ICD-10-CM | POA: Diagnosis not present

## 2022-11-02 DIAGNOSIS — D500 Iron deficiency anemia secondary to blood loss (chronic): Secondary | ICD-10-CM | POA: Diagnosis not present

## 2022-11-10 DIAGNOSIS — Z961 Presence of intraocular lens: Secondary | ICD-10-CM | POA: Diagnosis not present

## 2022-11-10 DIAGNOSIS — H353124 Nonexudative age-related macular degeneration, left eye, advanced atrophic with subfoveal involvement: Secondary | ICD-10-CM | POA: Diagnosis not present

## 2022-11-10 DIAGNOSIS — H353112 Nonexudative age-related macular degeneration, right eye, intermediate dry stage: Secondary | ICD-10-CM | POA: Diagnosis not present

## 2022-12-07 ENCOUNTER — Other Ambulatory Visit: Payer: Self-pay

## 2022-12-07 ENCOUNTER — Emergency Department: Payer: Medicare HMO

## 2022-12-07 ENCOUNTER — Inpatient Hospital Stay
Admission: EM | Admit: 2022-12-07 | Discharge: 2022-12-09 | DRG: 392 | Disposition: A | Discharge status: Home / Self Care | Payer: Medicare HMO | Attending: Hospitalist | Admitting: Hospitalist

## 2022-12-07 ENCOUNTER — Encounter: Payer: Self-pay | Admitting: Emergency Medicine

## 2022-12-07 DIAGNOSIS — Z9842 Cataract extraction status, left eye: Secondary | ICD-10-CM

## 2022-12-07 DIAGNOSIS — Z9841 Cataract extraction status, right eye: Secondary | ICD-10-CM | POA: Diagnosis not present

## 2022-12-07 DIAGNOSIS — Z923 Personal history of irradiation: Secondary | ICD-10-CM

## 2022-12-07 DIAGNOSIS — Z90722 Acquired absence of ovaries, bilateral: Secondary | ICD-10-CM

## 2022-12-07 DIAGNOSIS — I480 Paroxysmal atrial fibrillation: Secondary | ICD-10-CM | POA: Diagnosis present

## 2022-12-07 DIAGNOSIS — E7800 Pure hypercholesterolemia, unspecified: Secondary | ICD-10-CM | POA: Diagnosis not present

## 2022-12-07 DIAGNOSIS — F419 Anxiety disorder, unspecified: Secondary | ICD-10-CM | POA: Diagnosis present

## 2022-12-07 DIAGNOSIS — G459 Transient cerebral ischemic attack, unspecified: Secondary | ICD-10-CM | POA: Diagnosis present

## 2022-12-07 DIAGNOSIS — Z853 Personal history of malignant neoplasm of breast: Secondary | ICD-10-CM | POA: Diagnosis not present

## 2022-12-07 DIAGNOSIS — Z881 Allergy status to other antibiotic agents status: Secondary | ICD-10-CM | POA: Diagnosis not present

## 2022-12-07 DIAGNOSIS — K259 Gastric ulcer, unspecified as acute or chronic, without hemorrhage or perforation: Secondary | ICD-10-CM

## 2022-12-07 DIAGNOSIS — Z8673 Personal history of transient ischemic attack (TIA), and cerebral infarction without residual deficits: Secondary | ICD-10-CM | POA: Diagnosis not present

## 2022-12-07 DIAGNOSIS — I10 Essential (primary) hypertension: Secondary | ICD-10-CM | POA: Diagnosis present

## 2022-12-07 DIAGNOSIS — Z9221 Personal history of antineoplastic chemotherapy: Secondary | ICD-10-CM

## 2022-12-07 DIAGNOSIS — M810 Age-related osteoporosis without current pathological fracture: Secondary | ICD-10-CM | POA: Diagnosis present

## 2022-12-07 DIAGNOSIS — Z90710 Acquired absence of both cervix and uterus: Secondary | ICD-10-CM

## 2022-12-07 DIAGNOSIS — K5720 Diverticulitis of large intestine with perforation and abscess without bleeding: Secondary | ICD-10-CM | POA: Diagnosis not present

## 2022-12-07 DIAGNOSIS — K5792 Diverticulitis of intestine, part unspecified, without perforation or abscess without bleeding: Secondary | ICD-10-CM | POA: Diagnosis not present

## 2022-12-07 DIAGNOSIS — Z833 Family history of diabetes mellitus: Secondary | ICD-10-CM

## 2022-12-07 DIAGNOSIS — K5732 Diverticulitis of large intestine without perforation or abscess without bleeding: Principal | ICD-10-CM | POA: Diagnosis present

## 2022-12-07 DIAGNOSIS — E785 Hyperlipidemia, unspecified: Secondary | ICD-10-CM | POA: Diagnosis present

## 2022-12-07 DIAGNOSIS — Z882 Allergy status to sulfonamides status: Secondary | ICD-10-CM

## 2022-12-07 DIAGNOSIS — R1084 Generalized abdominal pain: Principal | ICD-10-CM

## 2022-12-07 DIAGNOSIS — Z23 Encounter for immunization: Secondary | ICD-10-CM | POA: Diagnosis not present

## 2022-12-07 DIAGNOSIS — K2960 Other gastritis without bleeding: Secondary | ICD-10-CM | POA: Diagnosis present

## 2022-12-07 DIAGNOSIS — Z9012 Acquired absence of left breast and nipple: Secondary | ICD-10-CM | POA: Diagnosis not present

## 2022-12-07 DIAGNOSIS — I482 Chronic atrial fibrillation, unspecified: Secondary | ICD-10-CM

## 2022-12-07 DIAGNOSIS — Z888 Allergy status to other drugs, medicaments and biological substances status: Secondary | ICD-10-CM | POA: Diagnosis not present

## 2022-12-07 DIAGNOSIS — Z86010 Personal history of colonic polyps: Secondary | ICD-10-CM

## 2022-12-07 DIAGNOSIS — I4891 Unspecified atrial fibrillation: Secondary | ICD-10-CM | POA: Insufficient documentation

## 2022-12-07 DIAGNOSIS — I341 Nonrheumatic mitral (valve) prolapse: Secondary | ICD-10-CM | POA: Diagnosis not present

## 2022-12-07 DIAGNOSIS — Z880 Allergy status to penicillin: Secondary | ICD-10-CM | POA: Diagnosis not present

## 2022-12-07 DIAGNOSIS — Z8711 Personal history of peptic ulcer disease: Secondary | ICD-10-CM | POA: Diagnosis not present

## 2022-12-07 DIAGNOSIS — Z801 Family history of malignant neoplasm of trachea, bronchus and lung: Secondary | ICD-10-CM

## 2022-12-07 DIAGNOSIS — Z87891 Personal history of nicotine dependence: Secondary | ICD-10-CM | POA: Diagnosis not present

## 2022-12-07 DIAGNOSIS — Z961 Presence of intraocular lens: Secondary | ICD-10-CM | POA: Diagnosis present

## 2022-12-07 DIAGNOSIS — Z800 Family history of malignant neoplasm of digestive organs: Secondary | ICD-10-CM

## 2022-12-07 DIAGNOSIS — Z79899 Other long term (current) drug therapy: Secondary | ICD-10-CM

## 2022-12-07 DIAGNOSIS — Z9861 Coronary angioplasty status: Secondary | ICD-10-CM | POA: Diagnosis not present

## 2022-12-07 LAB — PROTIME-INR
INR: 1.1 (ref 0.8–1.2)
Prothrombin Time: 14 s (ref 11.4–15.2)

## 2022-12-07 LAB — COMPREHENSIVE METABOLIC PANEL
ALT: 18 U/L (ref 0–44)
AST: 21 U/L (ref 15–41)
Albumin: 4 g/dL (ref 3.5–5.0)
Alkaline Phosphatase: 83 U/L (ref 38–126)
Anion gap: 9 (ref 5–15)
BUN: 12 mg/dL (ref 8–23)
CO2: 27 mmol/L (ref 22–32)
Calcium: 9.2 mg/dL (ref 8.9–10.3)
Chloride: 101 mmol/L (ref 98–111)
Creatinine, Ser: 0.8 mg/dL (ref 0.44–1.00)
GFR, Estimated: >60 mL/min (ref >60)
Glucose, Bld: 137 mg/dL — ABNORMAL HIGH (ref 70–99)
Potassium: 4.1 mmol/L (ref 3.5–5.1)
Sodium: 137 mmol/L (ref 135–145)
Total Bilirubin: 0.9 mg/dL (ref 0.3–1.2)
Total Protein: 7.7 g/dL (ref 6.5–8.1)

## 2022-12-07 LAB — URINALYSIS, ROUTINE W REFLEX MICROSCOPIC
Bilirubin Urine: NEGATIVE
Glucose, UA: NEGATIVE mg/dL
Hgb urine dipstick: NEGATIVE
Ketones, ur: NEGATIVE mg/dL
Leukocytes,Ua: NEGATIVE
Nitrite: NEGATIVE
Protein, ur: NEGATIVE mg/dL
Specific Gravity, Urine: 1.01 (ref 1.005–1.030)
pH: 7 (ref 5.0–8.0)

## 2022-12-07 LAB — TYPE AND SCREEN
ABO/RH(D): O NEG
Antibody Screen: NEGATIVE

## 2022-12-07 LAB — CBC WITH DIFFERENTIAL/PLATELET
Abs Immature Granulocytes: 0.05 10*3/uL (ref 0.00–0.07)
Basophils Absolute: 0 10*3/uL (ref 0.0–0.1)
Basophils Relative: 0 %
Eosinophils Absolute: 0.3 10*3/uL (ref 0.0–0.5)
Eosinophils Relative: 2 %
HCT: 44.4 % (ref 36.0–46.0)
Hemoglobin: 14.9 g/dL (ref 12.0–15.0)
Immature Granulocytes: 0 %
Lymphocytes Relative: 10 %
Lymphs Abs: 1.5 10*3/uL (ref 0.7–4.0)
MCH: 30.5 pg (ref 26.0–34.0)
MCHC: 33.6 g/dL (ref 30.0–36.0)
MCV: 91 fL (ref 80.0–100.0)
Monocytes Absolute: 1.4 10*3/uL — ABNORMAL HIGH (ref 0.1–1.0)
Monocytes Relative: 9 %
Neutro Abs: 11.3 10*3/uL — ABNORMAL HIGH (ref 1.7–7.7)
Neutrophils Relative %: 79 %
Platelets: 267 10*3/uL (ref 150–400)
RBC: 4.88 MIL/uL (ref 3.87–5.11)
RDW: 12.3 % (ref 11.5–15.5)
WBC: 14.6 10*3/uL — ABNORMAL HIGH (ref 4.0–10.5)
nRBC: 0 % (ref 0.0–0.2)

## 2022-12-07 LAB — APTT: aPTT: 38 s — ABNORMAL HIGH (ref 24–36)

## 2022-12-07 LAB — LIPASE, BLOOD: Lipase: 35 U/L (ref 11–51)

## 2022-12-07 MED ORDER — HYDROMORPHONE HCL 1 MG/ML IJ SOLN
0.5000 mg | Freq: Once | INTRAMUSCULAR | Status: AC
  Administered 2022-12-07: 0.5 mg via INTRAVENOUS
  Filled 2022-12-07: qty 0.5

## 2022-12-07 MED ORDER — ONDANSETRON HCL 4 MG/2ML IJ SOLN
4.0000 mg | Freq: Once | INTRAMUSCULAR | Status: AC
  Administered 2022-12-07: 4 mg via INTRAVENOUS
  Filled 2022-12-07: qty 2

## 2022-12-07 MED ORDER — PIPERACILLIN-TAZOBACTAM 3.375 G IVPB
3.3750 g | Freq: Three times a day (TID) | INTRAVENOUS | Status: DC
  Administered 2022-12-07 – 2022-12-09 (×6): 3.375 g via INTRAVENOUS
  Filled 2022-12-07 (×6): qty 50

## 2022-12-07 MED ORDER — ACETAMINOPHEN 325 MG PO TABS
650.0000 mg | ORAL_TABLET | Freq: Four times a day (QID) | ORAL | Status: DC | PRN
  Administered 2022-12-07 – 2022-12-08 (×2): 650 mg via ORAL
  Filled 2022-12-07 (×2): qty 2

## 2022-12-07 MED ORDER — SODIUM CHLORIDE 0.9 % IV SOLN: INTRAVENOUS | Status: DC

## 2022-12-07 MED ORDER — ONDANSETRON HCL 4 MG/2ML IJ SOLN
4.0000 mg | Freq: Four times a day (QID) | INTRAMUSCULAR | Status: DC | PRN
  Administered 2022-12-08: 4 mg via INTRAVENOUS
  Filled 2022-12-07: qty 2

## 2022-12-07 MED ORDER — INFLUENZA VAC A&B SURF ANT ADJ 0.5 ML IM SUSY
0.5000 mL | PREFILLED_SYRINGE | INTRAMUSCULAR | Status: AC
  Administered 2022-12-08: 0.5 mL via INTRAMUSCULAR
  Filled 2022-12-07: qty 0.5

## 2022-12-07 MED ORDER — PIPERACILLIN-TAZOBACTAM 3.375 G IVPB 30 MIN
3.3750 g | Freq: Once | INTRAVENOUS | Status: AC
  Administered 2022-12-07: 3.375 g via INTRAVENOUS
  Filled 2022-12-07: qty 50

## 2022-12-07 MED ORDER — HYDROMORPHONE HCL 1 MG/ML IJ SOLN
0.5000 mg | INTRAMUSCULAR | Status: DC | PRN
  Administered 2022-12-08: 0.5 mg via INTRAVENOUS
  Filled 2022-12-07: qty 0.5

## 2022-12-07 MED ORDER — LACTATED RINGERS IV BOLUS
1000.0000 mL | Freq: Once | INTRAVENOUS | Status: AC
  Administered 2022-12-07: 1000 mL via INTRAVENOUS

## 2022-12-07 MED ORDER — ONDANSETRON HCL 4 MG PO TABS: 4.0000 mg | ORAL_TABLET | Freq: Four times a day (QID) | ORAL | Status: DC | PRN

## 2022-12-07 MED ORDER — ENOXAPARIN SODIUM 40 MG/0.4ML IJ SOSY
40.0000 mg | PREFILLED_SYRINGE | INTRAMUSCULAR | Status: DC
  Administered 2022-12-07 – 2022-12-09 (×3): 40 mg via SUBCUTANEOUS
  Filled 2022-12-07 (×3): qty 0.4

## 2022-12-07 MED ORDER — PANTOPRAZOLE SODIUM 40 MG PO TBEC
40.0000 mg | DELAYED_RELEASE_TABLET | Freq: Two times a day (BID) | ORAL | Status: DC
  Administered 2022-12-07 – 2022-12-09 (×5): 40 mg via ORAL
  Filled 2022-12-07 (×5): qty 1

## 2022-12-07 MED ORDER — ALPRAZOLAM 0.25 MG PO TABS
0.2500 mg | ORAL_TABLET | Freq: Every evening | ORAL | Status: DC | PRN
  Administered 2022-12-07 – 2022-12-08 (×2): 0.25 mg via ORAL
  Filled 2022-12-07 (×2): qty 1

## 2022-12-07 MED ORDER — IOHEXOL 300 MG/ML  SOLN
75.0000 mL | Freq: Once | INTRAMUSCULAR | Status: AC | PRN
  Administered 2022-12-07: 75 mL via INTRAVENOUS

## 2022-12-07 NOTE — ED Provider Notes (Signed)
[facility] Provider Note    Event Date/Time   First MD Initiated Contact with Patient 12/07/22 0532     (approximate)   History   Abdominal Pain   HPI  Doris Barnes is a 82 y.o. female who presents to the ED for evaluation of Abdominal Pain   Review of GI clinic visit from 7/2.  History of PUD and iron deficiency anemia.  Most recent colonoscopy was this past February.  Polyps removed, diverticulosis of multiple sites of the colon.  Internal hemorrhoids.  Patient presents to the ED alongside her husband for evaluation of 2 to 3 days of progressively worsening lower abdominal pain.  2 bowel movements yesterday without diarrhea, hematochezia or melena.  Nausea without emesis.  No fevers.  No urinary symptoms.   Physical Exam   Triage Vital Signs: ED Triage Vitals [12/07/22 0528]  Encounter Vitals Group     BP      Systolic BP Percentile      Diastolic BP Percentile      Pulse      Resp      Temp      Temp src      SpO2      Weight 113 lb (51.3 kg)     Height 5\' 1"  (1.549 m)     Head Circumference      Peak Flow      Pain Score 7     Pain Loc      Pain Education      Exclude from Growth Chart     Most recent vital signs: Vitals:   12/07/22 0535 12/07/22 0630  BP: (!) 166/74 (!) 129/54  Pulse: 93 84  Resp: 18 19  Temp: 98.4 F (36.9 C)   SpO2: 95% 94%    General: Awake, no distress.  Seems uncomfortable CV:  Good peripheral perfusion.  Resp:  Normal effort.  Abd:  No distention.  Diffuse tenderness with guarding throughout MSK:  No deformity noted.  Neuro:  No focal deficits appreciated. Other:     ED Results / Procedures / Treatments   Labs (all labs ordered are listed, but only abnormal results are displayed) Labs Reviewed  COMPREHENSIVE METABOLIC PANEL - Abnormal; Notable for the following components:      Result Value   Glucose, Bld 137 (*)    All other components within normal limits  CBC WITH  DIFFERENTIAL/PLATELET - Abnormal; Notable for the following components:   WBC 14.6 (*)    Neutro Abs 11.3 (*)    Monocytes Absolute 1.4 (*)    All other components within normal limits  URINALYSIS, ROUTINE W REFLEX MICROSCOPIC - Abnormal; Notable for the following components:   Color, Urine YELLOW (*)    APPearance CLEAR (*)    All other components within normal limits  APTT - Abnormal; Notable for the following components:   aPTT 38 (*)    All other components within normal limits  LIPASE, BLOOD  PROTIME-INR  TYPE AND SCREEN    EKG   RADIOLOGY CT abdomen/pelvis interpreted by me with sigmoid diverticulitis  Official radiology report(s): CT ABDOMEN PELVIS W CONTRAST  Result Date: 12/07/2022 CLINICAL DATA:  Concerning, diffuse tenderness, question peritonitis. Evaluate diverticulitis EXAM: CT ABDOMEN AND PELVIS WITH CONTRAST TECHNIQUE: Multidetector CT imaging of the abdomen and pelvis was performed using the standard protocol following bolus administration of intravenous contrast. RADIATION DOSE REDUCTION: This exam was performed according to the departmental dose-optimization program which includes automated  exposure control, adjustment of the mA and/or kV according to patient size and/or use of iterative reconstruction technique. CONTRAST:  75mL OMNIPAQUE IOHEXOL 300 MG/ML  SOLN COMPARISON:  03/30/2021 FINDINGS: Lower chest: Small pulmonary nodules in the right lower lobe are unchanged at 5 mm laterally on the right as measured on 4:7 in foreign L medially on the right as measured on 4:10. Hepatobiliary: No focal liver abnormality.No evidence of biliary obstruction or stone. Pancreas: Unremarkable. Spleen: Unremarkable. Adrenals/Urinary Tract: Negative adrenals. No hydronephrosis or stone. Unremarkable bladder. Stomach/Bowel: Submucosal edema in the sigmoid colon associated with numerous colonic diverticula, 1 sizable diverticulum projecting leftward showing focal inflammation. A  diverticulum appears intact with no abscess or pneumoperitoneum. Vascular/Lymphatic: No acute vascular abnormality. Extensive atheromatous calcification of the aorta and branch vessels. No mass or adenopathy. Reproductive:Hysterectomy Other: No ascites or pneumoperitoneum. Musculoskeletal: No acute abnormalities. Generalized spinal degeneration. Chronic L2 superior endplate fracture IMPRESSION: Sigmoid diverticulitis. Intense inflammatory change but no abscess or pneumoperitoneum. Electronically Signed   By: Tiburcio Pea M.D.   On: 12/07/2022 06:32    PROCEDURES and INTERVENTIONS:  .1-3 Lead EKG Interpretation  Performed by: Delton Prairie, MD Authorized by: Delton Prairie, MD     Interpretation: normal     ECG rate:  90   ECG rate assessment: normal     Rhythm: sinus rhythm     Ectopy: none     Conduction: normal   .Critical Care  Performed by: Delton Prairie, MD Authorized by: Delton Prairie, MD   Critical care provider statement:    Critical care time (minutes):  30   Critical care time was exclusive of:  Separately billable procedures and treating other patients   Critical care was necessary to treat or prevent imminent or life-threatening deterioration of the following conditions:  Sepsis   Critical care was time spent personally by me on the following activities:  Development of treatment plan with patient or surrogate, discussions with consultants, evaluation of patient's response to treatment, examination of patient, ordering and review of laboratory studies, ordering and review of radiographic studies, ordering and performing treatments and interventions, pulse oximetry, re-evaluation of patient's condition and review of old charts   Medications  HYDROmorphone (DILAUDID) injection 0.5 mg (0.5 mg Intravenous Given 12/07/22 0602)  ondansetron (ZOFRAN) injection 4 mg (4 mg Intravenous Given 12/07/22 0601)  lactated ringers bolus 1,000 mL (1,000 mLs Intravenous New Bag/Given 12/07/22 0634)   piperacillin-tazobactam (ZOSYN) IVPB 3.375 g (0 g Intravenous Stopped 12/07/22 0634)  iohexol (OMNIPAQUE) 300 MG/ML solution 75 mL (75 mLs Intravenous Contrast Given 12/07/22 0614)     IMPRESSION / MDM / ASSESSMENT AND PLAN / ED COURSE  I reviewed the triage vital signs and the nursing notes.  Differential diagnosis includes, but is not limited to, diverticulitis, perforated viscus, appendicitis, SBO  {Patient presents with symptoms of an acute illness or injury that is potentially life-threatening.  Patient presents with evidence of diverticulitis requiring medical admission.  Leukocytosis is noted, but no metabolic derangements.  Normal lipase.  Clear urine.  CT without complicating features such as abscess or signs of free air, though her exam is fairly concerning with diffuse tenderness.  Zosyn provided.  Will consult with medicine for admission  Clinical Course as of 12/07/22 0640  Thu Dec 07, 2022  [id number] Reassessed and ensured nursing staff has placed an IV and sent blood, grabbing medications.  Reinforced my concern about this patient and her abdominal examination [DS]  [phone] Reassessed and discussed CT findings with the patient  and recommended medical admission.  She is agreeable. [DS]    Clinical Course User Index [DS] Delton Prairie, MD     FINAL CLINICAL IMPRESSION(S) / ED DIAGNOSES   Final diagnoses:  Generalized abdominal pain  Sigmoid diverticulitis     Rx / DC Orders   ED Discharge Orders     None        Note:  This document was prepared using Dragon voice recognition software and may include unintentional dictation errors.   Delton Prairie, MD 12/07/22 [phone]

## 2022-12-07 NOTE — Assessment & Plan Note (Addendum)
Noted remote history of atrial fibrillation follow-up in the [facility] system Looks to not be on anticoagulation in the setting of history of gastric bleeding ulcers  Rate controlled  EKG pending Will otherwise monitor for now

## 2022-12-07 NOTE — Assessment & Plan Note (Signed)
Worsening left lower quadrant abdominal pain over multiple days with noted diverticulitis on CT scan IV Zosyn Pain control Antiemetics IV fluids Monitor

## 2022-12-07 NOTE — ED Notes (Signed)
Informed RN bed assigned 

## 2022-12-07 NOTE — Assessment & Plan Note (Signed)
Remote history of TIA but no gross residual deficits noted Monitor

## 2022-12-07 NOTE — Consult Note (Signed)
Pharmacy Antibiotic Note  ASSESSMENT: 82 y.o. female with PMH including adaptive colitis, ischemic colitis, erosive gastritis, and breast cancer is presenting with  intra-abdominal infection . Patient endorses progressively worsening abdominal pain the past 2 to 3 days. She has been able to have bowel movements. She is VSS but does have leukocytosis. CTAP notable for sigmoid diverticulitis without abscess or pneumoperitoneum. Pharmacy has been consulted to manage Zosyn dosing and patient received her first dose this morning.  Patient has an allergy (rash) to Augmentin listed in her profile but she tolerated the first dose of Zosyn without incident.  Patient measurements: Height: 5\' 1"  (154.9 cm) Weight: 51.3 kg (113 lb) IBW/kg (Calculated) : 47.8  Vital signs: Temp: 98.4 F (36.9 C) (09/12 0535) Temp Source: Oral (09/12 0535) BP: 129/54 (09/12 0630) Pulse Rate: 84 (09/12 0630) Recent Labs  Lab 12/07/22 0545  WBC 14.6*  CREATININE 0.80   Estimated Creatinine Clearance: 40.9 mL/min (by C-G formula based on SCr of 0.8 mg/dL).  Allergies: Allergies  Allergen Reactions   Imipramine Pamoate Shortness Of Breath   Bisphosphonates Nausea Only   Amoxicillin-Pot Clavulanate Rash   Epinephrine Hcl (Nasal) Other (See Comments)    Difficulty breathing   Prednisone Palpitations   Sulfa Antibiotics Rash    Face turns red and stings    Antimicrobials this admission: Zosyn 9/12 >>   Dose adjustments this admission: N/A  Microbiology results: N/A   PLAN: Initiate Zosyn 3.375 g IV q8H If cultures are obtained, follow up results to assess for antibiotic optimization. Monitor renal function to assess for any necessary antibiotic dosing changes.   Thank you for allowing pharmacy to be a part of this patient's care.  Will M. Dareen Piano, PharmD Clinical Pharmacist 12/07/2022 7:57 AM

## 2022-12-07 NOTE — Assessment & Plan Note (Signed)
Baseline history of gastric ulcers Patient reports not being on antiplatelet or anticoagulation given history of bleeding Will monitor for now High-dose PPI Monitor

## 2022-12-07 NOTE — Assessment & Plan Note (Signed)
?   Statin.

## 2022-12-07 NOTE — ED Triage Notes (Signed)
Patient ambulatory to triage with steady gait, without difficulty or distress noted; pt reports lower abd pain x 3 days with no accomp symptoms; st hx diverticultis

## 2022-12-07 NOTE — H&P (Addendum)
History and Physical    Patient: Doris Barnes ZOX:[id number] DOB: 12/04/1940 DOA: 12/07/2022 DOS: the patient was seen and examined on 12/07/2022 PCP: Danella Penton, MD  Patient coming from: Home  Chief Complaint:  Chief Complaint  Patient presents with   Abdominal Pain   HPI: Doris Barnes is a 82 y.o. female with medical history significant of history of breast cancer, gastric ulcers, GERD, remote history of CVA, mitral valve prolapse presenting with diverticulitis.  Patient reports worsening left lower quadrant abdominal pain over past 3 to 4 days.  No diarrhea.  Minimal nausea.  Baseline history of diverticulosis in the past based on colonoscopy per patient.  Predominately followed in the [facility] system.  No chest pain or shortness of breath.  Denies any recent change in diet.  Overall decreased p.o. intake.  No remote history of gastric ulcers in the past.  No upper abdominal pain or black stools reported. Presented to the ER afebrile, hemodynamically stable.  Satting well on room air.  White count 14.6, hemoglobin 15, platelets 267, creatinine 0.8.  CT them pelvis with sigmoid diverticulitis intensive laboratory changes but no abscess or pneumoperitoneum.  Review of Systems: As mentioned in the history of present illness. All other systems reviewed and are negative. Past Medical History:  Diagnosis Date   Anemia    Anxiety    Asthma    Breast cancer (HCC) 12/14/2016   left breast   Cancer (HCC) skin   Chronic vulvitis    Colon polyp    Cystocele    Diverticulitis    Diverticulitis    Diverticulosis    Diverticulosis    Dyspareunia, female    Dyspnea    Dysrhythmia    Fibrocystic disease of both breasts    Gastritis    GERD (gastroesophageal reflux disease)    gastritis also   Hemorrhoids    History of hiatal hernia    Hypercholesteremia    Hypertension    IBS (irritable bowel syndrome)    IC (interstitial cystitis)    Migraine    Mitral valve prolapse    OP  (osteoporosis)    Personal history of chemotherapy    Personal history of radiation therapy    Positional vertigo    Rectocele    Squamous cell carcinoma    Stroke [facility])    TIA   Vaginal atrophy    Past Surgical History:  Procedure Laterality Date   APPENDECTOMY     BREAST BIOPSY Left 1998   core bx- neg   BREAST BIOPSY Left 2007   neg   BREAST BIOPSY Left 12/14/2016   left breast US core positive   BREAST EXCISIONAL BIOPSY Left    BREAST LUMPECTOMY Left 01/01/2017    INVASIVE MAMMARY CARCINOMA/Grade 3   CARDIAC CATHETERIZATION N/A 01/18/2015   Procedure: Left Heart Cath and Coronary Angiography;  Surgeon: Dalia Heading, MD;  Location: [facility] INVASIVE CV LAB;  Service: Cardiovascular;  Laterality: N/A;   CATARACT EXTRACTION W/PHACO Right 12/15/2015   Procedure: CATARACT EXTRACTION PHACO AND INTRAOCULAR LENS PLACEMENT (IOC);  Surgeon: Sallee Lange, MD;  Location: [facility] ORS;  Service: Ophthalmology;  Laterality: Right;  [place] 01:20 AP% 23.9 CDE 35.49 Fluid pack lot # [id number]   CATARACT EXTRACTION W/PHACO Left 12/29/2015   Procedure: CATARACT EXTRACTION PHACO AND INTRAOCULAR LENS PLACEMENT (IOC);  Surgeon: Sallee Lange, MD;  Location: [facility] ORS;  Service: Ophthalmology;  Laterality: Left;  [place]  01:20 AP% 25.1 CDE 32.86 Fluid pack lot # [id number]  COLON SURGERY     COLONOSCOPY WITH PROPOFOL N/A 02/21/2016   Procedure: COLONOSCOPY WITH PROPOFOL;  Surgeon: Scot Jun, MD;  Location: [facility] ENDOSCOPY;  Service: Endoscopy;  Laterality: N/A;   COLONOSCOPY WITH PROPOFOL N/A 01/03/2019   Procedure: COLONOSCOPY WITH PROPOFOL;  Surgeon: Christena Deem, MD;  Location: [facility] ENDOSCOPY;  Service: Endoscopy;  Laterality: N/A;   CORONARY ANGIOPLASTY     DILATION AND CURETTAGE OF UTERUS     ESOPHAGOGASTRODUODENOSCOPY (EGD) WITH PROPOFOL N/A 11/23/2017   Procedure: ESOPHAGOGASTRODUODENOSCOPY (EGD) WITH PROPOFOL;  Surgeon: Scot Jun, MD;  Location: [facility] ENDOSCOPY;  Service:  Endoscopy;  Laterality: N/A;   OOPHORECTOMY Bilateral    PARTIAL MASTECTOMY WITH NEEDLE LOCALIZATION Left 01/01/2017   Procedure: PARTIAL MASTECTOMY WITH NEEDLE LOCALIZATION;  Surgeon: Carolan Shiver, MD;  Location: [facility] ORS;  Service: General;  Laterality: Left;   PORTACATH PLACEMENT Right 01/01/2017   Procedure: INSERTION PORT-A-CATH;  Surgeon: Carolan Shiver, MD;  Location: [facility] ORS;  Service: General;  Laterality: Right;   SENTINEL NODE BIOPSY Left 01/01/2017   Procedure: SENTINEL NODE BIOPSY;  Surgeon: Carolan Shiver, MD;  Location: [facility] ORS;  Service: General;  Laterality: Left;   TONSILLECTOMY     VAGINAL HYSTERECTOMY     Social History:  reports that she quit smoking about 35 years ago. She started smoking about 68 years ago. She has a 33 pack-year smoking history. She has never used smokeless tobacco. She reports that she does not drink alcohol and does not use drugs.  Allergies  Allergen Reactions   Imipramine Pamoate Shortness Of Breath   Bisphosphonates Nausea Only   Amoxicillin-Pot Clavulanate Rash   Epinephrine Hcl (Nasal) Other (See Comments)    Difficulty breathing   Prednisone Palpitations   Sulfa Antibiotics Rash    Face turns red and stings    Family History  Problem Relation Age of Onset   Diabetes Mother    Diabetes Father    Colon cancer Sister    Diabetes Sister    Diabetes Maternal Aunt    Colon cancer Paternal Grandfather    Lung cancer Brother    Breast cancer Neg Hx    Ovarian cancer Neg Hx     Prior to Admission medications   Medication Sig Start Date End Date Taking? Authorizing Provider  acetaminophen (TYLENOL) 325 MG tablet Take 650 mg by mouth every 6 (six) hours as needed for headache.   Yes [provider]  ALPRAZolam (XANAX) 0.25 MG tablet Take 0.25 mg by mouth at bedtime as needed for anxiety.   Yes [provider]  Calcium Carbonate-Vitamin D 600-400 MG-UNIT tablet Take 1 tablet by mouth 2 (two) times  daily.   Yes [provider]  dibucaine (NUPERCAINAL) 1 % OINT Place 1 application rectally as needed for hemorrhoids. 03/30/21  Yes Pia Mau M, PA-C  famotidine (PEPCID) 40 MG tablet Take 40 mg by mouth at bedtime.   Yes [provider]  FLUoxetine (PROZAC) 40 MG capsule Take 40 mg by mouth daily. 12/18/16  Yes [provider]  furosemide (LASIX) 20 MG tablet Take 20 mg by mouth daily. 08/29/19  Yes [provider]  lansoprazole (PREVACID) 30 MG capsule Take 30 mg by mouth daily before breakfast.  07/24/14  Yes [provider]  loratadine (CLARITIN) 10 MG tablet Take 10 mg by mouth daily.   Yes [provider]  Magnesium 250 MG TABS Take 250 mg by mouth daily.   Yes [provider]  mirtazapine (REMERON) 7.5  MG tablet Take 7.5 mg by mouth at bedtime.   Yes [provider]  montelukast (SINGULAIR) 10 MG tablet Take 10 mg by mouth daily. 11/14/22  Yes [provider]  Multiple Vitamins-Minerals (PRESERVISION AREDS 2) CAPS Take 1 capsule by mouth 2 (two) times daily.   Yes [provider]  POTASSIUM PO Take 1,000 mg by mouth daily.   Yes [provider]  propranolol (INDERAL) 40 MG tablet Take 40 mg by mouth daily.   Yes [provider]  senna-docusate (SENOKOT-S) 8.6-50 MG tablet Take 2 tablets by mouth every evening.   Yes [provider]  simvastatin (ZOCOR) 20 MG tablet Take 20 mg by mouth daily at 8 pm. 11/04/16  Yes [provider]  spironolactone (ALDACTONE) 25 MG tablet Take 25 mg by mouth daily. 08/29/19  Yes [provider]  acidophilus (RISAQUAD) CAPS capsule Take 2 capsules by mouth daily. Patient not taking: Reported on 12/07/2022    [provider]  albuterol (VENTOLIN HFA) 108 (90 Base) MCG/ACT inhaler Inhale into the lungs. Patient not taking: Reported on 12/07/2022 02/27/22 02/27/23  [provider]  aspirin EC 81 MG tablet Take 81 mg by  mouth daily. Swallow whole. Patient not taking: Reported on 02/13/2022    [provider]  atenolol (TENORMIN) 50 MG tablet     [provider]  benzonatate (TESSALON) 200 MG capsule     [provider]  gabapentin (NEURONTIN) 100 MG capsule Take 300 mg by mouth at bedtime. Patient not taking: Reported on 07/29/2021 07/30/19   [provider]  LAGEVRIO 200 MG CAPS capsule Take 4 capsules by mouth 2 (two) times daily. Patient not taking: Reported on 12/07/2022 02/27/22   [provider]  polyethylene glycol (MIRALAX / GLYCOLAX) packet Take 17 g by mouth daily as needed for moderate constipation. Patient not taking: Reported on 12/30/2021    [provider]  Probiotic Product (ALIGN PO) Take by mouth. Patient not taking: Reported on 12/07/2022    [provider]  telmisartan (MICARDIS) 80 MG tablet Take 40 mg by mouth daily with breakfast. Patient not taking: Reported on 02/13/2022 11/23/16   [provider]  traZODone (DESYREL) 50 MG tablet Take 50 mg by mouth at bedtime. Patient not taking: Reported on 02/13/2022 07/22/21   [provider]  prochlorperazine (COMPAZINE) 10 MG tablet Take 1 tablet (10 mg total) by mouth every 6 (six) hours as needed (Nausea or vomiting). 01/04/17 04/06/17  Creig Hines, MD    Physical Exam: Vitals:   12/07/22 0528 12/07/22 0535 12/07/22 0630  BP:  (!) 166/74 (!) 129/54  Pulse:  93 84  Resp:  18 19  Temp:  98.4 F (36.9 C)   TempSrc:  Oral   SpO2:  95% 94%  Weight: 51.3 kg    Height: 5\' 1"  (1.549 m)     Physical Exam Constitutional:      Appearance: She is normal weight.  HENT:     Head: Normocephalic and atraumatic.     Nose: Nose normal.     Mouth/Throat:     Mouth: Mucous membranes are moist.  Cardiovascular:     Rate and Rhythm: Normal rate and regular rhythm.     Pulses: Normal pulses.  Pulmonary:     Effort: Pulmonary effort is normal.  Abdominal:     Comments: +  LLQ abd pain    Musculoskeletal:     Cervical back: Normal range of motion.  Skin:  General: Skin is warm.  Neurological:     General: No focal deficit present.  Psychiatric:        Mood and Affect: Mood normal.     Data Reviewed:  There are no new results to review at this time. CT ABDOMEN PELVIS W CONTRAST CLINICAL DATA:  Concerning, diffuse tenderness, question peritonitis. Evaluate diverticulitis  EXAM: CT ABDOMEN AND PELVIS WITH CONTRAST  TECHNIQUE: Multidetector CT imaging of the abdomen and pelvis was performed using the standard protocol following bolus administration of intravenous contrast.  RADIATION DOSE REDUCTION: This exam was performed according to the departmental dose-optimization program which includes automated exposure control, adjustment of the mA and/or kV according to patient size and/or use of iterative reconstruction technique.  CONTRAST:  75mL OMNIPAQUE IOHEXOL 300 MG/ML  SOLN  COMPARISON:  03/30/2021  FINDINGS: Lower chest: Small pulmonary nodules in the right lower lobe are unchanged at 5 mm laterally on the right as measured on 4:7 in foreign L medially on the right as measured on 4:10.  Hepatobiliary: No focal liver abnormality.No evidence of biliary obstruction or stone.  Pancreas: Unremarkable.  Spleen: Unremarkable.  Adrenals/Urinary Tract: Negative adrenals. No hydronephrosis or stone. Unremarkable bladder.  Stomach/Bowel: Submucosal edema in the sigmoid colon associated with numerous colonic diverticula, 1 sizable diverticulum projecting leftward showing focal inflammation. A diverticulum appears intact with no abscess or pneumoperitoneum.  Vascular/Lymphatic: No acute vascular abnormality. Extensive atheromatous calcification of the aorta and branch vessels. No mass or adenopathy.  Reproductive:Hysterectomy  Other: No ascites or pneumoperitoneum.  Musculoskeletal: No acute abnormalities. Generalized  spinal degeneration. Chronic L2 superior endplate fracture  IMPRESSION: Sigmoid diverticulitis. Intense inflammatory change but no abscess or pneumoperitoneum.  Electronically Signed   By: Tiburcio Pea M.D.   On: 12/07/2022 06:32  Lab Results  Component Value Date   WBC 14.6 (H) 12/07/2022   HGB 14.9 12/07/2022   HCT 44.4 12/07/2022   MCV 91.0 12/07/2022   PLT 267 12/07/2022   Last metabolic panel Lab Results  Component Value Date   GLUCOSE 137 (H) 12/07/2022   NA 137 12/07/2022   K 4.1 12/07/2022   CL 101 12/07/2022   CO2 27 12/07/2022   BUN 12 12/07/2022   CREATININE 0.80 12/07/2022   GFRNONAA >60 12/07/2022   CALCIUM 9.2 12/07/2022   PROT 7.7 12/07/2022   ALBUMIN 4.0 12/07/2022   BILITOT 0.9 12/07/2022   ALKPHOS 83 12/07/2022   AST 21 12/07/2022   ALT 18 12/07/2022   ANIONGAP 9 12/07/2022    Assessment and Plan: * Acute diverticulitis Worsening left lower quadrant abdominal pain over multiple days with noted diverticulitis on CT scan IV Zosyn Pain control Antiemetics IV fluids Monitor  Atrial fibrillation (HCC) Noted remote history of atrial fibrillation follow-up in the [facility] system Looks to not be on anticoagulation in the setting of history of gastric bleeding ulcers  Rate controlled  EKG pending Will otherwise monitor for now  Erosive gastritis Baseline history of gastric ulcers Patient reports not being on antiplatelet or anticoagulation given history of bleeding Will monitor for now High-dose PPI Monitor  TIA (transient ischemic attack) Remote history of TIA but no gross residual deficits noted Monitor  HLD (hyperlipidemia) Statin  Essential hypertension BP stable Titrate home regimen      Advance Care Planning:   Code Status: Full Code   Consults: none   Family Communication: Husband at the bedside   Severity of Illness: The appropriate patient status for this patient is INPATIENT. Inpatient status is judged  to be  reasonable and necessary in order to provide the required intensity of service to ensure the patient's safety. The patient's presenting symptoms, physical exam findings, and initial radiographic and laboratory data in the context of their chronic comorbidities is felt to place them at high risk for further clinical deterioration. Furthermore, it is not anticipated that the patient will be medically stable for discharge from the hospital within 2 midnights of admission.   * I certify that at the point of admission it is my clinical judgment that the patient will require inpatient hospital care spanning beyond 2 midnights from the point of admission due to high intensity of service, high risk for further deterioration and high frequency of surveillance required.*  Author: Floydene Flock, MD 12/07/2022 9:15 AM  For on call review [email or web address].

## 2022-12-07 NOTE — Assessment & Plan Note (Signed)
BP stable Titrate home regimen 

## 2022-12-08 DIAGNOSIS — K5792 Diverticulitis of intestine, part unspecified, without perforation or abscess without bleeding: Secondary | ICD-10-CM | POA: Diagnosis not present

## 2022-12-08 LAB — CBC
HCT: 35.5 % — ABNORMAL LOW (ref 36.0–46.0)
Hemoglobin: 11.8 g/dL — ABNORMAL LOW (ref 12.0–15.0)
MCH: 30.5 pg (ref 26.0–34.0)
MCHC: 33.2 g/dL (ref 30.0–36.0)
MCV: 91.7 fL (ref 80.0–100.0)
Platelets: 182 10*3/uL (ref 150–400)
RBC: 3.87 MIL/uL (ref 3.87–5.11)
RDW: 12.6 % (ref 11.5–15.5)
WBC: 8.5 10*3/uL (ref 4.0–10.5)
nRBC: 0 % (ref 0.0–0.2)

## 2022-12-08 LAB — COMPREHENSIVE METABOLIC PANEL
ALT: 18 U/L (ref 0–44)
AST: 20 U/L (ref 15–41)
Albumin: 3 g/dL — ABNORMAL LOW (ref 3.5–5.0)
Alkaline Phosphatase: 59 U/L (ref 38–126)
Anion gap: 9 (ref 5–15)
BUN: 9 mg/dL (ref 8–23)
CO2: 26 mmol/L (ref 22–32)
Calcium: 8.2 mg/dL — ABNORMAL LOW (ref 8.9–10.3)
Chloride: 104 mmol/L (ref 98–111)
Creatinine, Ser: 0.79 mg/dL (ref 0.44–1.00)
GFR, Estimated: >60 mL/min (ref >60)
Glucose, Bld: 106 mg/dL — ABNORMAL HIGH (ref 70–99)
Potassium: 4.1 mmol/L (ref 3.5–5.1)
Sodium: 139 mmol/L (ref 135–145)
Total Bilirubin: 0.8 mg/dL (ref 0.3–1.2)
Total Protein: 5.7 g/dL — ABNORMAL LOW (ref 6.5–8.1)

## 2022-12-08 MED ORDER — HYDROXYZINE HCL 25 MG PO TABS
50.0000 mg | ORAL_TABLET | Freq: Every day | ORAL | Status: DC
  Administered 2022-12-08: 50 mg via ORAL
  Filled 2022-12-08: qty 2

## 2022-12-08 MED ORDER — SODIUM CHLORIDE 0.9 % IV SOLN: INTRAVENOUS | Status: DC | PRN

## 2022-12-08 MED ORDER — HYDROCODONE-ACETAMINOPHEN 5-325 MG PO TABS: 1.0000 | ORAL_TABLET | ORAL | Status: DC | PRN

## 2022-12-08 NOTE — Consult Note (Signed)
Pharmacy Antibiotic Note  ASSESSMENT: 82 y.o. female with PMH including adaptive colitis, ischemic colitis, erosive gastritis, and breast cancer is presenting with  intra-abdominal infection . Patient endorses progressively worsening abdominal pain the past 2 to 3 days. She has been able to have bowel movements. She is VSS but does have leukocytosis. CTAP notable for sigmoid diverticulitis without abscess or pneumoperitoneum. Pharmacy has been consulted to manage Zosyn dosing and patient received her first dose this morning.  Patient has an allergy (rash) to Augmentin listed in her profile but she tolerated the first dose of Zosyn without incident.  Patient measurements: Height: 5\' 1"  (154.9 cm) Weight: 51.3 kg (113 lb) IBW/kg (Calculated) : 47.8  Vital signs: Temp: 98.9 F (37.2 C) (09/13 0415) Temp Source: Oral (09/13 0415) BP: 111/46 (09/13 0415) Pulse Rate: 86 (09/13 0415) Recent Labs  Lab 12/07/22 0545 12/08/22 0423  WBC 14.6* 8.5  CREATININE 0.80 0.79   Estimated Creatinine Clearance: 40.9 mL/min (by C-G formula based on SCr of 0.79 mg/dL).  Allergies: Allergies  Allergen Reactions   Imipramine Pamoate Shortness Of Breath   Bisphosphonates Nausea Only   Amoxicillin-Pot Clavulanate Rash   Epinephrine Hcl (Nasal) Other (See Comments)    Difficulty breathing   Prednisone Palpitations   Sulfa Antibiotics Rash    Face turns red and stings    Antimicrobials this admission: Zosyn 9/12 >>   Dose adjustments this admission: N/A  Microbiology results: N/A   PLAN: Continue Zosyn 3.375 g IV q8H If cultures are obtained, follow up results to assess for antibiotic optimization. Monitor renal function to assess for any necessary antibiotic dosing changes.   Thank you for allowing pharmacy to be a part of this patient's care.  Barrie Folk, PharmD Clinical Pharmacist 12/08/2022 6:56 AM

## 2022-12-08 NOTE — Progress Notes (Signed)
PROGRESS NOTE    Doris Barnes  MVH:[id number] DOB: 07-18-1940 DOA: 12/07/2022 PCP: Doris Penton, MD  [id number]  LOS: 1 day   Brief hospital course:   Assessment & Plan: Doris Barnes is a 82 y.o. female with medical history significant of history of breast cancer, gastric ulcers, GERD, remote history of CVA, mitral valve prolapse presenting with diverticulitis.  Patient reports worsening left lower quadrant abdominal pain over past 3 to 4 days.  No diarrhea.  Minimal nausea.  Baseline history of diverticulosis in the past based on colonoscopy per patient.    * Acute diverticulitis Worsening left lower quadrant abdominal pain over multiple days with noted diverticulitis on CT scan --cont IV zosyn --advance to soft diet  Atrial fibrillation (HCC) Noted remote history of atrial fibrillation follow-up in the [facility] system Looks to not be on anticoagulation in the setting of history of gastric bleeding ulcers  Rate controlled   Erosive gastritis Baseline history of gastric ulcers Patient reports not being on antiplatelet or anticoagulation given history of bleeding --cont PPI BID  HLD (hyperlipidemia) --resume home statin after discharge  Essential hypertension BP wnl   DVT prophylaxis: Lovenox SQ Code Status: Full code  Family Communication: family updated at bedside today Level of care: Med-Surg Dispo:   The patient is from: home Anticipated d/c is to: home Anticipated d/c date is: tomorrow   Subjective and Interval History:  Pt reported abdominal pain improved.  Tolerating full liquid diet.  Complained of not being able to sleep.   Objective: Vitals:   12/07/22 1934 12/08/22 0415 12/08/22 0838 12/08/22 1532  BP: (!) 117/52 (!) 111/46 (!) 124/57 (!) 120/50  Pulse: 77 86 90 80  Resp: 20 20 17 16   Temp: 98.7 F (37.1 C) 98.9 F (37.2 C) 98.2 F (36.8 C) 98.4 F (36.9 C)  TempSrc: Oral Oral Oral Oral  SpO2: 93% 90% 91% 93%  Weight:      Height:         Intake/Output Summary (Last 24 hours) at 12/08/2022 1823 Last data filed at 12/07/2022 1853 Gross per 24 hour  Intake 166.86 ml  Output --  Net 166.86 ml   Filed Weights   12/07/22 0528  Weight: 51.3 kg    Examination:   Constitutional: NAD, AAOx3 HEENT: conjunctivae and lids normal, EOMI CV: No cyanosis.   RESP: normal respiratory effort, on RA Neuro: II - XII grossly intact.   Psych: Normal mood and affect.  Appropriate judgement and reason   Data Reviewed: I have personally reviewed labs and imaging studies  Time spent: 35 minutes  Darlin Priestly, MD [facility] If 7PM-7AM, please contact night-coverage 12/08/2022, 6:23 PM

## 2022-12-08 NOTE — Plan of Care (Signed)

## 2022-12-09 DIAGNOSIS — K5792 Diverticulitis of intestine, part unspecified, without perforation or abscess without bleeding: Secondary | ICD-10-CM | POA: Diagnosis not present

## 2022-12-09 MED ORDER — METRONIDAZOLE 50 MG/ML ORAL SUSPENSION: 500.0000 mg | Freq: Three times a day (TID) | ORAL | 0 refills | Status: DC

## 2022-12-09 MED ORDER — METRONIDAZOLE 50 MG/ML ORAL SUSPENSION: 500.0000 mg | Freq: Three times a day (TID) | ORAL | Status: DC

## 2022-12-09 MED ORDER — MOXIFLOXACIN HCL 400 MG PO TABS: 400.0000 mg | ORAL_TABLET | Freq: Every day | ORAL | 0 refills | Status: AC

## 2022-12-09 MED ORDER — CIPROFLOXACIN HCL 500 MG PO TABS
500.0000 mg | ORAL_TABLET | Freq: Two times a day (BID) | ORAL | Status: DC
  Administered 2022-12-09: 500 mg via ORAL
  Filled 2022-12-09: qty 1

## 2022-12-09 MED ORDER — CIPROFLOXACIN HCL 500 MG PO TABS: 500.0000 mg | ORAL_TABLET | Freq: Two times a day (BID) | ORAL | 0 refills | Status: DC

## 2022-12-09 MED ORDER — METRONIDAZOLE 500 MG PO TABS
500.0000 mg | ORAL_TABLET | Freq: Three times a day (TID) | ORAL | Status: DC
  Filled 2022-12-09 (×2): qty 1

## 2022-12-09 NOTE — Discharge Summary (Signed)
Physician Discharge Summary   Doris Barnes  female DOB: 1940/08/15  WJX:[id number]  PCP: Danella Penton, MD  Admit date: 12/07/2022 Discharge date: 12/09/2022  Admitted From: home Disposition:  home CODE STATUS: Full code  Discharge Instructions     Discharge instructions   Complete by: As directed    You have received 3 days of IV antibiotic for your diverticulitis.  Please finish 4 more days of oral antibiotic moxifloxacin starting this afternoon. [facility] Course:  For full details, please see H&P, progress notes, consult notes and ancillary notes.  Briefly,  Doris Barnes is a 82 y.o. female with medical history significant of history of breast cancer, gastric ulcers, remote history of CVA, mitral valve prolapse presenting with diverticulitis.    Patient reports worsening left lower quadrant abdominal pain over past 3 to 4 days.  No diarrhea.  Minimal nausea.  Baseline history of diverticulosis in the past based on colonoscopy per patient.    * Acute diverticulitis Worsening left lower quadrant abdominal pain over multiple days with noted diverticulitis on CT scan.  Pt was started on IV zosyn with rapid improvement in abdomina pain, and able to tolerate diet prior to discharge. --Pt is allergic to Augmentin, and could not take Flagyl due to it's pill size (and her pharmacy does not carry liquid form), so after discussing with inpatient pharm, pt was dishcarged on 4 more days of moxifloxacin.   Paroxysmal Atrial fibrillation (HCC) Noted remote history of atrial fibrillation follow-up in the [facility] system Looks to not be on anticoagulation in the setting of history of gastric bleeding ulcers  --NSR on presentation   Hx of Erosive gastritis Patient reports not being on antiplatelet or anticoagulation given history of bleeding --resume home Pepcid and Prevacid after discharge.   HLD (hyperlipidemia) --resume home statin after discharge   Essential  hypertension --on lasix, propranolol and spironolactone at home, to be resumed after discharge.   Unless noted above, medications under "STOP" list are ones pt was not taking PTA.  Discharge Diagnoses:  Principal Problem:   Acute diverticulitis Active Problems:   Essential hypertension   HLD (hyperlipidemia)   TIA (transient ischemic attack)   Erosive gastritis   Atrial fibrillation (HCC)   30 Day Unplanned Readmission Risk Score    Flowsheet Row ED to Hosp-Admission (Current) from 12/07/2022 in [facility]  30 Day Unplanned Readmission Risk Score (%) 17.99 Filed at 12/09/2022 0800       This score is the patient's risk of an unplanned readmission within 30 days of being discharged (0 -100%). The score is based on dignosis, age, lab data, medications, orders, and past utilization.   Low:  0-14.9   Medium: 15-21.9   High: 22-29.9   Extreme: 30 and above         Discharge Instructions:  Allergies as of 12/09/2022       Reactions   Imipramine Pamoate Shortness Of Breath   Bisphosphonates Nausea Only   Amoxicillin-pot Clavulanate Rash   Epinephrine Hcl (nasal) Other (See Comments)   Difficulty breathing   Prednisone Palpitations   Sulfa Antibiotics Rash   Face turns red and stings        Medication List     STOP taking these medications    acidophilus Caps capsule   albuterol 108 (90 Base) MCG/ACT inhaler Commonly known as: VENTOLIN HFA   ALIGN PO   aspirin EC 81 MG tablet  atenolol 50 MG tablet Commonly known as: TENORMIN   benzonatate 200 MG capsule Commonly known as: TESSALON   gabapentin 100 MG capsule Commonly known as: NEURONTIN   Lagevrio 200 MG Caps capsule Generic drug: molnupiravir EUA   polyethylene glycol 17 g packet Commonly known as: MIRALAX / GLYCOLAX   telmisartan 80 MG tablet Commonly known as: MICARDIS   traZODone 50 MG tablet Commonly known as: DESYREL       TAKE these medications     acetaminophen 325 MG tablet Commonly known as: TYLENOL Take 650 mg by mouth every 6 (six) hours as needed for headache.   ALPRAZolam 0.25 MG tablet Commonly known as: XANAX Take 0.25 mg by mouth at bedtime as needed for anxiety.   Calcium Carbonate-Vitamin D 600-400 MG-UNIT tablet Take 1 tablet by mouth 2 (two) times daily.   dibucaine 1 % Oint Commonly known as: NUPERCAINAL Place 1 application rectally as needed for hemorrhoids.   famotidine 40 MG tablet Commonly known as: PEPCID Take 40 mg by mouth at bedtime.   FLUoxetine 40 MG capsule Commonly known as: PROZAC Take 40 mg by mouth daily.   furosemide 20 MG tablet Commonly known as: LASIX Take 20 mg by mouth daily.   lansoprazole 30 MG capsule Commonly known as: PREVACID Take 30 mg by mouth daily before breakfast.   loratadine 10 MG tablet Commonly known as: CLARITIN Take 10 mg by mouth daily.   Magnesium 250 MG Tabs Take 250 mg by mouth daily.   mirtazapine 7.5 MG tablet Commonly known as: REMERON Take 7.5 mg by mouth at bedtime.   montelukast 10 MG tablet Commonly known as: SINGULAIR Take 10 mg by mouth daily.   moxifloxacin 400 MG tablet Commonly known as: AVELOX Take 1 tablet (400 mg total) by mouth daily at 8 pm for 4 days.   POTASSIUM PO Take 1,000 mg by mouth daily.   PreserVision AREDS 2 Caps Take 1 capsule by mouth 2 (two) times daily.   propranolol 40 MG tablet Commonly known as: INDERAL Take 40 mg by mouth daily.   senna-docusate 8.6-50 MG tablet Commonly known as: Senokot-S Take 2 tablets by mouth every evening.   simvastatin 20 MG tablet Commonly known as: ZOCOR Take 20 mg by mouth daily at 8 pm.   spironolactone 25 MG tablet Commonly known as: ALDACTONE Take 25 mg by mouth daily.         Follow-up Information     Danella Penton, MD Follow up in 1 week(s).   Specialty: Internal Medicine Contact information: [address] [facility] [place]  [place] [address] [phone]                 Allergies  Allergen Reactions   Imipramine Pamoate Shortness Of Breath   Bisphosphonates Nausea Only   Amoxicillin-Pot Clavulanate Rash   Epinephrine Hcl (Nasal) Other (See Comments)    Difficulty breathing   Prednisone Palpitations   Sulfa Antibiotics Rash    Face turns red and stings     The results of significant diagnostics from this hospitalization (including imaging, microbiology, ancillary and laboratory) are listed below for reference.   Consultations:   Procedures/Studies: CT ABDOMEN PELVIS W CONTRAST  Result Date: 12/07/2022 CLINICAL DATA:  Concerning, diffuse tenderness, question peritonitis. Evaluate diverticulitis EXAM: CT ABDOMEN AND PELVIS WITH CONTRAST TECHNIQUE: Multidetector CT imaging of the abdomen and pelvis was performed using the standard protocol following bolus administration of intravenous contrast. RADIATION DOSE REDUCTION: This exam was  performed according to the departmental dose-optimization program which includes automated exposure control, adjustment of the mA and/or kV according to patient size and/or use of iterative reconstruction technique. CONTRAST:  75mL OMNIPAQUE IOHEXOL 300 MG/ML  SOLN COMPARISON:  03/30/2021 FINDINGS: Lower chest: Small pulmonary nodules in the right lower lobe are unchanged at 5 mm laterally on the right as measured on 4:7 in foreign L medially on the right as measured on 4:10. Hepatobiliary: No focal liver abnormality.No evidence of biliary obstruction or stone. Pancreas: Unremarkable. Spleen: Unremarkable. Adrenals/Urinary Tract: Negative adrenals. No hydronephrosis or stone. Unremarkable bladder. Stomach/Bowel: Submucosal edema in the sigmoid colon associated with numerous colonic diverticula, 1 sizable diverticulum projecting leftward showing focal inflammation. A diverticulum appears intact with no abscess or pneumoperitoneum. Vascular/Lymphatic: No acute vascular  abnormality. Extensive atheromatous calcification of the aorta and branch vessels. No mass or adenopathy. Reproductive:Hysterectomy Other: No ascites or pneumoperitoneum. Musculoskeletal: No acute abnormalities. Generalized spinal degeneration. Chronic L2 superior endplate fracture IMPRESSION: Sigmoid diverticulitis. Intense inflammatory change but no abscess or pneumoperitoneum. Electronically Signed   By: Tiburcio Pea M.D.   On: 12/07/2022 06:32      Labs: BNP (last 3 results) No results for input(s): "BNP" in the last 8760 hours. Basic Metabolic Panel: Recent Labs  Lab 12/07/22 0545 12/08/22 0423  NA 137 139  K 4.1 4.1  CL 101 104  CO2 27 26  GLUCOSE 137* 106*  BUN 12 9  CREATININE 0.80 0.79  CALCIUM 9.2 8.2*   Liver Function Tests: Recent Labs  Lab 12/07/22 0545 12/08/22 0423  AST 21 20  ALT 18 18  ALKPHOS 83 59  BILITOT 0.9 0.8  PROT 7.7 5.7*  ALBUMIN 4.0 3.0*   Recent Labs  Lab 12/07/22 0545  LIPASE 35   No results for input(s): "AMMONIA" in the last 168 hours. CBC: Recent Labs  Lab 12/07/22 0545 12/08/22 0423  WBC 14.6* 8.5  NEUTROABS 11.3*  --   HGB 14.9 11.8*  HCT 44.4 35.5*  MCV 91.0 91.7  PLT 267 182   Cardiac Enzymes: No results for input(s): "CKTOTAL", "CKMB", "CKMBINDEX", "TROPONINI" in the last 168 hours. BNP: Invalid input(s): "POCBNP" CBG: No results for input(s): "GLUCAP" in the last 168 hours. D-Dimer No results for input(s): "DDIMER" in the last 72 hours. Hgb A1c No results for input(s): "HGBA1C" in the last 72 hours. Lipid Profile No results for input(s): "CHOL", "HDL", "LDLCALC", "TRIG", "CHOLHDL", "LDLDIRECT" in the last 72 hours. Thyroid function studies No results for input(s): "TSH", "T4TOTAL", "T3FREE", "THYROIDAB" in the last 72 hours.  Invalid input(s): "FREET3" Anemia work up No results for input(s): "VITAMINB12", "FOLATE", "FERRITIN", "TIBC", "IRON", "RETICCTPCT" in the last 72 hours. Urinalysis    Component  Value Date/Time   COLORURINE YELLOW (A) 12/07/2022 0537   APPEARANCEUR CLEAR (A) 12/07/2022 0537   APPEARANCEUR Hazy 11/23/2011 0609   LABSPEC 1.010 12/07/2022 0537   LABSPEC 1.013 11/23/2011 0609   PHURINE 7.0 12/07/2022 0537   GLUCOSEU NEGATIVE 12/07/2022 0537   GLUCOSEU Negative 11/23/2011 0609   HGBUR NEGATIVE 12/07/2022 0537   BILIRUBINUR NEGATIVE 12/07/2022 0537   BILIRUBINUR Negative 11/23/2011 0609   KETONESUR NEGATIVE 12/07/2022 0537   PROTEINUR NEGATIVE 12/07/2022 0537   NITRITE NEGATIVE 12/07/2022 0537   LEUKOCYTESUR NEGATIVE 12/07/2022 0537   LEUKOCYTESUR Negative 11/23/2011 0609   Sepsis Labs Recent Labs  Lab 12/07/22 0545 12/08/22 0423  WBC 14.6* 8.5   Microbiology No results found for this or any previous visit (from the past 240 hour(s)).  Total time spend on discharging this patient, including the last patient exam, discussing the hospital stay, instructions for ongoing care as it relates to all pertinent caregivers, as well as preparing the medical discharge records, prescriptions, and/or referrals as applicable, is 45 minutes.    Darlin Priestly, MD  [facility] 12/09/2022, 9:22 AM

## 2022-12-09 NOTE — Plan of Care (Signed)

## 2022-12-09 NOTE — Plan of Care (Signed)
Resolve care plan. Adequate for discharge.  Cornell Barman Derita Michelsen

## 2022-12-12 DIAGNOSIS — K5792 Diverticulitis of intestine, part unspecified, without perforation or abscess without bleeding: Secondary | ICD-10-CM | POA: Diagnosis not present

## 2022-12-18 ENCOUNTER — Other Ambulatory Visit
Admission: RE | Admit: 2022-12-18 | Discharge: 2022-12-18 | Disposition: A | Discharge status: Home / Self Care | Payer: Medicare HMO | Source: Ambulatory Visit | Attending: Internal Medicine | Admitting: Internal Medicine

## 2022-12-18 DIAGNOSIS — R0609 Other forms of dyspnea: Secondary | ICD-10-CM | POA: Diagnosis not present

## 2022-12-18 DIAGNOSIS — I341 Nonrheumatic mitral (valve) prolapse: Secondary | ICD-10-CM | POA: Diagnosis not present

## 2022-12-18 DIAGNOSIS — I10 Essential (primary) hypertension: Secondary | ICD-10-CM | POA: Diagnosis not present

## 2022-12-18 DIAGNOSIS — I493 Ventricular premature depolarization: Secondary | ICD-10-CM | POA: Diagnosis not present

## 2022-12-18 DIAGNOSIS — I480 Paroxysmal atrial fibrillation: Secondary | ICD-10-CM | POA: Diagnosis not present

## 2022-12-18 LAB — BRAIN NATRIURETIC PEPTIDE: B Natriuretic Peptide: 73.8 pg/mL (ref 0.0–100.0)

## 2023-01-02 DIAGNOSIS — R739 Hyperglycemia, unspecified: Secondary | ICD-10-CM | POA: Diagnosis not present

## 2023-01-02 DIAGNOSIS — E538 Deficiency of other specified B group vitamins: Secondary | ICD-10-CM | POA: Diagnosis not present

## 2023-01-02 DIAGNOSIS — E782 Mixed hyperlipidemia: Secondary | ICD-10-CM | POA: Diagnosis not present

## 2023-01-03 DIAGNOSIS — R0609 Other forms of dyspnea: Secondary | ICD-10-CM | POA: Diagnosis not present

## 2023-01-03 DIAGNOSIS — I25119 Atherosclerotic heart disease of native coronary artery with unspecified angina pectoris: Secondary | ICD-10-CM | POA: Diagnosis not present

## 2023-01-08 DIAGNOSIS — H353124 Nonexudative age-related macular degeneration, left eye, advanced atrophic with subfoveal involvement: Secondary | ICD-10-CM | POA: Diagnosis not present

## 2023-01-09 DIAGNOSIS — F32A Depression, unspecified: Secondary | ICD-10-CM | POA: Diagnosis not present

## 2023-01-09 DIAGNOSIS — E785 Hyperlipidemia, unspecified: Secondary | ICD-10-CM | POA: Diagnosis not present

## 2023-01-09 DIAGNOSIS — Z0000 Encounter for general adult medical examination without abnormal findings: Secondary | ICD-10-CM | POA: Diagnosis not present

## 2023-01-10 ENCOUNTER — Other Ambulatory Visit: Payer: Self-pay | Admitting: Internal Medicine

## 2023-01-10 DIAGNOSIS — Z1231 Encounter for screening mammogram for malignant neoplasm of breast: Secondary | ICD-10-CM

## 2023-02-12 ENCOUNTER — Ambulatory Visit
Admission: RE | Admit: 2023-02-12 | Discharge: 2023-02-12 | Disposition: A | Discharge status: Home / Self Care | Payer: Medicare HMO | Source: Ambulatory Visit | Attending: Internal Medicine | Admitting: Internal Medicine

## 2023-02-12 DIAGNOSIS — Z1231 Encounter for screening mammogram for malignant neoplasm of breast: Secondary | ICD-10-CM | POA: Diagnosis not present

## 2023-02-13 DIAGNOSIS — M7912 Myalgia of auxiliary muscles, head and neck: Secondary | ICD-10-CM | POA: Diagnosis not present

## 2023-02-13 DIAGNOSIS — M542 Cervicalgia: Secondary | ICD-10-CM | POA: Diagnosis not present

## 2023-02-13 DIAGNOSIS — F32A Depression, unspecified: Secondary | ICD-10-CM | POA: Diagnosis not present

## 2023-02-13 DIAGNOSIS — J4540 Moderate persistent asthma, uncomplicated: Secondary | ICD-10-CM | POA: Diagnosis not present

## 2023-02-26 DIAGNOSIS — H353112 Nonexudative age-related macular degeneration, right eye, intermediate dry stage: Secondary | ICD-10-CM | POA: Diagnosis not present

## 2023-03-05 DIAGNOSIS — H353124 Nonexudative age-related macular degeneration, left eye, advanced atrophic with subfoveal involvement: Secondary | ICD-10-CM | POA: Diagnosis not present

## 2023-03-30 DIAGNOSIS — D2261 Melanocytic nevi of right upper limb, including shoulder: Secondary | ICD-10-CM | POA: Diagnosis not present

## 2023-03-30 DIAGNOSIS — Z08 Encounter for follow-up examination after completed treatment for malignant neoplasm: Secondary | ICD-10-CM | POA: Diagnosis not present

## 2023-03-30 DIAGNOSIS — D2272 Melanocytic nevi of left lower limb, including hip: Secondary | ICD-10-CM | POA: Diagnosis not present

## 2023-03-30 DIAGNOSIS — D225 Melanocytic nevi of trunk: Secondary | ICD-10-CM | POA: Diagnosis not present

## 2023-03-30 DIAGNOSIS — Z85828 Personal history of other malignant neoplasm of skin: Secondary | ICD-10-CM | POA: Diagnosis not present

## 2023-03-30 DIAGNOSIS — D2262 Melanocytic nevi of left upper limb, including shoulder: Secondary | ICD-10-CM | POA: Diagnosis not present

## 2023-03-30 DIAGNOSIS — D2271 Melanocytic nevi of right lower limb, including hip: Secondary | ICD-10-CM | POA: Diagnosis not present

## 2023-03-30 DIAGNOSIS — L821 Other seborrheic keratosis: Secondary | ICD-10-CM | POA: Diagnosis not present

## 2023-04-05 DIAGNOSIS — Z8719 Personal history of other diseases of the digestive system: Secondary | ICD-10-CM | POA: Diagnosis not present

## 2023-04-05 DIAGNOSIS — K5909 Other constipation: Secondary | ICD-10-CM | POA: Diagnosis not present

## 2023-04-05 DIAGNOSIS — K649 Unspecified hemorrhoids: Secondary | ICD-10-CM | POA: Diagnosis not present

## 2023-04-05 DIAGNOSIS — D500 Iron deficiency anemia secondary to blood loss (chronic): Secondary | ICD-10-CM | POA: Diagnosis not present

## 2023-05-02 ENCOUNTER — Emergency Department: Payer: Medicare HMO

## 2023-05-02 ENCOUNTER — Emergency Department
Admission: EM | Admit: 2023-05-02 | Discharge: 2023-05-02 | Disposition: A | Discharge status: Home / Self Care | Payer: Medicare HMO | Attending: Emergency Medicine | Admitting: Emergency Medicine

## 2023-05-02 ENCOUNTER — Other Ambulatory Visit: Payer: Self-pay

## 2023-05-02 DIAGNOSIS — K5732 Diverticulitis of large intestine without perforation or abscess without bleeding: Secondary | ICD-10-CM | POA: Diagnosis not present

## 2023-05-02 DIAGNOSIS — K5792 Diverticulitis of intestine, part unspecified, without perforation or abscess without bleeding: Secondary | ICD-10-CM

## 2023-05-02 DIAGNOSIS — R1032 Left lower quadrant pain: Secondary | ICD-10-CM | POA: Diagnosis not present

## 2023-05-02 DIAGNOSIS — Z0389 Encounter for observation for other suspected diseases and conditions ruled out: Secondary | ICD-10-CM | POA: Diagnosis not present

## 2023-05-02 DIAGNOSIS — Z853 Personal history of malignant neoplasm of breast: Secondary | ICD-10-CM | POA: Insufficient documentation

## 2023-05-02 DIAGNOSIS — I700 Atherosclerosis of aorta: Secondary | ICD-10-CM | POA: Diagnosis not present

## 2023-05-02 LAB — COMPREHENSIVE METABOLIC PANEL
ALT: 17 U/L (ref 0–44)
AST: 20 U/L (ref 15–41)
Albumin: 3.8 g/dL (ref 3.5–5.0)
Alkaline Phosphatase: 88 U/L (ref 38–126)
Anion gap: 10 (ref 5–15)
BUN: 17 mg/dL (ref 8–23)
CO2: 27 mmol/L (ref 22–32)
Calcium: 9.6 mg/dL (ref 8.9–10.3)
Chloride: 100 mmol/L (ref 98–111)
Creatinine, Ser: 1 mg/dL (ref 0.44–1.00)
GFR, Estimated: 56 mL/min — ABNORMAL LOW (ref >60)
Glucose, Bld: 115 mg/dL — ABNORMAL HIGH (ref 70–99)
Potassium: 4.4 mmol/L (ref 3.5–5.1)
Sodium: 137 mmol/L (ref 135–145)
Total Bilirubin: 0.5 mg/dL (ref 0.0–1.2)
Total Protein: 7.7 g/dL (ref 6.5–8.1)

## 2023-05-02 LAB — CBC
HCT: 43.9 % (ref 36.0–46.0)
Hemoglobin: 14.5 g/dL (ref 12.0–15.0)
MCH: 30.9 pg (ref 26.0–34.0)
MCHC: 33 g/dL (ref 30.0–36.0)
MCV: 93.4 fL (ref 80.0–100.0)
Platelets: 283 10*3/uL (ref 150–400)
RBC: 4.7 MIL/uL (ref 3.87–5.11)
RDW: 12.9 % (ref 11.5–15.5)
WBC: 9.6 10*3/uL (ref 4.0–10.5)
nRBC: 0 % (ref 0.0–0.2)

## 2023-05-02 LAB — LIPASE, BLOOD: Lipase: 42 U/L (ref 11–51)

## 2023-05-02 MED ORDER — ONDANSETRON HCL 4 MG/2ML IJ SOLN
4.0000 mg | Freq: Once | INTRAMUSCULAR | Status: AC
  Administered 2023-05-02: 4 mg via INTRAVENOUS
  Filled 2023-05-02: qty 2

## 2023-05-02 MED ORDER — IOHEXOL 350 MG/ML SOLN
100.0000 mL | Freq: Once | INTRAVENOUS | Status: AC | PRN
  Administered 2023-05-02: 100 mL via INTRAVENOUS

## 2023-05-02 MED ORDER — FENTANYL CITRATE PF 50 MCG/ML IJ SOSY
25.0000 ug | PREFILLED_SYRINGE | Freq: Once | INTRAMUSCULAR | Status: AC
  Administered 2023-05-02: 25 ug via INTRAVENOUS
  Filled 2023-05-02: qty 1

## 2023-05-02 MED ORDER — ONDANSETRON 4 MG PO TBDP: 4.0000 mg | ORAL_TABLET | Freq: Three times a day (TID) | ORAL | 0 refills | Status: AC | PRN

## 2023-05-02 MED ORDER — OXYCODONE HCL 5 MG PO TABS
2.5000 mg | ORAL_TABLET | Freq: Four times a day (QID) | ORAL | 0 refills | Status: AC | PRN
Start: 2023-05-02 — End: 2023-05-07

## 2023-05-02 MED ORDER — MOXIFLOXACIN HCL 400 MG PO TABS: 400.0000 mg | ORAL_TABLET | Freq: Every day | ORAL | 0 refills | Status: AC

## 2023-05-02 MED ORDER — OXYCODONE HCL 5 MG PO TABS
2.5000 mg | ORAL_TABLET | Freq: Once | ORAL | Status: AC
  Administered 2023-05-02: 2.5 mg via ORAL
  Filled 2023-05-02: qty 1

## 2023-05-02 MED ORDER — MOXIFLOXACIN HCL 400 MG PO TABS: 400.0000 mg | ORAL_TABLET | Freq: Every day | ORAL | 0 refills | Status: DC

## 2023-05-02 NOTE — ED Triage Notes (Signed)
 Pt reports LLQ abd pain that radiates into her groin and rectum, pt denies v/d states she feels nauseous. Pt denies dysuria denies blood in stool or new constipation. Pt reports she takes something daily for her bowels because she is normally constipated.

## 2023-05-02 NOTE — Discharge Instructions (Addendum)
 It is important to do a liquid diet and bowel rest with low fiber foods to help this heal.  I have also prescribed you some antibiotics to take, Zofran  to help with any nausea and a few pain medication.  However if you develop worsening pain inability to tolerate p.o. or any other concerns you should return to the ER for repeat evaluation  Take oxycodone  as prescribed. Do not drink alcohol, drive or participate in any other potentially dangerous activities while taking this medication as it may make you sleepy. Do not take this medication with any other sedating medications, either prescription or over-the-counter. If you were prescribed Percocet or Vicodin, do not take these with acetaminophen  (Tylenol ) as it is already contained within these medications.  This medication is an opiate (or narcotic) pain medication and can be habit forming. Use it as little as possible to achieve adequate pain control. Do not use or use it with extreme caution if you have a history of opiate abuse or dependence. If you are on a pain contract with your primary care doctor or a pain specialist, be sure to let them know you were prescribed this medication today from the Emergency Department. This medication is intended for your use only - do not give any to anyone else and keep it in a secure place where nobody else, especially children, have access to it.      IMPRESSION: 1. Acute sigmoid diverticulitis. No diverticular abscess or perforation. 2. No CT evidence of mesenteric ischemia. 3.  Aortic Atherosclerosis (ICD10-I70.0).

## 2023-05-02 NOTE — ED Provider Notes (Signed)
 [facility] Provider Note    Event Date/Time   First MD Initiated Contact with Patient 05/02/23 [phone]     (approximate)   History   Abdominal Pain   HPI  Doris Barnes is a 83 y.o. female who comes in with left lower quadrant abdominal pain that radiates into her groin and rectum.  Associated with some nausea.  On review of records from her discharge summary on 12/09/2022 patient has a history of breast cancer, gastric ulcers, CVA, diverticulitis.  Patient had to be admitted previously for diverticulitis and placed on IV Zofran .  Patient is allergic to Augmentin and cannot take Flagyl  due to pill size so she was discharged on moxifloxacin .  She does have a history of A-fib but not on anticoagulation due to history of bleeding ulcers.  Patient reports also having a history of ischemic colitis.  Patient reports having 2 weeks of lower abdominal pain that did not significantly worsen.  She reports the pain is severe.  She reports it starts in her lower abdomen but now goes around her entire stomach.  She reports having to use laxatives to have bowel movements and had a bowel movement yesterday did not take any laxative today.    Physical Exam   Triage Vital Signs: ED Triage Vitals  Encounter Vitals Group     BP 05/02/23 0623 136/87     Systolic BP Percentile --      Diastolic BP Percentile --      Pulse Rate 05/02/23 0623 80     Resp 05/02/23 0623 18     Temp 05/02/23 0623 98.7 F (37.1 C)     Temp Source 05/02/23 0623 Oral     SpO2 05/02/23 0623 95 %     Weight 05/02/23 0621 112 lb (50.8 kg)     Height 05/02/23 0621 5' 2 (1.575 m)     Head Circumference --      Peak Flow --      Pain Score 05/02/23 0621 8     Pain Loc --      Pain Education --      Exclude from Growth Chart --     Most recent vital signs: Vitals:   05/02/23 0623  BP: 136/87  Pulse: 80  Resp: 18  Temp: 98.7 F (37.1 C)  SpO2: 95%     General: Awake, no distress.   CV:  Good peripheral perfusion.  Resp:  Normal effort.  Abd:  No distention.  Tender in the lower abdomen Other:     ED Results / Procedures / Treatments   Labs (all labs ordered are listed, but only abnormal results are displayed) Labs Reviewed  COMPREHENSIVE METABOLIC PANEL - Abnormal; Notable for the following components:      Result Value   Glucose, Bld 115 (*)    GFR, Estimated 56 (*)    All other components within normal limits  LIPASE, BLOOD  CBC  URINALYSIS, ROUTINE W REFLEX MICROSCOPIC     RADIOLOGY I have reviewed the CT personally interpreted and no kidney stones    PROCEDURES:  Critical Care performed: No  Procedures   MEDICATIONS ORDERED IN ED: Medications  fentaNYL  (SUBLIMAZE ) injection 25 mcg (25 mcg Intravenous Given 05/02/23 0901)  ondansetron  (ZOFRAN ) injection 4 mg (4 mg Intravenous Given 05/02/23 0904)  iohexol  (OMNIPAQUE ) 350 MG/ML injection 100 mL (100 mLs Intravenous Contrast Given 05/02/23 0906)     IMPRESSION / MDM / ASSESSMENT AND PLAN / ED COURSE  I reviewed the triage vital signs and the nursing notes.   Patient's presentation is most consistent with acute presentation with potential threat to life or bodily function.   Patient comes in with abdominal pain with a history of ischemic colitis per patient will get CT imaging to evaluate for colitis, diverticulitis or other acute pathology.  She has no chest pain her pain is lower in nature so I doubt that this represents ACS.  No shortness of breath to suggest PE.  Patient be given some pain medication such as IV fentanyl , IV Zofran  and reassess after CT imaging   CBC is reassuring lipase normal CMP reassuring  IMPRESSION: 1. Acute sigmoid diverticulitis. No diverticular abscess or perforation. 2. No CT evidence of mesenteric ischemia. 3.  Aortic Atherosclerosis (ICD10-I70.0).   Considered admission given patient's age but given CT scan is showing uncomplicated diverticulitis and  reassuring blood work I think it be reasonable for patient to go home  Discussed with patient she feels comfortable with discharge home.  Given her allergies and inability to tolerate Flagyl  will start on moxifloxacin  given that with a given her previously.  We discussed liquid diet, bowel rest.  Patient given her first dose of antibiotics, oral pain medication here.  Patient feels comfortable with outpatient trial of treatment.  We did discuss risks of oxycodone  and precautions while using especially given patient is on a benzodiazepine.  Patient expressed understanding but did want something higher than Tylenol  for pain and unable to take ibuprofen  secondary to history of ulcers. Denies any black stools.    FINAL CLINICAL IMPRESSION(S) / ED DIAGNOSES   Final diagnoses:  Diverticulitis     Rx / DC Orders   ED Discharge Orders          Ordered    moxifloxacin  (AVELOX ) 400 MG tablet  Daily        05/02/23 1109    ondansetron  (ZOFRAN -ODT) 4 MG disintegrating tablet  Every 8 hours PRN        05/02/23 1109    oxyCODONE  (ROXICODONE ) 5 MG immediate release tablet  Every 6 hours PRN        05/02/23 1109             Note:  This document was prepared using Dragon voice recognition software and may include unintentional dictation errors.   Ernest Ronal BRAVO, MD 05/02/23 [phone]

## 2023-05-21 DIAGNOSIS — H353124 Nonexudative age-related macular degeneration, left eye, advanced atrophic with subfoveal involvement: Secondary | ICD-10-CM | POA: Diagnosis not present

## 2023-05-28 DIAGNOSIS — I493 Ventricular premature depolarization: Secondary | ICD-10-CM | POA: Diagnosis not present

## 2023-05-28 DIAGNOSIS — I341 Nonrheumatic mitral (valve) prolapse: Secondary | ICD-10-CM | POA: Diagnosis not present

## 2023-05-28 DIAGNOSIS — I10 Essential (primary) hypertension: Secondary | ICD-10-CM | POA: Diagnosis not present

## 2023-05-28 DIAGNOSIS — I25119 Atherosclerotic heart disease of native coronary artery with unspecified angina pectoris: Secondary | ICD-10-CM | POA: Diagnosis not present

## 2023-05-28 DIAGNOSIS — I480 Paroxysmal atrial fibrillation: Secondary | ICD-10-CM | POA: Diagnosis not present

## 2023-07-10 DIAGNOSIS — E782 Mixed hyperlipidemia: Secondary | ICD-10-CM | POA: Diagnosis not present

## 2023-07-16 DIAGNOSIS — H353124 Nonexudative age-related macular degeneration, left eye, advanced atrophic with subfoveal involvement: Secondary | ICD-10-CM | POA: Diagnosis not present

## 2023-07-17 DIAGNOSIS — Z853 Personal history of malignant neoplasm of breast: Secondary | ICD-10-CM | POA: Diagnosis not present

## 2023-07-17 DIAGNOSIS — R69 Illness, unspecified: Secondary | ICD-10-CM | POA: Diagnosis not present

## 2023-07-17 DIAGNOSIS — R413 Other amnesia: Secondary | ICD-10-CM | POA: Diagnosis not present

## 2023-07-17 DIAGNOSIS — Z1331 Encounter for screening for depression: Secondary | ICD-10-CM | POA: Diagnosis not present

## 2023-07-17 DIAGNOSIS — F32A Depression, unspecified: Secondary | ICD-10-CM | POA: Diagnosis not present

## 2023-07-17 DIAGNOSIS — Z0000 Encounter for general adult medical examination without abnormal findings: Secondary | ICD-10-CM | POA: Diagnosis not present

## 2023-07-17 DIAGNOSIS — R531 Weakness: Secondary | ICD-10-CM | POA: Diagnosis not present

## 2023-07-17 DIAGNOSIS — Z79899 Other long term (current) drug therapy: Secondary | ICD-10-CM | POA: Diagnosis not present

## 2023-07-20 ENCOUNTER — Other Ambulatory Visit: Payer: Self-pay | Admitting: Internal Medicine

## 2023-07-20 DIAGNOSIS — I639 Cerebral infarction, unspecified: Secondary | ICD-10-CM

## 2023-07-31 ENCOUNTER — Ambulatory Visit
Admission: RE | Admit: 2023-07-31 | Discharge: 2023-07-31 | Disposition: A | Discharge status: Home / Self Care | Source: Ambulatory Visit | Attending: Internal Medicine | Admitting: Internal Medicine

## 2023-07-31 DIAGNOSIS — R531 Weakness: Secondary | ICD-10-CM | POA: Diagnosis not present

## 2023-07-31 DIAGNOSIS — I6782 Cerebral ischemia: Secondary | ICD-10-CM | POA: Diagnosis not present

## 2023-07-31 DIAGNOSIS — I639 Cerebral infarction, unspecified: Secondary | ICD-10-CM | POA: Insufficient documentation

## 2023-07-31 DIAGNOSIS — G319 Degenerative disease of nervous system, unspecified: Secondary | ICD-10-CM | POA: Diagnosis not present

## 2023-08-13 DIAGNOSIS — F01A0 Vascular dementia, mild, without behavioral disturbance, psychotic disturbance, mood disturbance, and anxiety: Secondary | ICD-10-CM | POA: Diagnosis not present

## 2023-08-13 DIAGNOSIS — J40 Bronchitis, not specified as acute or chronic: Secondary | ICD-10-CM | POA: Diagnosis not present

## 2023-08-15 ENCOUNTER — Inpatient Hospital Stay: Payer: Medicare HMO

## 2023-08-15 ENCOUNTER — Inpatient Hospital Stay: Payer: Medicare HMO | Admitting: Oncology

## 2023-08-15 ENCOUNTER — Encounter: Payer: Self-pay | Admitting: Oncology

## 2023-08-27 DIAGNOSIS — Z961 Presence of intraocular lens: Secondary | ICD-10-CM | POA: Diagnosis not present

## 2023-08-27 DIAGNOSIS — H353124 Nonexudative age-related macular degeneration, left eye, advanced atrophic with subfoveal involvement: Secondary | ICD-10-CM | POA: Diagnosis not present

## 2023-09-03 ENCOUNTER — Other Ambulatory Visit: Payer: Self-pay | Admitting: *Deleted

## 2023-09-03 DIAGNOSIS — D508 Other iron deficiency anemias: Secondary | ICD-10-CM

## 2023-09-04 ENCOUNTER — Inpatient Hospital Stay: Attending: Oncology

## 2023-09-04 ENCOUNTER — Inpatient Hospital Stay (HOSPITAL_BASED_OUTPATIENT_CLINIC_OR_DEPARTMENT_OTHER): Admitting: Oncology

## 2023-09-04 ENCOUNTER — Encounter: Payer: Self-pay | Admitting: Oncology

## 2023-09-04 VITALS — BP 129/52 | HR 56 | Temp 96.0°F | Resp 18 | Ht 62.0 in | Wt 108.4 lb

## 2023-09-04 DIAGNOSIS — D509 Iron deficiency anemia, unspecified: Secondary | ICD-10-CM | POA: Diagnosis not present

## 2023-09-04 DIAGNOSIS — Z801 Family history of malignant neoplasm of trachea, bronchus and lung: Secondary | ICD-10-CM | POA: Diagnosis not present

## 2023-09-04 DIAGNOSIS — Z853 Personal history of malignant neoplasm of breast: Secondary | ICD-10-CM | POA: Diagnosis not present

## 2023-09-04 DIAGNOSIS — Z923 Personal history of irradiation: Secondary | ICD-10-CM | POA: Insufficient documentation

## 2023-09-04 DIAGNOSIS — Z9221 Personal history of antineoplastic chemotherapy: Secondary | ICD-10-CM | POA: Diagnosis not present

## 2023-09-04 DIAGNOSIS — Z800 Family history of malignant neoplasm of digestive organs: Secondary | ICD-10-CM | POA: Diagnosis not present

## 2023-09-04 DIAGNOSIS — Z08 Encounter for follow-up examination after completed treatment for malignant neoplasm: Secondary | ICD-10-CM

## 2023-09-04 DIAGNOSIS — Z87891 Personal history of nicotine dependence: Secondary | ICD-10-CM | POA: Diagnosis not present

## 2023-09-04 DIAGNOSIS — D508 Other iron deficiency anemias: Secondary | ICD-10-CM

## 2023-09-04 LAB — VITAMIN B12: Vitamin B-12: 1014 pg/mL — ABNORMAL HIGH (ref 180–914)

## 2023-09-04 LAB — CBC WITH DIFFERENTIAL (CANCER CENTER ONLY)
Abs Immature Granulocytes: 0.02 10*3/uL (ref 0.00–0.07)
Basophils Absolute: 0 10*3/uL (ref 0.0–0.1)
Basophils Relative: 0 %
Eosinophils Absolute: 0.3 10*3/uL (ref 0.0–0.5)
Eosinophils Relative: 4 %
HCT: 41.3 % (ref 36.0–46.0)
Hemoglobin: 13.8 g/dL (ref 12.0–15.0)
Immature Granulocytes: 0 %
Lymphocytes Relative: 24 %
Lymphs Abs: 1.7 10*3/uL (ref 0.7–4.0)
MCH: 31.2 pg (ref 26.0–34.0)
MCHC: 33.4 g/dL (ref 30.0–36.0)
MCV: 93.2 fL (ref 80.0–100.0)
Monocytes Absolute: 0.6 10*3/uL (ref 0.1–1.0)
Monocytes Relative: 9 %
Neutro Abs: 4.5 10*3/uL (ref 1.7–7.7)
Neutrophils Relative %: 63 %
Platelet Count: 200 10*3/uL (ref 150–400)
RBC: 4.43 MIL/uL (ref 3.87–5.11)
RDW: 13.1 % (ref 11.5–15.5)
WBC Count: 7.2 10*3/uL (ref 4.0–10.5)
nRBC: 0 % (ref 0.0–0.2)

## 2023-09-04 LAB — IRON AND TIBC
Iron: 172 ug/dL — ABNORMAL HIGH (ref 28–170)
Saturation Ratios: 48 % — ABNORMAL HIGH (ref 10.4–31.8)
TIBC: 361 ug/dL (ref 250–450)
UIBC: 189 ug/dL

## 2023-09-04 LAB — FERRITIN: Ferritin: 26 ng/mL (ref 11–307)

## 2023-09-04 NOTE — Progress Notes (Unsigned)
 Hematology/Oncology Consult note [facility]  [phone] [phone]  Patient Care Team: Sari Cunning, MD as PCP - General (Internal Medicine) Avonne Boettcher, MD as Consulting Physician (Oncology) Eldred Grego, MD as Consulting Physician (General Surgery) Burnie Cartwright, RN as Oncology Nurse Navigator Glenis Langdon, MD as Referring Physician (Radiation Oncology)   Name of the patient: Doris Barnes  [id number]  Jul 29, 1940   Date of visit: 09/04/23  Diagnosis- Stage IA invasive mammary carcinoma of the left breast ER negative, PR weakly positive and HER-2 positive  History of iron  deficiency anemia  Chief complaint/ Reason for visit-routine follow-up of breast cancer and iron  deficiency anemia  Heme/Onc history: Patient is a 83 year old female who was diagnosed with invasive mammary carcinoma of the left breast ER negative, PR 11 to 50% positive and HER-2/neu positive.  Tumor was grade 314 mm.  Lymph nodes were negative for malignancy.  She started adjuvant Taxol  Herceptin  chemotherapy in October 2018 and has completed 1 year of adjuvant Herceptin .  She is also completed adjuvant radiation.  She could not tolerate AI and did not wish to continue hormone therapy.    Patient referred for iron  deficiency anemia in October 2023 and received IV iron  for the same.  Interval history- ***  ECOG PS- *** Pain scale- *** Opioid associated constipation- ***  Review of systems- Review of Systems  Constitutional:  Negative for chills, fever, malaise/fatigue and weight loss.  HENT:  Negative for congestion, ear discharge and nosebleeds.   Eyes:  Negative for blurred vision.  Respiratory:  Negative for cough, hemoptysis, sputum production, shortness of breath and wheezing.   Cardiovascular:  Negative for chest pain, palpitations, orthopnea and claudication.  Gastrointestinal:  Negative for abdominal pain, blood in stool,  constipation, diarrhea, heartburn, melena, nausea and vomiting.  Genitourinary:  Negative for dysuria, flank pain, frequency, hematuria and urgency.  Musculoskeletal:  Negative for back pain, joint pain and myalgias.  Skin:  Negative for rash.  Neurological:  Negative for dizziness, tingling, focal weakness, seizures, weakness and headaches.  Endo/Heme/Allergies:  Does not bruise/bleed easily.  Psychiatric/Behavioral:  Negative for depression and suicidal ideas. The patient does not have insomnia.       Allergies  Allergen Reactions  . Imipramine Pamoate Shortness Of Breath  . Bisphosphonates Nausea Only  . Amoxicillin-Pot Clavulanate Rash  . Epinephrine  Hcl (Nasal) Other (See Comments)    Difficulty breathing  . Prednisone Palpitations  . Sulfa Antibiotics Rash    Face turns red and stings     Past Medical History:  Diagnosis Date  . Anemia   . Anxiety   . Asthma   . Breast cancer (HCC) 12/14/2016   left breast  . Cancer (HCC) skin  . Chronic vulvitis   . Colon polyp   . Cystocele   . Diverticulitis   . Diverticulitis   . Diverticulosis   . Diverticulosis   . Dyspareunia, female   . Dyspnea   . Dysrhythmia   . Fibrocystic disease of both breasts   . Gastritis   . GERD (gastroesophageal reflux disease)    gastritis also  . Hemorrhoids   . History of hiatal hernia   . Hypercholesteremia   . Hypertension   . IBS (irritable bowel syndrome)   . IC (interstitial cystitis)   . Migraine   . Mitral valve prolapse   . OP (osteoporosis)   . Personal history of chemotherapy   . Personal history of radiation therapy   .  Positional vertigo   . Rectocele   . Squamous cell carcinoma   . Stroke [facility])    TIA  . Vaginal atrophy      Past Surgical History:  Procedure Laterality Date  . APPENDECTOMY    . BREAST BIOPSY Left 1998   core bx- neg  . BREAST BIOPSY Left 2007   neg  . BREAST BIOPSY Left 12/14/2016   left breast us  core positive  . BREAST EXCISIONAL  BIOPSY Left   . BREAST LUMPECTOMY Left 01/01/2017    INVASIVE MAMMARY CARCINOMA/Grade 3  . CARDIAC CATHETERIZATION N/A 01/18/2015   Procedure: Left Heart Cath and Coronary Angiography;  Surgeon: Ronney Cola, MD;  Location: [facility] INVASIVE CV LAB;  Service: Cardiovascular;  Laterality: N/A;  . CATARACT EXTRACTION W/PHACO Right 12/15/2015   Procedure: CATARACT EXTRACTION PHACO AND INTRAOCULAR LENS PLACEMENT (IOC);  Surgeon: Steven Dingeldein, MD;  Location: [facility] ORS;  Service: Ophthalmology;  Laterality: Right;  US  01:20 AP% 23.9 CDE 35.49 Fluid pack lot # [id number]  . CATARACT EXTRACTION W/PHACO Left 12/29/2015   Procedure: CATARACT EXTRACTION PHACO AND INTRAOCULAR LENS PLACEMENT (IOC);  Surgeon: Steven Dingeldein, MD;  Location: [facility] ORS;  Service: Ophthalmology;  Laterality: Left;  US   01:20 AP% 25.1 CDE 32.86 Fluid pack lot # [id number]  . COLON SURGERY    . COLONOSCOPY WITH PROPOFOL  N/A 02/21/2016   Procedure: COLONOSCOPY WITH PROPOFOL ;  Surgeon: Cassie Click, MD;  Location: [facility] ENDOSCOPY;  Service: Endoscopy;  Laterality: N/A;  . COLONOSCOPY WITH PROPOFOL  N/A 01/03/2019   Procedure: COLONOSCOPY WITH PROPOFOL ;  Surgeon: Deveron Fly, MD;  Location: [facility] ENDOSCOPY;  Service: Endoscopy;  Laterality: N/A;  . CORONARY ANGIOPLASTY    . DILATION AND CURETTAGE OF UTERUS    . ESOPHAGOGASTRODUODENOSCOPY (EGD) WITH PROPOFOL  N/A 11/23/2017   Procedure: ESOPHAGOGASTRODUODENOSCOPY (EGD) WITH PROPOFOL ;  Surgeon: Cassie Click, MD;  Location: [facility] ENDOSCOPY;  Service: Endoscopy;  Laterality: N/A;  . OOPHORECTOMY Bilateral   . PARTIAL MASTECTOMY WITH NEEDLE LOCALIZATION Left 01/01/2017   Procedure: PARTIAL MASTECTOMY WITH NEEDLE LOCALIZATION;  Surgeon: Eldred Grego, MD;  Location: [facility] ORS;  Service: General;  Laterality: Left;  . PORTACATH PLACEMENT Right 01/01/2017   Procedure: INSERTION PORT-A-CATH;  Surgeon: Eldred Grego, MD;  Location: [facility] ORS;  Service: General;   Laterality: Right;  . SENTINEL NODE BIOPSY Left 01/01/2017   Procedure: SENTINEL NODE BIOPSY;  Surgeon: Eldred Grego, MD;  Location: [facility] ORS;  Service: General;  Laterality: Left;  . TONSILLECTOMY    . VAGINAL HYSTERECTOMY      Social History   Socioeconomic History  . Marital status: Married    Spouse name: Not on file  . Number of children: Not on file  . Years of education: Not on file  . Highest education level: Not on file  Occupational History  . Not on file  Tobacco Use  . Smoking status: Former    Current packs/day: 0.00    Average packs/day: 1 pack/day for 33.0 years (33.0 ttl pk-yrs)    Types: Cigarettes    Start date: 10/17/1954    Quit date: 10/17/1987    Years since quitting: 35.9  . Smokeless tobacco: Never  Vaping Use  . Vaping status: Never Used  Substance and Sexual Activity  . Alcohol use: No  . Drug use: No  . Sexual activity: Yes  Other Topics Concern  . Not on file  Social History Narrative  . Not on file   Social Drivers of Corporate investment banker  Strain: Low Risk  (01/09/2023)   Received [facility]   Overall Financial Resource Strain (CARDIA)   . Difficulty of Paying Living Expenses: Not hard at all  Food Insecurity: No Food Insecurity (01/09/2023)   Received from [facility]   Hunger Vital Sign   . Worried About Programme researcher, broadcasting/film/video in the Last Year: Never true   . Ran Out of Food in the Last Year: Never true  Transportation Needs: No Transportation Needs (01/09/2023)   Received from [facility]   [facility] - Transportation   . In the past 12 months, has lack of transportation kept you from medical appointments or from getting medications?: No   . Lack of Transportation (Non-Medical): No  Physical Activity: Not on file  Stress: Not on file  Social Connections: Not on file  Intimate Partner Violence: Not At Risk (12/07/2022)   Humiliation, Afraid, Rape, and Kick  questionnaire   . Fear of Current or Ex-Partner: No   . Emotionally Abused: No   . Physically Abused: No   . Sexually Abused: No    Family History  Problem Relation Age of Onset  . Diabetes Mother   . Diabetes Father   . Colon cancer Sister   . Diabetes Sister   . Diabetes Maternal Aunt   . Colon cancer Paternal Grandfather   . Lung cancer Brother   . Breast cancer Neg Hx   . Ovarian cancer Neg Hx      Current Outpatient Medications:  .  CONSTULOSE 10 GM/15ML solution, , Disp: , Rfl:  .  donepezil (ARICEPT) 5 MG tablet, Take 5 mg by mouth daily., Disp: , Rfl:  .  fluticasone -salmeterol (ADVAIR) 100-50 MCG/ACT AEPB, Inhale 1 puff into the lungs., Disp: , Rfl:  .  hydrocortisone  2.5 % cream, Apply topically., Disp: , Rfl:  .  lactulose, encephalopathy, (CHRONULAC) 10 GM/15ML SOLN, Take 30 mLs by mouth., Disp: , Rfl:  .  acetaminophen  (TYLENOL ) 325 MG tablet, Take 650 mg by mouth every 6 (six) hours as needed for headache., Disp: , Rfl:  .  ALPRAZolam  (XANAX ) 0.25 MG tablet, Take 0.25 mg by mouth at bedtime as needed for anxiety., Disp: , Rfl:  .  Calcium Carbonate-Vitamin D 600-400 MG-UNIT tablet, Take 1 tablet by mouth 2 (two) times daily., Disp: , Rfl:  .  dibucaine (NUPERCAINAL) 1 % OINT, Place 1 application rectally as needed for hemorrhoids., Disp: 28 g, Rfl: 0 .  famotidine  (PEPCID ) 40 MG tablet, Take 40 mg by mouth at bedtime., Disp: , Rfl:  .  FLUoxetine  (PROZAC ) 40 MG capsule, Take 40 mg by mouth daily., Disp: , Rfl:  .  furosemide (LASIX) 20 MG tablet, Take 20 mg by mouth daily., Disp: , Rfl:  .  galantamine (RAZADYNE) 4 MG tablet, Take by mouth., Disp: , Rfl:  .  lansoprazole (PREVACID) 30 MG capsule, Take 30 mg by mouth daily before breakfast. , Disp: , Rfl:  .  loratadine  (CLARITIN ) 10 MG tablet, Take 10 mg by mouth daily., Disp: , Rfl:  .  Magnesium  250 MG TABS, Take 250 mg by mouth daily., Disp: , Rfl:  .  mirtazapine (REMERON) 7.5 MG tablet, Take 7.5 mg by mouth at  bedtime., Disp: , Rfl:  .  montelukast (SINGULAIR) 10 MG tablet, Take 10 mg by mouth daily., Disp: , Rfl:  .  Multiple Vitamins-Minerals (PRESERVISION AREDS 2) CAPS, Take 1 capsule by mouth 2 (two) times daily., Disp: , Rfl:  .  POTASSIUM PO, Take 1,000 mg by mouth daily., Disp: , Rfl:  .  propranolol (INDERAL) 40 MG tablet, Take 40 mg by mouth daily., Disp: , Rfl:  .  senna-docusate (SENOKOT-S) 8.6-50 MG tablet, Take 2 tablets by mouth every evening., Disp: , Rfl:  .  simvastatin  (ZOCOR ) 20 MG tablet, Take 20 mg by mouth daily at 8 pm., Disp: , Rfl:  .  spironolactone (ALDACTONE) 25 MG tablet, Take 25 mg by mouth daily., Disp: , Rfl:   Physical exam: There were no vitals filed for this visit. Physical Exam Cardiovascular:     Rate and Rhythm: Normal rate and regular rhythm.     Heart sounds: Normal heart sounds.  Pulmonary:     Effort: Pulmonary effort is normal.     Breath sounds: Normal breath sounds.  Abdominal:     General: Bowel sounds are normal.     Palpations: Abdomen is soft.  Skin:    General: Skin is warm and dry.  Neurological:     Mental Status: She is alert and oriented to person, place, and time.   Breast exam was performed in seated and lying down position. Patient is status post left lumpectomy with a well-healed surgical scar. No evidence of any palpable masses. No evidence of axillary adenopathy. No evidence of any palpable masses or lumps in the right breast. No evidence of right axillary adenopathy   I have personally reviewed labs listed below:    Latest Ref Rng & Units 05/02/2023    6:23 AM  CMP  Glucose 70 - 99 mg/dL 413   BUN 8 - 23 mg/dL 17   Creatinine 2.44 - 1.00 mg/dL 0.10   Sodium 272 - 536 mmol/L 137   Potassium 3.5 - 5.1 mmol/L 4.4   Chloride 98 - 111 mmol/L 100   CO2 22 - 32 mmol/L 27   Calcium 8.9 - 10.3 mg/dL 9.6   Total Protein 6.5 - 8.1 g/dL 7.7   Total Bilirubin 0.0 - 1.2 mg/dL 0.5   Alkaline Phos 38 - 126 U/L 88   AST 15 - 41 U/L 20    ALT 0 - 44 U/L 17       Latest Ref Rng & Units 05/02/2023    6:23 AM  CBC  WBC 4.0 - 10.5 K/uL 9.6   Hemoglobin 12.0 - 15.0 g/dL 64.4   Hematocrit 03.4 - 46.0 % 43.9   Platelets 150 - 400 K/uL 283       Assessment and plan- Patient is a 83 y.o. female with prior history of stage I ER negative weakly PR positive HER2 positive stage I left breast cancer in 2018 s/p adjuvant chemotherapy and radiation treatment here for a routine follow-up  Clinically patient Is doing well with no concerning signs and symptoms of recurrence based on today's exam.  Her mammogram from November 2024 was unremarkable.  I will see her back in 1 year.  With regards to history of iron  deficiency anemia: Her hemoglobin today is normal at 13.8 with a hematocrit of 41.3.  Iron  studies and B12 levels are currently pending.  Labs and see me in 1 year   Visit Diagnosis 1. Encounter for follow-up surveillance of breast cancer      Dr. Seretha Dance, MD, MPH [facility] [phone] 09/04/2023 3:12 PM

## 2023-09-05 ENCOUNTER — Encounter: Payer: Self-pay | Admitting: Oncology

## 2023-09-10 DIAGNOSIS — H353124 Nonexudative age-related macular degeneration, left eye, advanced atrophic with subfoveal involvement: Secondary | ICD-10-CM | POA: Diagnosis not present

## 2023-11-05 DIAGNOSIS — H353124 Nonexudative age-related macular degeneration, left eye, advanced atrophic with subfoveal involvement: Secondary | ICD-10-CM | POA: Diagnosis not present

## 2024-01-01 DIAGNOSIS — H353124 Nonexudative age-related macular degeneration, left eye, advanced atrophic with subfoveal involvement: Secondary | ICD-10-CM | POA: Diagnosis not present

## 2024-01-22 DIAGNOSIS — Z79899 Other long term (current) drug therapy: Secondary | ICD-10-CM | POA: Diagnosis not present

## 2024-01-22 DIAGNOSIS — R7989 Other specified abnormal findings of blood chemistry: Secondary | ICD-10-CM | POA: Diagnosis not present

## 2024-01-22 DIAGNOSIS — R739 Hyperglycemia, unspecified: Secondary | ICD-10-CM | POA: Diagnosis not present

## 2024-01-29 DIAGNOSIS — Z79899 Other long term (current) drug therapy: Secondary | ICD-10-CM | POA: Diagnosis not present

## 2024-01-29 DIAGNOSIS — F03A3 Unspecified dementia, mild, with mood disturbance: Secondary | ICD-10-CM | POA: Diagnosis not present

## 2024-01-29 DIAGNOSIS — Z0000 Encounter for general adult medical examination without abnormal findings: Secondary | ICD-10-CM | POA: Diagnosis not present

## 2024-01-29 DIAGNOSIS — M810 Age-related osteoporosis without current pathological fracture: Secondary | ICD-10-CM | POA: Diagnosis not present

## 2024-01-29 DIAGNOSIS — Z1331 Encounter for screening for depression: Secondary | ICD-10-CM | POA: Diagnosis not present

## 2024-01-30 ENCOUNTER — Other Ambulatory Visit: Payer: Self-pay | Admitting: Internal Medicine

## 2024-01-30 DIAGNOSIS — Z1231 Encounter for screening mammogram for malignant neoplasm of breast: Secondary | ICD-10-CM

## 2024-01-30 NOTE — Result Encounter Note (Signed)
 Bone density shows moderate osteoporosis, moderately declined from 5 years ago.  She would benefit greatly from IV Reclast , and annual infusion to really protect her bones, done by endocrinology  I do not think oral Fosamax is advised with her history of gastritis/esophagitis See if patient would be interested in this-to reduce chance of fractures down the road

## 2024-02-28 DIAGNOSIS — H353124 Nonexudative age-related macular degeneration, left eye, advanced atrophic with subfoveal involvement: Secondary | ICD-10-CM | POA: Diagnosis not present

## 2024-02-28 DIAGNOSIS — H353112 Nonexudative age-related macular degeneration, right eye, intermediate dry stage: Secondary | ICD-10-CM | POA: Diagnosis not present

## 2024-03-02 ENCOUNTER — Emergency Department
Admission: EM | Admit: 2024-03-02 | Discharge: 2024-03-02 | Disposition: A | Discharge status: Home / Self Care | Attending: Emergency Medicine | Admitting: Emergency Medicine

## 2024-03-02 ENCOUNTER — Other Ambulatory Visit: Payer: Self-pay

## 2024-03-02 DIAGNOSIS — L509 Urticaria, unspecified: Secondary | ICD-10-CM | POA: Diagnosis not present

## 2024-03-02 DIAGNOSIS — Z853 Personal history of malignant neoplasm of breast: Secondary | ICD-10-CM | POA: Diagnosis not present

## 2024-03-02 DIAGNOSIS — L500 Allergic urticaria: Secondary | ICD-10-CM | POA: Diagnosis not present

## 2024-03-02 DIAGNOSIS — I10 Essential (primary) hypertension: Secondary | ICD-10-CM | POA: Diagnosis not present

## 2024-03-02 MED ORDER — LORATADINE 10 MG PO TABS
10.0000 mg | ORAL_TABLET | Freq: Every day | ORAL | Status: AC
  Administered 2024-03-02: 10 mg via ORAL
  Filled 2024-03-02: qty 1

## 2024-03-02 MED ORDER — LORATADINE 10 MG PO TABS: 10.0000 mg | ORAL_TABLET | Freq: Every day | ORAL | Status: DC

## 2024-03-02 MED ORDER — CETIRIZINE HCL 10 MG PO TABS: 10.0000 mg | ORAL_TABLET | Freq: Two times a day (BID) | ORAL | 2 refills | Status: AC | PRN

## 2024-03-02 NOTE — ED Triage Notes (Signed)
 Patient brought in by husband for pruritus to back, stomach, feet since Wednesday. Patient taking hydroxyzine  prescribed by PCP which helps for 3-4 hours. Symptoms woke patient up at 0200. Extensive areas of redness and broken skin from patient scratching. Patient AxOx4, ambulatory with steady gait.

## 2024-03-02 NOTE — ED Provider Notes (Signed)
 [facility] Provider Note    Event Date/Time   First MD Initiated Contact with Patient 03/02/24 [phone]     (approximate)   History   Pruritis   HPI  Doris Barnes is a 83 y.o. female   Past medical history of breast cancer, GERD, hypertension hyperlipidemia, here with skin rash.  It is affecting her back, trunk, chest, bilateral arms and legs but not the hands and feet, sparing the neck and face and no mucosal membrane involvement.  No other allergic symptoms like shortness of breath wheezing or abdominal discomfort.  New exposures include a new laundry detergent started last week.  No new medications or antibiotic use recently.  No recent travel.  No significant time outdoors.  Was prescribed hydroxyzine  by her primary doctor and has been working very well for the itchy rash however it only last about 4 to 6 hours before it becomes itchy again.  Independent Historian contributed to assessment above: Husband corroborates information above  External Medical Documents Reviewed: Previous outpatient notes.      Physical Exam   Triage Vital Signs: ED Triage Vitals [03/02/24 0336]  Encounter Vitals Group     BP (!) 150/64     Girls Systolic BP Percentile      Girls Diastolic BP Percentile      Boys Systolic BP Percentile      Boys Diastolic BP Percentile      Pulse Rate 69     Resp 18     Temp 97.6 F (36.4 C)     Temp Source Oral     SpO2 96 %     Weight 104 lb (47.2 kg)     Height 5' 2 (1.575 m)     Head Circumference      Peak Flow      Pain Score 0     Pain Loc      Pain Education      Exclude from Growth Chart     Most recent vital signs: Vitals:   03/02/24 0336  BP: (!) 150/64  Pulse: 69  Resp: 18  Temp: 97.6 F (36.4 C)  SpO2: 96%    General: Awake, no distress.  CV:  Good peripheral perfusion.  Resp:  Normal effort.  Abd:  No distention.  Other:  Pleasant woman in no acute distress.  Slightly hypertensive otherwise  vital signs normal, afebrile.  Nontoxic comfortable appearing patient.  She does have a rash limited to her bilateral upper and lower extremities, trunk, back but sparing the face neck mucosal membranes in hands and feet.  It is erythematous, slightly raised, and she has excoriations on her back from her scratching.  No infectious changes.  No bulla.   ED Results / Procedures / Treatments   Labs (all labs ordered are listed, but only abnormal results are displayed) Labs Reviewed - No data to display  PROCEDURES:  Critical Care performed: No  Procedures   MEDICATIONS ORDERED IN ED: Medications  loratadine  (CLARITIN ) tablet 10 mg (has no administration in time range)    IMPRESSION / MDM / ASSESSMENT AND PLAN / ED COURSE  I reviewed the triage vital signs and the nursing notes.                                Patient's presentation is most consistent with acute presentation with potential threat to life or bodily function.  Differential diagnosis  includes, but is not limited to, contact dermatitis, urticaria, viral exanthem, insect bite or scabies, considered emergency dermatology conditions like SJS/TENS/infection   MDM:    Likely contact dermatitis from her new laundry detergent given the distribution only where her close would be affecting, responsive to antihistamine.  No evidence of anaphylaxis or emergency dermatologic conditions like SJS or TENS, does not appear infected, did not appear to be poison ivy or insect/scabies.  Will have her take second-generation antihistamine to avoid side effect profile of more sedating antihistamines, and remove the likely offending agent.  She will follow-up with PMD and has an appointment for allergist in January.  Appropriate for discharge       FINAL CLINICAL IMPRESSION(S) / ED DIAGNOSES   Final diagnoses:  Hives     Rx / DC Orders   ED Discharge Orders          Ordered    cetirizine  (ZYRTEC  ALLERGY) 10 MG tablet  2 times  daily PRN        03/02/24 0536             Note:  This document was prepared using Dragon voice recognition software and may include unintentional dictation errors.    Cyrena Mylar, MD 03/02/24 [phone]

## 2024-03-02 NOTE — Discharge Instructions (Signed)
 In case this is an allergic reaction to a new larger detergent, discontinue the use of the new laundry detergent and use what you are using previously.  Discontinue the use of your hydroxyzine  and instead take the cetirizine  antihistamine as prescribed.  Thank you for choosing us  for your health care today!  Please see your primary doctor this week for a follow up appointment.   If you have any new, worsening, or unexpected symptoms call your doctor right away or come back to the emergency department for reevaluation.  It was my pleasure to care for you today.   Ginnie EDISON Cyrena, MD

## 2024-03-07 ENCOUNTER — Encounter

## 2024-04-10 ENCOUNTER — Encounter: Payer: Self-pay | Admitting: Oncology

## 2024-04-17 ENCOUNTER — Encounter

## 2024-04-28 ENCOUNTER — Encounter: Payer: Self-pay | Admitting: Oncology

## 2024-04-28 ENCOUNTER — Ambulatory Visit
Admission: RE | Admit: 2024-04-28 | Discharge: 2024-04-28 | Disposition: A | Discharge status: Home / Self Care | Source: Ambulatory Visit | Attending: Internal Medicine | Admitting: Internal Medicine

## 2024-04-28 DIAGNOSIS — Z1231 Encounter for screening mammogram for malignant neoplasm of breast: Secondary | ICD-10-CM
